# Patient Record
Sex: Male | Born: 1965 | Race: White | Hispanic: No | Marital: Married | Smoking: Never smoker
Health system: Southern US, Community
[De-identification: ages and names within clinical notes are randomized; demographics above are authoritative.]

## PROBLEM LIST (undated history)

## (undated) ENCOUNTER — Emergency Department (HOSPITAL_COMMUNITY): Discharge status: Absent without leave | Payer: Self-pay

## (undated) DIAGNOSIS — T4145XA Adverse effect of unspecified anesthetic, initial encounter: Secondary | ICD-10-CM

## (undated) DIAGNOSIS — J4540 Moderate persistent asthma, uncomplicated: Secondary | ICD-10-CM

## (undated) DIAGNOSIS — Z87442 Personal history of urinary calculi: Secondary | ICD-10-CM

## (undated) DIAGNOSIS — T8859XA Other complications of anesthesia, initial encounter: Secondary | ICD-10-CM

## (undated) DIAGNOSIS — J309 Allergic rhinitis, unspecified: Secondary | ICD-10-CM

## (undated) DIAGNOSIS — H409 Unspecified glaucoma: Secondary | ICD-10-CM

## (undated) DIAGNOSIS — G4733 Obstructive sleep apnea (adult) (pediatric): Secondary | ICD-10-CM

## (undated) DIAGNOSIS — J449 Chronic obstructive pulmonary disease, unspecified: Secondary | ICD-10-CM

## (undated) DIAGNOSIS — T7840XA Allergy, unspecified, initial encounter: Secondary | ICD-10-CM

## (undated) DIAGNOSIS — N201 Calculus of ureter: Secondary | ICD-10-CM

## (undated) DIAGNOSIS — T783XXA Angioneurotic edema, initial encounter: Secondary | ICD-10-CM

## (undated) DIAGNOSIS — M1990 Unspecified osteoarthritis, unspecified site: Secondary | ICD-10-CM

## (undated) DIAGNOSIS — L509 Urticaria, unspecified: Secondary | ICD-10-CM

## (undated) DIAGNOSIS — C801 Malignant (primary) neoplasm, unspecified: Secondary | ICD-10-CM

## (undated) DIAGNOSIS — R7303 Prediabetes: Secondary | ICD-10-CM

## (undated) DIAGNOSIS — Z8709 Personal history of other diseases of the respiratory system: Secondary | ICD-10-CM

## (undated) HISTORY — DX: Malignant (primary) neoplasm, unspecified: C80.1

## (undated) HISTORY — DX: Urticaria, unspecified: L50.9

## (undated) HISTORY — DX: Allergy, unspecified, initial encounter: T78.40XA

## (undated) HISTORY — DX: Angioneurotic edema, initial encounter: T78.3XXA

## (undated) HISTORY — DX: Unspecified glaucoma: H40.9

## (undated) HISTORY — PX: SINOSCOPY: SHX187

---
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
## 1992-07-22 DIAGNOSIS — J189 Pneumonia, unspecified organism: Secondary | ICD-10-CM

## 1992-07-22 HISTORY — PX: WISDOM TOOTH EXTRACTION: SHX21

## 1992-07-22 HISTORY — DX: Pneumonia, unspecified organism: J18.9

## 2002-03-09 ENCOUNTER — Emergency Department (HOSPITAL_COMMUNITY): Admission: EM | Admit: 2002-03-09 | Discharge: 2002-03-09 | Payer: Self-pay | Admitting: Emergency Medicine

## 2002-03-09 ENCOUNTER — Encounter: Payer: Self-pay | Admitting: Emergency Medicine

## 2002-03-10 ENCOUNTER — Ambulatory Visit (HOSPITAL_BASED_OUTPATIENT_CLINIC_OR_DEPARTMENT_OTHER): Admission: RE | Admit: 2002-03-10 | Discharge: 2002-03-10 | Payer: Self-pay | Admitting: Urology

## 2002-03-10 HISTORY — PX: OTHER SURGICAL HISTORY: SHX169

## 2002-03-18 ENCOUNTER — Encounter: Payer: Self-pay | Admitting: Urology

## 2002-03-18 ENCOUNTER — Ambulatory Visit (HOSPITAL_BASED_OUTPATIENT_CLINIC_OR_DEPARTMENT_OTHER): Admission: RE | Admit: 2002-03-18 | Discharge: 2002-03-18 | Payer: Self-pay | Admitting: Urology

## 2004-07-22 HISTORY — PX: EXTRACORPOREAL SHOCK WAVE LITHOTRIPSY: SHX1557

## 2004-10-02 ENCOUNTER — Ambulatory Visit: Payer: Self-pay | Admitting: Internal Medicine

## 2004-10-04 ENCOUNTER — Ambulatory Visit (HOSPITAL_COMMUNITY): Admission: RE | Admit: 2004-10-04 | Discharge: 2004-10-04 | Payer: Self-pay | Admitting: Urology

## 2004-10-09 ENCOUNTER — Emergency Department (HOSPITAL_COMMUNITY): Admission: EM | Admit: 2004-10-09 | Discharge: 2004-10-09 | Payer: Self-pay | Admitting: Emergency Medicine

## 2005-11-11 ENCOUNTER — Ambulatory Visit: Payer: Self-pay | Admitting: Internal Medicine

## 2006-05-31 ENCOUNTER — Emergency Department (HOSPITAL_COMMUNITY): Admission: EM | Admit: 2006-05-31 | Discharge: 2006-05-31 | Payer: Self-pay | Admitting: Emergency Medicine

## 2007-02-19 ENCOUNTER — Ambulatory Visit: Payer: Self-pay | Admitting: Internal Medicine

## 2007-03-06 ENCOUNTER — Ambulatory Visit: Payer: Self-pay | Admitting: Internal Medicine

## 2007-03-06 LAB — PULMONARY FUNCTION TEST

## 2007-07-13 ENCOUNTER — Telehealth (INDEPENDENT_AMBULATORY_CARE_PROVIDER_SITE_OTHER): Payer: Self-pay | Admitting: *Deleted

## 2007-08-24 ENCOUNTER — Telehealth: Payer: Self-pay | Admitting: Internal Medicine

## 2007-08-25 DIAGNOSIS — H1045 Other chronic allergic conjunctivitis: Secondary | ICD-10-CM | POA: Insufficient documentation

## 2007-08-25 DIAGNOSIS — J3089 Other allergic rhinitis: Secondary | ICD-10-CM | POA: Insufficient documentation

## 2007-08-25 DIAGNOSIS — J449 Chronic obstructive pulmonary disease, unspecified: Secondary | ICD-10-CM | POA: Insufficient documentation

## 2007-08-25 DIAGNOSIS — J302 Other seasonal allergic rhinitis: Secondary | ICD-10-CM

## 2007-11-25 ENCOUNTER — Ambulatory Visit: Payer: Self-pay | Admitting: Internal Medicine

## 2008-03-30 ENCOUNTER — Telehealth: Payer: Self-pay | Admitting: Internal Medicine

## 2008-03-30 ENCOUNTER — Ambulatory Visit: Payer: Self-pay | Admitting: Internal Medicine

## 2008-03-30 LAB — CONVERTED CEMR LAB
BUN: 17 mg/dL (ref 6–23)
Basophils Absolute: 0.1 10*3/uL (ref 0.0–0.1)
Basophils Relative: 0.8 % (ref 0.0–3.0)
CO2: 30 meq/L (ref 19–32)
Calcium: 9.2 mg/dL (ref 8.4–10.5)
Chloride: 109 meq/L (ref 96–112)
Creatinine, Ser: 1.1 mg/dL (ref 0.4–1.5)
Eosinophils Absolute: 0.3 10*3/uL (ref 0.0–0.7)
Eosinophils Relative: 4 % (ref 0.0–5.0)
GFR calc Af Amer: 94 mL/min
GFR calc non Af Amer: 78 mL/min
Glucose, Bld: 103 mg/dL — ABNORMAL HIGH (ref 70–99)
HCT: 46.3 % (ref 39.0–52.0)
Hemoglobin: 15.7 g/dL (ref 13.0–17.0)
Lymphocytes Relative: 40.6 % (ref 12.0–46.0)
MCHC: 33.8 g/dL (ref 30.0–36.0)
MCV: 87.6 fL (ref 78.0–100.0)
Monocytes Absolute: 0.8 10*3/uL (ref 0.1–1.0)
Monocytes Relative: 10 % (ref 3.0–12.0)
Neutro Abs: 3.8 10*3/uL (ref 1.4–7.7)
Neutrophils Relative %: 44.6 % (ref 43.0–77.0)
Platelets: 254 10*3/uL (ref 150–400)
Potassium: 4 meq/L (ref 3.5–5.1)
Pro B Natriuretic peptide (BNP): 9 pg/mL (ref 0.0–100.0)
RBC: 5.28 M/uL (ref 4.22–5.81)
RDW: 13.2 % (ref 11.5–14.6)
Sodium: 142 meq/L (ref 135–145)
WBC: 8.4 10*3/uL (ref 4.5–10.5)

## 2008-09-12 ENCOUNTER — Telehealth (INDEPENDENT_AMBULATORY_CARE_PROVIDER_SITE_OTHER): Payer: Self-pay | Admitting: *Deleted

## 2008-12-30 ENCOUNTER — Telehealth (INDEPENDENT_AMBULATORY_CARE_PROVIDER_SITE_OTHER): Payer: Self-pay | Admitting: *Deleted

## 2009-02-02 ENCOUNTER — Ambulatory Visit: Payer: Self-pay | Admitting: Internal Medicine

## 2009-08-11 ENCOUNTER — Telehealth (INDEPENDENT_AMBULATORY_CARE_PROVIDER_SITE_OTHER): Payer: Self-pay | Admitting: *Deleted

## 2009-09-12 ENCOUNTER — Ambulatory Visit: Payer: Self-pay | Admitting: Internal Medicine

## 2009-09-20 LAB — CONVERTED CEMR LAB
Basophils Absolute: 0.1 10*3/uL (ref 0.0–0.1)
Basophils Relative: 0.9 % (ref 0.0–3.0)
Eosinophils Absolute: 0.8 10*3/uL — ABNORMAL HIGH (ref 0.0–0.7)
Hemoglobin: 15.8 g/dL (ref 13.0–17.0)
MCHC: 33 g/dL (ref 30.0–36.0)
MCV: 90.4 fL (ref 78.0–100.0)
Monocytes Absolute: 0.8 10*3/uL (ref 0.1–1.0)
Neutro Abs: 4.6 10*3/uL (ref 1.4–7.7)
Neutrophils Relative %: 50.8 % (ref 43.0–77.0)
RBC: 5.31 M/uL (ref 4.22–5.81)
RDW: 13.3 % (ref 11.5–14.6)

## 2009-10-12 ENCOUNTER — Ambulatory Visit: Payer: Self-pay | Admitting: Internal Medicine

## 2009-11-23 ENCOUNTER — Ambulatory Visit: Payer: Self-pay | Admitting: Cardiology

## 2009-11-23 ENCOUNTER — Ambulatory Visit: Payer: Self-pay | Admitting: Internal Medicine

## 2009-11-24 ENCOUNTER — Telehealth: Payer: Self-pay | Admitting: Internal Medicine

## 2009-11-24 LAB — CONVERTED CEMR LAB
Basophils Absolute: 0.1 10*3/uL (ref 0.0–0.1)
Eosinophils Absolute: 0.7 10*3/uL (ref 0.0–0.7)
IgE (Immunoglobulin E), Serum: 112.1 intl units/mL (ref 0.0–180.0)
Lymphocytes Relative: 24.7 % (ref 12.0–46.0)
MCHC: 33.8 g/dL (ref 30.0–36.0)
Monocytes Relative: 10 % (ref 3.0–12.0)
Neutrophils Relative %: 57.6 % (ref 43.0–77.0)
RDW: 14.7 % — ABNORMAL HIGH (ref 11.5–14.6)

## 2009-12-04 ENCOUNTER — Telehealth: Payer: Self-pay | Admitting: Internal Medicine

## 2009-12-26 ENCOUNTER — Ambulatory Visit: Payer: Self-pay | Admitting: Internal Medicine

## 2009-12-26 LAB — CONVERTED CEMR LAB: IgE (Immunoglobulin E), Serum: 118.7 intl units/mL (ref 0.0–180.0)

## 2010-01-04 ENCOUNTER — Encounter: Payer: Self-pay | Admitting: Internal Medicine

## 2010-02-19 ENCOUNTER — Telehealth (INDEPENDENT_AMBULATORY_CARE_PROVIDER_SITE_OTHER): Payer: Self-pay | Admitting: *Deleted

## 2010-02-21 ENCOUNTER — Encounter: Payer: Self-pay | Admitting: Internal Medicine

## 2010-08-17 ENCOUNTER — Encounter: Payer: Self-pay | Admitting: Internal Medicine

## 2010-08-23 NOTE — Progress Notes (Signed)
Summary: Cefdinir fo sinusitis  Phone Note Outgoing Call   Summary of Call: Based on CT sinus, i am adding cefdinir to med list Initial call taken by: Waymon Budge MD,  Nov 24, 2009 1:26 PM    New/Updated Medications: CEFDINIR 300 MG CAPS (CEFDINIR) 2 daily x 10 days Prescriptions: CEFDINIR 300 MG CAPS (CEFDINIR) 2 daily x 10 days  #20 x 0   Entered and Authorized by:   Waymon Budge MD   Signed by:   Waymon Budge MD on 11/24/2009   Method used:   Historical   RxID:   [id number]   Appended Document: Cefdinir fo sinusitis    Clinical Lists Changes  Medications: Changed medication from CEFDINIR 300 MG CAPS (CEFDINIR) 2 daily x 10 days to CEFDINIR 300 MG CAPS (CEFDINIR) 2 daily x 10 days - Signed Rx of CEFDINIR 300 MG CAPS (CEFDINIR) 2 daily x 10 days;  #20 x 0;  Signed;  Entered by: Randell Loop CMA;  Authorized by: Waymon Budge MD;  Method used: [facility] [phone]*, [address] [place], [place], [place]  [address], Ph: [phone], Fax: [phone]    Prescriptions: CEFDINIR 300 MG CAPS (CEFDINIR) 2 daily x 10 days  #20 x 0   Entered by:   Randell Loop CMA   Authorized by:   Waymon Budge MD   Signed by:   Randell Loop CMA on 11/24/2009   Method used:   [facility] to        [facility] [address] [phone]* (retail)       [address]       [place], [place]  [address]       Ph: [phone]       Fax: [phone]   RxID:   [id number]

## 2010-08-23 NOTE — Progress Notes (Signed)
Summary: sob- prednisone  Phone Note Call from Patient Call back at Work Phone [phone]   Caller: Patient Call For: young Reason for Call: Talk to Nurse Summary of Call: pt says he is having a lot of difficulty breathing in the last 2 weeks.  CDY took him off some of his meds and now he's using inhalers more often.  Would like nurse to call him back to discuss. Initial call taken by: Eugene Gavia,  Dec 04, 2009 4:39 PM  Follow-up for Phone Call        The pt says he finished the Cefdinir on 12/03/2009. He is taking the Theophylline 2 two times a day. He is using the Proair daily and has already had to use 3 times today. Increased SOB, wheezing and increased cough with yellow mucus production. Has not used nebulizer. Please advise.  Cell # [id number] Allergies (verified):  1)  ! * Advair Follow-up by: Michel Bickers CMA,  Dec 04, 2009 4:52 PM  Additional Follow-up for Phone Call Additional follow up Details #1::        Appt Scheduled Today    Additional Follow-up for Phone Call Additional follow up Details #2::    I called. he is having wheeze, chest tightness a nd cough. Tolerating theophylline two times a day. No chest pain, but cough hurting his back. Denies any swelling, cramps, tightness or leg discomfort.  Coughs up occasional "chunk of yellow". We discussed CT evidence of chronic sinusitis, normal WBC, increased Eos. Consider allergic fungal sinusitis. Will put him back on prednisone til next visit, and increase theophylline to 100 mg three times a day. Ok to use nebulizer if needed. sent script prednisone 10 mg, # 50, 2 daily til feeling better, then one daily til next ov. Follow-up by: Waymon Budge MD,  Dec 04, 2009 5:31 PM  New/Updated Medications: PREDNISONE 10 MG TABS (PREDNISONE) 2 daily until feeling better, then one daily Prescriptions: PREDNISONE 10 MG TABS (PREDNISONE) 2 daily until feeling better, then one daily  #50 x 0   Entered and Authorized by:   Waymon Budge MD   Signed by:   Waymon Budge MD on 12/04/2009   Method used:   [facility]        [facility] [address] [phone]* (retail)       [address]       [place], [place]  [address]       Ph: [phone]       Fax: [phone]   RxID:   [id number]

## 2010-08-23 NOTE — Assessment & Plan Note (Signed)
Summary: 1 month/apc   Primary Provider/Referring Provider:  Manson [facility] FP  CC:  1 month follow up visit.  History of Present Illness: September 12, 2009- Allergic conjunctivitis, allergic rhinitis, asthma Increased cough and wheeze since Thanksgiving. Dyspnea on stairs. Mid-sternal tightness, with no pain except right back sore from coughing., Took Z pak after Thanksgiving. We sent augmentin 3 weeks ago that seemed to help temporarily. No fever. Some dark yellow but phleg m mostly lighter.No edema. Has continued Symbicort. Proair doesn't help much. Used father's nebulizer with albuterol twice daily- gives some relief.Few cramps in toes but no heaviness, calf pain. Refers to significant family stress.  October 12, 2009- Allergic rhinitis/ conjunctivitis, asthma Remains hoarse since Thanksgiving. Treatment here last time made him much better, resolving anosmia. Benefit only lasted 2 weeks and then chest congestion and anosmia have been gradually returnng. Cough plugs and phlegm- yellow now after geener last week. Cough keeps him awake.. Not much sneeze, nasal congestion Denies fever, glands. Throat sore from drainage. Minds hoarseness- wants to sing. This didn't improve when he was feeling better. Denies reflux/ heartburn.   Nov 23, 2009- Allergic rhinoconjnctivitis, asthma Cough is less productive, still some light yellow.  Voice quality is better. Prednisone helped- aware of weight gain- last dose taken a week ago. it also improved his sense of taste and smell.. Pollen bothers his chest, but denies more than usual amount of sneeze, rhinorhea, itching. Not aware of increased sinus drip, or of any reflux. Symbicort throat irritation was discussed- he says without it at night he wheezes and can't sleep.  Current Medications (verified): 1)  Singulair 10 Mg  Tabs (Montelukast Sodium) .... Take 1 Tablet By Mouth Once A Day 2)  Flonase 50 Mcg/act  Susp (Fluticasone Propionate) .[name].. 1 Spray Each Nostirl  Once Daily 3)  Proair Hfa 108 (90 Base) Mcg/act  Aers (Albuterol Sulfate) .... 2 Puffs Four Times A Day As Needed 4)  Symbicort 160-4.5 Mcg/act [name] (Budesonide-Formoterol Fumarate) .[name].. 1 Puff and Rinse, Daily 5)  Albuterol Sulfate (2.5 Mg/11ml) 0.083% Nebu (Albuterol Sulfate) .... Two Times A Day  Allergies (verified): 1)  ! * Advair  Past History:  Past Medical History: Last updated: 02/02/2009 ALLERGIC CONJUNCTIVITIS (ICD-372.14) ASTHMA, INTERMITTENT, MILD (ICD-493.90) ALLERGIC RHINITIS (ICD-477.9)  Kidney Stone  Past Surgical History: Last updated: 02/02/2009 Wisdom teeth lithotripsy  Family History: Last updated: 09/12/2009 Parents living Father- asthma/ bronchits  Social History: Last updated: 02/02/2009 Patient never smoked.  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  The patient denies anorexia, fever, weight loss, weight gain, vision loss, decreased hearing, hoarseness, chest pain, syncope, peripheral edema, headaches, hemoptysis, abdominal pain, and severe indigestion/heartburn.    Vital Signs:  Patient profile:   45 year old male Height:      70 inches Weight:      259 pounds BMI:     37.30 O2 Sat:      95 % on Room air Pulse rate:   95 / minute BP sitting:   138 / 82  (left arm) Cuff size:   large  Vitals Entered By: Reynaldo Minium CMA (Nov 23, 2009 10:38 AM)  O2 Flow:  Room air  Physical Exam  Additional Exam:  General: A/Ox3; pleasant and cooperative, NAD, obese SKIN: no rash, lesions NODES: no lymphadenopathy HEENT: [place]/AT, EOM-  WNL, Conjuctivae- clear, PERRLA, TM-WNL, Nose- clear, narrow, Throat- clear and wnl, residual tonsils, Mallampati  IV NECK: Supple w/ fair ROM, JVD- none, normal carotid impulses w/o bruits Thyroid-, hoarse without stridor. CHEST: Still some wheeze. HEART: RRR, no m/g/r heard ABDOMEN:  Soft and nl; FIE:PPIR, nl pulses, no edema , NEURO: Grossly intact to observation      Impression & Recommendations:  Problem # 1:  ASTHMA, INTERMITTENT, MILD (ICD-493.90) He is dealing now with a moderate persistent asthma. i question if he will need to go back on allergy vaccine, consider Xolair, and whether a chronic sinusitis may be contributory. We will try to be steroid-spairing with a trial of theophylline.  Problem # 2:  ? of SINUSITIS, CHRONIC (ICD-473.9) There is background of allergic rhinitis, but this is different. I suspect sinusitis which may also be sustaining lower respiratory inflammation.  Medications Added to Medication List This Visit: 1)  Theophylline Cr 100 Mg Xr12h-tab (Theophylline) .[name].. 1 two times a day with meals x 1 week, then 2 two times a day  Other Orders: Est. Patient Level III (51884) TLB-CBC Platelet - w/Differential (85025-CBCD) T-IgE (Immunoglobulin E) (16606-30160) Radiology Referral (Radiology)  Patient Instructions: 1)  Please schedule a follow-up appointment in 1 month. 2)  lab 3)  A Limited Sinus CT has been recommended.  Your imaging study may require preauthorization.  4)  Script for theophylline sent to your drug store  Prescriptions: THEOPHYLLINE CR 100 MG XR12H-TAB (THEOPHYLLINE) 1 two times a day with meals x 1 week, then 2 two times a day  #50 x prn   Entered and Authorized by:   Waymon Budge MD   Signed by:   Waymon Budge MD on 11/23/2009   Method used:   [facility]        [facility] [address] [phone][facility] (retail)       [address]       [place], [place]  [address]       Ph: [phone]       Fax: [phone]   RxID:   [id number]

## 2010-08-23 NOTE — Assessment & Plan Note (Signed)
Summary: 1 month/apc   Primary Provider/Referring Provider:  Manson [facility] FP  CC:  1 month follow up visit-still having bronchitis issues..  History of Present Illness: 02/02/09- Allergic conjunctiviitis, allergic rhinitis, asthma C/O increased sneezing, nasal congestion x several weeks. Hasn't been bothering his chest.  Usually Symbicort once daily is enough- it does help.[name] He will wheeze lying right side down if he skips Symbicort. Used dimetapp in spring- constipating. Mows 3 acres of grass.  September 12, 2009- Allergic conjunctivitis, allergic rhinitis, asthma Increased cough and wheeze since Thanksgiving. Dyspnea on stairs. Mid-sternal tightness, with no pain except right back sore from coughing., Took Z pak after Thanksgiving. We sent augmentin 3 weeks ago that seemed to help temporarily. No fever. Some dark yellow but phleg m mostly lighter.No edema. Has continued Symbicort. Proair doesn't help much. Used father's nebulizer with albuterol twice daily- gives some relief.Few cramps in toes but no heaviness, calf pain. Refers to significant family stress.  October 12, 2009- Allergic rhinitis/ conjunctivitis, asthma Remains hoarse since Thanksgiving. Treatment here last time made him much better, resolving anosmia. Benefit only lasted 2 weeks and then chest congestion and anosmia have been gradually returnng. Cough plugs and phlegm- yellow now after geener last week. Cough keeps him awake.. Not much sneeze, nasal congestion Denies fever, glands. Throat sore from drainage. Minds hoarseness- wants to sing. This didn't improve when he was feeling better. Denies reflux/ heartburn.      Current Medications (verified): 1)  Singulair 10 Mg  Tabs (Montelukast Sodium) .... Take 1 Tablet By Mouth Once A Day 2)  Flonase 50 Mcg/act  Susp (Fluticasone Propionate) .[name].. 1 Spray Each Nostirl Once Daily 3)  Proair Hfa 108 (90 Base) Mcg/act  Aers (Albuterol Sulfate) .... 2 Puffs Four Times A Day As Needed 4)   Symbicort 160-4.5 Mcg/act [name] (Budesonide-Formoterol Fumarate) .[name].. 1 Puff and Rinse, Daily 5)  Albuterol Sulfate (2.5 Mg/19ml) 0.083% Nebu (Albuterol Sulfate) .... Two Times A Day 6)  Nighttime Cold and Flu .... As Directed On Box  Allergies (verified): 1)  ! * Advair  Past History:  Past Medical History: Last updated: 02/02/2009 ALLERGIC CONJUNCTIVITIS (ICD-372.14) ASTHMA, INTERMITTENT, MILD (ICD-493.90) ALLERGIC RHINITIS (ICD-477.9)  Kidney Stone  Past Surgical History: Last updated: 02/02/2009 Wisdom teeth lithotripsy  Family History: Last updated: 09/12/2009 Parents living Father- asthma/ bronchits  Social History: Last updated: 02/02/2009 Patient never smoked.  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  The patient denies anorexia, fever, weight loss, weight gain, vision loss, decreased hearing, hoarseness, chest pain, syncope, dyspnea on exertion, peripheral edema, headaches, hemoptysis, abdominal pain, and severe indigestion/heartburn.    Vital Signs:  Patient profile:   45 year old male Height:      70 inches Weight:      263 pounds BMI:     37.87 O2 Sat:      94 % on Room air Pulse rate:   88 / minute BP sitting:   122 / 84  (left arm) Cuff size:   large  Vitals Entered By: Reynaldo Minium CMA (October 12, 2009 10:24 AM)  O2 Flow:  Room air  Physical Exam  Additional Exam:  General: A/Ox3; pleasant and cooperative, NAD, obese SKIN: no rash, lesions NODES: no lymphadenopathy HEENT: [place]/AT, EOM- WNL, Conjuctivae- clear, PERRLA, TM-WNL, Nose- clear, narrow, Throat- clear and wnl, residual tonsils, Mallampati  IV NECK: Supple w/ fair ROM, JVD- none, normal carotid impulses w/o bruits Thyroid-, hoarse without stridor. CHEST: Unlabored bilateral coarse wheeze without dullness or rub. 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 I think the hoarseness is from coughing- don't see yeast. We will use an extended course of prednisone. If voice doesn't improve we will send to ENT  Medications Added to Medication List This Visit: 1)  Prednisone 10 Mg Tabs (Prednisone) .... 2 x 7 days then one daily  Other Orders: Est. Patient Level III ([address])  Patient Instructions: 1)  Please schedule a follow-up appointment in 1 month. 2)  Try otc antihistamine of choice- Allegra and claritin are not sedating. 3)  Prednisone scrip.  4)  If no better we will look at ENT referral. 5)  We can consider allergy management again if needed. Prescriptions: PREDNISONE 10 MG TABS (PREDNISONE) 2 x 7 days then one daily  #50 x 0   Entered and Authorized by:   Waymon Budge MD   Signed by:   Waymon Budge MD on 10/12/2009   Method used:   Print then Give to Patient   RxID:   [id number]

## 2010-08-23 NOTE — Assessment & Plan Note (Signed)
Summary: ASTHMA/ MBW   Primary Provider/Referring Provider:  Olena [facility] FP  CC:  Acute Visit.  c/o chest congestion, increased SOB with exertion, wheezing, chest tightness, and and cough with dark yellow phelgm.  states these symptoms have been going on since Thanksgiving.  Was given zpak by PCP in Dec for these symptoms but it did not help.  Finished augmentin that was given by CY on 08/11/09-helped for approx 1 wk but then symptoms returned.  [name]  History of Present Illness:  03/30/08-ALLERGIC CONJUNCTIVITIS (ICD-372.14) ASTHMA, INTERMITTENT, MILD (ICD-493.90) ALLERGIC RHINITIS (ICD-51.10) 45 year old nonsmoker returning for follow-up of allergic rhinitis and asthma.  Persistent wheezing.  This summer with shortness of breath climbing steps.  Feels more smothered lying on his left side to sleep with increased wheeze, it in that position.  Chest feels heavy.  Denies chest pain, palpitation, bloody or purulent sputum, fever, sweats, or edema.  Advair helped for about two weeks, but then he associated with jaw pain.  It did help his breathing.  He stopped it and restarted, again, experiencing pain in the TM joints.  His wife is out of work and he is stressed, eating more and gaining weight.  02/02/09- Allergic conjunctiviitis, allergic rhinitis, asthma C/O increased sneezing, nasal congestion x several weeks. Hasn't been bothering his chest.  Usually Symbicort once daily is enough- it does help.[name] He will wheeze lying right side down if he skips Symbicort. Used dimetapp in spring- constipating. Mows 3 acres of grass.  September 12, 2009- Allergic conjunctivitis, allergic rhinitis, asthma Increased cough and wheeze since Thanksgiving. Dyspnea on stairs. Mid-sternal tightness, with no pain except right back sore from coughing., Took Z pak after Thanksgiving. We sent augmentin 3 weeks ago that seemed to help temporarily. No fever. Some dark yellow but phleg m mostly lighter.No edema. Has continued  Symbicort. Proair doesn't help much. Used father's nebulizer with albuterol twice daily- gives some relief.Few cramps in toes but no heaviness, calf pain. Refers to significant family stress.   Current Medications (verified): 1)  Singulair 10 Mg  Tabs (Montelukast Sodium) .... Take 1 Tablet By Mouth Once A Day 2)  Flonase 50 Mcg/act  Susp (Fluticasone Propionate) .[name].. 1 Spray Each Nostirl Once Daily 3)  Proair Hfa 108 (90 Base) Mcg/act  Aers (Albuterol Sulfate) .... 2 Puffs Four Times A Day As Needed 4)  Symbicort 160-4.5 Mcg/act [name] (Budesonide-Formoterol Fumarate) .[name].. 1 Puff and Rinse, Daily 5)  Albuterol Sulfate (2.5 Mg/50ml) 0.083% Nebu (Albuterol Sulfate) .... Two Times A Day 6)  Nighttime Cold and Flu .... As Directed On Box  Allergies (verified): 1)  ! * Advair  Past History:  Past Medical History: Last updated: 02/02/2009 ALLERGIC CONJUNCTIVITIS (ICD-372.14) ASTHMA, INTERMITTENT, MILD (ICD-493.90) ALLERGIC RHINITIS (ICD-477.9)  Kidney Stone  Past Surgical History: Last updated: 02/02/2009 Wisdom teeth lithotripsy  Family History: Last updated: 09/12/2009 Parents living Father- asthma/ bronchits  Social History: Last updated: 02/02/2009 Patient never smoked.  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  The patient denies anorexia, fever, weight loss, weight gain, vision loss, decreased hearing, hoarseness, chest pain, syncope, peripheral edema, headaches, hemoptysis, abdominal pain, and severe indigestion/heartburn.    Vital Signs:  Patient profile:   45 year old male Height:      70 inches Weight:  261.25 pounds BMI:     37.62 O2 Sat:      93 % on Room air Pulse rate:   97 / minute BP sitting:   132 / 76  (right arm) Cuff size:   large  Vitals Entered By:  Gweneth Dimitri RN (September 12, 2009 1:59 PM)  O2 Flow:  Room air CC: Acute Visit.  c/o chest congestion, increased SOB with exertion, wheezing, chest tightness, and cough with dark yellow phelgm.  states these symptoms have been going on since Thanksgiving.  Was given zpak by PCP in Dec for these symptoms but it did not help.  Finished augmentin that was given by CY on 08/11/09-helped for approx 1 wk but then symptoms returned.   Comments Medications reviewed with patient Daytime contact number verified with patient. Gweneth Dimitri RN  September 12, 2009 1:59 PM    Physical Exam  Additional Exam:  General: A/Ox3; pleasant and cooperative, NAD, obese SKIN: no rash, lesions NODES: no lymphadenopathy HEENT: [place]/AT, EOM- WNL, Conjuctivae- clear, PERRLA, TM-WNL, Nose- clear, narrow, Throat- clear and wnl, residual tonsils, Mallampati  III NECK: Supple w/ fair ROM, JVD- none, normal carotid impulses w/o bruits Thyroid- CHEST: Unlabored bilateral coarse wheeze without dullness or rub. HEART: RRR, no m/g/r heard ABDOMEN: Soft and nl; ZOX:WRUE, nl pulses, no edema , negative Homan's NEURO: Grossly intact to observation      Impression & Recommendations:  Problem # 1:  ASTHMA, INTERMITTENT, MILD (ICD-493.90) This is probably a sustained asthma exacerbation. We will get CxR and check D-dimer but I think PE less likely. Give neb xop with depo then pred taper.  Problem # 2:  ALLERGIC RHINITIS (ICD-477.9) Seasonal pollen counts are rising but I don't see obious acute worsening yet. His updated medication list for this problem includes:    Flonase 50 Mcg/act Susp (Fluticasone propionate) .[name]... 1 spray each nostirl once daily  Medications Added to Medication List This Visit: 1)  Albuterol Sulfate (2.5 Mg/33ml) 0.083% Nebu (Albuterol sulfate) .... Two times a day 2)  Nighttime Cold and Flu  .... As directed on box 3)  Prednisone 10 Mg Tabs (Prednisone) .[name].. 1 tab four times daily x 2 days, 3 times  daily x 2 days, 2 times daily x 2 days, 1 time daily x 2 days  Other Orders: Est. Patient Level III ([address]409) T-2 View CXR (71020TC) TLB-CBC Platelet - w/Differential (85025-CBCD) T-D-Dimer Fibrin Derivatives Quantitive (81191-47829) Depo- Medrol 80mg  (J1040) Admin of Therapeutic Inj  intramuscular or subcutaneous (56213)  Patient Instructions: 1)  Please schedule a follow-up appointment in 1 month. 2)  [facility] 3)  neb Xop 1.25 4)  depo 80 5)  lab Prescriptions: PREDNISONE 10 MG TABS (PREDNISONE) 1 tab four times daily x 2 days, 3 times daily x 2 days, 2 times daily x 2 days, 1 time daily x 2 days  #20 x 0   Entered and Authorized by:   Waymon Budge MD   Signed by:   Waymon Budge MD on 09/12/2009   Method used:   [facility] [phone][facility] (retail)       [address]       [place], [place]  [address]       Ph: [phone]       Fax: [phone]   RxID:   [id number]    Immunization  History:  Influenza Immunization History:    Influenza:  historical (04/21/2008)    Medication Administration  Injection # 1:    Medication: Depo- Medrol 80mg     Diagnosis: ASTHMA, INTERMITTENT, MILD (ICD-493.90)    Route: IM    Site: LUOQ gluteus    Exp Date: 04-2010    Lot #: [id number]    Mfr: teva    Patient tolerated injection without complications    Given by: Randell Loop CMA (September 12, 2009 2:29 PM)  Orders Added: 1)  Est. Patient Level III [54098] 2)  T-2 View CXR [71020TC] 3)  TLB-CBC Platelet - w/Differential [85025-CBCD] 4)  T-D-Dimer Fibrin Derivatives Quantitive [[id number]] 5)  Depo- Medrol 80mg  [J1040] 6)  Admin of Therapeutic Inj  intramuscular or subcutaneous [62130]

## 2010-08-23 NOTE — Consult Note (Signed)
Summary: [facility]  [facility]   Imported By: Sherian Rein 01/16/2010 11:22:25  _____________________________________________________________________  External Attachment:    Type:   Image     Comment:   External Document

## 2010-08-23 NOTE — Assessment & Plan Note (Signed)
Summary: 1 month/apc   Primary Provider/Referring Provider:  [facility]  CC:  Follow up visit- better since taking prednisone.[name]  History of Present Illness: October 12, 2009- Allergic rhinitis/ conjunctivitis, asthma Remains hoarse since Thanksgiving. Treatment here last time made him much better, resolving anosmia. Benefit only lasted 2 weeks and then chest congestion and anosmia have been gradually returnng. Cough plugs and phlegm- yellow now after geener last week. Cough keeps him awake.. Not much sneeze, nasal congestion Denies fever, glands. Throat sore from drainage. Minds hoarseness- wants to sing. This didn't improve when he was feeling better. Denies reflux/ heartburn.   Nov 23, 2009- Allergic rhinoconjunctivitis, asthma Cough is less productive, still some light yellow.  Voice quality is better. Prednisone helped- aware of weight gain- last dose taken a week ago. it also improved his sense of taste and smell.. Pollen bothers his chest, but denies more than usual amount of sneeze, rhinorhea, itching. Not aware of increased sinus drip, or of any reflux. Symbicort throat irritation was discussed- he says without it at night he wheezes and can't sleep.  December 26, 2009- Allergic rhinoconjunctivitis, asthma, Chronic sinusitis Voice still raspy and wheezes if tries to sleep on right side. Prednisone helped and it helps to maintain on 10 mg daily. He doubts that theophylline or cefdinir helped. Prednisone still helps sense of taste and smell. Still wakes some at night coughing but without obvious reflux. Avoids back sleeping because he snores. Separate bedrooms because they both snore. Has not had sleep study. Sleeping much better with prednisone. Without prednisone he had to use rescue inhaler heavily.  Asthma History    Asthma Control Assessment:    Age range: 12+ years    Symptoms: throughout the day    Nighttime Awakenings: 0-2/month    Interferes w/ normal activity: some  limitations    SABA use (not for EIB): >2 days/week    Asthma Control Assessment: Very Poorly Controlled   Preventive Screening-Counseling & Management  Alcohol-Tobacco     Smoking Status: never  Current Medications (verified): 1)  Singulair 10 Mg  Tabs (Montelukast Sodium) .... Take 1 Tablet By Mouth Once A Day 2)  Flonase 50 Mcg/act  Susp (Fluticasone Propionate) .[name].. 1 Spray Each Nostirl Once Daily 3)  Proair Hfa 108 (90 Base) Mcg/act  Aers (Albuterol Sulfate) .... 2 Puffs Four Times A Day As Needed 4)  Symbicort 160-4.5 Mcg/act [name] (Budesonide-Formoterol Fumarate) .[name].. 1 Puff and Rinse, Daily 5)  Albuterol Sulfate (2.5 Mg/19ml) 0.083% Nebu (Albuterol Sulfate) .... Two Times A Day  Allergies (verified): 1)  ! * Advair  Past History:  Past Medical History: Last updated: 02/02/2009 ALLERGIC CONJUNCTIVITIS (ICD-372.14) ASTHMA, INTERMITTENT, MILD (ICD-493.90) ALLERGIC RHINITIS (ICD-477.9)  Kidney Stone  Past Surgical History: Last updated: 02/02/2009 Wisdom teeth lithotripsy  Family History: Last updated: 09/12/2009 Parents living Father- asthma/ bronchits  Social History: Last updated: 02/02/2009 Patient never smoked.  [employer] home married  Risk Factors: Smoking Status: never (12/26/2009)  Review of Systems      See HPI       The patient complains of shortness of breath with activity and nasal congestion/difficulty breathing through nose.  The patient denies shortness of breath at rest, productive cough, non-productive cough, coughing up blood, chest pain, irregular heartbeats, acid heartburn, indigestion, loss of appetite, weight change, abdominal pain, difficulty swallowing, sore throat, tooth/dental problems, headaches, sneezing, itching, ear ache, rash, and fever.    Vital Signs:  Patient profile:   45 year old male  Height:      70 inches Weight:      255 pounds BMI:     36.72 O2 Sat:      95 % on Room air Pulse rate:   79 / minute BP sitting:   126 /  80  (left arm) Cuff size:   large  Vitals Entered By: Reynaldo Minium CMA (December 26, 2009 10:51 AM)  O2 Flow:  Room air CC: Follow up visit- better since taking prednisone.   Physical Exam  Additional Exam:  General: A/Ox3; pleasant and cooperative, NAD, obese SKIN: no rash, lesions NODES: no lymphadenopathy HEENT: [place]/AT, EOM- WNL, Conjuctivae- clear, PERRLA, TM-WNL, Nose- clear, narrow, Throat- clear and wnl, residual tonsils,  Mallampati  IV, hoarse NECK: Supple w/ fair ROM, JVD- none, normal carotid impulses w/o bruits Thyroid-, hoarse without stridor. CHEST: No wheeze or cough now HEART: RRR, no m/g/r heard ABDOMEN: Soft and nl; ZOX:WRUE, nl pulses, no edema , NEURO: Grossly intact to observation      Impression & Recommendations:  Problem # 1:  ? of SINUSITIS, CHRONIC (ICD-473.9) CT shows mucosal thichkening I will send him fo ENT evaluation. They may also see laryngeal evidence of reflux that he doesn't feel.  Problem # 2:  ASTHMA, PERSISTENT, MODERATE (ICD-493.90) Prednisone is suppressing this better. IgE was elevated. We will send Allergy profile panel and give info about Xolair.  Medications Added to Medication List This Visit: 1)  Prednisone 5 Mg Tabs (Prednisone) .[name].. 1-3 daily as needed  Other Orders: Est. Patient Level IV (45409) T-Allergy Profile [place], [place], [place], [place], [place] [id number]) ENT Referral (ENT)  Patient Instructions: 1)  Please schedule a follow-up appointment in 2 months. 2)  Continue prednisone at lowest dose that seems to hold you. 3)  Prednisone script refilled 4)  Info on Xolair for future consideration 5)  See [facility] for referral to ENT about chronic sinusitis 6)  lab 7)  Schedule PFT Prescriptions: PREDNISONE 5 MG TABS (PREDNISONE) 1-3 daily as needed  #100 x 1   Entered and Authorized by:   Waymon Budge MD   Signed by:   Waymon Budge MD on 12/26/2009   Method used:   [facility]  [address] [phone][facility] ([facility]ail[facility]        [address]       [place], [place]  [address]       Ph: [phone]       Fax: [phone]   RxID:   [id number]

## 2010-08-23 NOTE — Consult Note (Signed)
Summary: [facility]  [facility]   Imported By: Sherian Rein 03/02/2010 08:27:34  _____________________________________________________________________  External Attachment:    Type:   Image     Comment:   External Document

## 2010-08-23 NOTE — Progress Notes (Signed)
Summary: chest congestion  Phone Note Call from Patient Call back at Home Phone [phone] Call back at Work Phone [phone] Call back at [phone]   Caller: Patient Call For: young Summary of Call: Need prescription for chest congestion. Please advise. Initial call taken by: Darletta Moll,  August 11, 2009 10:51 AM  Follow-up for Phone Call        pt c/o chest congestion, productive cough with green phlegm, wheezing that has progressively gotten worse since thanksgiving. pt saw his PCP 1 month ago and was given a zpak, but states this gave him no relief.  Pt request abx, something for cough, and somthing to help him cough up the phlegm. He stated the phlegm is thick and hard to cough up. Please advise.Carron Curie CMA  August 11, 2009 11:06 AM allergies: advair  Additional Follow-up for Phone Call Additional follow up Details #1::        Make sure he is using his Symbicort twice daily Offer augmentin 875 mg, # 14, 1 two times a day, no ref. Suggest otc Mucinex -DM for cough and to thin mucus. If these don't work then i need to take a look at him. Additional Follow-up by: Waymon Budge MD,  August 11, 2009 11:34 AM    Additional Follow-up for Phone Call Additional follow up Details #2::    Spoke with the pt.  He states that  he has been, and will continue to use symbicort two times a day.  I advised that we will call in augmentin and also that he needs to take mucinex dm for cough/congestion.  Will come in for ov if not improving.  Follow-up by: Vernie Murders,  August 11, 2009 12:00 PM  New/Updated Medications: AUGMENTIN 875-125 MG TABS (AMOXICILLIN-POT CLAVULANATE) 1 by mouth two times a day Prescriptions: AUGMENTIN 875-125 MG TABS (AMOXICILLIN-POT CLAVULANATE) 1 by mouth two times a day  #14 x 0   Entered by:   Vernie Murders   Authorized by:   Waymon Budge MD   Signed by:   Vernie Murders on 08/11/2009   Method used:   [facility]        [facility] [address] [phone]* (retail)       [address]       [place], [place]  [address]       Ph: [phone] or [phone]       Fax: [phone]   RxID:   [id number]

## 2010-08-23 NOTE — Progress Notes (Signed)
[facility]  Phone Note Call from Patient Call back at Home Phone [phone]   Caller: Patient Call For: young Summary of Call: [facility] [address] call pt at home when this has been called in please Initial call taken by: Tivis Ringer, CNA,  February 19, 2010 10:04 AM  Follow-up for Phone Call        last OV w/ CDY 6.7.11 and told to follow up in 2 months.  pt has no upcoming appts.  will send rx w/ 2 refills. Boone Master CNA/MA  February 19, 2010 10:06 AM   called spoke with patient, advised of pending rx.  pt verbalized his understanding.  pt states he was sent to ENT by [facility], and found polyps in sinuses and poss deviated septum with possible surgery in the future.  pt states that if all this "fixes his breathing" he may not need to follow up here. Boone Master CNA/MA  February 19, 2010 10:10 AM     Prescriptions: Aleda Grana 50 MCG/ACT  SUSP (FLUTICASONE PROPIONATE) 1 spray each nostirl once daily  #1 x 2   Entered by:   Boone Master CNA/MA   Authorized by:   Waymon Budge MD   Signed by:   Boone Master CNA/MA on 02/19/2010   Method used:   [facility]  [address] [phone]* (retail)       [address]       [place], [place]  [address]       Ph: [phone]       Fax: [phone]   RxID:   [id number]

## 2010-08-27 ENCOUNTER — Encounter: Payer: Self-pay | Admitting: Internal Medicine

## 2010-08-29 NOTE — Letter (Signed)
Summary: Phone Gweneth Dimitri MD  Phone Gweneth Dimitri MD   Imported By: Sherian Rein 08/24/2010 09:24:11  _____________________________________________________________________  External Attachment:    Type:   Image     Comment:   External Document

## 2010-09-06 NOTE — Letter (Signed)
Summary: Flo Shanks MD/[facility]  Flo Shanks MD/[facility]   Imported By: Lester Experiment 08/31/2010 14:09:56  _____________________________________________________________________  External Attachment:    Type:   Image     Comment:   External Document

## 2010-09-14 ENCOUNTER — Encounter: Payer: Self-pay | Admitting: Internal Medicine

## 2010-09-20 ENCOUNTER — Other Ambulatory Visit (HOSPITAL_COMMUNITY): Payer: Self-pay | Admitting: Otolaryngology

## 2010-09-20 ENCOUNTER — Ambulatory Visit (HOSPITAL_COMMUNITY)
Admission: RE | Admit: 2010-09-20 | Discharge: 2010-09-20 | Disposition: A | Discharge status: Home / Self Care | Payer: BC Managed Care – PPO | Source: Ambulatory Visit | Attending: Otolaryngology | Admitting: Otolaryngology

## 2010-09-20 ENCOUNTER — Encounter (HOSPITAL_COMMUNITY)
Admission: RE | Admit: 2010-09-20 | Discharge: 2010-09-20 | Disposition: A | Discharge status: Home / Self Care | Payer: BC Managed Care – PPO | Source: Ambulatory Visit | Attending: Otolaryngology | Admitting: Otolaryngology

## 2010-09-20 DIAGNOSIS — J3489 Other specified disorders of nose and nasal sinuses: Secondary | ICD-10-CM | POA: Insufficient documentation

## 2010-09-20 DIAGNOSIS — Z01811 Encounter for preprocedural respiratory examination: Secondary | ICD-10-CM

## 2010-09-20 DIAGNOSIS — R0989 Other specified symptoms and signs involving the circulatory and respiratory systems: Secondary | ICD-10-CM | POA: Insufficient documentation

## 2010-09-20 DIAGNOSIS — Z01812 Encounter for preprocedural laboratory examination: Secondary | ICD-10-CM | POA: Insufficient documentation

## 2010-09-20 DIAGNOSIS — J45909 Unspecified asthma, uncomplicated: Secondary | ICD-10-CM | POA: Insufficient documentation

## 2010-09-20 DIAGNOSIS — Z01818 Encounter for other preprocedural examination: Secondary | ICD-10-CM | POA: Insufficient documentation

## 2010-09-20 LAB — BASIC METABOLIC PANEL
CO2: 28 mEq/L (ref 19–32)
Chloride: 102 mEq/L (ref 96–112)
GFR calc non Af Amer: >60 mL/min (ref >60)
Glucose, Bld: 102 mg/dL — ABNORMAL HIGH (ref 70–99)
Potassium: 4.6 mEq/L (ref 3.5–5.1)
Sodium: 140 mEq/L (ref 135–145)

## 2010-09-20 LAB — CBC
Platelets: 219 10*3/uL (ref 150–400)
RBC: 5.43 MIL/uL (ref 4.22–5.81)
RDW: 14.9 % (ref 11.5–15.5)
WBC: 12.1 10*3/uL — ABNORMAL HIGH (ref 4.0–10.5)

## 2010-09-24 ENCOUNTER — Other Ambulatory Visit: Payer: Self-pay | Admitting: Otolaryngology

## 2010-09-24 ENCOUNTER — Observation Stay (HOSPITAL_COMMUNITY): Payer: BC Managed Care – PPO

## 2010-09-24 ENCOUNTER — Observation Stay (HOSPITAL_COMMUNITY)
Admission: RE | Admit: 2010-09-24 | Discharge: 2010-09-25 | Disposition: A | Discharge status: Home / Self Care | Payer: BC Managed Care – PPO | Source: Ambulatory Visit | Attending: Otolaryngology | Admitting: Otolaryngology

## 2010-09-24 DIAGNOSIS — E669 Obesity, unspecified: Secondary | ICD-10-CM | POA: Insufficient documentation

## 2010-09-24 DIAGNOSIS — J328 Other chronic sinusitis: Principal | ICD-10-CM | POA: Insufficient documentation

## 2010-09-24 DIAGNOSIS — J343 Hypertrophy of nasal turbinates: Secondary | ICD-10-CM | POA: Insufficient documentation

## 2010-09-24 DIAGNOSIS — J342 Deviated nasal septum: Secondary | ICD-10-CM | POA: Insufficient documentation

## 2010-09-24 DIAGNOSIS — J338 Other polyp of sinus: Secondary | ICD-10-CM | POA: Insufficient documentation

## 2010-09-24 HISTORY — PX: SEPTOPLASTY WITH ETHMOIDECTOMY, AND MAXILLARY ANTROSTOMY: SHX6090

## 2010-09-27 NOTE — Consult Note (Signed)
Summary: [facility]  [facility]   Imported By: Sherian Rein 09/20/2010 08:29:01  _____________________________________________________________________  External Attachment:    Type:   Image     Comment:   External Document

## 2010-10-02 NOTE — Op Note (Signed)
[name] [name], [name] [name]             ACCOUNT NO.:  [id number]  MEDICAL RECORD NO.:  [id number]           PATIENT TYPE:  I  LOCATION:  2603                         FACILITY:  [facility]  PHYSICIAN:  [name] [name], M.D. DATE OF BIRTH:  [birth year]  DATE OF PROCEDURE:  09/24/2010 DATE OF DISCHARGE:                              OPERATIVE REPORT   PREOPERATIVE DIAGNOSES: 1. Chronic polypoid pansinusitis. 2. Deviated nasal septum. 3. Hypertrophic inferior turbinates.  POSTOPERATIVE DIAGNOSES: 1. Chronic polypoid pansinusitis. 2. Deviated nasal septum. 3. Hypertrophic inferior turbinates.  PROCEDURE PERFORMED: 1. Bilateral endoscopic:     a.     Total ethmoidectomy.     b.     Antrostomy.     c.     Nasal frontal recess. 2. Nasal septoplasty. 3. Bilateral SMR inferior turbinates. 4. Stealth apparatus.  SURGEON:  Gloris Manchester. Kriti Katayama, MD  ANESTHESIA:  General orotracheal.  BLOOD LOSS:  200 mL.  COMPLICATIONS:  None.  FINDINGS:  Fleshy polypoid mucosa throughout the paranasal sinuses.  No frank pus.  Polyps filling the middle meatus on both sides.  A moderately severely buckled leftward septal deviation with a prominent maxillary crest spur.  Very bulky inferior turbinates bilateral.  Nasal septum presenting out of the columella into the medial left nasal vestibule.  No evidence of violation of fovea ethmoidalis or lamina papyracea on either side.  PROCEDURE:  With the patient in a comfortable supine position, general orotracheal anesthesia was induced without difficulty.  At an appropriate level, the patient was turned 90 degrees towards the stealth apparatus and away from anesthesia.  The patient was placed in a slight sitting position.  A saline-moistened throat pack was placed.  Nasal vibrissae were trimmed.  Cocaine crystals, 200 mg total, were applied on cotton carriers to the anterior ethmoid and sphenopalatine ganglion regions on both sides.  Afrin solution was applied  1/2 x 3 inch cottonoids to both sides of the nasal septum.  Xylocaine 1% with 1:100,000 epinephrine was infiltrated into the submucoperichondrial plane of the high nasal septum on both sides.  Several minutes were allowed for this to take effect.  A sterile preparation and draping of the midface was accomplished.  The stealth apparatus was registered in the standard fashion.  The materials were removed from nose and observed to be intact and correct in number.  The findings were as described above.  Under direct vision, additional 1% Xylocaine with 1:100,000 epinephrine, 4 mL total, was infiltrated into the lateral wall of the nose anteriorly in the middle meatus, and into the inferior edge of the middle turbinate. Additional Afrin-moistened cotton pledgets were placed into the middle meatus in between the middle turbinate and the septum on both sides. Several minutes were allowed for this to take effect.  A specimen trap was placed in the suction.  Beginning on the left side, the materials were removed from the nose and observed to be intact and correct in number.  The findings were as described above.  The 0-degree endoscope was introduced into the nose. Using the power debrider, polyps in the middle meatus were removed.  The uncinate process was identified  and incised at its base with a sickle knife and then removed with the power debrider.  With a curved suction tip, the antrum was identified and the opening was enlarged anteriorly and posteriorly.  Cut mucosal edges were trimmed with a power debrider.  Using the antrostomy as the landmark for the lamina papyracea, straight suction tip was used to break through the anterior face of the bulla ethmoidalis.  Staying low and medial, partitions were broken down and then polypoid mucosa and bony partitions were removed with the power debrider up to the fovea and laterally to the lamina papyracea.  The stealth apparatus was used at  various places to identify the anatomy. The dissection was carried posteriorly low and medial preserving the horizontal lamella of the middle turbinate and back up to the rostrum of the sphenoid.  The superior turbinate was removed with the power debrider.  Some polypoid material on the medial surface of the middle turbinate was removed with power debrider.  At this point, the 30-degree endoscope was brought in and the 60-degree endoscopic debrider.  Using straight and curved suction tips, agger nasi cells were identified and opened and then the broken bony partitions and the polypoid mucosa was removed with the power debrider up into the nasal frontal recess.  A curved suction tip was used to penetrate into the frontal recess and the opening was enlarged using a giraffe forceps. Small polypoid material was removed from the frontal sinus in the same fashion.  Finally, the area was cleaned with the power debrider.  Using the stealth apparatus, one or two small agger nasi cells high and laterally were identified and opened with suction tip and then removed with the debrider.  At this point, the sinuses were adequately opened. The natural sphenoid ostium was not identified but it was not disturbed either.  There was no evidence of spinal fluid leak and no violation of the lamina papyracea.  This completed the left sinus procedure.  An Afrin-moistened pledget was placed into the middle meatus and attention was turned to the right side.  The specimen trap was switched.  Once again, under direct vision, the power debrider was used to remove middle meatus, polyps.  The uncinate process was incised and removed with the debrider.  The antrum was identified with the curved suction tip and opened forward and backwards with biting forceps.  The antrostomy was cleaned with a power debrider.  Using the straight suction, the anterior wall of the bulla ethmoidalis was opened and dissection was carried  low and medially back through the posterior ethmoid cells and then upward and laterally removing bony partitions and polypoid mucosa directly and with the power debrider. The superior turbinate was removed with the power debrider.  A large sphenoid ostium was identified and not further disturbed.  Working from posteriorly forward using the antrostomy as a landmark for the lamina papyracea, cells were removed laterally and then superiorly.  Upon approaching the anterior ethmoid, the 30-degree endoscope was used and additional cells of the agger nasi were broken down with suction tip and then removed with the power debrider.  Once again, the frontal recess was identified and opened.  There was minimal polypoid material on the right side, but the opening was enlarged adequately.  At this point, the right ethmoidectomy was completed as well as the antrostomy and frontal recess exploration.  No evidence of violation of lamina papyracea or fovea ethmoidalis was identified.  An Afrin-moistened pledget was placed.  The 0-degree endoscope  was used on the left side after removing the pledget, and once again complete exenteration was felt to have been accomplished and no evidence of any complications.  The right side was inspected yet again with similar findings.  This completed the sinus procedure on both sides.  Working under direct vision, additional 1% Xylocaine with 1:100,000 epinephrine was infiltrated into the anterior floor of the nose, into the membranous columella, and into the submucoperichondrial plane of the septum on both sides.  While waiting for this to take effect, a dissolving nasal pack was cut in half diagonally and used to pack both ethmoid blocks for postoperative hygiene and hemostasis.  Returning to the septum, a right-sided approach was elected.  Therefore, a right hemitransfixion incision was sharply executed and carried down to the floor of the nose.  A left floor  incision was executed.  Floor tunnels wall elevated on both sides, carried posteriorly, and then brought medially along the vomer and maxillary crest.  The submucoperichondrial plane of the right septum was elevated up to the dorsum of the nose.  It was very fragile in the buccal the area inferiorly and several low linear rents were generated.  The flap was carried up onto the perpendicular plate of the ethmoid, brought down and communicated with the floor tunnel and then brought forward.  The chondro-ethmoid junction was identified and opened with a Risk analyst.  The opposite submucoperiosteal plane of the septum was elevated.  There was heavy spurring along the maxillary crest into the left nose.  The superior perpendicular plate of the ethmoid was lysed with the Jansen-Middleton forceps, the inferior portion was dissected from the vomer, rocked free, and delivered with the closed Leane Para- Middleton forceps.  Bony spicules were carefully removed as well as the cartilaginous posterior tail of the quadrangular cartilage lying along the vomer.  A 2-mm strut was removed from the inferior quadrangular cartilage where it was heavily spurred.  This allowed the septum to trapdoor more into the midline.  With the septum displaced, the maxillary crest was reduced using mallet and osteotome.  The intercrural pocket was generated in the columella, and using a 4-0 PDS suture, the septum was tucked into the pocket.  The septum was additionally secured at the nasal spine with the same agent.  Finally, 4-0 PDS was used to narrow the columella at the level of the medial crura of the lower lateral cartilages.  In the course of doing this, the point of a needle approximately 2 mm in greatest extent was broken off and potentially lost in the tissue.  An x- ray will be obtained prior to leaving the operating room and we will deal with the results.  If the tip is embedded in the patient, I do  not think it will be worth the effort or cosmetic deficit to try to retrieve it.  It if is lost elsewhere, then this is inconsequential.  After completing the septoplasty with the septum straight and in the midline, the several incisions were closed with interrupted 4-0 chromic suture.  Just prior to completing the septoplasties, the inferior turbinates were infiltrated with additional 1% Xylocaine with 1:100,000 epinephrine, 4 mL total, using a 25-gauge spinal needle.  Beginning on the right side, the anterior hood of the inferior turbinate was sharply incised and the medial mucosa was also incised and a laterally based flap was developed.  The turbinate was then fractured. Using angled turbinate scissors, the turbinate bone, lateral mucosa, as well as some of the  bulbous anterior pole was resected in the posterior downsloping fashion taking virtually all the anterior pole and leaving virtually all of the posterior pole.  Submucosal dissection allowed removal of several additional bony spicules.  The flap was laid back down.  The cut mucosal edges were suction coagulated and the turbinate was outfractured.  This completed the left turbinate.  The right side was done in identical fashion.  After completing both turbinate reductions, 0.040 reinforced Silastic splints were applied to the nasal septum on both sides and secured thereto with a 3-0 nylon stitch.  A nasal trumpet was shortened to reach the nasopharynx and was placed one on each side in the low nose to allow some postoperative nasal respiration.  Finally, a double thickness Telfa pack was lubricated with bacitracin ointment and placed along the inferior turbinate on each side for hemostasis.  Hemostasis was in fact observed.  At this point, the procedure was completed.  The patient was further elevated and the pharynx was suctioned clear and the throat pack was removed.  A bite block was placed.  The patient was returned  to Anesthesia, awakened, extubated, and transferred to recovery in stable condition.  The Foley catheter in anticipation of a prolonged case was removed prior to leaving the operating room.     Gloris Manchester. Lazarus [name], M.D.     KTW/MEDQ  D:  09/24/2010  T:  09/25/2010  Job:  [id number]  cc:   Joni Fears D. Maple Hudson, MD, [facility], FACP  Electronically Signed by Flo Shanks M.D. on 10/02/2010 08:04:29 AM

## 2010-11-05 ENCOUNTER — Telehealth: Payer: Self-pay | Admitting: Internal Medicine

## 2010-11-05 MED ORDER — PREDNISONE 10 MG PO TABS: ORAL_TABLET | ORAL | Status: DC

## 2010-11-05 NOTE — Telephone Encounter (Signed)
Per CDY-appt in the next 2-3 weeks and give Prednisone 10mg  #20 take 4 x 2 days, 3 x 2 days, 2 x 2 days, 1 x 2 days, then stop.no refills.

## 2010-11-05 NOTE — Telephone Encounter (Signed)
Spoke w/ pt and he c/o chest congestion, cough w/ green to yellow phlem, increased sob. Pt states this has been going on for some time now and is just getting worse. Pt states he uses his rescue inhaler up to 6-7 times a day and is doing his symbicort 2 puffs bid. Pt was last seen 12/26/09 and was told to f/u in 2 months. Pt has an upcoming apt 12/14/10 but states he can't wait that long to be seen and would like to be worked in. Pt states if he can't be worked in if he can get prednisone called in to help him to be able to breathe. Pt states when he was last here in June Dr. Maple Hudson referred him to Dr. Lazarus Salines and 5 weeks ago pt had sinus surgery. Pt states he was taken off his prednisone and has been on several abx's that he finished last week. Please advise Dr. Maple Hudson. Thanks  Allergies  Allergen Reactions  . Advair Hfa     REACTION: jaw joint pain    Carver Fila, CMA

## 2010-11-05 NOTE — Telephone Encounter (Signed)
Spoke w/ pt and he will be coming in 4/27 at 2:45 and pt aware pred rx was sent to pharmacy. Nothing further was needed

## 2010-11-14 ENCOUNTER — Encounter: Payer: Self-pay | Admitting: Internal Medicine

## 2010-11-16 ENCOUNTER — Encounter: Payer: Self-pay | Admitting: *Deleted

## 2010-11-16 ENCOUNTER — Ambulatory Visit (INDEPENDENT_AMBULATORY_CARE_PROVIDER_SITE_OTHER): Payer: 59 | Admitting: Internal Medicine

## 2010-11-16 ENCOUNTER — Encounter: Payer: Self-pay | Admitting: Internal Medicine

## 2010-11-16 VITALS — BP 120/80 | HR 96 | Ht 70.0 in | Wt 265.6 lb

## 2010-11-16 DIAGNOSIS — J309 Allergic rhinitis, unspecified: Secondary | ICD-10-CM

## 2010-11-16 DIAGNOSIS — J45909 Unspecified asthma, uncomplicated: Secondary | ICD-10-CM

## 2010-11-16 DIAGNOSIS — J329 Chronic sinusitis, unspecified: Secondary | ICD-10-CM

## 2010-11-16 MED ORDER — FLUCONAZOLE 150 MG PO TABS: 150.0000 mg | ORAL_TABLET | Freq: Every day | ORAL | Status: DC

## 2010-11-16 MED ORDER — IPRATROPIUM-ALBUTEROL 0.5-2.5 (3) MG/3ML IN SOLN: 3.0000 mL | RESPIRATORY_TRACT | Status: DC | PRN

## 2010-11-16 NOTE — Progress Notes (Signed)
  Subjective:    Patient ID: [name] [name], male    DOB: 1965-11-03, 45 y.o.   MRN: [id number]  HPI 45 yoM followed for hx of asthma, rhinitis and chronic sinusitis. Last here December 26, 2009. Since then he had extensive sinus surgery and polypectomy by Dr Lazarus Salines 09/24/10, which did help nasal congestion. They noted the pollen issues this Spring. Slow prednisone taper ended 2 weeks ago. Off that he again got congested in chest, wheezing, short of breath interfering with sleep. We called short prednisone taper which has helped, but not enough. He continues Symbicort, Flonase, Singulair. Dr Lazarus Salines treated for thrush. Denies diabetes. He notes weight gain on prednisone, Skin test positive and on allergy vaccine in the past. 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 Review of Systems See HPI Constitutional:    NO- night sweats ,fevers, chills, fatigue, lassitude. HEENT:   No headaches,  Difficulty swallowing,  Tooth/dental problems,  Sore throat,                No sneezing, itching, ear ache,  CV:  No chest pain,  Orthopnea, PND, swelling in lower extremities, anasarca, dizziness, palpitations  GI  No heartburn, indigestion, abdominal pain, nausea, vomiting, diarrhea, change in bowel habits, loss of appetite  Resp:.  No excess mucus,   No coughing up of blood.  No change in color of mucus.   Skin: no rash or lesions.  GU: no dysuria, change in color of urine, no urgency or frequency.  No flank pain.  MS:  No joint pain or swelling.  No decreased range of motion.  No back pain.  Psych:   change in mood or affect.  depression and  anxiety.  No memory loss.       Objective:   Physical Exam General- Alert, Oriented, Affect-appropriate, Distress- none acute  obese  Skin- rash-none, lesions- none, excoriation- none  Lymphadenopathy- none  Head- atraumatic  Eyes- Gross vision intact, PERRLA, conjunctivae clear,  secretions  Ears- Hearing, canals, Tm L ,R ,  Nose- Clear, no-Septal dev, polyps, erosion, perforation.   White mucus bridging  Throat- Mallampati IV , mucosa clear , drainage- none visible , tonsils- atrophic    hoarse  Neck- flexible , trachea midline, no stridor , thyroid nl, carotid no bruit  Chest - symmetrical excursion , unlabored     Heart/CV- RRR , no murmur , no gallop  , no rub, nl s1 s2                     - JVD- none , edema- none, stasis changes- none, varices- none     Lung- , wheeze- bilateral, cough- none , dullness-none, rub- none     Chest wall-  Abd- tender-no, distended-no, bowel sounds-present, HSM- no  Br/ Gen/ Rectal- Not done, not indicated  Extrem- cyanosis- none, clubbing, none, atrophy- none, strength- nl  Neuro- grossly intact to observation         Assessment & Plan:

## 2010-11-16 NOTE — Patient Instructions (Signed)
Script sent for  Diflucan for yeast  Script sent for prednisone to use if you have to for now.  Sample Dulera 200-5   Use instead of Symbicort for trial      2 puffs and rinse, twice every day. Try using it before breakfast and supper so the eating process helps clear your mouth.  Sample Qnasl nasal spray to use instead of Flonase for trial    2 sprays each nostril once every day at bedtime  Script sent for combination nebulizer medicine to see if it works any better.    Order-- See [facility] to start the Xolair application process.

## 2010-11-16 NOTE — Assessment & Plan Note (Addendum)
We again discussed Xolair as a treatment option, risks and and will begin the application process. He likely will need to explore assistance for this. We discussed options. Will change his neb med from albuterol to albuterol plus ipratropium; change Symbicort to Dulera 200; change fluticasone to sample Qnasl, give script to hold for prednisone.

## 2010-11-18 ENCOUNTER — Encounter: Payer: Self-pay | Admitting: Internal Medicine

## 2010-11-18 DIAGNOSIS — J320 Chronic maxillary sinusitis: Secondary | ICD-10-CM | POA: Insufficient documentation

## 2010-11-18 NOTE — Assessment & Plan Note (Signed)
Skin test positive in the past. Now with a chronic sinusitis syndrome- inflammation may not mean infection. I don't know if he has been tried with nasal antibiotic maintenance therapy.

## 2010-11-28 ENCOUNTER — Telehealth: Payer: Self-pay | Admitting: Internal Medicine

## 2010-11-28 MED ORDER — MOMETASONE FUROATE 50 MCG/ACT NA SUSP: 2.0000 | Freq: Every day | NASAL | Status: DC

## 2010-11-28 MED ORDER — PREDNISONE 5 MG PO TABS: ORAL_TABLET | ORAL | Status: DC

## 2010-11-28 NOTE — Telephone Encounter (Signed)
lmomtcb x 1. Sent in Nasonex and refill for Prednisone.

## 2010-11-28 NOTE — Telephone Encounter (Signed)
Spoke w/ [name] [name] and he states he was giving a sample of nasonex at last OV 11/16/10 to try as a trial. [name] [name] states he feels it has helped w/ his nasal congestion and is wanting a rx called in for this. [name] [name] also states he needs a refill on his prednisone 5 mg. [name] [name] states Dr. Maple Hudson advised him when he ran out of his prednisone he would refill for [name] [name]. [name] [name] states he only has 2 tablets left. [name] [name] states when he takes 2-3 pred 5 mg tablets it helps his breathing. [name] [name] states he is not taking the prednisone everyday, just PRN. Please advise Dr. Maple Hudson. Thanks  Carver Fila, CMA

## 2010-11-28 NOTE — Telephone Encounter (Signed)
Per CY-okay to give Nasonex #1 2 puffs each nostril once daily with prn refills and Prednisone 5mg  #100 take 2-3 po daily prn no refills.

## 2010-11-29 NOTE — Telephone Encounter (Signed)
LMOM informing pt [facility] sent to [facility] [address]

## 2010-12-04 NOTE — Assessment & Plan Note (Signed)
[facility]                             PULMONARY OFFICE NOTE   NAME:Chopin, SAED HUDLOW                    MRN:          [id number]  DATE:02/19/2007                            DOB:          [birth year]    PROBLEMS:  1. Allergic rhinitis.  2. Asthma.  3. Allergic conjunctivitis.   HISTORY:  He has felt sick for the past month with head and chest  congestion. He was treated at [facility] with a Z-  Pak, prednisone, and cough syrup, but is still coughing up yellow. He  took a second antibiotic, but does not know the name. He tried Asmanex  for a month with no effect. He says that prednisone restored his sense  of smell and taste when that was used in the past. We discussed  treatment options.   MEDICATIONS:  1. Flonase.  2. Singulair.  3. Albuterol HFA.  4. Afrin occasionally.   ALLERGIES:  No medication allergy.   OBJECTIVE:  Weight 254 pounds, blood pressure 128/82, pulse 90, room air  saturation 97%. Quiet, clear, chest with normal heart sounds. No cough.  Mild nasal congestion.   IMPRESSION:  Bronchitis and rhinitis.   PLAN:  We renewed his Fluticasone nasal spray and albuterol HFA  inhalers. Today, he was given Depo-Medrol 80 mg IM with steroid talk. He  will use up his Asmanex supply 1 puff b.i.d. We are scheduling a  pulmonary function test so that we will have some kind of a baseline  here. Schedule return in 1 year, but earlier p.r.n. failure to clear.     Clinton D. Maple Hudson, MD, Tonny Bollman, FACP  Electronically Signed    CDY/MedQ  DD: 02/21/2007  DT: 02/22/2007  Job #: [id number]

## 2010-12-04 NOTE — Assessment & Plan Note (Signed)
[facility]                             PULMONARY OFFICE NOTE   NAME:[name], [name] MONACO                    MRN:          [id number]  DATE:02/19/2007                            DOB:          [birth year]    PROBLEM:  1. Allergic rhinitis.  2. Mild intermittent asthma.  3. Allergic conjunctivitis.   HISTORY:  He developed a bronchitis and sinusitis syndrome about a month  ago, and was treated at [facility] with Z-Pak, prednisone, and cough  syrup.  He was still coughing up some yellow sputum, then took another  round of a different antibiotic.  He tried Asmanex for a month with no  effect.  While on prednisone, his sense of smell and taste improved, and  he realizes it helped nasal congestion.  He still feels somewhat  congested in his head.   MEDICATIONS:  Flonase, Singulair, albuterol HFA, Afrin.   NO MEDICATION ALLERGY.   We discussed appropriate use of Afirin.   OBJECTIVE:  Weight 254 pounds. BP 128/82.  Pulse 90.  Room air  saturation 97%.  No cough.  Chest is clear.  HEART:  Sounds normal.  There may be very mild turbinate edema.  No visible drainage.  Nasal  airway and ears were otherwise unremarkable.  No adenopathy.   IMPRESSION:  Apparently, rather sustained rhinitis and bronchitis  syndrome.   PLAN:  We refilled his fluticasone nasal spray and albuterol inhaler.  He was given Depo-Medrol 80 mg IM with steroid talk.  He will use up his  remaining Asmanex at 1 puff b.i.d., and then give up on that if it has  not helped.  We are scheduling pulmonary function test.  If residual  symptoms clear, he will come back in a year for followup, otherwise we  will see him sooner.     Clinton D. Maple Hudson, MD, Tonny Bollman, FACP  Electronically Signed    CDY/MedQ  DD: 02/28/2007  DT: 03/01/2007  Job #: [id number]   cc:   Eulas Post [facility]

## 2010-12-07 NOTE — Op Note (Signed)
   TNAMEESVIN, HNAT                      ACCOUNT NO.:  [id number]   MEDICAL RECORD NO.:  [id number]                   PATIENT TYPE:  AMB   LOCATION:  [facility]                                 FACILITY:  [facility]   PHYSICIAN:  Verl Dicker, M.D.          DATE OF BIRTH:  05-Aug-1965   DATE OF PROCEDURE:  03/10/2002  DATE OF DISCHARGE:                                 OPERATIVE REPORT   PREOPERATIVE DIAGNOSIS:  A 5 x 5 mm stone, proximal left ureter with  intractable left costovertebral tenderness.   POSTOPERATIVE DIAGNOSIS:  A 5 x 5 mm stone, proximal left ureter with  intractable left costovertebral tenderness.   PROCEDURE:  1. Cystoscopy.  2. Retrograde left double-J stent.   ANESTHESIA:  General.   DRAINS:  6 French 26 cm double-J stent.   DESCRIPTION OF PROCEDURE:  The patient was prepped and draped in the dorsal  lithotomy position after institution of an adequate level of general  anesthesia.  Well-lubricated 21 French panendoscope was gently inserted at  the urethral meatus.  Normal urethra and sphincter.  Nonobstructive  prostate.  Right retrograde showed normal course and caliber of the ureter,  pelvis, and calices with prompt drainage at 3-5 minutes.  Left retrograde  showed 5 mm filling defect just below the UPJ.  With gentle injection of  contrast, stone was displaced into the calices, was felt to be beyond the  grasp of the short 6 French ureteroscope.  Guidewire was then advanced into  the upper pole calices.  A 6 French 26 cm double-J stent was passed over the  guidewire with excellent pigtail formation on guidewire removal.  Bladder  was drained, and cystoscope was removed.  The patient was returned to  recovery in satisfactory condition.                                               Verl Dicker, M.D.    [facility]/MEDQ  D:  03/10/2002  T:  03/10/2002  Job:  [id number]   cc:   Fredirick Maudlin, M.D.

## 2010-12-14 ENCOUNTER — Telehealth: Payer: Self-pay | Admitting: Internal Medicine

## 2010-12-14 NOTE — Telephone Encounter (Signed)
OK- noted. CDY

## 2010-12-14 NOTE — Telephone Encounter (Signed)
Spoke w/ pt wife and she states they have read the side effects of xolair and is nervous about starting these injections. Pt wife states they have an apt w/ Dr. Maple Hudson on Tuesday and will discuss this further with him at that time. Will forward to Dr. Maple Hudson so he is aware.   Carver Fila, CMA

## 2010-12-18 ENCOUNTER — Ambulatory Visit (INDEPENDENT_AMBULATORY_CARE_PROVIDER_SITE_OTHER): Payer: 59 | Admitting: Internal Medicine

## 2010-12-18 ENCOUNTER — Encounter: Payer: Self-pay | Admitting: Internal Medicine

## 2010-12-18 VITALS — BP 130/88 | HR 103 | Ht 70.0 in | Wt 264.0 lb

## 2010-12-18 DIAGNOSIS — J309 Allergic rhinitis, unspecified: Secondary | ICD-10-CM

## 2010-12-18 DIAGNOSIS — J45909 Unspecified asthma, uncomplicated: Secondary | ICD-10-CM

## 2010-12-18 MED ORDER — PREDNISONE 5 MG PO TABS: ORAL_TABLET | ORAL | Status: DC

## 2010-12-18 MED ORDER — MONTELUKAST SODIUM 10 MG PO TABS: 10.0000 mg | ORAL_TABLET | Freq: Every day | ORAL | Status: DC

## 2010-12-18 MED ORDER — ALBUTEROL SULFATE HFA 108 (90 BASE) MCG/ACT IN AERS: 2.0000 | INHALATION_SPRAY | Freq: Four times a day (QID) | RESPIRATORY_TRACT | Status: DC | PRN

## 2010-12-18 MED ORDER — ROFLUMILAST 500 MCG PO TABS: 500.0000 ug | ORAL_TABLET | Freq: Every day | ORAL | Status: DC

## 2010-12-18 NOTE — Patient Instructions (Addendum)
Add Daliresp 500 mg, sample and script, 1 daily after food  Refill script for prednisone 5mg , to use 0-5 daily as needed- try to minimize.   Refill other meds- call as needed   Peak flow meter with instructrion

## 2010-12-18 NOTE — Progress Notes (Signed)
Subjective:    Patient ID: [name] [name], male    DOB: Nov 30, 1965, 45 y.o.   MRN: [id number]  HPI    Review of Systems     Objective:   Physical Exam        Assessment & Plan:   Subjective:    Patient ID: [name] [name], male    DOB: [birth year], 45 y.o.   MRN: [id number]  HPI 45 yoM followed for hx of asthma, rhinitis and chronic sinusitis. Last here December 26, 2009. Since then he had extensive sinus surgery and polypectomy by Dr Lazarus Salines 09/24/10, which did help nasal congestion. They noted the pollen issues this Spring. Slow prednisone taper ended 2 weeks ago. Off that he again got congested in chest, wheezing, short of breath interfering with sleep. We called short prednisone taper which has helped, but not enough. He continues Symbicort, Flonase, Singulair. Dr Lazarus Salines treated for thrush. Denies diabetes. He notes weight gain on prednisone, Skin test positive and on allergy vaccine in the past. 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  12/18/10- Asthma, rhinitis, chronic sinusitis s/p sinus surgery/ polypectomy.  We restarted prednisone 5 mg and he is using 1-3 daily. Feels "no better no worse". Duleara 200  is the same as Symbicort 160. Nasonex works better than Fluticasone. He and his wife discussed Xolair and decided not to risk it for fear of side effects. He continues to work in [employer], but says lung disease is not thought to be associated with lung disease. Nasal airway now seems great, 3 months after surgery. Still wakes once/ night to cough up white mucus. Can't breathe w/o prednisone. Feels better outdoors.    Review of Systems See HPI Constitutional:    NO- night sweats ,fevers, chills, fatigue, lassitude. HEENT:   No headaches,  Difficulty swallowing,  Tooth/dental problems,  Sore throat,                No sneezing, itching, ear ache,  CV:  No chest pain,  Orthopnea, PND, swelling in  lower extremities, anasarca, dizziness, palpitations  GI  No heartburn, indigestion, abdominal pain, nausea, vomiting, diarrhea, change in bowel habits, loss of appetite  Resp:.  No excess mucus,   No coughing up of blood.  No change in color of mucus.   Skin: no rash or lesions.  GU: no dysuria, change in color of urine, no urgency or frequency.  No flank pain.  MS:  No joint pain or swelling.  No decreased range of motion.  No back pain.  Psych:   change in mood or affect.  depression and  anxiety.  No memory loss.       Objective:   Physical Exam General- Alert, Oriented, Affect-appropriate, Distress- none acute               obese  Skin- rash-none, lesions- none, excoriation- none  Lymphadenopathy- none  Head- atraumatic  Eyes- Gross vision intact, PERRLA, conjunctivae clear secretions  Ears- Hearing, canals, Tm- normal  Nose- Clear, no-Septal dev, polyps, erosion, perforation.   White mucus bridging  Throat- Mallampati IV , mucosa clear , drainage- none visible , tonsils- atrophic    Hoarse voice. Not obvious thrush.   Neck- flexible , trachea midline, no stridor , thyroid nl, carotid no bruit  Chest - symmetrical excursion , unlabored     Heart/CV- RRR , no murmur , no gallop  , no rub, nl  s1 s2                     - JVD- none , edema- none, stasis changes- none, varices- none     Lung- , wheeze- none , cough- none , dullness-none, rub- none     Chest wall-  Abd- tender-no, distended-no, bowel sounds-present, HSM- no  Br/ Gen/ Rectal- Not done, not indicated  Extrem- cyanosis- none, clubbing, none, atrophy- none, strength- nl  Neuro- grossly intact to observation         Assessment & Plan:

## 2010-12-18 NOTE — Assessment & Plan Note (Signed)
Improved rhinitis/ sinusitis since surgery

## 2010-12-18 NOTE — Assessment & Plan Note (Addendum)
We will try Daliresp as discussed. We discussed why he might feel better outdoors. He had HVAC tested last year  We will give peak flow meter

## 2010-12-19 ENCOUNTER — Telehealth: Payer: Self-pay | Admitting: Internal Medicine

## 2010-12-19 MED ORDER — MOMETASONE FURO-FORMOTEROL FUM 200-5 MCG/ACT IN AERO: 2.0000 | INHALATION_SPRAY | Freq: Two times a day (BID) | RESPIRATORY_TRACT | Status: DC

## 2010-12-19 NOTE — Telephone Encounter (Signed)
lmom for pt's wife that this med has been sent to the pharmacy

## 2010-12-19 NOTE — Telephone Encounter (Signed)
Pt aware rx sent to the pharmacy. 

## 2010-12-22 ENCOUNTER — Encounter: Payer: Self-pay | Admitting: Internal Medicine

## 2011-01-02 ENCOUNTER — Telehealth: Payer: Self-pay | Admitting: Internal Medicine

## 2011-01-02 NOTE — Telephone Encounter (Signed)
PA initiated for Daliresp through [employer] at [phone]. Awaiting form. Case ID # is [id number].

## 2011-01-02 NOTE — Telephone Encounter (Signed)
Form received and placed on Dr. Maple Hudson cart.

## 2011-01-03 NOTE — Telephone Encounter (Signed)
Given to Katie.

## 2011-01-04 ENCOUNTER — Encounter: Payer: Self-pay | Admitting: Internal Medicine

## 2011-01-04 NOTE — Telephone Encounter (Signed)
I faxed the information to the insurance company and placed in triage waiting for approval/denial.

## 2011-01-07 NOTE — Telephone Encounter (Signed)
PA has been denied. Pt didn't meet guidelines- please advise.

## 2011-01-09 NOTE — Telephone Encounter (Signed)
Just have to tell him insurance won't pay for it. I doubt he would want to self pay.

## 2011-01-10 NOTE — Telephone Encounter (Signed)
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

## 2011-01-29 ENCOUNTER — Ambulatory Visit (INDEPENDENT_AMBULATORY_CARE_PROVIDER_SITE_OTHER): Payer: 59 | Admitting: Internal Medicine

## 2011-01-29 ENCOUNTER — Telehealth: Payer: Self-pay | Admitting: Internal Medicine

## 2011-01-29 DIAGNOSIS — J45909 Unspecified asthma, uncomplicated: Secondary | ICD-10-CM

## 2011-01-29 LAB — PULMONARY FUNCTION TEST

## 2011-01-29 MED ORDER — BUDESONIDE-FORMOTEROL FUMARATE 160-4.5 MCG/ACT IN AERO: 2.0000 | INHALATION_SPRAY | Freq: Two times a day (BID) | RESPIRATORY_TRACT | Status: DC

## 2011-01-29 MED ORDER — PREDNISONE 5 MG PO TABS: ORAL_TABLET | ORAL | Status: DC

## 2011-01-29 NOTE — Telephone Encounter (Signed)
Called, spoke with pt.  States while on dulera he was having back pain and it did not seem as effective as the symbicort.  Pt states he stopped the dulera and went back on the symbicort 160 2 puffs bid.  Since then, back pain has resolved.  He would like rx for symbicort and prednisone 5mg  2-3 daily as needed.  [facility] [address]  Dr. Maple Hudson, pls advise if ok to send in symbicort rx.  Thanks!    FYI:  Pt states he is not taking Daliresp bc his insurance will not cover it.

## 2011-01-29 NOTE — Progress Notes (Signed)
PFT done today. 

## 2011-01-29 NOTE — Telephone Encounter (Signed)
Per CY-okay to give refill on Prednisone as last time and okay for patient to have refills for Symbicort.

## 2011-01-29 NOTE — Telephone Encounter (Signed)
Called spoke with patient, advised CDY okay'd for refills on both the symbicort and prednisone.  Last pred rx was for #100 with 3 refills.  Pt verbalized his understanding.  rx's sent to verified pharmacy.

## 2011-02-01 ENCOUNTER — Encounter: Payer: Self-pay | Admitting: Internal Medicine

## 2011-03-18 ENCOUNTER — Ambulatory Visit (INDEPENDENT_AMBULATORY_CARE_PROVIDER_SITE_OTHER): Payer: 59 | Admitting: Internal Medicine

## 2011-03-18 ENCOUNTER — Encounter: Payer: Self-pay | Admitting: Internal Medicine

## 2011-03-18 VITALS — BP 150/70 | HR 89 | Ht 70.0 in | Wt 268.0 lb

## 2011-03-18 DIAGNOSIS — J45909 Unspecified asthma, uncomplicated: Secondary | ICD-10-CM

## 2011-03-18 MED ORDER — CLORAZEPATE DIPOTASSIUM 7.5 MG PO TABS: 7.5000 mg | ORAL_TABLET | Freq: Two times a day (BID) | ORAL | Status: DC | PRN

## 2011-03-18 NOTE — Assessment & Plan Note (Addendum)
We talked again about available tools.  We will try sample Spiriva Prednisone is making him hard to put up with. He asks about something to help with this. He asks about trying chlorazepate 7.5.

## 2011-03-18 NOTE — Patient Instructions (Signed)
Script for tranxene  Printed  Sample Spiriva- one daily

## 2011-03-18 NOTE — Progress Notes (Signed)
Subjective:    Patient ID: [name] [name], male    DOB: 1965/07/27, 45 y.o.   MRN: [id number]  HPI  Subjective:    Patient ID: [name] [name], male    DOB: [birth year], 45 y.o.   MRN: [id number]  HPI    Review of Systems     Objective:   Physical Exam        Assessment & Plan:   Subjective:    Patient ID: [name] [name], male    DOB: 1966-07-02, 45 y.o.   MRN: [id number]  HPI 20 yoM followed for hx of asthma, rhinitis and chronic sinusitis. Last here December 26, 2009. Since then he had extensive sinus surgery and polypectomy by Dr Lazarus Salines 09/24/10, which did help nasal congestion. They noted the pollen issues this Spring. Slow prednisone taper ended 2 weeks ago. Off that he again got congested in chest, wheezing, short of breath interfering with sleep. We called short prednisone taper which has helped, but not enough. He continues Symbicort, Flonase, Singulair. Dr Lazarus Salines treated for thrush. Denies diabetes. He notes weight gain on prednisone, Skin test positive and on allergy vaccine in the past. 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  12/18/10- Asthma, rhinitis, chronic sinusitis s/p sinus surgery/ polypectomy.  We restarted prednisone 5 mg and he is using 1-3 daily. Feels "no better no worse". Duleara 200  is the same as Symbicort 160. Nasonex works better than Fluticasone. He and his wife discussed Xolair and decided not to risk it for fear of side effects. He continues to work in [employer], but says lung disease is not thought to be associated with lung disease. Nasal airway now seems great, 3 months after surgery. Still wakes once/ night to cough up white mucus. Can't breathe w/o prednisone. Feels better outdoors.   03/18/11-  Asthma, rhinitis, chronic sinusitis s/p sinus surgery/ polypectomy.  Cough remains productive, with much postnasal drip. Continue neti pot. Denies aspiration/ choke while  eating. Some off and on wheeze.  Insurance wouldn't pay for [employer]. He associated back pain with Dulera, relieved by returning to Symbicort Uses nebulizer 3 x most days. Denies glaucoma or prostatism> Discussed Spiriva as a drying option to try. Since nasal surgery by Dr Lazarus Salines he drains more. Nasonex better than fluticasone. Not using an antihistamine.  Prednisone 2-3 x 5 mg daily; tries to skip it but never can. We talked again about trying Xolair- he says his wife was frightened by reported side effects.   Review of Systems See HPI Constitutional:    NO- night sweats ,fevers, chills, fatigue, lassitude. HEENT:   No headaches,  Difficulty swallowing,  Tooth/dental problems,  Sore throat,                No sneezing, itching, ear ache, CV:  No chest pain,  Orthopnea, PND, swelling in lower extremities, anasarca, dizziness, palpitations GI  No heartburn, indigestion, abdominal pain, nausea, vomiting, diarrhea, change in bowel habits, loss of appetite Resp:.  No excess mucus,   No coughing up of blood.  No change in color of mucus.  Skin: no rash or lesions. GU: no dysuria, change in color of urine, no urgency or frequency.  No flank pain. MS:  No joint pain or swelling.  No decreased range of motion.  No back pain. Psych:   change in mood or affect.  depression and  anxiety.  No memory loss.  Today Objective:  Physical Exam General- Alert, Oriented, Affect-appropriate, Distress- none acute Skin- rash-none, lesions- none, excoriation- none Lymphadenopathy- none Head- atraumatic            Eyes- Gross vision intact, PERRLA, conjunctivae clear secretions            Ears- Hearing, canals normal            Nose- Clear, no-Septal dev, mucus, polyps, erosion, perforation             Throat- Mallampati IV , mucosa clear , drainage- none, tonsils- atrophic Neck- flexible , trachea midline, no stridor , thyroid nl, carotid no bruit Chest - symmetrical excursion , unlabored            Heart/CV- RRR , no murmur , no gallop  , no rub, nl s1 s2                           - JVD- none , edema- none, stasis changes- none, varices- none           Lung- +trace wheeze,     cough- none , dullness-none, rub- none           Chest wall-  Abd- tender-no, distended-no, bowel sounds-present, HSM- no Br/ Gen/ Rectal- Not done, not indicated Extrem- cyanosis- none, clubbing, none, atrophy- none, strength- nl Neuro- grossly intact to observation           Assessment & Plan:     Review of Systems     Objective:   Physical Exam        Assessment & Plan:

## 2011-03-26 ENCOUNTER — Telehealth: Payer: Self-pay | Admitting: Internal Medicine

## 2011-03-26 NOTE — Telephone Encounter (Signed)
Spoke with pt. He states that CDY gave him 1 sample of spiriva at last ov, feels like the medicine is helping some, wants to try more samples before we prescribe rx. I will leave him 1 more sample up front and he will call back before runs out to let us know if it is a definite help.

## 2011-04-02 ENCOUNTER — Telehealth: Payer: Self-pay | Admitting: Internal Medicine

## 2011-04-02 MED ORDER — TIOTROPIUM BROMIDE MONOHYDRATE 18 MCG IN CAPS: 18.0000 ug | ORAL_CAPSULE | Freq: Every day | RESPIRATORY_TRACT | Status: DC

## 2011-04-02 NOTE — Telephone Encounter (Signed)
lmomtcb  

## 2011-04-02 NOTE — Telephone Encounter (Signed)
I spoke with pt and he states he feels like the spiriva does help his breathing and would like to continue on this medication. Pt would like a 90 day supply sent to [facility]. Please advise Dr. Maple Hudson of okay to send rx. Thanks  Carver Fila, CMA

## 2011-04-02 NOTE — Telephone Encounter (Signed)
Per CY-okay Spiriva #90 1 daily with 3 refills.

## 2011-04-02 NOTE — Telephone Encounter (Signed)
PT AWARE MED SENT TO [facility] AND SAMPLES OUT FRONT TO LAST UNTIL MAIL ORDER ARRIVES.

## 2011-04-02 NOTE — Telephone Encounter (Signed)
Returning call.

## 2011-04-12 ENCOUNTER — Telehealth: Payer: Self-pay | Admitting: Internal Medicine

## 2011-04-12 MED ORDER — BUDESONIDE-FORMOTEROL FUMARATE 160-4.5 MCG/ACT IN AERO: 2.0000 | INHALATION_SPRAY | Freq: Two times a day (BID) | RESPIRATORY_TRACT | Status: DC

## 2011-04-12 MED ORDER — MONTELUKAST SODIUM 10 MG PO TABS: 10.0000 mg | ORAL_TABLET | Freq: Every day | ORAL | Status: DC

## 2011-04-12 MED ORDER — ALBUTEROL SULFATE HFA 108 (90 BASE) MCG/ACT IN AERS: 2.0000 | INHALATION_SPRAY | Freq: Four times a day (QID) | RESPIRATORY_TRACT | Status: DC | PRN

## 2011-04-12 MED ORDER — MOMETASONE FUROATE 50 MCG/ACT NA SUSP: 2.0000 | Freq: Every day | NASAL | Status: DC

## 2011-04-12 MED ORDER — PREDNISONE 5 MG PO TABS: ORAL_TABLET | ORAL | Status: DC

## 2011-04-12 MED ORDER — CLORAZEPATE DIPOTASSIUM 7.5 MG PO TABS: 7.5000 mg | ORAL_TABLET | Freq: Two times a day (BID) | ORAL | Status: DC | PRN

## 2011-04-12 NOTE — Telephone Encounter (Signed)
Pt's spouse aware all prescriptions have been sent to [facility] for 90 day supply.

## 2011-04-12 NOTE — Telephone Encounter (Signed)
Spoke with pt's spouse. She states he is switching to [facility] and needs 90 days supply rxs sent for prednisone, tranxene, singulair, proair, nasonex, and symbicort. He just had tranxene filled at local pharm on 04/02/11. Is it okay to send in rxs now? Please advise, thanks!

## 2011-04-12 NOTE — Telephone Encounter (Signed)
Ok to change scripts to 3 month for mail order. -Thanks

## 2011-06-19 ENCOUNTER — Encounter: Payer: Self-pay | Admitting: Internal Medicine

## 2011-06-19 ENCOUNTER — Ambulatory Visit (INDEPENDENT_AMBULATORY_CARE_PROVIDER_SITE_OTHER): Payer: 59 | Admitting: Internal Medicine

## 2011-06-19 VITALS — BP 146/92 | HR 100 | Ht 70.0 in | Wt 259.6 lb

## 2011-06-19 DIAGNOSIS — B370 Candidal stomatitis: Secondary | ICD-10-CM

## 2011-06-19 DIAGNOSIS — B3781 Candidal esophagitis: Secondary | ICD-10-CM

## 2011-06-19 DIAGNOSIS — J45909 Unspecified asthma, uncomplicated: Secondary | ICD-10-CM

## 2011-06-19 MED ORDER — CLORAZEPATE DIPOTASSIUM 7.5 MG PO TABS: 7.5000 mg | ORAL_TABLET | Freq: Two times a day (BID) | ORAL | Status: AC | PRN

## 2011-06-19 MED ORDER — FLUCONAZOLE 150 MG PO TABS: 150.0000 mg | ORAL_TABLET | Freq: Every day | ORAL | Status: AC

## 2011-06-19 NOTE — Progress Notes (Signed)
Patient ID: [name] [name], male    DOB: Dec 25, 1965, 45 y.o.   MRN: [id number]  HPI 3 yoM followed for hx of asthma, rhinitis and chronic sinusitis. Last here December 26, 2009. Since then he had extensive sinus surgery and polypectomy by Dr Lazarus Salines 09/24/10, which did help nasal congestion. They noted the pollen issues this Spring. Slow prednisone taper ended 2 weeks ago. Off that he again got congested in chest, wheezing, short of breath interfering with sleep. We called short prednisone taper which has helped, but not enough. He continues Symbicort, Flonase, Singulair. Dr Lazarus Salines treated for thrush. Denies diabetes. He notes weight gain on prednisone, Skin test positive and on allergy vaccine in the past. 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  12/18/10- Asthma, rhinitis, chronic sinusitis s/p sinus surgery/ polypectomy.  We restarted prednisone 5 mg and he is using 1-3 daily. Feels "no better no worse". Duleara 200  is the same as Symbicort 160. Nasonex works better than Fluticasone. He and his wife discussed Xolair and decided not to risk it for fear of side effects. He continues to work in [employer], but says lung disease is not thought to be associated with lung disease. Nasal airway now seems great, 3 months after surgery. Still wakes once/ night to cough up white mucus. Can't breathe w/o prednisone. Feels better outdoors.   03/18/11-  Asthma, rhinitis, chronic sinusitis s/p sinus surgery/ polypectomy.  Cough remains productive, with much postnasal drip. Continue neti pot. Denies aspiration/ choke while eating. Some off and on wheeze.  Insurance wouldn't pay for [employer]. He associated back pain with Dulera, relieved by returning to Symbicort Uses nebulizer 3 x most days. Denies glaucoma or prostatism> Discussed Spiriva as a drying option to try. Since nasal surgery by Dr Lazarus Salines he drains more. Nasonex better than  fluticasone. Not using an antihistamine.  Prednisone 2-3 x 5 mg daily; tries to skip it but never can. We talked again about trying Xolair- he says his wife was frightened by reported side effects. [name] followed for hx of asthma, rhinitis and chronic sinusitis.  06/19/11- 45 yoM never smoker followed for hx of asthma, rhinitis and chronic sinusitis. After a very difficult half year, he says he finally feels that he is at least more stable. There is extra stress from dealing with his sick and elderly father. Tranxene has been a big help with the stress. Since he is doing less "stress eating" he has lost 10 pounds. He likes Spiriva and uses his nebulizer twice a day. We have continued him taking daily maintenance prednisone between 10 and 15 mg daily. This has caused thrush. Since the sinus surgery, he still has no sinus infection but continues using his sinus rinse. Cough continues to be productive with scant yellow. Often he can't get it to come out and will wheeze. He denies fever, sweat, sore throat, swollen glands, blood.   Review of Systems See HPI Constitutional:    NO- night sweats ,fevers, chills, fatigue, lassitude. HEENT:   No headaches,  Difficulty swallowing,  Tooth/dental problems,  Sore throat,                No sneezing, itching, ear ache, CV:  No chest pain,  Orthopnea, PND, swelling in lower extremities, anasarca, dizziness, palpitations GI  No heartburn, indigestion, abdominal pain, nausea, vomiting, diarrhea, change in bowel habits, loss of appetite Resp:.See HPI  No excess mucus,  No coughing up of blood.  No change in color of mucus.  Skin: no rash or lesions. GU: no dysuria, change in color of urine, no urgency or frequency.  No flank pain. MS:  No joint pain or swelling.  No decreased range of motion.  No back pain. Psych:   change in mood or affect.  depression and  anxiety.  No memory loss.  Today Objective:   Physical Exam General- Alert, Oriented,  Affect-appropriate, Distress- none acute, overweight Skin- rash-none, lesions- none, excoriation- none Lymphadenopathy- none Head- atraumatic            Eyes- Gross vision intact, PERRLA, conjunctivae clear secretions            Ears- Hearing, canals normal            Nose- Clear, no-Septal dev, mucus, polyps, erosion, perforation             Throat- Mallampati  III- IV , mucosa clear , drainage- none, tonsils- atrophic. +thrush, hoarse Neck- flexible , trachea midline, no stridor , thyroid nl, carotid no bruit Chest - symmetrical excursion , unlabored           Heart/CV- RRR , no murmur , no gallop  , no rub, nl s1 s2                           - JVD- none , edema- none, stasis changes- none, varices- none           Lung- sounds are coarse without wheeze today,     cough- none , dullness-none, rub- none           Chest wall-  Abd- tender-no, distended-no, bowel sounds-present, HSM- no Br/ Gen/ Rectal- Not done, not indicated Extrem- cyanosis- none, clubbing, none, atrophy- none, strength- nl Neuro- grossly intact to observation

## 2011-06-19 NOTE — Patient Instructions (Addendum)
Script sent for fluconazole for thrush  Script to refill tranxene  Flu vax

## 2011-06-20 DIAGNOSIS — B370 Candidal stomatitis: Secondary | ICD-10-CM | POA: Insufficient documentation

## 2011-06-20 NOTE — Assessment & Plan Note (Signed)
We have discussed the etiology of thrush and the interaction between steroids and antibiotics. We can clear him for now with Diflucan but I have emphasized mouth cleaning. We will try to taper the steroid.

## 2011-06-20 NOTE — Assessment & Plan Note (Addendum)
Better control with less reactive airway and less bronchitis pattern. Regular use of his nebulizer machine, maintenance prednisone and the Spiriva have helped. We discussed how to slowly taper down prednisone. Insurance won't cover [employer].

## 2011-08-07 ENCOUNTER — Telehealth: Payer: Self-pay | Admitting: Internal Medicine

## 2011-08-07 MED ORDER — DOXYCYCLINE HYCLATE 100 MG PO TABS: ORAL_TABLET | ORAL | Status: AC

## 2011-08-07 MED ORDER — HYDROCODONE-HOMATROPINE 5-1.5 MG/5ML PO SYRP: 5.0000 mL | ORAL_SOLUTION | Freq: Four times a day (QID) | ORAL | Status: AC | PRN

## 2011-08-07 NOTE — Telephone Encounter (Signed)
Spoke with pt. He states has had prod cough with large amounts of yellow to green sputum- onset was 4 days ago, and he also has been wheezing a lot at night and having increased need for neb txs. No fever. He is requesting that CDY prescribe abx and possibly cough syrup that contains expectorant. Please advise, thanks! Allergies  Allergen Reactions  . Advair Hfa     REACTION: jaw joint pain

## 2011-08-07 NOTE — Telephone Encounter (Signed)
Per Cy-okay to give Doxycycline 100 mg #8 take 2 today then 1 daily until gone no refills; Hydromet cough syrup # 200 ml 1 tsp every 6 hours prn no refills, and use OTC Mucinex [phone] 1 tablet twice daily.

## 2011-08-07 NOTE — Telephone Encounter (Signed)
Called spoke with patient, advised of CDY's recs.  Pt verbalized his understanding.  rx's telephoned into verified pharmacy.  Pt to call back if symptoms do not improve or worsen.

## 2011-08-26 ENCOUNTER — Telehealth: Payer: Self-pay | Admitting: Internal Medicine

## 2011-08-26 MED ORDER — HYDROCODONE-HOMATROPINE 5-1.5 MG/5ML PO SYRP
5.0000 mL | ORAL_SOLUTION | Freq: Four times a day (QID) | ORAL | Status: AC | PRN
Start: 2011-08-26 — End: 2011-09-05

## 2011-08-26 MED ORDER — AMOXICILLIN 500 MG PO TABS: 500.0000 mg | ORAL_TABLET | Freq: Three times a day (TID) | ORAL | Status: AC

## 2011-08-26 NOTE — Telephone Encounter (Signed)
I spoke with pt and he c/o cough w/ yellow phlem, hoarseness, very little nasal congestion, chest congestion, some wheezing worse x 4 days. Denies any fever, chills, sweats, vomiting. Pt had a little hydromet cough syrup left and is almost finished. Pt had taken doxycyline on 08/07/11 and stated it only helped a little. Pt is wanting to have something called in. Please advise Dr. Maple Hudson, thanks  Allergies  Allergen Reactions  . Advair Hfa     REACTION: jaw joint pain      [facility] [address]

## 2011-08-26 NOTE — Telephone Encounter (Signed)
Patient is aware of prescriptions and will call if his symptoms do not improve or seek emergency help if symptoms get worse.

## 2011-08-26 NOTE — Telephone Encounter (Signed)
Per CY-okay to give Amoxicillin 500 mg # 21 take 1 po tid with 1 refill and ok to refill hydromet cough syrup this time only.

## 2011-09-17 ENCOUNTER — Telehealth: Payer: Self-pay | Admitting: Internal Medicine

## 2011-09-17 MED ORDER — IPRATROPIUM-ALBUTEROL 0.5-2.5 (3) MG/3ML IN SOLN: 3.0000 mL | RESPIRATORY_TRACT | Status: DC | PRN

## 2011-09-17 MED ORDER — ALBUTEROL SULFATE HFA 108 (90 BASE) MCG/ACT IN AERS: 2.0000 | INHALATION_SPRAY | Freq: Four times a day (QID) | RESPIRATORY_TRACT | Status: DC | PRN

## 2011-09-17 MED ORDER — FLUTICASONE PROPIONATE 50 MCG/ACT NA SUSP: 2.0000 | Freq: Every day | NASAL | Status: DC

## 2011-09-17 NOTE — Telephone Encounter (Signed)
I spoke with pt and stated he needed his duoneb and albuterol inhaler sent to [facility]. I advised pt will send rx. Also pt is requesting to have his nasonex changed to flonase to see how he does on this due to cost wise. Please advise Dr. Maple Hudson, thanks

## 2011-09-17 NOTE — Telephone Encounter (Signed)
Per CY-okay to change Nasonex to Flonase #3 2 puffs each nostril daily with 3 refills-this has been sent to [facility] and patient aware.

## 2011-09-18 ENCOUNTER — Telehealth: Payer: Self-pay | Admitting: Internal Medicine

## 2011-09-18 MED ORDER — ALBUTEROL SULFATE HFA 108 (90 BASE) MCG/ACT IN AERS: 2.0000 | INHALATION_SPRAY | Freq: Four times a day (QID) | RESPIRATORY_TRACT | Status: DC | PRN

## 2011-09-18 NOTE — Telephone Encounter (Signed)
Pt's spouse states the Rennis Golden is not covered by the pt's insurance and she is requesting a new RX be sent for Ventolin HFA instead. Okay to send new RX? Pls advise.

## 2011-09-18 NOTE — Telephone Encounter (Signed)
Spouse aware new Rx has been sent to [facility] for Ventolin.

## 2011-09-19 ENCOUNTER — Telehealth: Payer: Self-pay | Admitting: Internal Medicine

## 2011-09-19 NOTE — Telephone Encounter (Signed)
Spoke with [facility]-would like to know if CY is okay with patient being on Duoneb neb tx and Spiriva or if he would like have patient use albuterol neb only. Thanks.

## 2011-09-19 NOTE — Telephone Encounter (Signed)
Per CY-he is aware patient is on both and okay to give patient. [facility] is aware and will send RX to patient.

## 2011-10-03 ENCOUNTER — Telehealth: Payer: Self-pay | Admitting: Internal Medicine

## 2011-10-03 MED ORDER — FLUCONAZOLE 150 MG PO TABS: 150.0000 mg | ORAL_TABLET | Freq: Once | ORAL | Status: DC

## 2011-10-03 NOTE — Telephone Encounter (Signed)
Per CY-okay to give Diflucan 150 mg # 3 take 1 po qd with 5 refills. Pt's wife(Kelly) is aware that Rx has been sent to [facility] [address]   I accidentally sent Rx to [facility] but I resent to local pharmacy and called to cancel the Rx at [facility].

## 2011-10-03 NOTE — Telephone Encounter (Signed)
I spoke with spouse and she states pt has some thrush in his mouth due to his spiriva and would like generic diflucan called in for [facility] [address]. Please advise Dr. Maple Hudson, thanks

## 2011-10-17 ENCOUNTER — Ambulatory Visit (INDEPENDENT_AMBULATORY_CARE_PROVIDER_SITE_OTHER): Payer: 59 | Admitting: Internal Medicine

## 2011-10-17 ENCOUNTER — Encounter: Payer: Self-pay | Admitting: Internal Medicine

## 2011-10-17 VITALS — BP 124/88 | HR 94 | Ht 70.0 in | Wt 265.2 lb

## 2011-10-17 DIAGNOSIS — J329 Chronic sinusitis, unspecified: Secondary | ICD-10-CM

## 2011-10-17 DIAGNOSIS — J45909 Unspecified asthma, uncomplicated: Secondary | ICD-10-CM

## 2011-10-17 MED ORDER — AZITHROMYCIN 250 MG PO TABS: ORAL_TABLET | ORAL | Status: DC

## 2011-10-17 NOTE — Progress Notes (Signed)
Patient ID: [name] [name], male    DOB: 29-Aug-1965, 46 y.o.   MRN: [id number]  HPI 69 yoM followed for hx of asthma, rhinitis and chronic sinusitis. Last here December 26, 2009. Since then he had extensive sinus surgery and polypectomy by Dr Lazarus Salines 09/24/10, which did help nasal congestion. They noted the pollen issues this Spring. Slow prednisone taper ended 2 weeks ago. Off that he again got congested in chest, wheezing, short of breath interfering with sleep. We called short prednisone taper which has helped, but not enough. He continues Symbicort, Flonase, Singulair. Dr Lazarus Salines treated for thrush. Denies diabetes. He notes weight gain on prednisone, Skin test positive and on allergy vaccine in the past. 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  12/18/10- Asthma, rhinitis, chronic sinusitis s/p sinus surgery/ polypectomy.  We restarted prednisone 5 mg and he is using 1-3 daily. Feels "no better no worse". Duleara 200  is the same as Symbicort 160. Nasonex works better than Fluticasone. He and his wife discussed Xolair and decided not to risk it for fear of side effects. He continues to work in [employer], but says lung disease is not thought to be associated with lung disease. Nasal airway now seems great, 3 months after surgery. Still wakes once/ night to cough up white mucus. Can't breathe w/o prednisone. Feels better outdoors.   03/18/11-  Asthma, rhinitis, chronic sinusitis s/p sinus surgery/ polypectomy.  Cough remains productive, with much postnasal drip. Continue neti pot. Denies aspiration/ choke while eating. Some off and on wheeze.  Insurance wouldn't pay for [employer]. He associated back pain with Dulera, relieved by returning to Symbicort Uses nebulizer 3 x most days. Denies glaucoma or prostatism> Discussed Spiriva as a drying option to try. Since nasal surgery by Dr Lazarus Salines he drains more. Nasonex better than  fluticasone. Not using an antihistamine.  Prednisone 2-3 x 5 mg daily; tries to skip it but never can. We talked again about trying Xolair- he says his wife was frightened by reported side effects. [name] followed for hx of asthma, rhinitis and chronic sinusitis.  06/19/11- 45 yoM never smoker followed for hx of asthma, rhinitis and chronic sinusitis. After a very difficult half year, he says he finally feels that he is at least more stable. There is extra stress from dealing with his sick and elderly father. Tranxene has been a big help with the stress. Since he is doing less "stress eating" he has lost 10 pounds. He likes Spiriva and uses his nebulizer twice a day. We have continued him taking daily maintenance prednisone between 10 and 15 mg daily. This has caused thrush. Since the sinus surgery, he still has no sinus infection but continues using his sinus rinse. Cough continues to be productive with scant yellow. Often he can't get it to come out and will wheeze. He denies fever, sweat, sore throat, swollen glands, blood.  10/17/11- 45 yoM never smoker followed for hx of asthma, rhinitis and chronic sinusitis. Acute bronchitis exacerbation slowly resolving over 11 weeks. Hoarseness is slowly getting better. Still doing saline nasal rinse every day. Cough is always productive of mucus which is light yellow to green. Now off of antibiotics. Still taking prednisone 15 mg daily-can't get lower. Continues Symbicort.  Review of Systems-See HPI Constitutional:    No- night sweats ,fevers, chills, fatigue, lassitude. HEENT:   No headaches,  Difficulty swallowing,  Tooth/dental problems,  Sore throat,  No sneezing, itching, ear ache, CV:  No chest pain,  Orthopnea, PND, swelling in lower extremities, anasarca, dizziness, palpitations GI  No heartburn, indigestion, abdominal pain, nausea, vomiting,  Resp:.See HPI  Productive cough   No coughing up of blood.  + change in color of mucus.    Skin: no rash or lesions. GU: . MS:  No joint pain or swelling.  [name] Psych:   change in mood or affect.  depression and  anxiety.  No memory loss.  Today Objective:   Physical Exam General- Alert, Oriented, Affect-appropriate, Distress- none acute, overweight Skin- rash-none, lesions- none, excoriation- none Lymphadenopathy- none Head- atraumatic            Eyes- Gross vision intact, PERRLA, conjunctivae clear secretions            Ears- Hearing, canals normal            Nose- Clear, no-Septal dev, mucus, polyps, erosion, perforation             Throat- Mallampati  III- IV , mucosa clear , drainage- none, tonsils- atrophic. , hoarse Neck- flexible , trachea midline, no stridor , thyroid nl, carotid no bruit Chest - symmetrical excursion , unlabored           Heart/CV- RRR , no murmur , no gallop  , no rub, nl s1 s2                           - JVD- none , edema- none, stasis changes- none, varices- none           Lung-  coarse  wheeze today,     cough- none , dullness-none, rub- none           Chest wall-  Abd-  Br/ Gen/ Rectal- Not done, not indicated Extrem- cyanosis- none, clubbing, none, atrophy- none, strength- nl Neuro- grossly intact to observation

## 2011-10-17 NOTE — Patient Instructions (Signed)
Script sent to try daily long term maintenance suppression of bronchitis with zithromax.

## 2011-10-23 NOTE — Assessment & Plan Note (Signed)
Plan-try maintenance antibiotic suppression with Zithromax. We discussed pro and cons and realistic expectation.

## 2011-10-23 NOTE — Assessment & Plan Note (Signed)
Slow resolution of latest rhinosinusitis/upper respiratory infection associated with bronchitis, responsive to Levaquin and prednisone.

## 2011-10-28 ENCOUNTER — Telehealth: Payer: Self-pay | Admitting: Internal Medicine

## 2011-10-28 MED ORDER — AMOXICILLIN 500 MG PO CAPS: 500.0000 mg | ORAL_CAPSULE | Freq: Three times a day (TID) | ORAL | Status: AC

## 2011-10-28 MED ORDER — HYDROCODONE-HOMATROPINE 5-1.5 MG/5ML PO SYRP: 5.0000 mL | ORAL_SOLUTION | Freq: Four times a day (QID) | ORAL | Status: AC | PRN

## 2011-10-28 NOTE — Telephone Encounter (Signed)
rx sent. Pt is aware. Jeter Tomey, CMA  

## 2011-10-28 NOTE — Telephone Encounter (Signed)
Spoke with patient-aware of recs but would like to have CY advise and would like to have other abx that he was given last time this happened. Pt is willing to wait until CY is back in the office.

## 2011-10-28 NOTE — Telephone Encounter (Signed)
Nothing of offer over the phone, needs ov

## 2011-10-28 NOTE — Telephone Encounter (Signed)
Ok to refill amoxacillin 500 mg, # 30,   1 three times daily,   No ref            Rx hydromet , # 200 ml,  5 ml every 6 hours as needed for cough

## 2011-10-28 NOTE — Telephone Encounter (Signed)
Pt seen CY on 10-17-11 and was put on maintenance dose of Zithromax 250 mg daily.  5 days ago pt started with prod cough (yellow-green), diff sleeping due to cough and congestion, wheezing.  PT increased Zithromax to three times a day.  PT not sure if he needs a different abx and would also like refill on Hydromet cough syrup.  Please advise. Will forward to Doc of day since CY is out of office.

## 2011-12-26 ENCOUNTER — Telehealth: Payer: Self-pay | Admitting: Internal Medicine

## 2011-12-26 MED ORDER — IPRATROPIUM-ALBUTEROL 0.5-2.5 (3) MG/3ML IN SOLN: 3.0000 mL | RESPIRATORY_TRACT | Status: DC | PRN

## 2011-12-26 MED ORDER — PREDNISONE 5 MG PO TABS: ORAL_TABLET | ORAL | Status: DC

## 2011-12-26 MED ORDER — CLORAZEPATE DIPOTASSIUM 7.5 MG PO TABS: 7.5000 mg | ORAL_TABLET | Freq: Every day | ORAL | Status: DC | PRN

## 2011-12-26 MED ORDER — AZITHROMYCIN 250 MG PO TABS: ORAL_TABLET | ORAL | Status: DC

## 2011-12-26 NOTE — Telephone Encounter (Signed)
Will refill all meds, but want to check with CDY first on the clorazepate- Please advise if okay to call in 90 day supply for this med, thanks!

## 2011-12-26 NOTE — Telephone Encounter (Signed)
RX's have been called in and pt aware. Nothing further was needed

## 2011-12-26 NOTE — Telephone Encounter (Signed)
Per CY-okay to refill azithromycin and 90 day supply of clorazepate.

## 2011-12-26 NOTE — Telephone Encounter (Signed)
Pt states he forgot to add azithromycin to his refill list.Juanita Jinny Sanders

## 2012-01-01 ENCOUNTER — Other Ambulatory Visit: Payer: Self-pay | Admitting: Internal Medicine

## 2012-01-02 NOTE — Telephone Encounter (Signed)
Please advise if okay to refill. Thanks.  

## 2012-01-03 ENCOUNTER — Telehealth: Payer: Self-pay | Admitting: Internal Medicine

## 2012-01-03 NOTE — Telephone Encounter (Signed)
Refill request has already been sent to [facility] as a refill encounter. Will sign of this message

## 2012-01-03 NOTE — Telephone Encounter (Signed)
Ok to refill 

## 2012-01-09 NOTE — Telephone Encounter (Signed)
What is this one asking for? 

## 2012-01-21 ENCOUNTER — Ambulatory Visit (INDEPENDENT_AMBULATORY_CARE_PROVIDER_SITE_OTHER): Payer: BC Managed Care – PPO | Admitting: Internal Medicine

## 2012-01-21 ENCOUNTER — Encounter: Payer: Self-pay | Admitting: Internal Medicine

## 2012-01-21 VITALS — BP 120/84 | HR 95 | Ht 70.0 in | Wt 264.8 lb

## 2012-01-21 DIAGNOSIS — B3781 Candidal esophagitis: Secondary | ICD-10-CM

## 2012-01-21 DIAGNOSIS — J45909 Unspecified asthma, uncomplicated: Secondary | ICD-10-CM

## 2012-01-21 DIAGNOSIS — B370 Candidal stomatitis: Secondary | ICD-10-CM

## 2012-01-21 MED ORDER — FLUTICASONE PROPIONATE 50 MCG/ACT NA SUSP: 2.0000 | Freq: Every day | NASAL | Status: DC

## 2012-01-21 MED ORDER — MONTELUKAST SODIUM 10 MG PO TABS: 10.0000 mg | ORAL_TABLET | Freq: Every day | ORAL | Status: DC

## 2012-01-21 MED ORDER — BUDESONIDE-FORMOTEROL FUMARATE 160-4.5 MCG/ACT IN AERO: 2.0000 | INHALATION_SPRAY | Freq: Two times a day (BID) | RESPIRATORY_TRACT | Status: DC

## 2012-01-21 MED ORDER — TIOTROPIUM BROMIDE MONOHYDRATE 18 MCG IN CAPS: 18.0000 ug | ORAL_CAPSULE | Freq: Every day | RESPIRATORY_TRACT | Status: DC

## 2012-01-21 NOTE — Patient Instructions (Addendum)
Scripts refilled  Continue azithromycin maintenance  We want you always on the lowest systemic prednisone dose that works for you. You may find at times that you can get by with alternating 3 tabs with 2 tabs every other day.   Please call as needed

## 2012-01-21 NOTE — Progress Notes (Signed)
Patient ID: [name] [name], male    DOB: [birth year], 46 y.o.   MRN: [id number]  HPI 46 yoM followed for hx of asthma, rhinitis and chronic sinusitis. Last here December 26, 2009. Since then he had extensive sinus surgery and polypectomy by Dr Erik Obey 03/24/78, which did help nasal congestion. They noted the pollen issues this Spring. Slow prednisone taper ended 2 weeks ago. Off that he again got congested in chest, wheezing, short of breath interfering with sleep. We called short prednisone taper which has helped, but not enough. He continues Symbicort, Flonase, Singulair. Dr Erik Obey treated for thrush. Denies diabetes. He notes weight gain on prednisone, Skin test positive and on allergy vaccine in the past. 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  12/18/10- Asthma, rhinitis, chronic sinusitis s/p sinus surgery/ polypectomy.  We restarted prednisone 5 mg and he is using 1-3 daily. Feels "no better no worse". Duleara 200  is the same as Symbicort 160. Nasonex works better than Fluticasone. He and his wife discussed Xolair and decided not to risk it for fear of side effects. He continues to work in [employer], but says lung disease is not thought to be associated with lung disease. Nasal airway now seems great, 3 months after surgery. Still wakes once/ night to cough up white mucus. Can't breathe w/o prednisone. Feels better outdoors.   03/18/11-  Asthma, rhinitis, chronic sinusitis s/p sinus surgery/ polypectomy.  Cough remains productive, with much postnasal drip. Continue neti pot. Denies aspiration/ choke while eating. Some off and on wheeze.  Insurance wouldn't pay for [employer]. He associated back pain with Dulera, relieved by returning to Symbicort Uses nebulizer 3 x most days. Denies glaucoma or prostatism> Discussed Spiriva as a drying option to try. Since nasal surgery by Dr Erik Obey he drains more. Nasonex better than  fluticasone. Not using an antihistamine.  Prednisone 2-3 x 5 mg daily; tries to skip it but never can. We talked again about trying Xolair- he says his wife was frightened by reported side effects. 46 yoM followed for hx of asthma, rhinitis and chronic sinusitis.  06/19/11- 46 yoM never smoker followed for hx of asthma, rhinitis and chronic sinusitis. After a very difficult half year, he says he finally feels that he is at least more stable. There is extra stress from dealing with his sick and elderly father. Tranxene has been a big help with the stress. Since he is doing less "stress eating" he has lost 10 pounds. He likes Spiriva and uses his nebulizer twice a day. We have continued him taking daily maintenance prednisone between 10 and 15 mg daily. This has caused thrush. Since the sinus surgery, he still has no sinus infection but continues using his sinus rinse. Cough continues to be productive with scant yellow. Often he can't get it to come out and will wheeze. He denies fever, sweat, sore throat, swollen glands, blood.  10/17/11- 46 yoM never smoker followed for hx of asthma, rhinitis and chronic sinusitis. Acute bronchitis exacerbation slowly resolving over 11 weeks. Hoarseness is slowly getting better. Still doing saline nasal rinse every day. Cough is always productive of mucus which is light yellow to green. Now off of antibiotics. Still taking prednisone 15 mg daily-can't get lower. Continues Symbicort.  01/21/12- 46 yoM never smoker followed for hx of asthma, rhinitis and chronic sinusitis. Can tell a big difference with taking Azithromycin once daily; not using rescue inhaler as  often now. Son has graduated from high school, removing a significant source of stress in the family. Hoarseness comes and goes, because of which he can't sing. We discussed thrush, reflux and postnasal drainage as possible contributors. He cannot get lower than 15 mg prednisone daily. Much less use of rescue  inhaler as noted. He decided side effect risk was too great to try Xolair shots. He does use AeroChamber with Symbicort. Has cut down some on Spiriva but uses nebulizer twice a day.  Review of Systems-See HPI Constitutional:    No- night sweats ,fevers, chills, fatigue, lassitude. HEENT:   No headaches,  Difficulty swallowing,  Tooth/dental problems,  Sore throat,                No sneezing, itching, ear ache, CV:  No chest pain,  Orthopnea, PND, swelling in lower extremities, anasarca, dizziness, palpitations GI  No heartburn, indigestion, abdominal pain, nausea, vomiting,  Resp:.See HPI   Some productive cough   No coughing up of blood.  No- change in color of mucus.  Skin: no rash or lesions. GU: . MS:  No joint pain or swelling.  [name] Psych:   change in mood or affect.  depression and  anxiety.  No memory loss.  Today Objective:   Physical Exam General- Alert, Oriented, Affect-appropriate, Distress- none acute, overweight Skin- rash-none, lesions- none, excoriation- none Lymphadenopathy- none Head- atraumatic            Eyes- Gross vision intact, PERRLA, conjunctivae clear secretions            Ears- Hearing, canals normal            Nose- Clear, no-Septal dev, mucus, polyps, erosion, perforation             Throat- Mallampati  III- IV , mucosa clear , drainage- none, tonsils- atrophic. , +hoarse,+ mild thrush Neck- flexible , trachea midline, no stridor , thyroid nl, carotid no bruit Chest - symmetrical excursion , unlabored           Heart/CV- RRR , no murmur , no gallop  , no rub, nl s1 s2                           - JVD- none , edema- none, stasis changes- none, varices- none           Lung- only light rattle today,    cough- none , dullness-none, rub- none           Chest wall-  Abd-  Br/ Gen/ Rectal- Not done, not indicated Extrem- cyanosis- none, clubbing, none, atrophy- none, strength- nl Neuro- grossly intact to observation

## 2012-01-26 NOTE — Assessment & Plan Note (Signed)
Emphasis on mouth care.

## 2012-01-26 NOTE — Assessment & Plan Note (Addendum)
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 We reviewed his medication use and his options today.

## 2012-03-30 ENCOUNTER — Telehealth: Payer: Self-pay | Admitting: Internal Medicine

## 2012-03-30 MED ORDER — CLORAZEPATE DIPOTASSIUM 7.5 MG PO TABS: 7.5000 mg | ORAL_TABLET | Freq: Every day | ORAL | Status: DC | PRN

## 2012-03-30 MED ORDER — PREDNISONE 5 MG PO TABS: ORAL_TABLET | ORAL | Status: DC

## 2012-03-30 NOTE — Telephone Encounter (Signed)
Pt is requesting a refill on clorazepate 7.5 mg 1po at bedtime PRN and prednisone 5 mg 2-3 daily prn. Both of these were last refilled 12/26/11-clorazepate #90 x 0 refills and the prednisone 270 x 0 refills. Last OV 01/21/12 pending OC 07/28/12. Please advise if okay to refill thanks

## 2012-03-30 NOTE — Telephone Encounter (Signed)
Rx refills were sent to pharm Pt aware 

## 2012-03-30 NOTE — Telephone Encounter (Signed)
Per CY-okay to refill now then x 3.

## 2012-03-31 ENCOUNTER — Telehealth: Payer: Self-pay | Admitting: Internal Medicine

## 2012-03-31 MED ORDER — CLORAZEPATE DIPOTASSIUM 7.5 MG PO TABS: ORAL_TABLET | ORAL | Status: DC

## 2012-03-31 NOTE — Telephone Encounter (Signed)
I spoke with pt and he stated yesterday when he called for his clorazepate 7.5 mg he forgot to mention if this could be increased to be up to twice a day. He has not picked rx up yet. Please advise Dr. Maple Hudson thanks

## 2012-03-31 NOTE — Telephone Encounter (Signed)
Tranxene rx change called into Melanie with [facility] who verbalized understanding.  I only have 1 additional refill as rx would expire with 3 refills.    lmomtcb on both numbers provided above to inform pt.

## 2012-03-31 NOTE — Telephone Encounter (Signed)
Ok to change tranxene script to # 180, 1 twice daily as needed, Ref x 3

## 2012-04-01 NOTE — Telephone Encounter (Signed)
Spoke with pt's spouse and notified that the rx was sent. She will inform the pt. Nothing further needed.

## 2012-04-09 ENCOUNTER — Other Ambulatory Visit: Payer: Self-pay | Admitting: Internal Medicine

## 2012-04-10 NOTE — Telephone Encounter (Signed)
Ok refill both? 

## 2012-04-10 NOTE — Telephone Encounter (Signed)
Please advise if okay to refill Zpak. Thanks.

## 2012-04-16 ENCOUNTER — Other Ambulatory Visit: Payer: Self-pay | Admitting: Internal Medicine

## 2012-04-17 ENCOUNTER — Telehealth: Payer: Self-pay | Admitting: Internal Medicine

## 2012-04-17 MED ORDER — AZITHROMYCIN 250 MG PO TABS: ORAL_TABLET | ORAL | Status: DC

## 2012-04-17 NOTE — Telephone Encounter (Signed)
[facility] RECEIVED RX FOR AZITHROMYCIN #6 qd X 2 REFILLS. They are needing new rx to state #30 x 2 refills so pt doesn't get charged extra. i have fixed rx. Nothing further was needed

## 2012-05-27 ENCOUNTER — Telehealth: Payer: Self-pay | Admitting: Internal Medicine

## 2012-05-27 MED ORDER — PROMETHAZINE-CODEINE 6.25-10 MG/5ML PO SYRP: 5.0000 mL | ORAL_SOLUTION | Freq: Four times a day (QID) | ORAL | Status: DC | PRN

## 2012-05-27 MED ORDER — AMOXICILLIN-POT CLAVULANATE 875-125 MG PO TABS: 1.0000 | ORAL_TABLET | Freq: Two times a day (BID) | ORAL | Status: AC

## 2012-05-27 NOTE — Telephone Encounter (Signed)
Per CY-give Augmentin 875 mg #14 1 po bid and Promethazine with codeine cough syrup #251ml 1 tsp every 6 hours prn cough no refills; pt aware of rx's called to pharmacy.

## 2012-05-27 NOTE — Telephone Encounter (Signed)
Pt complains of sinus and chest congestion, cough with yellowish/green sputum. Has run a low grade fever. He has been taking Zithromax 250mg  once a day as usual but nothing is helping. He would like an antibiotic and a cough med if possible. Please advise CY. Thanks!

## 2012-05-27 NOTE — Telephone Encounter (Signed)
Pt called back to check on the status of this message.  Would like to have something called in if at all possible today.  Thanks.  [name] [name]

## 2012-07-21 ENCOUNTER — Other Ambulatory Visit: Payer: Self-pay | Admitting: *Deleted

## 2012-07-21 MED ORDER — AZITHROMYCIN 250 MG PO TABS: ORAL_TABLET | ORAL | Status: DC

## 2012-07-24 ENCOUNTER — Other Ambulatory Visit: Payer: Self-pay | Admitting: *Deleted

## 2012-07-24 MED ORDER — AZITHROMYCIN 250 MG PO TABS: ORAL_TABLET | ORAL | Status: DC

## 2012-07-24 NOTE — Progress Notes (Signed)
Patient takes this medication on a daily basis for maintenance therapy according to the last OV note from 01/21/12. Pt scheduled for follow-up OV on 07/28/12.

## 2012-07-28 ENCOUNTER — Ambulatory Visit (INDEPENDENT_AMBULATORY_CARE_PROVIDER_SITE_OTHER): Payer: BC Managed Care – PPO | Admitting: Internal Medicine

## 2012-07-28 ENCOUNTER — Encounter: Payer: Self-pay | Admitting: Internal Medicine

## 2012-07-28 ENCOUNTER — Ambulatory Visit (INDEPENDENT_AMBULATORY_CARE_PROVIDER_SITE_OTHER)
Admission: RE | Admit: 2012-07-28 | Discharge: 2012-07-28 | Disposition: A | Discharge status: Home / Self Care | Payer: BC Managed Care – PPO | Source: Ambulatory Visit | Attending: Internal Medicine | Admitting: Internal Medicine

## 2012-07-28 VITALS — BP 140/84 | HR 97 | Ht 70.0 in | Wt 275.6 lb

## 2012-07-28 DIAGNOSIS — J45909 Unspecified asthma, uncomplicated: Secondary | ICD-10-CM

## 2012-07-28 MED ORDER — IPRATROPIUM-ALBUTEROL 0.5-2.5 (3) MG/3ML IN SOLN: RESPIRATORY_TRACT | Status: DC

## 2012-07-28 MED ORDER — ALBUTEROL SULFATE HFA 108 (90 BASE) MCG/ACT IN AERS: 2.0000 | INHALATION_SPRAY | Freq: Four times a day (QID) | RESPIRATORY_TRACT | Status: DC | PRN

## 2012-07-28 MED ORDER — CEFDINIR 300 MG PO CAPS: 300.0000 mg | ORAL_CAPSULE | Freq: Two times a day (BID) | ORAL | Status: DC

## 2012-07-28 MED ORDER — CLORAZEPATE DIPOTASSIUM 7.5 MG PO TABS: ORAL_TABLET | ORAL | Status: DC

## 2012-07-28 NOTE — Progress Notes (Signed)
Patient ID: [name] [name], male    DOB: [birth year], 47 y.o.   MRN: [id number]  HPI 80 yoM followed for hx of asthma, rhinitis and chronic sinusitis. Last here December 26, 2009. Since then he had extensive sinus surgery and polypectomy by Dr Erik Obey 03/24/78, which did help nasal congestion. They noted the pollen issues this Spring. Slow prednisone taper ended 2 weeks ago. Off that he again got congested in chest, wheezing, short of breath interfering with sleep. We called short prednisone taper which has helped, but not enough. He continues Symbicort, Flonase, Singulair. Dr Erik Obey treated for thrush. Denies diabetes. He notes weight gain on prednisone, Skin test positive and on allergy vaccine in the past. 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  12/18/10- Asthma, rhinitis, chronic sinusitis s/p sinus surgery/ polypectomy.  We restarted prednisone 5 mg and he is using 1-3 daily. Feels "no better no worse". Duleara 200  is the same as Symbicort 160. Nasonex works better than Fluticasone. He and his wife discussed Xolair and decided not to risk it for fear of side effects. He continues to work in [employer], but says lung disease is not thought to be associated with lung disease. Nasal airway now seems great, 3 months after surgery. Still wakes once/ night to cough up white mucus. Can't breathe w/o prednisone. Feels better outdoors.   03/18/11-  Asthma, rhinitis, chronic sinusitis s/p sinus surgery/ polypectomy.  Cough remains productive, with much postnasal drip. Continue neti pot. Denies aspiration/ choke while eating. Some off and on wheeze.  Insurance wouldn't pay for [employer]. He associated back pain with Dulera, relieved by returning to Symbicort Uses nebulizer 3 x most days. Denies glaucoma or prostatism> Discussed Spiriva as a drying option to try. Since nasal surgery by Dr Erik Obey he drains more. Nasonex better than  fluticasone. Not using an antihistamine.  Prednisone 2-3 x 5 mg daily; tries to skip it but never can. We talked again about trying Xolair- he says his wife was frightened by reported side effects. 57 yoM followed for hx of asthma, rhinitis and chronic sinusitis.  06/19/11- 67 yoM never smoker followed for hx of asthma, rhinitis and chronic sinusitis. After a very difficult half year, he says he finally feels that he is at least more stable. There is extra stress from dealing with his sick and elderly father. Tranxene has been a big help with the stress. Since he is doing less "stress eating" he has lost 10 pounds. He likes Spiriva and uses his nebulizer twice a day. We have continued him taking daily maintenance prednisone between 10 and 15 mg daily. This has caused thrush. Since the sinus surgery, he still has no sinus infection but continues using his sinus rinse. Cough continues to be productive with scant yellow. Often he can't get it to come out and will wheeze. He denies fever, sweat, sore throat, swollen glands, blood.  10/17/11- 57 yoM never smoker followed for hx of asthma, rhinitis and chronic sinusitis. Acute bronchitis exacerbation slowly resolving over 11 weeks. Hoarseness is slowly getting better. Still doing saline nasal rinse every day. Cough is always productive of mucus which is light yellow to green. Now off of antibiotics. Still taking prednisone 15 mg daily-can't get lower. Continues Symbicort.  01/21/12- 18 yoM never smoker followed for hx of asthma, rhinitis and chronic sinusitis. Can tell a big difference with taking Azithromycin once daily; not using rescue inhaler as  often now. Son has graduated from high school, removing a significant source of stress in the family. Hoarseness comes and goes, because of which he can't sing. We discussed thrush, reflux and postnasal drainage as possible contributors. He cannot get lower than 15 mg prednisone daily. Much less use of rescue  inhaler as noted. He decided side effect risk was too great to try Xolair shots. He does use AeroChamber with Symbicort. Has cut down some on Spiriva but uses nebulizer twice a day.  07/28/12- 45 yoM never smoker followed for hx of asthma, rhinitis and chronic sinusitis. FOLLOWS FOR: sick in November and had to get RX; sickness keeps going around in family; slight wheezing and SOB since. A viral syndrome respiratory infection was passing around in his family in November. We had called in Augmentin which helped. He was well until Christmas when he had another flare. Persistent cough with scant yellow sputum, no fever or blood. We had tried leaving him on maintenance Zithromax. Using 1 puff Symbicort one time daily. Continues tranxene. No longer having sinus infections since the sinus surgery but continues Flonase and uses a mask for yard work. Continues prednisone 15 mg daily; 10 mg was not enough to keep him stable.  Review of Systems-See HPI Constitutional:    No- night sweats ,fevers, chills, fatigue, lassitude. HEENT:   No headaches,  Difficulty swallowing,  Tooth/dental problems,  Sore throat,                No sneezing, itching, ear ache, CV:  No chest pain,  Orthopnea, PND, swelling in lower extremities, anasarca, dizziness, palpitations GI  No heartburn, indigestion, abdominal pain, nausea, vomiting,  Resp:.See HPI   +Some productive cough   No coughing up of blood.  + change in color of mucus.  Skin: no rash or lesions. GU: . MS:  No joint pain or swelling.  [name] Psych:   change in mood or affect.  depression and  anxiety.  No memory loss.  Today Objective:   Physical Exam General- Alert, Oriented, Affect-appropriate, Distress- none acute, overweight Skin- rash-none, lesions- none, excoriation- none Lymphadenopathy- none Head- atraumatic            Eyes- Gross vision intact, PERRLA, conjunctivae clear secretions            Ears- Hearing, canals normal            Nose- Clear, no-Septal  dev, mucus, polyps, erosion, perforation             Throat- Mallampati  III- IV , mucosa clear , drainage- none, tonsils- atrophic. , +hoarse Neck- flexible , trachea midline, no stridor , thyroid nl, carotid no bruit Chest - symmetrical excursion , unlabored           Heart/CV- RRR , no murmur , no gallop  , no rub, nl s1 s2                           - JVD- none , edema- none, stasis changes- none, varices- none           Lung- almost clear,    cough- none , dullness-none, rub- none           Chest wall-  Abd-  Br/ Gen/ Rectal- Not done, not indicated Extrem- cyanosis- none, clubbing, none, atrophy- none, strength- nl Neuro- grossly intact to observation

## 2012-07-28 NOTE — Patient Instructions (Addendum)
Order- CXR  Dx asthma with bronchitis  Letter- [name] [name] is under our care for chronic asthma with bronchitis. I recommend that he avoid working outdoors for more than an hour at a time during extremely hot or cold weather.  Script for cefdinir antibiotic  Ok to stop daily maintenance zithromax now.

## 2012-07-30 ENCOUNTER — Telehealth: Payer: Self-pay | Admitting: Internal Medicine

## 2012-07-30 NOTE — Progress Notes (Signed)
Quick Note:  Spoke with pt. He is aware cxr normal per Dr. Maple Hudson. He verbalized understanding of results. ______

## 2012-07-30 NOTE — Telephone Encounter (Signed)
Notes Recorded by Waymon Budge, MD on 07/28/2012 at 5:15 PM CXR- normal chest xray  ----  Called, spoke with pt.  Informed him of cxr results per Dr. Maple Hudson.  He verbalized understanding.  Pt states he started cefdinir and is now coughing up dark mucus.  He would like this sent to Dr. Maple Hudson as Lorain Childes.  Will sign off and route to [facility] as FYI.

## 2012-08-08 NOTE — Assessment & Plan Note (Signed)
He requests a letter recommending he limit outdoor exposure at work especially in extremes of weather. Plan-stop Zithromax now after one year of daily maintenance. Chest x-ray, Cefdinir x 10 days.

## 2012-09-07 ENCOUNTER — Other Ambulatory Visit: Payer: Self-pay | Admitting: Internal Medicine

## 2012-12-03 ENCOUNTER — Other Ambulatory Visit: Payer: Self-pay | Admitting: Internal Medicine

## 2012-12-04 NOTE — Telephone Encounter (Signed)
Ok to refill 

## 2012-12-04 NOTE — Telephone Encounter (Signed)
Please advise if okay to give refill. Thanks.

## 2012-12-07 ENCOUNTER — Telehealth: Payer: Self-pay | Admitting: Internal Medicine

## 2012-12-07 MED ORDER — FLUTICASONE PROPIONATE 50 MCG/ACT NA SUSP: 2.0000 | Freq: Every day | NASAL | Status: DC

## 2012-12-07 NOTE — Telephone Encounter (Signed)
Rx has been sent in. 

## 2013-01-16 ENCOUNTER — Other Ambulatory Visit: Payer: Self-pay | Admitting: Internal Medicine

## 2013-01-27 ENCOUNTER — Encounter: Payer: Self-pay | Admitting: Internal Medicine

## 2013-01-27 ENCOUNTER — Ambulatory Visit (INDEPENDENT_AMBULATORY_CARE_PROVIDER_SITE_OTHER): Payer: BC Managed Care – PPO | Admitting: Internal Medicine

## 2013-01-27 VITALS — BP 140/84 | HR 84 | Ht 70.0 in | Wt 274.6 lb

## 2013-01-27 DIAGNOSIS — J45909 Unspecified asthma, uncomplicated: Secondary | ICD-10-CM

## 2013-01-27 DIAGNOSIS — B3781 Candidal esophagitis: Secondary | ICD-10-CM

## 2013-01-27 DIAGNOSIS — J4540 Moderate persistent asthma, uncomplicated: Secondary | ICD-10-CM

## 2013-01-27 DIAGNOSIS — B370 Candidal stomatitis: Secondary | ICD-10-CM

## 2013-01-27 MED ORDER — AZITHROMYCIN 250 MG PO TABS: ORAL_TABLET | ORAL | Status: DC

## 2013-01-27 MED ORDER — BUDESONIDE-FORMOTEROL FUMARATE 160-4.5 MCG/ACT IN AERO: 2.0000 | INHALATION_SPRAY | Freq: Two times a day (BID) | RESPIRATORY_TRACT | Status: DC

## 2013-01-27 MED ORDER — FLUTICASONE PROPIONATE 50 MCG/ACT NA SUSP: 2.0000 | Freq: Every day | NASAL | Status: DC

## 2013-01-27 MED ORDER — PREDNISONE 5 MG PO TABS: ORAL_TABLET | ORAL | Status: DC

## 2013-01-27 MED ORDER — IPRATROPIUM-ALBUTEROL 0.5-2.5 (3) MG/3ML IN SOLN: RESPIRATORY_TRACT | Status: DC

## 2013-01-27 MED ORDER — TIOTROPIUM BROMIDE MONOHYDRATE 18 MCG IN CAPS: 18.0000 ug | ORAL_CAPSULE | Freq: Every day | RESPIRATORY_TRACT | Status: DC

## 2013-01-27 MED ORDER — MONTELUKAST SODIUM 10 MG PO TABS: ORAL_TABLET | ORAL | Status: DC

## 2013-01-27 MED ORDER — CLORAZEPATE DIPOTASSIUM 7.5 MG PO TABS: ORAL_TABLET | ORAL | Status: DC

## 2013-01-27 MED ORDER — ALBUTEROL SULFATE HFA 108 (90 BASE) MCG/ACT IN AERS: 2.0000 | INHALATION_SPRAY | Freq: Four times a day (QID) | RESPIRATORY_TRACT | Status: DC | PRN

## 2013-01-27 NOTE — Progress Notes (Signed)
Patient ID: STACIE KNUTZEN, male    DOB: [birth year], 47 y.o.   MRN: [id number]  HPI 80 yoM followed for hx of asthma, rhinitis and chronic sinusitis. Last here December 26, 2009. Since then he had extensive sinus surgery and polypectomy by Dr Erik Obey 03/24/78, which did help nasal congestion. They noted the pollen issues this Spring. Slow prednisone taper ended 2 weeks ago. Off that he again got congested in chest, wheezing, short of breath interfering with sleep. We called short prednisone taper which has helped, but not enough. He continues Symbicort, Flonase, Singulair. Dr Erik Obey treated for thrush. Denies diabetes. He notes weight gain on prednisone, Skin test positive and on allergy vaccine in the past. 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  12/18/10- Asthma, rhinitis, chronic sinusitis s/p sinus surgery/ polypectomy.  We restarted prednisone 5 mg and he is using 1-3 daily. Feels "no better no worse". Duleara 200  is the same as Symbicort 160. Nasonex works better than Fluticasone. He and his wife discussed Xolair and decided not to risk it for fear of side effects. He continues to work in [employer], but says lung disease is not thought to be associated with lung disease. Nasal airway now seems great, 3 months after surgery. Still wakes once/ night to cough up white mucus. Can't breathe w/o prednisone. Feels better outdoors.   03/18/11-  Asthma, rhinitis, chronic sinusitis s/p sinus surgery/ polypectomy.  Cough remains productive, with much postnasal drip. Continue neti pot. Denies aspiration/ choke while eating. Some off and on wheeze.  Insurance wouldn't pay for [employer]. He associated back pain with Dulera, relieved by returning to Symbicort Uses nebulizer 3 x most days. Denies glaucoma or prostatism> Discussed Spiriva as a drying option to try. Since nasal surgery by Dr Erik Obey he drains more. Nasonex better than  fluticasone. Not using an antihistamine.  Prednisone 2-3 x 5 mg daily; tries to skip it but never can. We talked again about trying Xolair- he says his wife was frightened by reported side effects. 57 yoM followed for hx of asthma, rhinitis and chronic sinusitis.  06/19/11- 67 yoM never smoker followed for hx of asthma, rhinitis and chronic sinusitis. After a very difficult half year, he says he finally feels that he is at least more stable. There is extra stress from dealing with his sick and elderly father. Tranxene has been a big help with the stress. Since he is doing less "stress eating" he has lost 10 pounds. He likes Spiriva and uses his nebulizer twice a day. We have continued him taking daily maintenance prednisone between 10 and 15 mg daily. This has caused thrush. Since the sinus surgery, he still has no sinus infection but continues using his sinus rinse. Cough continues to be productive with scant yellow. Often he can't get it to come out and will wheeze. He denies fever, sweat, sore throat, swollen glands, blood.  10/17/11- 57 yoM never smoker followed for hx of asthma, rhinitis and chronic sinusitis. Acute bronchitis exacerbation slowly resolving over 11 weeks. Hoarseness is slowly getting better. Still doing saline nasal rinse every day. Cough is always productive of mucus which is light yellow to green. Now off of antibiotics. Still taking prednisone 15 mg daily-can't get lower. Continues Symbicort.  01/21/12- 18 yoM never smoker followed for hx of asthma, rhinitis and chronic sinusitis. Can tell a big difference with taking Azithromycin once daily; not using rescue inhaler as  often now. Son has graduated from high school, removing a significant source of stress in the family. Hoarseness comes and goes, because of which he can't sing. We discussed thrush, reflux and postnasal drainage as possible contributors. He cannot get lower than 15 mg prednisone daily. Much less use of rescue  inhaler as noted. He decided side effect risk was too great to try Xolair shots. He does use AeroChamber with Symbicort. Has cut down some on Spiriva but uses nebulizer twice a day.  07/28/12- 45 yoM never smoker followed for hx of asthma, rhinitis and chronic sinusitis. FOLLOWS FOR: sick in November and had to get RX; sickness keeps going around in family; slight wheezing and SOB since. A viral syndrome respiratory infection was passing around in his family in November. We had called in Augmentin which helped. He was well until Christmas when he had another flare. Persistent cough with scant yellow sputum, no fever or blood. We had tried leaving him on maintenance Zithromax. Using 1 puff Symbicort one time daily. Continues tranxene. No longer having sinus infections since the sinus surgery but continues Flonase and uses a mask for yard work. Continues prednisone 15 mg daily; 10 mg was not enough to keep him stable.  01/27/13- 45 yoM never smoker followed for hx of asthma, rhinitis and chronic sinusitis. FOLLOWS FOR: pt reports he has had some "rough days" throughout the winter, but since warmer weather breathing is doing better; feels like he is at a baseline Weather limiting wants to work outside especially in the winter. Wears a mask to mow. Tried Cefdinir- no help. Likes azithromycin used in intervals when needed. We discussed anti-inflammatory effect. Daily nebulizer 2 or 3 times/DuoNeb. Continues other meds as discussed. CXR 07/30/12 IMPRESSION:  No edema or consolidation.  Original Report Authenticated By: Bretta Bang, M.D.  Review of Systems-See HPI Constitutional:    No- night sweats ,fevers, chills, fatigue, lassitude. HEENT:   No headaches,  Difficulty swallowing,  Tooth/dental problems,  Sore throat,                No sneezing, itching, ear ache, CV:  No chest pain,  Orthopnea, PND, swelling in lower extremities, anasarca, dizziness, palpitations GI  No heartburn, indigestion,  abdominal pain, nausea, vomiting,  Resp:.See HPI   +Some productive cough   No coughing up of blood.  + change in color of mucus.  Skin: no rash or lesions. GU: . MS:  No joint pain or swelling.  [name] Psych:   change in mood or affect.  depression and  anxiety.  No memory loss.  Today Objective:   Physical Exam General- Alert, Oriented, Affect-appropriate, Distress- none acute, overweight Skin- rash-none, lesions- none, excoriation- none Lymphadenopathy- none Head- atraumatic            Eyes- Gross vision intact, PERRLA, conjunctivae clear secretions            Ears- Hearing, canals normal            Nose- Clear, no-Septal dev, mucus, polyps, erosion, perforation             Throat- Mallampati  III- IV , mucosa clear , drainage- none, tonsils- atrophic. , +hoarse Neck- flexible , trachea midline, no stridor , thyroid nl, carotid no bruit Chest - symmetrical excursion , unlabored           Heart/CV- RRR , no murmur , no gallop  , no rub, nl s1 s2                           -  JVD- none , edema- none, stasis changes- none, varices- none           Lung- + clear,    cough- none , dullness-none, rub- none           Chest wall-  Abd-  Br/ Gen/ Rectal- Not done, not indicated Extrem- cyanosis- none, clubbing, none, atrophy- none, strength- nl Neuro- grossly intact to observation

## 2013-01-27 NOTE — Patient Instructions (Addendum)
Medications refilled as discussed  Please call as needed

## 2013-02-10 NOTE — Assessment & Plan Note (Signed)
He sounds fairly clear now, the best of GERD and of while. This reflects the summer season and ongoing medications which we reviewed. We talked again about steroid side effects. Anti-inflammatory effect of azithromycin may be more important than the antibacterial effect. Plan-refill azithromycin

## 2013-02-10 NOTE — Assessment & Plan Note (Signed)
I don't see definite thrush now but will review symptoms in association with antibiotics and steroids

## 2013-03-11 ENCOUNTER — Telehealth: Payer: Self-pay | Admitting: Internal Medicine

## 2013-03-11 NOTE — Telephone Encounter (Signed)
I spoke with pt. He stated he is needing a letter from Dr. Maple Hudson. 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 Pt stated he had to fill out 7 page questionnaire about his health and personal information. On one part of the form it asked about any pulmonary issues/current symptoms. He only wears respirator occasional when working around certain chemicals/fumes. Pt is going to drop the paperwork off tomorrow also. He stated he needs the letter to state under Dr. Roxy Cedar opinion he does not see any problem for pt to respirator. Per pt since he has asthma this is why they require this letter. Pt aware [facility] is not back in until Monday. Will forward to him and his nurse.

## 2013-03-12 NOTE — Telephone Encounter (Signed)
Pt brought in the letter / OSHA requirement / and form to be filled out. He will pick this up when it is ready. I have given the packet / tan envelope to [place].  Hazel Sams

## 2013-03-16 ENCOUNTER — Encounter: Payer: Self-pay | Admitting: Internal Medicine

## 2013-03-17 NOTE — Telephone Encounter (Signed)
Did letter before finding list of specific details needed. Will re-do it if nec.

## 2013-03-17 NOTE — Telephone Encounter (Signed)
Pt aware that letter and packet of information is ready for pick up.

## 2013-08-04 ENCOUNTER — Ambulatory Visit (INDEPENDENT_AMBULATORY_CARE_PROVIDER_SITE_OTHER): Payer: BC Managed Care – PPO | Admitting: Internal Medicine

## 2013-08-04 ENCOUNTER — Encounter: Payer: Self-pay | Admitting: Internal Medicine

## 2013-08-04 VITALS — BP 122/76 | HR 88 | Ht 70.0 in | Wt 270.0 lb

## 2013-08-04 DIAGNOSIS — J45909 Unspecified asthma, uncomplicated: Secondary | ICD-10-CM

## 2013-08-04 MED ORDER — ARFORMOTEROL TARTRATE 15 MCG/2ML IN NEBU
15.0000 ug | INHALATION_SOLUTION | Freq: Once | RESPIRATORY_TRACT | Status: AC
  Administered 2013-08-04: 15 ug via RESPIRATORY_TRACT

## 2013-08-04 MED ORDER — FLUTICASONE FUROATE-VILANTEROL 100-25 MCG/INH IN AEPB: 1.0000 | INHALATION_SPRAY | Freq: Every day | RESPIRATORY_TRACT | Status: DC

## 2013-08-04 NOTE — Progress Notes (Signed)
Patient ID: [name] [name], male    DOB: [birth year], 48 y.o.   MRN: [id number]  HPI 80 yoM followed for hx of asthma, rhinitis and chronic sinusitis. Last here December 26, 2009. Since then he had extensive sinus surgery and polypectomy by Dr Erik Obey 03/24/78, which did help nasal congestion. They noted the pollen issues this Spring. Slow prednisone taper ended 2 weeks ago. Off that he again got congested in chest, wheezing, short of breath interfering with sleep. We called short prednisone taper which has helped, but not enough. He continues Symbicort, Flonase, Singulair. Dr Erik Obey treated for thrush. Denies diabetes. He notes weight gain on prednisone, Skin test positive and on allergy vaccine in the past. 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  12/18/10- Asthma, rhinitis, chronic sinusitis s/p sinus surgery/ polypectomy.  We restarted prednisone 5 mg and he is using 1-3 daily. Feels "no better no worse". Duleara 200  is the same as Symbicort 160. Nasonex works better than Fluticasone. He and his wife discussed Xolair and decided not to risk it for fear of side effects. He continues to work in [employer], but says lung disease is not thought to be associated with lung disease. Nasal airway now seems great, 3 months after surgery. Still wakes once/ night to cough up white mucus. Can't breathe w/o prednisone. Feels better outdoors.   03/18/11-  Asthma, rhinitis, chronic sinusitis s/p sinus surgery/ polypectomy.  Cough remains productive, with much postnasal drip. Continue neti pot. Denies aspiration/ choke while eating. Some off and on wheeze.  Insurance wouldn't pay for [employer]. He associated back pain with Dulera, relieved by returning to Symbicort Uses nebulizer 3 x most days. Denies glaucoma or prostatism> Discussed Spiriva as a drying option to try. Since nasal surgery by Dr Erik Obey he drains more. Nasonex better than  fluticasone. Not using an antihistamine.  Prednisone 2-3 x 5 mg daily; tries to skip it but never can. We talked again about trying Xolair- he says his wife was frightened by reported side effects. 57 yoM followed for hx of asthma, rhinitis and chronic sinusitis.  06/19/11- 67 yoM never smoker followed for hx of asthma, rhinitis and chronic sinusitis. After a very difficult half year, he says he finally feels that he is at least more stable. There is extra stress from dealing with his sick and elderly father. Tranxene has been a big help with the stress. Since he is doing less "stress eating" he has lost 10 pounds. He likes Spiriva and uses his nebulizer twice a day. We have continued him taking daily maintenance prednisone between 10 and 15 mg daily. This has caused thrush. Since the sinus surgery, he still has no sinus infection but continues using his sinus rinse. Cough continues to be productive with scant yellow. Often he can't get it to come out and will wheeze. He denies fever, sweat, sore throat, swollen glands, blood.  10/17/11- 57 yoM never smoker followed for hx of asthma, rhinitis and chronic sinusitis. Acute bronchitis exacerbation slowly resolving over 11 weeks. Hoarseness is slowly getting better. Still doing saline nasal rinse every day. Cough is always productive of mucus which is light yellow to green. Now off of antibiotics. Still taking prednisone 15 mg daily-can't get lower. Continues Symbicort.  01/21/12- 18 yoM never smoker followed for hx of asthma, rhinitis and chronic sinusitis. Can tell a big difference with taking Azithromycin once daily; not using rescue inhaler as  often now. Son has graduated from high school, removing a significant source of stress in the family. Hoarseness comes and goes, because of which he can't sing. We discussed thrush, reflux and postnasal drainage as possible contributors. He cannot get lower than 15 mg prednisone daily. Much less use of rescue  inhaler as noted. He decided side effect risk was too great to try Xolair shots. He does use AeroChamber with Symbicort. Has cut down some on Spiriva but uses nebulizer twice a day.  07/28/12- 22 yoM never smoker followed for hx of asthma, rhinitis and chronic sinusitis. FOLLOWS FOR: sick in November and had to get RX; sickness keeps going around in family; slight wheezing and SOB since. A viral syndrome respiratory infection was passing around in his family in November. We had called in Augmentin which helped. He was well until Christmas when he had another flare. Persistent cough with scant yellow sputum, no fever or blood. We had tried leaving him on maintenance Zithromax. Using 1 puff Symbicort one time daily. Continues tranxene. No longer having sinus infections since the sinus surgery but continues Flonase and uses a mask for yard work. Continues prednisone 15 mg daily; 10 mg was not enough to keep him stable.  01/27/13- 66 yoM never smoker followed for hx of asthma, rhinitis and chronic sinusitis. FOLLOWS FOR: pt reports he has had some "rough days" throughout the winter, but since warmer weather breathing is doing better; feels like he is at a baseline Weather limiting wants to work outside especially in the winter. Wears a mask to mow. Tried Cefdinir- no help. Likes azithromycin used in intervals when needed. We discussed anti-inflammatory effect. Daily nebulizer 2 or 3 times/DuoNeb. Continues other meds as discussed. CXR 07/30/12 IMPRESSION:  No edema or consolidation.  Original Report Authenticated By: Lowella Grip, M.D.  08/03/13-47 yoM never smoker followed for hx of asthma, rhinitis and chronic sinusitis.  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 Unusual exertion in cold air triggered asthma. Now on work days he takes 15 mg prednisone, on days off he takes 10 mg. Using nebulizer machine  about 3 times daily.  Review of Systems-See HPI Constitutional:    No- night sweats ,fevers, chills, fatigue, lassitude. HEENT:   No headaches,  Difficulty swallowing,  Tooth/dental problems,  Sore throat,                No sneezing, itching, ear ache, CV:  No chest pain,  Orthopnea, PND, swelling in lower extremities, anasarca, dizziness, palpitations GI  No heartburn, indigestion, abdominal pain, nausea, vomiting,  Resp:.See HPI   +Some productive cough   No coughing up of blood.  + change in color of mucus,                + wheeze Skin: no rash or lesions. GU: . MS:  No joint pain or swelling.  [name] Psych:   change in mood or affect.  depression and  anxiety.  No memory loss.  Today Objective:   Physical Exam General- Alert, Oriented, Affect-appropriate, Distress- none acute, overweight Skin- rash-none, lesions- none, excoriation- none Lymphadenopathy- none Head- atraumatic            Eyes- Gross vision intact, PERRLA, conjunctivae clear secretions            Ears- Hearing, canals normal            Nose- Clear, no-Septal dev, mucus, polyps, erosion,  perforation             Throat- Mallampati  III- IV , mucosa clear , drainage- none, tonsils- atrophic. , Neck- flexible , trachea midline, no stridor , thyroid nl, carotid no bruit Chest - symmetrical excursion , unlabored           Heart/CV- RRR , no murmur , no gallop  , no rub, nl s1 s2                           - JVD- none , edema- none, stasis changes- none, varices- none           Lung-  + wheeze-bilateral, coarse,  cough- none , dullness-none, rub- none           Chest wall-  Abd-  Br/ Gen/ Rectal- Not done, not indicated Extrem- cyanosis- none, clubbing, none, atrophy- none, strength- nl Neuro- grossly intact to observation

## 2013-08-04 NOTE — Patient Instructions (Addendum)
Ok to take your nebulizer to work if needed  [employer]- One puff then rinse mouth, one time daily   Try this instead of Symbicort to compare.  Neb here Garlon [employer]chet    See how you feel after this longer-acting treatment. It can last up to 8-12 hours, but may not be worth paying extra for.   Letter for employer

## 2013-08-23 ENCOUNTER — Telehealth: Payer: Self-pay | Admitting: Internal Medicine

## 2013-08-23 MED ORDER — HYDROCODONE-HOMATROPINE 5-1.5 MG/5ML PO SYRP: 5.0000 mL | ORAL_SOLUTION | Freq: Four times a day (QID) | ORAL | Status: DC | PRN

## 2013-08-23 NOTE — Telephone Encounter (Signed)
Ok to refill hycodan as before

## 2013-08-23 NOTE — Telephone Encounter (Signed)
Last OV 07/26/13 Pending OV 11/02/13 Last fill of Hycodan was in 08/2011  Allergies  Allergen Reactions  . Fluticasone-Salmeterol     REACTION: jaw joint pain    Current Outpatient Prescriptions on File Prior to Visit  Medication Sig Dispense Refill  . albuterol (VENTOLIN HFA) 108 (90 BASE) MCG/ACT inhaler Inhale 2 puffs into the lungs every 6 (six) hours as needed for wheezing or shortness of breath.  3 Inhaler  3  . azithromycin (ZITHROMAX) 250 MG tablet Take 250 mg by mouth daily. Long term maintenance      . budesonide-formoterol (SYMBICORT) 160-4.5 MCG/ACT inhaler Inhale 2 puffs into the lungs 2 (two) times daily. Rinse mouth  3 Inhaler  3  . clorazepate (TRANXENE) 7.5 MG tablet 1 twice daily as needed  180 tablet  3  . fluconazole (DIFLUCAN) 150 MG tablet Take 150 mg by mouth daily.      . fluticasone (FLONASE) 50 MCG/ACT nasal spray Place 2 sprays into the nose daily.  48 g  3  . Fluticasone Furoate-Vilanterol (BREO ELLIPTA) 100-25 MCG/INH AEPB Inhale 1 puff into the lungs daily. RINSE MOUTH AFTER USE  1 each  0  . ipratropium-albuterol (DUONEB) 0.5-2.5 (3) MG/3ML SOLN 1 neb 4 times daily as needed  1080 mL  3  . montelukast (SINGULAIR) 10 MG tablet 1 daily  90 tablet  3  . predniSONE (DELTASONE) 5 MG tablet 2-3 daily as needed  270 tablet  3  . promethazine-codeine (PHENERGAN WITH CODEINE) 6.25-10 MG/5ML syrup Take 5 mLs by mouth every 6 (six) hours as needed for cough.  200 mL  0  . sodium chloride (OCEAN) 0.65 % nasal spray 1 spray by Nasal route as needed.        . tiotropium (SPIRIVA HANDIHALER) 18 MCG inhalation capsule Place 1 capsule (18 mcg total) into inhaler and inhale daily.  30 capsule  prn   No current facility-administered medications on file prior to visit.    CY - please advise on refill. Thanks.

## 2013-08-23 NOTE — Telephone Encounter (Signed)
Rx will be printed for pick up. Pt's wife is aware that this will be ready to be picked up tomorrow morning.

## 2013-08-24 ENCOUNTER — Telehealth: Payer: Self-pay | Admitting: Internal Medicine

## 2013-08-24 MED ORDER — AMOXICILLIN 500 MG PO CAPS: 500.0000 mg | ORAL_CAPSULE | Freq: Three times a day (TID) | ORAL | Status: DC

## 2013-08-24 NOTE — Telephone Encounter (Signed)
Last OV 08-04-13. I spoke with the pt spouse and she states the pt was up all night coughing, cough is productive with yellow phlegm, chest congestion. She states he gets bronchitis a few times a year and if left untreated he has had pneumonia as well. She states he has not mentioned having any wheezing, or chest tightness, or increased SOB. Pt is requesting an RX for amoxicillin, states this works well for him. Please advise. [place] Bing, CMA Allergies  Allergen Reactions  . Fluticasone-Salmeterol     REACTION: jaw joint pain

## 2013-08-24 NOTE — Telephone Encounter (Signed)
Offer amox 500 mg, # 21, 1 TID

## 2013-08-24 NOTE — Telephone Encounter (Signed)
Spoke with pt's wife and advised that rx for Amoxicillin was sent to [facility] at [facility].  She verbalized understanding.

## 2013-08-28 NOTE — Assessment & Plan Note (Signed)
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 Plan-try nebulizing Brovana, sample Breo Ellipta

## 2013-09-13 ENCOUNTER — Telehealth: Payer: Self-pay | Admitting: Internal Medicine

## 2013-09-13 MED ORDER — AMOXICILLIN-POT CLAVULANATE 875-125 MG PO TABS: 1.0000 | ORAL_TABLET | Freq: Two times a day (BID) | ORAL | Status: DC

## 2013-09-13 NOTE — Telephone Encounter (Signed)
Offer augmentin 875, # 20, 1 twice daily 

## 2013-09-13 NOTE — Telephone Encounter (Signed)
Last OV 08-04-13. Pt called in on 08-24-13 and was having productive cough with green phlegm and chest congestion. Pt was p[rescribed amoxicillin. Spouse states he get better with this medication but has now begun to have symptoms again. She states that he is again having productive cough with green phlegm and chest congestion. Pt has not mentioned having any chest tightness, wheezing, or SOB. She states he works outside and feels the recent weather has something to do with these symptoms. Please advise. Lennox Bing, CMA Allergies  Allergen Reactions  . Fluticasone-Salmeterol     REACTION: jaw joint pain

## 2013-09-13 NOTE — Telephone Encounter (Signed)
Spoke with pt's wife and advised of Dr Janee Morn recommendations.  Rx sent to pharmacy.

## 2013-09-21 ENCOUNTER — Other Ambulatory Visit: Payer: Self-pay | Admitting: Internal Medicine

## 2013-09-22 NOTE — Telephone Encounter (Signed)
Ok to refill x 6 months 

## 2013-09-22 NOTE — Telephone Encounter (Signed)
CY, Please advise if okay to refill. Thanks.  

## 2013-09-22 NOTE — Telephone Encounter (Signed)
Called rx refill to pharmacy voicemail.

## 2013-11-02 ENCOUNTER — Ambulatory Visit (INDEPENDENT_AMBULATORY_CARE_PROVIDER_SITE_OTHER): Payer: BC Managed Care – PPO | Admitting: Internal Medicine

## 2013-11-02 ENCOUNTER — Encounter: Payer: Self-pay | Admitting: Internal Medicine

## 2013-11-02 VITALS — BP 112/72 | Ht 70.0 in | Wt 273.6 lb

## 2013-11-02 DIAGNOSIS — J45909 Unspecified asthma, uncomplicated: Secondary | ICD-10-CM

## 2013-11-02 MED ORDER — FLUTICASONE PROPIONATE 50 MCG/ACT NA SUSP: 2.0000 | Freq: Every day | NASAL | Status: DC

## 2013-11-02 MED ORDER — ALBUTEROL SULFATE HFA 108 (90 BASE) MCG/ACT IN AERS: 2.0000 | INHALATION_SPRAY | Freq: Four times a day (QID) | RESPIRATORY_TRACT | Status: DC | PRN

## 2013-11-02 MED ORDER — MONTELUKAST SODIUM 10 MG PO TABS: ORAL_TABLET | ORAL | Status: DC

## 2013-11-02 MED ORDER — AZITHROMYCIN 250 MG PO TABS: ORAL_TABLET | ORAL | Status: DC

## 2013-11-02 MED ORDER — PREDNISONE 5 MG PO TABS: ORAL_TABLET | ORAL | Status: DC

## 2013-11-02 MED ORDER — FLUCONAZOLE 150 MG PO TABS: 150.0000 mg | ORAL_TABLET | Freq: Every day | ORAL | Status: DC

## 2013-11-02 MED ORDER — BUDESONIDE-FORMOTEROL FUMARATE 160-4.5 MCG/ACT IN AERO: 2.0000 | INHALATION_SPRAY | Freq: Two times a day (BID) | RESPIRATORY_TRACT | Status: DC

## 2013-11-02 NOTE — Patient Instructions (Signed)
Meds refilled  We are going to stay off Spiriva  We agreed we would stop maintenance zithromax at end of May.

## 2013-11-02 NOTE — Progress Notes (Signed)
Patient ID: [name] [name], male    DOB: [birth year], 48 y.o.   MRN: [id number]  HPI 48 yoM followed for hx of asthma, rhinitis and chronic sinusitis. Last here December 26, 2009. Since then he had extensive sinus surgery and polypectomy by Dr Erik Obey 03/24/78, which did help nasal congestion. They noted the pollen issues this Spring. Slow prednisone taper ended 2 weeks ago. Off that he again got congested in chest, wheezing, short of breath interfering with sleep. We called short prednisone taper which has helped, but not enough. He continues Symbicort, Flonase, Singulair. Dr Erik Obey treated for thrush. Denies diabetes. He notes weight gain on prednisone, Skin test positive and on allergy vaccine in the past. 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  12/18/10- Asthma, rhinitis, chronic sinusitis s/p sinus surgery/ polypectomy.  We restarted prednisone 5 mg and he is using 1-3 daily. Feels "no better no worse". Duleara 200  is the same as Symbicort 160. Nasonex works better than Fluticasone. He and his wife discussed Xolair and decided not to risk it for fear of side effects. He continues to work in [employer], but says lung disease is not thought to be associated with lung disease. Nasal airway now seems great, 3 months after surgery. Still wakes once/ night to cough up white mucus. Can't breathe w/o prednisone. Feels better outdoors.   03/18/11-  Asthma, rhinitis, chronic sinusitis s/p sinus surgery/ polypectomy.  Cough remains productive, with much postnasal drip. Continue neti pot. Denies aspiration/ choke while eating. Some off and on wheeze.  Insurance wouldn't pay for [employer]. He associated back pain with Dulera, relieved by returning to Symbicort Uses nebulizer 3 x most days. Denies glaucoma or prostatism> Discussed Spiriva as a drying option to try. Since nasal surgery by Dr Erik Obey he drains more. Nasonex better than  fluticasone. Not using an antihistamine.  Prednisone 2-3 x 5 mg daily; tries to skip it but never can. We talked again about trying Xolair- he says his wife was frightened by reported side effects. 48 yoM followed for hx of asthma, rhinitis and chronic sinusitis.  06/19/11- 48 yoM never smoker followed for hx of asthma, rhinitis and chronic sinusitis. After a very difficult half year, he says he finally feels that he is at least more stable. There is extra stress from dealing with his sick and elderly father. Tranxene has been a big help with the stress. Since he is doing less "stress eating" he has lost 10 pounds. He likes Spiriva and uses his nebulizer twice a day. We have continued him taking daily maintenance prednisone between 10 and 15 mg daily. This has caused thrush. Since the sinus surgery, he still has no sinus infection but continues using his sinus rinse. Cough continues to be productive with scant yellow. Often he can't get it to come out and will wheeze. He denies fever, sweat, sore throat, swollen glands, blood.  10/17/11- 48 yoM never smoker followed for hx of asthma, rhinitis and chronic sinusitis. Acute bronchitis exacerbation slowly resolving over 11 weeks. Hoarseness is slowly getting better. Still doing saline nasal rinse every day. Cough is always productive of mucus which is light yellow to green. Now off of antibiotics. Still taking prednisone 15 mg daily-can't get lower. Continues Symbicort.  01/21/12- 48 yoM never smoker followed for hx of asthma, rhinitis and chronic sinusitis. Can tell a big difference with taking Azithromycin once daily; not using rescue inhaler as  often now. Son has graduated from high school, removing a significant source of stress in the family. Hoarseness comes and goes, because of which he can't sing. We discussed thrush, reflux and postnasal drainage as possible contributors. He cannot get lower than 15 mg prednisone daily. Much less use of rescue  inhaler as noted. He decided side effect risk was too great to try Xolair shots. He does use AeroChamber with Symbicort. Has cut down some on Spiriva but uses nebulizer twice a day.  07/28/12- 48 yoM never smoker followed for hx of asthma, rhinitis and chronic sinusitis. FOLLOWS FOR: sick in November and had to get RX; sickness keeps going around in family; slight wheezing and SOB since. A viral syndrome respiratory infection was passing around in his family in November. We had called in Augmentin which helped. He was well until Christmas when he had another flare. Persistent cough with scant yellow sputum, no fever or blood. We had tried leaving him on maintenance Zithromax. Using 1 puff Symbicort one time daily. Continues tranxene. No longer having sinus infections since the sinus surgery but continues Flonase and uses a mask for yard work. Continues prednisone 15 mg daily; 10 mg was not enough to keep him stable.  01/27/13- 48 yoM never smoker followed for hx of asthma, rhinitis and chronic sinusitis. FOLLOWS FOR: pt reports he has had some "rough days" throughout the winter, but since warmer weather breathing is doing better; feels like he is at a baseline Weather limiting wants to work outside especially in the winter. Wears a mask to mow. Tried Cefdinir- no help. Likes azithromycin used in intervals when needed. We discussed anti-inflammatory effect. Daily nebulizer 2 or 3 times/DuoNeb. Continues other meds as discussed. CXR 07/30/12 IMPRESSION:  No edema or consolidation.  Original Report Authenticated By: Lowella Grip, M.D.  08/03/13-47 yoM never smoker followed for hx of asthma, rhinitis and chronic sinusitis.  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 Unusual exertion in cold air triggered asthma. Now on work days he takes 15 mg prednisone, on days off he takes 10 mg. Using nebulizer machine  about 3 times daily.  11/02/13- 47 yoM never smoker followed for hx of asthma, rhinitis and chronic sinusitis. FOLLOWS FOR: Pt states after using Breo he got sick with URI(thinks he caught from other people-?medication causing some side effects). Was given 2 abx since last visit to help with this. Since completing the meds he feels good and wants to know if he should go back on Breo. Has not used Breo or Spriva since Jan 2015(Muscle pains went away since stopping Spriva). Continues to use Symbicort("can't live without it").  Using nebulizer at least once daily. Feels controlled now. Minor sneezing from pollen. Continues maintenance prednisone 15 mg daily and maintenance Zithromax for chronic bronchitis depression. Uses mask to mow or do yard work. 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  Review of Systems-See HPI Constitutional:    No- night sweats ,fevers, chills, fatigue, lassitude. HEENT:   No headaches,  Difficulty swallowing,  Tooth/dental problems,  Sore throat,                No sneezing, itching, ear ache, CV:  No chest pain,  Orthopnea, PND, swelling in lower extremities, anasarca, dizziness, palpitations GI  No heartburn, indigestion, abdominal pain, nausea, vomiting,  Resp:.See HPI   +Some productive cough   No coughing up of blood.  No-change in color of mucus,                + wheeze Skin: no rash or lesions. GU: . MS:  No joint pain or swelling.  [name] Psych:   change in mood or affect.  depression and  anxiety.  No memory loss.  Today Objective:   Physical Exam General- Alert, Oriented, Affect-appropriate, Distress- none acute, overweight Skin- rash-none, lesions- none, excoriation- none Lymphadenopathy- none Head- atraumatic            Eyes- Gross vision intact, PERRLA, conjunctivae clear secretions            Ears- Hearing, canals normal            Nose- Clear, no-Septal dev, mucus, polyps, erosion, perforation             Throat- Mallampati   III- IV , mucosa clear , drainage- none, tonsils- atrophic. , Neck- flexible , trachea midline, no stridor , thyroid nl, carotid no bruit Chest - symmetrical excursion , unlabored           Heart/CV- RRR , no murmur , no gallop  , no rub, nl s1 s2                           - JVD- none , edema- none, stasis changes- none, varices- none           Lung-  wheeze+trace,   cough- none , dullness-none, rub- none           Chest wall-  Abd-  Br/ Gen/ Rectal- Not done, not indicated Extrem- cyanosis- none, clubbing, none, atrophy- none, strength- nl Neuro- grossly intact to observation

## 2013-12-04 NOTE — Assessment & Plan Note (Signed)
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 Resolved exacerbation after virus infection this winter. Plan-stay off Spiriva H. Was associated with myalgias. Stop maintenance Zithromax at the end of May.

## 2014-01-14 ENCOUNTER — Other Ambulatory Visit: Payer: Self-pay | Admitting: Internal Medicine

## 2014-01-28 ENCOUNTER — Other Ambulatory Visit: Payer: Self-pay | Admitting: Internal Medicine

## 2014-02-03 ENCOUNTER — Ambulatory Visit (INDEPENDENT_AMBULATORY_CARE_PROVIDER_SITE_OTHER)
Admission: RE | Admit: 2014-02-03 | Discharge: 2014-02-03 | Disposition: A | Discharge status: Home / Self Care | Payer: BC Managed Care – PPO | Source: Ambulatory Visit | Attending: Internal Medicine | Admitting: Internal Medicine

## 2014-02-03 ENCOUNTER — Encounter: Payer: Self-pay | Admitting: Internal Medicine

## 2014-02-03 ENCOUNTER — Ambulatory Visit (INDEPENDENT_AMBULATORY_CARE_PROVIDER_SITE_OTHER): Payer: BC Managed Care – PPO | Admitting: Internal Medicine

## 2014-02-03 VITALS — BP 122/80 | HR 87 | Ht 70.0 in | Wt 278.6 lb

## 2014-02-03 DIAGNOSIS — F411 Generalized anxiety disorder: Secondary | ICD-10-CM

## 2014-02-03 DIAGNOSIS — J45909 Unspecified asthma, uncomplicated: Secondary | ICD-10-CM

## 2014-02-03 MED ORDER — CLORAZEPATE DIPOTASSIUM 7.5 MG PO TABS: ORAL_TABLET | ORAL | Status: DC

## 2014-02-03 MED ORDER — IPRATROPIUM-ALBUTEROL 0.5-2.5 (3) MG/3ML IN SOLN: RESPIRATORY_TRACT | Status: DC

## 2014-02-03 NOTE — Progress Notes (Signed)
Patient ID: [name] [name], male    DOB: [birth year], 48 y.o.   MRN: [id number]  HPI 80 yoM followed for hx of asthma, rhinitis and chronic sinusitis. Last here December 26, 2009. Since then he had extensive sinus surgery and polypectomy by Dr Erik Obey 03/24/78, which did help nasal congestion. They noted the pollen issues this Spring. Slow prednisone taper ended 2 weeks ago. Off that he again got congested in chest, wheezing, short of breath interfering with sleep. We called short prednisone taper which has helped, but not enough. He continues Symbicort, Flonase, Singulair. Dr Erik Obey treated for thrush. Denies diabetes. He notes weight gain on prednisone, Skin test positive and on allergy vaccine in the past. 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  12/18/10- Asthma, rhinitis, chronic sinusitis s/p sinus surgery/ polypectomy.  We restarted prednisone 5 mg and he is using 1-3 daily. Feels "no better no worse". Duleara 200  is the same as Symbicort 160. Nasonex works better than Fluticasone. He and his wife discussed Xolair and decided not to risk it for fear of side effects. He continues to work in [employer], but says lung disease is not thought to be associated with lung disease. Nasal airway now seems great, 3 months after surgery. Still wakes once/ night to cough up white mucus. Can't breathe w/o prednisone. Feels better outdoors.   03/18/11-  Asthma, rhinitis, chronic sinusitis s/p sinus surgery/ polypectomy.  Cough remains productive, with much postnasal drip. Continue neti pot. Denies aspiration/ choke while eating. Some off and on wheeze.  Insurance wouldn't pay for [employer]. He associated back pain with Dulera, relieved by returning to Symbicort Uses nebulizer 3 x most days. Denies glaucoma or prostatism> Discussed Spiriva as a drying option to try. Since nasal surgery by Dr Erik Obey he drains more. Nasonex better than  fluticasone. Not using an antihistamine.  Prednisone 2-3 x 5 mg daily; tries to skip it but never can. We talked again about trying Xolair- he says his wife was frightened by reported side effects. 57 yoM followed for hx of asthma, rhinitis and chronic sinusitis.  06/19/11- 67 yoM never smoker followed for hx of asthma, rhinitis and chronic sinusitis. After a very difficult half year, he says he finally feels that he is at least more stable. There is extra stress from dealing with his sick and elderly father. Tranxene has been a big help with the stress. Since he is doing less "stress eating" he has lost 10 pounds. He likes Spiriva and uses his nebulizer twice a day. We have continued him taking daily maintenance prednisone between 10 and 15 mg daily. This has caused thrush. Since the sinus surgery, he still has no sinus infection but continues using his sinus rinse. Cough continues to be productive with scant yellow. Often he can't get it to come out and will wheeze. He denies fever, sweat, sore throat, swollen glands, blood.  10/17/11- 57 yoM never smoker followed for hx of asthma, rhinitis and chronic sinusitis. Acute bronchitis exacerbation slowly resolving over 11 weeks. Hoarseness is slowly getting better. Still doing saline nasal rinse every day. Cough is always productive of mucus which is light yellow to green. Now off of antibiotics. Still taking prednisone 15 mg daily-can't get lower. Continues Symbicort.  01/21/12- 18 yoM never smoker followed for hx of asthma, rhinitis and chronic sinusitis. Can tell a big difference with taking Azithromycin once daily; not using rescue inhaler as  often now. Son has graduated from high school, removing a significant source of stress in the family. Hoarseness comes and goes, because of which he can't sing. We discussed thrush, reflux and postnasal drainage as possible contributors. He cannot get lower than 15 mg prednisone daily. Much less use of rescue  inhaler as noted. He decided side effect risk was too great to try Xolair shots. He does use AeroChamber with Symbicort. Has cut down some on Spiriva but uses nebulizer twice a day.  07/28/12- 55 yoM never smoker followed for hx of asthma, rhinitis and chronic sinusitis. FOLLOWS FOR: sick in November and had to get RX; sickness keeps going around in family; slight wheezing and SOB since. A viral syndrome respiratory infection was passing around in his family in November. We had called in Augmentin which helped. He was well until Christmas when he had another flare. Persistent cough with scant yellow sputum, no fever or blood. We had tried leaving him on maintenance Zithromax. Using 1 puff Symbicort one time daily. Continues tranxene. No longer having sinus infections since the sinus surgery but continues Flonase and uses a mask for yard work. Continues prednisone 15 mg daily; 10 mg was not enough to keep him stable.  01/27/13- 39 yoM never smoker followed for hx of asthma, rhinitis and chronic sinusitis. FOLLOWS FOR: pt reports he has had some "rough days" throughout the winter, but since warmer weather breathing is doing better; feels like he is at a baseline Weather limiting wants to work outside especially in the winter. Wears a mask to mow. Tried Cefdinir- no help. Likes azithromycin used in intervals when needed. We discussed anti-inflammatory effect. Daily nebulizer 2 or 3 times/DuoNeb. Continues other meds as discussed. CXR 07/30/12 IMPRESSION:  No edema or consolidation.  Original Report Authenticated By: Lowella Grip, M.D.  08/03/13-47 yoM never smoker followed for hx of asthma, rhinitis and chronic sinusitis.  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 Unusual exertion in cold air triggered asthma. Now on work days he takes 15 mg prednisone, on days off he takes 10 mg. Using nebulizer machine  about 3 times daily.  11/02/13- 47 yoM never smoker followed for hx of asthma, rhinitis and chronic sinusitis. FOLLOWS FOR: Pt states after using Breo he got sick with URI(thinks he caught from other people-?medication causing some side effects). Was given 2 abx since last visit to help with this. Since completing the meds he feels good and wants to know if he should go back on Breo. Has not used Breo or Spriva since Jan 2015(Muscle pains went away since stopping Spriva). Continues to use Symbicort("can't live without it").  Using nebulizer at least once daily. Feels controlled now. Minor sneezing from pollen. Continues maintenance prednisone 15 mg daily and maintenance Zithromax for chronic bronchitis depression. Uses mask to mow or do yard work. 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  02/03/14- 47 yoM never smoker followed for hx of chronic obstructive asthma, rhinitis and chronic sinusitis. FOLLOWS FOR: patient states he has not felt well since hot weather has set in.  Pt stopped Zithromax and feels like he getting sick again. Did well after last flare in April. Now blames hot weather on easy weakness and dyspnea with exertion outdoors. Not much cough or wheeze. After 2 months off of maintenance Zithromax he just restarted. Cough is chronic and productive of white to yellow nonbloody sputum. Maintenance  prednisone 15 mg daily. Using his nebulizer machine 3-4 times daily. Office Spirometry 02/03/2014-severe obstructive airways disease. FVC 3.13/62%, FEV1 2.01/50%, FEV1/FVC 0.64, FEF 25-75% 0.87/22%.  Review of Systems-See HPI Constitutional:    No- night sweats ,fevers, chills, fatigue, lassitude. HEENT:   No - headaches,  Difficulty swallowing, Tooth/dental problems, Sore throat,                No sneezing, itching, ear ache, CV:  No chest pain,  Orthopnea, PND, swelling in lower extremities, anasarca, dizziness, palpitations GI  No heartburn, indigestion,  abdominal pain, nausea, vomiting,  Resp:.See HPI   +Some productive cough   No coughing up of blood.  No-change in color of mucus,                + wheeze Skin: no rash or lesions. GU: . MS:  No joint pain or swelling.  [name] Psych:   change in mood or affect.  depression and  anxiety.  No memory loss.  Today Objective:   Physical Exam General- Alert, Oriented, Affect-appropriate, Distress- none acute, overweight Skin- rash-none, lesions- none, excoriation- none Lymphadenopathy- none Head- atraumatic            Eyes- Gross vision intact, PERRLA, conjunctivae clear secretions            Ears- Hearing, canals normal            Nose- Clear, no-Septal dev, mucus, polyps, erosion, perforation             Throat- Mallampati  III- IV , mucosa clear , drainage- none, tonsils- atrophic. , Neck- flexible , trachea midline, no stridor , thyroid nl, carotid no bruit Chest - symmetrical excursion , unlabored           Heart/CV- RRR , no murmur , no gallop  , no rub, nl s1 s2                           - JVD- none , edema- none, stasis changes- none, varices- none           Lung-  Wheeze+light, unlabored,   Cough+ dry , dullness-none, rub- none           Chest wall-  Abd-  Br/ Gen/ Rectal- Not done, not indicated Extrem- cyanosis- none, clubbing, none, atrophy- none, strength- nl Neuro- grossly intact to observation

## 2014-02-03 NOTE — Patient Instructions (Signed)
Refill scripts   We will see how you you do with resumption of the zithromax  Suggest you try using the Symbicort 2 puffs TWICE daily x 2 weeks. See if that works any better than using it once daily  Order- CXR dx asthma with bronchitis  Order- Office spirometry

## 2014-02-06 DIAGNOSIS — F411 Generalized anxiety disorder: Secondary | ICD-10-CM | POA: Insufficient documentation

## 2014-02-06 NOTE — Assessment & Plan Note (Signed)
Plan-refill Tranxene with discussion

## 2014-02-06 NOTE — Assessment & Plan Note (Signed)
Steroid dependent and requiring daily use of nebulizer machine. We have used maintenance Zithromax to suppress chronic bronchitis and it seemed to help some seasonally. Plan-chest x-ray, refill DuoNeb

## 2014-05-31 ENCOUNTER — Ambulatory Visit: Payer: BC Managed Care – PPO | Admitting: Internal Medicine

## 2014-07-27 ENCOUNTER — Ambulatory Visit (INDEPENDENT_AMBULATORY_CARE_PROVIDER_SITE_OTHER): Payer: BLUE CROSS/BLUE SHIELD | Admitting: Internal Medicine

## 2014-07-27 ENCOUNTER — Encounter: Payer: Self-pay | Admitting: Internal Medicine

## 2014-07-27 VITALS — BP 128/78 | HR 97 | Ht 70.0 in | Wt 276.0 lb

## 2014-07-27 DIAGNOSIS — J4541 Moderate persistent asthma with (acute) exacerbation: Secondary | ICD-10-CM

## 2014-07-27 DIAGNOSIS — Z23 Encounter for immunization: Secondary | ICD-10-CM

## 2014-07-27 MED ORDER — PREDNISONE 5 MG PO TABS: ORAL_TABLET | ORAL | Status: DC

## 2014-07-27 MED ORDER — IPRATROPIUM-ALBUTEROL 0.5-2.5 (3) MG/3ML IN SOLN: RESPIRATORY_TRACT | Status: DC

## 2014-07-27 MED ORDER — FLUTICASONE PROPIONATE 50 MCG/ACT NA SUSP: 2.0000 | Freq: Every day | NASAL | Status: DC

## 2014-07-27 MED ORDER — PROMETHAZINE-CODEINE 6.25-10 MG/5ML PO SYRP: 5.0000 mL | ORAL_SOLUTION | Freq: Four times a day (QID) | ORAL | Status: DC | PRN

## 2014-07-27 MED ORDER — MONTELUKAST SODIUM 10 MG PO TABS: ORAL_TABLET | ORAL | Status: DC

## 2014-07-27 MED ORDER — AZITHROMYCIN 250 MG PO TABS: ORAL_TABLET | ORAL | Status: DC

## 2014-07-27 MED ORDER — FLUCONAZOLE 100 MG PO TABS: 100.0000 mg | ORAL_TABLET | Freq: Every day | ORAL | Status: DC

## 2014-07-27 MED ORDER — CLORAZEPATE DIPOTASSIUM 7.5 MG PO TABS: ORAL_TABLET | ORAL | Status: DC

## 2014-07-27 MED ORDER — BUDESONIDE-FORMOTEROL FUMARATE 160-4.5 MCG/ACT IN AERO: 2.0000 | INHALATION_SPRAY | Freq: Two times a day (BID) | RESPIRATORY_TRACT | Status: DC

## 2014-07-27 MED ORDER — ALBUTEROL SULFATE HFA 108 (90 BASE) MCG/ACT IN AERS: 2.0000 | INHALATION_SPRAY | Freq: Four times a day (QID) | RESPIRATORY_TRACT | Status: DC | PRN

## 2014-07-27 NOTE — Progress Notes (Signed)
Patient ID: [name] [name], male    DOB: [birth year], 49 y.o.   MRN: [id number]  HPI 80 yoM followed for hx of asthma, rhinitis and chronic sinusitis. Last here December 26, 2009. Since then he had extensive sinus surgery and polypectomy by Dr Erik Obey 03/24/78, which did help nasal congestion. They noted the pollen issues this Spring. Slow prednisone taper ended 2 weeks ago. Off that he again got congested in chest, wheezing, short of breath interfering with sleep. We called short prednisone taper which has helped, but not enough. He continues Symbicort, Flonase, Singulair. Dr Erik Obey treated for thrush. Denies diabetes. He notes weight gain on prednisone, Skin test positive and on allergy vaccine in the past. 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  12/18/10- Asthma, rhinitis, chronic sinusitis s/p sinus surgery/ polypectomy.  We restarted prednisone 5 mg and he is using 1-3 daily. Feels "no better no worse". Duleara 200  is the same as Symbicort 160. Nasonex works better than Fluticasone. He and his wife discussed Xolair and decided not to risk it for fear of side effects. He continues to work in [employer], but says lung disease is not thought to be associated with lung disease. Nasal airway now seems great, 3 months after surgery. Still wakes once/ night to cough up white mucus. Can't breathe w/o prednisone. Feels better outdoors.   03/18/11-  Asthma, rhinitis, chronic sinusitis s/p sinus surgery/ polypectomy.  Cough remains productive, with much postnasal drip. Continue neti pot. Denies aspiration/ choke while eating. Some off and on wheeze.  Insurance wouldn't pay for [employer]. He associated back pain with Dulera, relieved by returning to Symbicort Uses nebulizer 3 x most days. Denies glaucoma or prostatism> Discussed Spiriva as a drying option to try. Since nasal surgery by Dr Erik Obey he drains more. Nasonex better than  fluticasone. Not using an antihistamine.  Prednisone 2-3 x 5 mg daily; tries to skip it but never can. We talked again about trying Xolair- he says his wife was frightened by reported side effects. 57 yoM followed for hx of asthma, rhinitis and chronic sinusitis.  06/19/11- 67 yoM never smoker followed for hx of asthma, rhinitis and chronic sinusitis. After a very difficult half year, he says he finally feels that he is at least more stable. There is extra stress from dealing with his sick and elderly father. Tranxene has been a big help with the stress. Since he is doing less "stress eating" he has lost 10 pounds. He likes Spiriva and uses his nebulizer twice a day. We have continued him taking daily maintenance prednisone between 10 and 15 mg daily. This has caused thrush. Since the sinus surgery, he still has no sinus infection but continues using his sinus rinse. Cough continues to be productive with scant yellow. Often he can't get it to come out and will wheeze. He denies fever, sweat, sore throat, swollen glands, blood.  10/17/11- 57 yoM never smoker followed for hx of asthma, rhinitis and chronic sinusitis. Acute bronchitis exacerbation slowly resolving over 11 weeks. Hoarseness is slowly getting better. Still doing saline nasal rinse every day. Cough is always productive of mucus which is light yellow to green. Now off of antibiotics. Still taking prednisone 15 mg daily-can't get lower. Continues Symbicort.  01/21/12- 18 yoM never smoker followed for hx of asthma, rhinitis and chronic sinusitis. Can tell a big difference with taking Azithromycin once daily; not using rescue inhaler as  often now. Son has graduated from high school, removing a significant source of stress in the family. Hoarseness comes and goes, because of which he can't sing. We discussed thrush, reflux and postnasal drainage as possible contributors. He cannot get lower than 15 mg prednisone daily. Much less use of rescue  inhaler as noted. He decided side effect risk was too great to try Xolair shots. He does use AeroChamber with Symbicort. Has cut down some on Spiriva but uses nebulizer twice a day.  07/28/12- 55 yoM never smoker followed for hx of asthma, rhinitis and chronic sinusitis. FOLLOWS FOR: sick in November and had to get RX; sickness keeps going around in family; slight wheezing and SOB since. A viral syndrome respiratory infection was passing around in his family in November. We had called in Augmentin which helped. He was well until Christmas when he had another flare. Persistent cough with scant yellow sputum, no fever or blood. We had tried leaving him on maintenance Zithromax. Using 1 puff Symbicort one time daily. Continues tranxene. No longer having sinus infections since the sinus surgery but continues Flonase and uses a mask for yard work. Continues prednisone 15 mg daily; 10 mg was not enough to keep him stable.  01/27/13- 39 yoM never smoker followed for hx of asthma, rhinitis and chronic sinusitis. FOLLOWS FOR: pt reports he has had some "rough days" throughout the winter, but since warmer weather breathing is doing better; feels like he is at a baseline Weather limiting wants to work outside especially in the winter. Wears a mask to mow. Tried Cefdinir- no help. Likes azithromycin used in intervals when needed. We discussed anti-inflammatory effect. Daily nebulizer 2 or 3 times/DuoNeb. Continues other meds as discussed. CXR 07/30/12 IMPRESSION:  No edema or consolidation.  Original Report Authenticated By: Lowella Grip, M.D.  08/03/13-47 yoM never smoker followed for hx of asthma, rhinitis and chronic sinusitis.  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 Unusual exertion in cold air triggered asthma. Now on work days he takes 15 mg prednisone, on days off he takes 10 mg. Using nebulizer machine  about 3 times daily.  11/02/13- 47 yoM never smoker followed for hx of asthma, rhinitis and chronic sinusitis. FOLLOWS FOR: Pt states after using Breo he got sick with URI(thinks he caught from other people-?medication causing some side effects). Was given 2 abx since last visit to help with this. Since completing the meds he feels good and wants to know if he should go back on Breo. Has not used Breo or Spriva since Jan 2015(Muscle pains went away since stopping Spriva). Continues to use Symbicort("can't live without it").  Using nebulizer at least once daily. Feels controlled now. Minor sneezing from pollen. Continues maintenance prednisone 15 mg daily and maintenance Zithromax for chronic bronchitis depression. Uses mask to mow or do yard work. 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  02/03/14- 47 yoM never smoker followed for hx of chronic obstructive asthma, rhinitis and chronic sinusitis. FOLLOWS FOR: patient states he has not felt well since hot weather has set in.  Pt stopped Zithromax and feels like he getting sick again. Did well after last flare in April. Now blames hot weather on easy weakness and dyspnea with exertion outdoors. Not much cough or wheeze. After 2 months off of maintenance Zithromax he just restarted. Cough is chronic and productive of white to yellow nonbloody sputum. Maintenance  prednisone 15 mg daily. Using his nebulizer machine 3-4 times daily. Office Spirometry 02/03/2014-severe obstructive airways disease. FVC 3.13/62%, FEV1 2.01/50%, FEV1/FVC 0.64, FEF 25-75% 0.87/22%.  07/27/14- 15 yoM never smoker followed for hx of chronic obstructive asthma, rhinitis and chronic sinusitis. Maintenance prednisone Stopped maintenance zithromax- use for acute infections FOLLOWS FOR: Pt reports having cough in November-used Zithromax to treat upper respiratory issues. Pt states that he is still having cough(decreased). Pt reports using nebulizer and  albuterol hfa more frequent.  Exacerbation of bronchitis with increased cough. Not using Spiriva and has been using Symbicort only once daily because of cost. Medications reviewed CXR 02/03/14 IMPRESSION: Negative exam. Electronically Signed  By: Inge Rise M.D.  On: 02/03/2014 14:00  Review of Systems-See HPI Constitutional:    No- night sweats ,fevers, chills, fatigue, lassitude. HEENT:   No - headaches,  Difficulty swallowing, Tooth/dental problems, Sore throat,                No sneezing, itching, ear ache, CV:  No chest pain,  Orthopnea, PND, swelling in lower extremities, anasarca, dizziness, palpitations GI  No heartburn, indigestion, abdominal pain, nausea, vomiting,  Resp:.See HPI   +Some productive cough   No coughing up of blood.  No-change in color of mucus,                + wheeze Skin: no rash or lesions. GU: . MS:  No joint pain or swelling.  [name] Psych:   change in mood or affect.  depression and  anxiety.  No memory loss.  Today Objective:   Physical Exam General- Alert, Oriented, Affect-appropriate, Distress- none acute, overweight Skin- rash-none, lesions- none, excoriation- none Lymphadenopathy- none Head- atraumatic            Eyes- Gross vision intact, PERRLA, conjunctivae clear secretions            Ears- Hearing, canals normal            Nose- Clear, no-Septal dev, mucus, polyps, erosion, perforation             Throat- Mallampati  III- IV , mucosa clear , drainage- none, tonsils- atrophic. , Neck- flexible , trachea midline, no stridor , thyroid nl, carotid no bruit Chest - symmetrical excursion , unlabored           Heart/CV- RRR , no murmur , no gallop  , no rub, nl s1 s2                           - JVD- none , edema- none, stasis changes- none, varices- none           Lung-  Wheeze+light, + few crackles right lung, unlabored,   Cough+ dry , dullness-none, rub- none           Chest wall-  Abd-  Br/ Gen/ Rectal- Not done, not indicated Extrem-  cyanosis- none, clubbing, none, atrophy- none, strength- nl Neuro- grossly intact to observation

## 2014-07-27 NOTE — Patient Instructions (Addendum)
Meds refilled with script for diflucan  Pneumovax 23 vaccine

## 2014-09-11 NOTE — Assessment & Plan Note (Signed)
Exacerbation. Plan-cough syrup, medication options reviewed and instructed. Compare with his insurance formulary. He asks Diflucan because of thrush on prednisone.

## 2014-10-04 ENCOUNTER — Telehealth: Payer: Self-pay | Admitting: Internal Medicine

## 2014-10-04 MED ORDER — AMOXICILLIN-POT CLAVULANATE 875-125 MG PO TABS: 1.0000 | ORAL_TABLET | Freq: Two times a day (BID) | ORAL | Status: DC

## 2014-10-04 NOTE — Telephone Encounter (Signed)
Called pt. C/o nasal/chest cong, prod cough (white-yellow phlem), PND, chills, ears feel clogged x Sunday. He has taken nyquil gel capsuls. Wants a strong ABX per pt. Please advise CDY thanks  Allergies  Allergen Reactions  . Fluticasone-Salmeterol     REACTION: jaw joint pain     Current Outpatient Prescriptions on File Prior to Visit  Medication Sig Dispense Refill  . albuterol (VENTOLIN HFA) 108 (90 BASE) MCG/ACT inhaler Inhale 2 puffs into the lungs every 6 (six) hours as needed for wheezing or shortness of breath. 3 Inhaler 3  . azithromycin (ZITHROMAX) 250 MG tablet 2 today then one daily for infection as directed 90 tablet 0  . budesonide-formoterol (SYMBICORT) 160-4.5 MCG/ACT inhaler Inhale 2 puffs into the lungs 2 (two) times daily. Rinse mouth 3 Inhaler 3  . clorazepate (TRANXENE) 7.5 MG tablet TAKE 1 TABLET BY MOUTH TWICE A DAY AS NEEDED 180 tablet 3  . fluconazole (DIFLUCAN) 100 MG tablet Take 1 tablet (100 mg total) by mouth daily. 7 tablet 2  . fluticasone (FLONASE) 50 MCG/ACT nasal spray Place 2 sprays into both nostrils daily. 48 g 3  . ipratropium-albuterol (DUONEB) 0.5-2.5 (3) MG/3ML SOLN 1 neb 4 times daily as needed 1080 mL 3  . montelukast (SINGULAIR) 10 MG tablet 1 daily 90 tablet 3  . predniSONE (DELTASONE) 5 MG tablet 2-3 daily as needed 270 tablet 3  . promethazine-codeine (PHENERGAN WITH CODEINE) 6.25-10 MG/5ML syrup Take 5 mLs by mouth every 6 (six) hours as needed for cough. 200 mL 0  . sodium chloride (OCEAN) 0.65 % nasal spray 1 spray by Nasal route as needed.       No current facility-administered medications on file prior to visit.

## 2014-10-04 NOTE — Telephone Encounter (Signed)
Augmentin 875, # 14, 1 twice daily, ref x 1

## 2014-10-04 NOTE — Telephone Encounter (Signed)
Called and spoke to pt. Informed pt of the recs per CY. Rx sent to preferred pharmacy. Pt verbalized understanding and denied any further questions or concerns at this time.  

## 2014-12-14 ENCOUNTER — Telehealth: Payer: Self-pay | Admitting: Family Medicine

## 2014-12-14 ENCOUNTER — Encounter (HOSPITAL_COMMUNITY): Payer: Self-pay | Admitting: Emergency Medicine

## 2014-12-14 ENCOUNTER — Emergency Department (INDEPENDENT_AMBULATORY_CARE_PROVIDER_SITE_OTHER): Payer: Worker's Compensation

## 2014-12-14 ENCOUNTER — Emergency Department (INDEPENDENT_AMBULATORY_CARE_PROVIDER_SITE_OTHER)
Admission: EM | Admit: 2014-12-14 | Discharge: 2014-12-14 | Disposition: A | Discharge status: Home / Self Care | Payer: Worker's Compensation | Source: Home / Self Care | Attending: Family Medicine | Admitting: Family Medicine

## 2014-12-14 DIAGNOSIS — W57XXXA Bitten or stung by nonvenomous insect and other nonvenomous arthropods, initial encounter: Secondary | ICD-10-CM

## 2014-12-14 DIAGNOSIS — T148 Other injury of unspecified body region: Secondary | ICD-10-CM | POA: Diagnosis not present

## 2014-12-14 DIAGNOSIS — M546 Pain in thoracic spine: Secondary | ICD-10-CM

## 2014-12-14 DIAGNOSIS — B354 Tinea corporis: Secondary | ICD-10-CM

## 2014-12-14 MED ORDER — CYCLOBENZAPRINE HCL 10 MG PO TABS: 10.0000 mg | ORAL_TABLET | Freq: Every evening | ORAL | Status: DC | PRN

## 2014-12-14 MED ORDER — NAPROXEN 500 MG PO TABS: 500.0000 mg | ORAL_TABLET | Freq: Two times a day (BID) | ORAL | Status: DC

## 2014-12-14 MED ORDER — DOXYCYCLINE HYCLATE 100 MG PO CAPS: 100.0000 mg | ORAL_CAPSULE | Freq: Two times a day (BID) | ORAL | Status: DC

## 2014-12-14 NOTE — ED Provider Notes (Signed)
[name] [name] is a 49 y.o. male who presents to Urgent Care today for thoracic back pain radiating to the arms bilaterally present for a few weeks. Symptoms occurred after he had to lift a very heavy patient. Around the same time he suffered a tick bite to his right knee. Additionally he has a rash on his chest is been present for months. No fevers chills nausea vomiting or diarrhea. He's tried some old azithromycin which did not help. He denies any weakness or numbness to his arms. He denies any neck pain.   Past Medical History  Diagnosis Date  . ALLERGIC RHINITIS   . Asthma, mild intermittent   . Allergic conjunctivitis   . Kidney stone    Past Surgical History  Procedure Laterality Date  . Wisdom tooth extraction    . Lithotripsy     History  Substance Use Topics  . Smoking status: Never Smoker   . Smokeless tobacco: Never Used  . Alcohol Use: Not on file   ROS as above Medications: No current facility-administered medications for this encounter.   Current Outpatient Prescriptions  Medication Sig Dispense Refill  . albuterol (VENTOLIN HFA) 108 (90 BASE) MCG/ACT inhaler Inhale 2 puffs into the lungs every 6 (six) hours as needed for wheezing or shortness of breath. 3 Inhaler 3  . amoxicillin-clavulanate (AUGMENTIN) 875-125 MG per tablet Take 1 tablet by mouth 2 (two) times daily. 14 tablet 0  . azithromycin (ZITHROMAX) 250 MG tablet 2 today then one daily for infection as directed 90 tablet 0  . budesonide-formoterol (SYMBICORT) 160-4.5 MCG/ACT inhaler Inhale 2 puffs into the lungs 2 (two) times daily. Rinse mouth 3 Inhaler 3  . clorazepate (TRANXENE) 7.5 MG tablet TAKE 1 TABLET BY MOUTH TWICE A DAY AS NEEDED 180 tablet 3  . cyclobenzaprine (FLEXERIL) 10 MG tablet Take 1 tablet (10 mg total) by mouth at bedtime as needed for muscle spasms. 20 tablet 0  . doxycycline (VIBRAMYCIN) 100 MG capsule Take 1 capsule (100 mg total) by mouth 2 (two) times daily. 20 capsule 0  .  fluconazole (DIFLUCAN) 100 MG tablet Take 1 tablet (100 mg total) by mouth daily. 7 tablet 2  . fluticasone (FLONASE) 50 MCG/ACT nasal spray Place 2 sprays into both nostrils daily. 48 g 3  . ipratropium-albuterol (DUONEB) 0.5-2.5 (3) MG/3ML SOLN 1 neb 4 times daily as needed 1080 mL 3  . montelukast (SINGULAIR) 10 MG tablet 1 daily 90 tablet 3  . naproxen (NAPROSYN) 500 MG tablet Take 1 tablet (500 mg total) by mouth 2 (two) times daily. 30 tablet 0  . predniSONE (DELTASONE) 5 MG tablet 2-3 daily as needed 270 tablet 3  . promethazine-codeine (PHENERGAN WITH CODEINE) 6.25-10 MG/5ML syrup Take 5 mLs by mouth every 6 (six) hours as needed for cough. 200 mL 0  . sodium chloride (OCEAN) 0.65 % nasal spray 1 spray by Nasal route as needed.       Allergies  Allergen Reactions  . Fluticasone-Salmeterol     REACTION: jaw joint pain     Exam:  BP 127/84 mmHg  Pulse 81  Temp(Src) 98.4 F (36.9 C) (Oral)  Resp 16  SpO2 100% Gen: Well NAD HEENT: EOMI,  MMM no oral lesions Lungs: Normal work of breathing. CTABL Heart: RRR no MRG Abd: NABS, Soft. Nondistended, Nontender Exts: Brisk capillary refill, warm and well perfused.  Neck: Nontender to midline normal neck range of motion negative Spurling's test up her extremity strength is equal and normal bilaterally.  Next and reflexes are equal and normal bilateral upper extremities. Sensation is intact throughout Thoracic spine nontender to midline mildly tender bilateral thoracic paraspinals.  Skin: Flat macular erythematous annular lesions on chest wall. 2 small erythematous papules right posterior knee  ED ECG REPORT   Date: 12/14/2014  Rate: 79  Rhythm: normal sinus rhythm  QRS Axis: normal  Intervals: normal  ST/T Wave abnormalities: normal  Conduction Disutrbances:none  Narrative Interpretation:   Old EKG Reviewed: none available  I have personally reviewed the EKG tracing and agree with the computerized printout as noted.   No  results found for this or any previous visit (from the past 24 hour(s)). Dg Chest 2 View  12/14/2014   CLINICAL DATA:  back pain, abdominal pain  EXAM: CHEST  2 VIEW  COMPARISON:  02/03/2014  FINDINGS: Cardiomediastinal silhouette is stable. Mild elevation of the right hemidiaphragm again noted. No acute infiltrate or pleural effusion. No pulmonary edema. Bony thorax is unremarkable.  IMPRESSION: No active cardiopulmonary disease.   Electronically Signed   By: Lahoma Crocker M.D.   On: 12/14/2014 11:47    Assessment and Plan: 49 y.o. male with  1) thoracic pain likely due to myofascial disruption. Treat with Flexeril, naproxen and follow up with sports medicine. 2) tick bite: Patient does have some muscle pain. Tickborne illness may be an etiology here. Treat with doxycycline 3) chest rash likely tinea corporis. Treat with topical Lamisil cream.  Discussed warning signs or symptoms. Please see discharge instructions. Patient expresses understanding.     Gregor Hams, MD 12/14/14 [phone]

## 2014-12-14 NOTE — ED Notes (Signed)
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

## 2014-12-14 NOTE — Telephone Encounter (Signed)
Patient was in urgent care today and was referred to Dr. Tamala Julian. He came into office to schedule with him and he is a new patient. He's wondering if you would be able to get him in sooner that 6/9. The doctor was only able to give him a work note for light duty for 3 days and wondering if Dr. Tamala Julian would be able to write him something before his appointment. Please advise patient

## 2014-12-14 NOTE — Telephone Encounter (Signed)
Spoke to pt, scheduled for 6.1.16 @ 11am.

## 2014-12-14 NOTE — Discharge Instructions (Signed)
Thank you for coming in today. 1) for back pain take naproxen and Flexeril. Use a heating pad. Follow up with Dr. Tamala Julian 2) for tick bite take doxycycline 3) for rash use over-the-counter Lamisil cream twice daily for 3 weeks.   Come back or go to the emergency room if you notice new weakness new numbness problems walking or bowel or bladder problems.   Back Exercises Back exercises help treat and prevent back injuries. The goal of back exercises is to increase the strength of your abdominal and back muscles and the flexibility of your back. These exercises should be started when you no longer have back pain. Back exercises include:  Pelvic Tilt. Lie on your back with your knees bent. Tilt your pelvis until the lower part of your back is against the floor. Hold this position 5 to 10 sec and repeat 5 to 10 times.  Knee to Chest. Pull first 1 knee up against your chest and hold for 20 to 30 seconds, repeat this with the other knee, and then both knees. This may be done with the other leg straight or bent, whichever feels better.  Sit-Ups or Curl-Ups. Bend your knees 90 degrees. Start with tilting your pelvis, and do a partial, slow sit-up, lifting your trunk only 30 to 45 degrees off the floor. Take at least 2 to 3 seconds for each sit-up. Do not do sit-ups with your knees out straight. If partial sit-ups are difficult, simply do the above but with only tightening your abdominal muscles and holding it as directed.  Hip-Lift. Lie on your back with your knees flexed 90 degrees. Push down with your feet and shoulders as you raise your hips a couple inches off the floor; hold for 10 seconds, repeat 5 to 10 times.  Back arches. Lie on your stomach, propping yourself up on bent elbows. Slowly press on your hands, causing an arch in your low back. Repeat 3 to 5 times. Any initial stiffness and discomfort should lessen with repetition over time.  Shoulder-Lifts. Lie face down with arms beside your body.  Keep hips and torso pressed to floor as you slowly lift your head and shoulders off the floor. Do not overdo your exercises, especially in the beginning. Exercises may cause you some mild back discomfort which lasts for a few minutes; however, if the pain is more severe, or lasts for more than 15 minutes, do not continue exercises until you see your caregiver. Improvement with exercise therapy for back problems is slow.  See your caregivers for assistance with developing a proper back exercise program. Document Released: 08/15/2004 Document Revised: 09/30/2011 Document Reviewed: 05/09/2011 [facility] Patient Information 2015 [place], [employer]. This information is not intended to replace advice given to you by your health care provider. Make sure you discuss any questions you have with your health care provider.

## 2014-12-21 ENCOUNTER — Encounter: Payer: Self-pay | Admitting: Family Medicine

## 2014-12-21 ENCOUNTER — Other Ambulatory Visit: Payer: Self-pay | Admitting: Family Medicine

## 2014-12-21 ENCOUNTER — Ambulatory Visit (INDEPENDENT_AMBULATORY_CARE_PROVIDER_SITE_OTHER)
Admission: RE | Admit: 2014-12-21 | Discharge: 2014-12-21 | Disposition: A | Discharge status: Home / Self Care | Payer: Worker's Compensation | Source: Ambulatory Visit | Attending: Family Medicine | Admitting: Family Medicine

## 2014-12-21 ENCOUNTER — Encounter: Payer: Self-pay | Admitting: *Deleted

## 2014-12-21 ENCOUNTER — Ambulatory Visit (INDEPENDENT_AMBULATORY_CARE_PROVIDER_SITE_OTHER): Payer: Worker's Compensation | Admitting: Family Medicine

## 2014-12-21 VITALS — BP 134/84 | HR 91 | Ht 70.0 in | Wt 277.0 lb

## 2014-12-21 DIAGNOSIS — M549 Dorsalgia, unspecified: Secondary | ICD-10-CM

## 2014-12-21 NOTE — Patient Instructions (Addendum)
Good to meet you Ice 20 minutes 2 times daily. Usually after activity and before bed. Exercises 3 times a week.  Turmeric 500mg  twice daily Stop the naproxen!!!!! Vitamin D 2000 IU daily Xray downstairs today See me again in 2-3 weeks   Standing:  Secure a rubber exercise band/tubing so that it is at the height of your shoulders when you are either standing or sitting on a firm arm-less chair.  Grasp an end of the band/tubing in each hand and have your palms face each other. Straighten your elbows and lift your hands straight in front of you at shoulder height. Step back away from the secured end of band/tubing until it becomes tense.  Squeeze your shoulder blades together. Keeping your elbows locked and your hands at shoulder-height, bring your hands out to your side.  Hold __________ seconds. Slowly ease the tension on the band/tubing as you reverse the directions and return to the starting position. Repeat __________ times. Complete this exercise __________ times per day. STRENGTH - Scapular Retractors  Secure a rubber exercise band/tubing so that it is at the height of your shoulders when you are either standing or sitting on a firm arm-less chair.  With a palm-down grip, grasp an end of the band/tubing in each hand. Straighten your elbows and lift your hands straight in front of you at shoulder height. Step back away from the secured end of band/tubing until it becomes tense.  Squeezing your shoulder blades together, draw your elbows back as you bend them. Keep your upper arm lifted away from your body throughout the exercise.  Hold __________ seconds. Slowly ease the tension on the band/tubing as you reverse the directions and return to the starting position. Repeat __________ times. Complete this exercise __________ times per day. STRENGTH - Shoulder Extensors   Secure a rubber exercise band/tubing so that it is at the height of your shoulders when you are either standing or  sitting on a firm arm-less chair.  With a thumbs-up grip, grasp an end of the band/tubing in each hand. Straighten your elbows and lift your hands straight in front of you at shoulder height. Step back away from the secured end of band/tubing until it becomes tense.  Squeezing your shoulder blades together, pull your hands down to the sides of your thighs. Do not allow your hands to go behind you.  Hold for __________ seconds. Slowly ease the tension on the band/tubing as you reverse the directions and return to the starting position. Repeat __________ times. Complete this exercise __________ times per day.  STRENGTH - Scapular Retractors and External Rotators  Secure a rubber exercise band/tubing so that it is at the height of your shoulders when you are either standing or sitting on a firm arm-less chair.  With a palm-down grip, grasp an end of the band/tubing in each hand. Bend your elbows 90 degrees and lift your elbows to shoulder height at your sides. Step back away from the secured end of band/tubing until it becomes tense.  Squeezing your shoulder blades together, rotate your shoulder so that your upper arm and elbow remain stationary, but your fists travel upward to head-height.  Hold __________ for seconds. Slowly ease the tension on the band/tubing as you reverse the directions and return to the starting position. Repeat __________ times. Complete this exercise __________ times per day.  STRENGTH - Scapular Retractors and External Rotators, Rowing  Secure a rubber exercise band/tubing so that it is at the height of your shoulders  when you are either standing or sitting on a firm arm-less chair.  With a palm-down grip, grasp an end of the band/tubing in each hand. Straighten your elbows and lift your hands straight in front of you at shoulder height. Step back away from the secured end of band/tubing until it becomes tense.  Step 1: Squeeze your shoulder blades together. Bending  your elbows, draw your hands to your chest as if you are rowing a boat. At the end of this motion, your hands and elbow should be at shoulder-height and your elbows should be out to your sides.  Step 2: Rotate your shoulder to raise your hands above your head. Your forearms should be vertical and your upper-arms should be horizontal.  Hold for __________ seconds. Slowly ease the tension on the band/tubing as you reverse the directions and return to the starting position. Repeat __________ times. Complete this exercise __________ times per day.  STRENGTH - Scapular Retractors and Elevators  Secure a rubber exercise band/tubing so that it is at the height of your shoulders when you are either standing or sitting on a firm arm-less chair.  With a thumbs-up grip, grasp an end of the band/tubing in each hand. Step back away from the secured end of band/tubing until it becomes tense.  Squeezing your shoulder blades together, straighten your elbows and lift your hands straight over your head.  Hold for __________ seconds. Slowly ease the tension on the band/tubing as you reverse the directions and return to the starting position. Repeat __________ times. Complete this exercise __________ times per day.  Document Released: 07/08/2005 Document Revised: 09/30/2011 Document Reviewed: 10/20/2008 [facility] Patient Information 2015 [place], [place]. This information is not intended to replace advice given to you by your health care provider. Make sure you discuss any questions you have with your health care provider.

## 2014-12-21 NOTE — Progress Notes (Signed)
[name] [address], [place] [address] Phone: [phone] Subjective:    I'm seeing this patient by the request  of:  Georgina Snell MD   CC: Thoracic back pain  [id number] KEYVIN RISON is a 49 y.o. male coming in with complaint of mid back pain. Patient was seen in urgent care and was given a muscle relaxer as well as an anti-inflammatory. Patient states he is had this pain for multiple weeks and sometimes does have radiation going down the arms bilaterally. Patient notices it after he tried to lift something very heavy. This was a 600 pound man. Patient states that immediately had pain in his back as well as his chest. Patient states that this was more of a dull throbbing pain that seemed to get worse over the course of the week. It has now been nearly 5 weeks since injury. Patient was originally seen in urgent care as stated above. Patient had a chest x-ray which is fairly unremarkable for any bony abnormality. Patient states unfortunately he continues to have some discomfort and he continues to be on light duty. Patient states that he does not take the anti-inflammatory or the muscle relaxer the pain can be significant. Patient states though that every day may be improving but it is difficult to tell. Denies any nighttime pain. Denies any weakness or any recurrent numbness in the extremities. Patient is concerned though because he wants [place] to not be a chronic problem. This did happen while he was working.     Past Medical History  Diagnosis Date  . ALLERGIC RHINITIS   . Asthma, mild intermittent   . Allergic conjunctivitis   . Kidney stone    Past Surgical History  Procedure Laterality Date  . Wisdom tooth extraction    . Lithotripsy     History  Substance Use Topics  . Smoking status: Never Smoker   . Smokeless tobacco: Never Used  . Alcohol Use: Not on file   Family History  Problem Relation Age of Onset  . Asthma Father   . Other  Father     bronchitis   Allergies  Allergen Reactions  . Fluticasone-Salmeterol     REACTION: jaw joint pain     Past medical history, social, surgical and family history all reviewed in electronic medical record.   Review of Systems: No headache, visual changes, nausea, vomiting, diarrhea, constipation, dizziness, abdominal pain, skin rash, fevers, chills, night sweats, weight loss, swollen lymph nodes, body aches, joint swelling, muscle aches, chest pain, shortness of breath, mood changes.   Objective Blood pressure 134/84, pulse 91, height 5\' 10"  (1.778 m), weight 277 lb (125.646 kg), SpO2 95 %.  General: No apparent distress alert and oriented x3 mood and affect normal, dressed appropriately.  HEENT: Pupils equal, extraocular movements intact  Respiratory: Patient's speak in full sentences and does not appear short of breath  Cardiovascular: No lower extremity edema, non tender, no erythema  Skin: Warm dry intact with no signs of infection or rash on extremities or on axial skeleton.  Abdomen: Soft nontender  Neuro: Cranial nerves II through XII are intact, neurovascularly intact in all extremities with 2+ DTRs and 2+ pulses.  Lymph: No lymphadenopathy of posterior or anterior cervical chain or axillae bilaterally.  Gait normal with good balance and coordination.  MSK:  Non tender with full range of motion and good stability and symmetric strength and tone of shoulders, elbows, wrist, hip, knee and ankles bilaterally.  Back Exam:  Inspection: Unremarkable  Motion: Flexion 45 deg, Extension 45 deg, Side Bending to 45 deg bilaterally,  Rotation to 45 deg bilaterally  SLR laying: Negative  XSLR laying: Negative  Palpable tenderness: Mild discomfort over the paraspinal musculature of the thoracic and cervical spine.[name] FABER: negative. Sensory change: Gross sensation intact to all lumbar and sacral dermatomes.  Reflexes: 2+ at both patellar tendons, 2+ at achilles tendons, Babinski's  downgoing.  Strength at foot  Plantar-flexion: 5/5 Dorsi-flexion: 5/5 Eversion: 5/5 Inversion: 5/5  Leg strength  Quad: 5/5 Hamstring: 5/5 Hip flexor: 5/5 Hip abductors: 5/5  Gait unremarkable.  Procedure note 67893; 15 minutes spent for Therapeutic exercises as stated in above notes.  This included exercises focusing on stretching, strengthening, with significant focus on eccentric aspects.  Low back exercises that included:  Pelvic tilt/bracing instruction to focus on control of the pelvic girdle and lower abdominal muscles  Glute strengthening exercises, focusing on proper firing of the glutes without engaging the low back muscles Proper stretching techniques for maximum relief for the hamstrings, hip flexors, low back and some rotation where tolerated.  Proper technique shown and discussed handout in great detail with ATC.  All questions were discussed and answered.      Impression and Recommendations:     This case required medical decision making of moderate complexity.

## 2014-12-21 NOTE — Progress Notes (Signed)
Pre visit review using our clinic review tool, if applicable. No additional management support is needed unless otherwise documented below in the visit note. 

## 2014-12-21 NOTE — Assessment & Plan Note (Signed)
Patient's back pain is likely muscular in nature. We will get x-rays to rule out any bony abnormality with patient having some neurologic component earlier on. Patient is no longer having any difficulty with his arms or the numbness that was stated when he was at urgent care. We are going to try a new anti-inflammatory, home exercises, and icing protocol. Patient has been on prednisone for quite some times we do need to rule out any compression fracture. Patient has been on limited work and I want him to continue this for another 2-3 weeks. Patient given home exercises for scapular strengthening of topical be more beneficial. She has muscle relaxers if necessary. Patient and will follow-up with me again in 2-3 weeks for further evaluation and treatment.

## 2014-12-29 ENCOUNTER — Ambulatory Visit: Payer: 59 | Admitting: Family Medicine

## 2015-01-11 ENCOUNTER — Ambulatory Visit (INDEPENDENT_AMBULATORY_CARE_PROVIDER_SITE_OTHER): Payer: Worker's Compensation | Admitting: Family Medicine

## 2015-01-11 ENCOUNTER — Encounter: Payer: Self-pay | Admitting: Family Medicine

## 2015-01-11 ENCOUNTER — Encounter: Payer: Self-pay | Admitting: *Deleted

## 2015-01-11 VITALS — BP 138/82 | HR 91 | Ht 70.0 in | Wt 275.0 lb

## 2015-01-11 DIAGNOSIS — M549 Dorsalgia, unspecified: Secondary | ICD-10-CM

## 2015-01-11 DIAGNOSIS — M779 Enthesopathy, unspecified: Secondary | ICD-10-CM

## 2015-01-11 DIAGNOSIS — M778 Other enthesopathies, not elsewhere classified: Secondary | ICD-10-CM | POA: Diagnosis not present

## 2015-01-11 MED ORDER — DICLOFENAC SODIUM 2 % TD SOLN: TRANSDERMAL | Status: DC

## 2015-01-11 NOTE — Patient Instructions (Signed)
Good to see you Alternate the new and old exercises 3 times aweek Ice is your friend still pennsaid pinkie amount topically 2 times daily as needed.  Light duty for 2 weeks See me again in 2 weeks and we will get you back at it

## 2015-01-11 NOTE — Assessment & Plan Note (Signed)
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 We discussed icing regimen and patient given topical anti-inflammatory's. Patient does not make any significant improvement I would consider nitroglycerin patches and potentially physical therapy. Patient will be on light duty for another 2 weeks and follow-up in 2 weeks for further evaluation and treatment.

## 2015-01-11 NOTE — Progress Notes (Signed)
Pre visit review using our clinic review tool, if applicable. No additional management support is needed unless otherwise documented below in the visit note. 

## 2015-01-11 NOTE — Progress Notes (Signed)
[name] [address], [place] [address] Phone: [phone] Subjective:    I'm seeing this patient by the request  of:  Georgina Snell MD   CC: Thoracic back pain  [name] [name] is a 49 y.o. male coming in with complaint of mid back pain. Patient was seen previously and did have more of a scapular dysfunction. Patient was given home exercises and states that this is significantly helped his back and neck. Patient did have thoracic as well as cervical x-rays which were only showed mild osteophytic changes. Patient states overall he seems to be doing relatively well. His been on light duty at work and thinks that this is been beneficial as well. Patient Costella Hatcher that when he does certain activities such as pushing unfortunately he has pain on his elbows posteriorly bilaterally. Patient discusses it as a dull throbbing aching sensation. He thought that this was secondary to his neck and back but does not seem to be improving like his neck and back has. Patient states that this can be a very sharp pain. Denies any numbness or tingling or any weakness. Continues to be active.     Past Medical History  Diagnosis Date  . ALLERGIC RHINITIS   . Asthma, mild intermittent   . Allergic conjunctivitis   . Kidney stone    Past Surgical History  Procedure Laterality Date  . Wisdom tooth extraction    . Lithotripsy     History  Substance Use Topics  . Smoking status: Never Smoker   . Smokeless tobacco: Never Used  . Alcohol Use: Not on file   Family History  Problem Relation Age of Onset  . Asthma Father   . Other Father     bronchitis   Allergies  Allergen Reactions  . Fluticasone-Salmeterol     REACTION: jaw joint pain     Past medical history, social, surgical and family history all reviewed in electronic medical record.   Review of Systems: No headache, visual changes, nausea, vomiting, diarrhea, constipation, dizziness, abdominal  pain, skin rash, fevers, chills, night sweats, weight loss, swollen lymph nodes, body aches, joint swelling, muscle aches, chest pain, shortness of breath, mood changes.   Objective Blood pressure 138/82, pulse 91, height 5\' 10"  (1.778 m), weight 275 lb (124.739 kg), SpO2 96 %.  General: No apparent distress alert and oriented x3 mood and affect normal, dressed appropriately.  HEENT: Pupils equal, extraocular movements intact  Respiratory: Patient's speak in full sentences and does not appear short of breath  Cardiovascular: No lower extremity edema, non tender, no erythema  Skin: Warm dry intact with no signs of infection or rash on extremities or on axial skeleton.  Abdomen: Soft nontender  Neuro: Cranial nerves II through XII are intact, neurovascularly intact in all extremities with 2+ DTRs and 2+ pulses.  Lymph: No lymphadenopathy of posterior or anterior cervical chain or axillae bilaterally.  Gait normal with good balance and coordination.  MSK:  Non tender with full range of motion and good stability and symmetric strength and tone of shoulders, wrist, hip, knee and ankles bilaterally.  Back Exam:  Inspection: Unremarkable  Motion: Flexion 45 deg, Extension 45 deg, Side Bending to 45 deg bilaterally,  Rotation to 45 deg bilaterally  SLR laying: Negative  XSLR laying: Negative  Palpable tenderness: Nontender on exam today FABER: negative. Sensory change: Gross sensation intact to all lumbar and sacral dermatomes.  Reflexes: 2+ at both patellar tendons, 2+ at achilles  tendons, Babinski's downgoing.  Strength at foot  Plantar-flexion: 5/5 Dorsi-flexion: 5/5 Eversion: 5/5 Inversion: 5/5  Leg strength  Quad: 5/5 Hamstring: 5/5 Hip flexor: 5/5 Hip abductors: 5/5  Gait unremarkable.  Elbow: Bilateral Unremarkable to inspection. Range of motion full pronation, supination, flexion, extension. Strength is full to all of the above directions Stable to varus, valgus stress. Negative  moving valgus stress test. Tender over the tricep tendon bilaterally pain worse with forced extension. Ulnar nerve does not sublux. Negative cubital tunnel Tinel's.      Impression and Recommendations:     This case required medical decision making of moderate complexity.

## 2015-01-11 NOTE — Assessment & Plan Note (Signed)
Improved significantly with conservative therapy at this time. Encourage patient to continue the exercises 3 times a week for another 6 weeks as well as some of the natural supplementations. Patient will come back if any exacerbation occurs. Patient's x-rays were unremarkable. Patient has any worsening symptoms advance imaging may be warranted.

## 2015-01-12 ENCOUNTER — Telehealth: Payer: Self-pay | Admitting: Internal Medicine

## 2015-01-12 MED ORDER — AMOXICILLIN-POT CLAVULANATE 875-125 MG PO TABS: 1.0000 | ORAL_TABLET | Freq: Two times a day (BID) | ORAL | Status: DC

## 2015-01-12 NOTE — Telephone Encounter (Signed)
Pt aware.

## 2015-01-12 NOTE — Telephone Encounter (Signed)
Called spoke with pt. C/o URI x 3 days. He is requesting augmentin. C/o prod cough ( dark yellow phlem), wheezing at night, chest cong.  Please send into [facility]. Please advise CDY thanks  Allergies  Allergen Reactions  . Fluticasone-Salmeterol     REACTION: jaw joint pain     Current Outpatient Prescriptions on File Prior to Visit  Medication Sig Dispense Refill  . albuterol (VENTOLIN HFA) 108 (90 BASE) MCG/ACT inhaler Inhale 2 puffs into the lungs every 6 (six) hours as needed for wheezing or shortness of breath. 3 Inhaler 3  . amoxicillin-clavulanate (AUGMENTIN) 875-125 MG per tablet Take 1 tablet by mouth 2 (two) times daily. 14 tablet 0  . azithromycin (ZITHROMAX) 250 MG tablet 2 today then one daily for infection as directed 90 tablet 0  . budesonide-formoterol (SYMBICORT) 160-4.5 MCG/ACT inhaler Inhale 2 puffs into the lungs 2 (two) times daily. Rinse mouth 3 Inhaler 3  . clorazepate (TRANXENE) 7.5 MG tablet TAKE 1 TABLET BY MOUTH TWICE A DAY AS NEEDED 180 tablet 3  . cyclobenzaprine (FLEXERIL) 10 MG tablet Take 1 tablet (10 mg total) by mouth at bedtime as needed for muscle spasms. 20 tablet 0  . Diclofenac Sodium 2 % SOLN Apply 1 pump twice daily 112 g 3  . doxycycline (VIBRAMYCIN) 100 MG capsule Take 1 capsule (100 mg total) by mouth 2 (two) times daily. 20 capsule 0  . fluconazole (DIFLUCAN) 100 MG tablet Take 1 tablet (100 mg total) by mouth daily. 7 tablet 2  . fluticasone (FLONASE) 50 MCG/ACT nasal spray Place 2 sprays into both nostrils daily. 48 g 3  . ipratropium-albuterol (DUONEB) 0.5-2.5 (3) MG/3ML SOLN 1 neb 4 times daily as needed 1080 mL 3  . montelukast (SINGULAIR) 10 MG tablet 1 daily 90 tablet 3  . naproxen (NAPROSYN) 500 MG tablet Take 1 tablet (500 mg total) by mouth 2 (two) times daily. 30 tablet 0  . promethazine-codeine (PHENERGAN WITH CODEINE) 6.25-10 MG/5ML syrup Take 5 mLs by mouth every 6 (six) hours as needed for cough. 200 mL 0  . sodium chloride  (OCEAN) 0.65 % nasal spray 1 spray by Nasal route as needed.       No current facility-administered medications on file prior to visit.

## 2015-01-12 NOTE — Telephone Encounter (Signed)
Ok augmentin 875, # 20, 1 twice daily, refill x 1

## 2015-01-12 NOTE — Telephone Encounter (Signed)
lmomtcb x1 RX sent in

## 2015-01-16 ENCOUNTER — Telehealth: Payer: Self-pay | Admitting: Internal Medicine

## 2015-01-16 MED ORDER — IPRATROPIUM-ALBUTEROL 0.5-2.5 (3) MG/3ML IN SOLN: RESPIRATORY_TRACT | Status: DC

## 2015-01-16 MED ORDER — CLORAZEPATE DIPOTASSIUM 7.5 MG PO TABS: ORAL_TABLET | ORAL | Status: DC

## 2015-01-16 NOTE — Telephone Encounter (Signed)
RXs sent in 

## 2015-01-16 NOTE — Telephone Encounter (Signed)
LMTCB x 1 Need to verify that patient wants Rxs to go [facility].

## 2015-01-16 NOTE — Telephone Encounter (Addendum)
Refill Ipratropium/Sol Albuterol Neb Refill Clorazepate Requesting 90 days supply Last OV: 07/27/2014 Next OV: 01/26/2015  Ok to refill for 90 days supply to [facility]?  Allergies  Allergen Reactions  . Fluticasone-Salmeterol     REACTION: jaw joint pain   Current Outpatient Prescriptions on File Prior to Visit  Medication Sig Dispense Refill  . albuterol (VENTOLIN HFA) 108 (90 BASE) MCG/ACT inhaler Inhale 2 puffs into the lungs every 6 (six) hours as needed for wheezing or shortness of breath. 3 Inhaler 3  . amoxicillin-clavulanate (AUGMENTIN) 875-125 MG per tablet Take 1 tablet by mouth 2 (two) times daily. 20 tablet 1  . azithromycin (ZITHROMAX) 250 MG tablet 2 today then one daily for infection as directed 90 tablet 0  . budesonide-formoterol (SYMBICORT) 160-4.5 MCG/ACT inhaler Inhale 2 puffs into the lungs 2 (two) times daily. Rinse mouth 3 Inhaler 3  . clorazepate (TRANXENE) 7.5 MG tablet TAKE 1 TABLET BY MOUTH TWICE A DAY AS NEEDED 180 tablet 3  . cyclobenzaprine (FLEXERIL) 10 MG tablet Take 1 tablet (10 mg total) by mouth at bedtime as needed for muscle spasms. 20 tablet 0  . Diclofenac Sodium 2 % SOLN Apply 1 pump twice daily 112 g 3  . doxycycline (VIBRAMYCIN) 100 MG capsule Take 1 capsule (100 mg total) by mouth 2 (two) times daily. 20 capsule 0  . fluconazole (DIFLUCAN) 100 MG tablet Take 1 tablet (100 mg total) by mouth daily. 7 tablet 2  . fluticasone (FLONASE) 50 MCG/ACT nasal spray Place 2 sprays into both nostrils daily. 48 g 3  . ipratropium-albuterol (DUONEB) 0.5-2.5 (3) MG/3ML SOLN 1 neb 4 times daily as needed 1080 mL 3  . montelukast (SINGULAIR) 10 MG tablet 1 daily 90 tablet 3  . naproxen (NAPROSYN) 500 MG tablet Take 1 tablet (500 mg total) by mouth 2 (two) times daily. 30 tablet 0  . promethazine-codeine (PHENERGAN WITH CODEINE) 6.25-10 MG/5ML syrup Take 5 mLs by mouth every 6 (six) hours as needed for cough. 200 mL 0  . sodium chloride (OCEAN) 0.65 % nasal spray 1  spray by Nasal route as needed.       No current facility-administered medications on file prior to visit.

## 2015-01-16 NOTE — Telephone Encounter (Signed)
Ok to refill these for 90 days

## 2015-01-26 ENCOUNTER — Encounter: Payer: Self-pay | Admitting: Internal Medicine

## 2015-01-26 ENCOUNTER — Ambulatory Visit (INDEPENDENT_AMBULATORY_CARE_PROVIDER_SITE_OTHER): Payer: 59 | Admitting: Internal Medicine

## 2015-01-26 VITALS — BP 122/84 | HR 86 | Ht 70.0 in | Wt 276.0 lb

## 2015-01-26 DIAGNOSIS — G4733 Obstructive sleep apnea (adult) (pediatric): Secondary | ICD-10-CM | POA: Diagnosis not present

## 2015-01-26 DIAGNOSIS — J4540 Moderate persistent asthma, uncomplicated: Secondary | ICD-10-CM | POA: Diagnosis not present

## 2015-01-26 DIAGNOSIS — J320 Chronic maxillary sinusitis: Secondary | ICD-10-CM

## 2015-01-26 DIAGNOSIS — B370 Candidal stomatitis: Secondary | ICD-10-CM

## 2015-01-26 DIAGNOSIS — B3781 Candidal esophagitis: Secondary | ICD-10-CM | POA: Diagnosis not present

## 2015-01-26 MED ORDER — AZITHROMYCIN 250 MG PO TABS: ORAL_TABLET | ORAL | Status: DC

## 2015-01-26 MED ORDER — BUDESONIDE-FORMOTEROL FUMARATE 160-4.5 MCG/ACT IN AERO: 2.0000 | INHALATION_SPRAY | Freq: Two times a day (BID) | RESPIRATORY_TRACT | Status: DC

## 2015-01-26 MED ORDER — FLUCONAZOLE 100 MG PO TABS: 100.0000 mg | ORAL_TABLET | Freq: Every day | ORAL | Status: DC

## 2015-01-26 MED ORDER — ALBUTEROL SULFATE HFA 108 (90 BASE) MCG/ACT IN AERS: 2.0000 | INHALATION_SPRAY | Freq: Four times a day (QID) | RESPIRATORY_TRACT | Status: DC | PRN

## 2015-01-26 MED ORDER — FLUTICASONE PROPIONATE 50 MCG/ACT NA SUSP: 2.0000 | Freq: Every day | NASAL | Status: DC

## 2015-01-26 MED ORDER — UMECLIDINIUM-VILANTEROL 62.5-25 MCG/INH IN AEPB: 1.0000 | INHALATION_SPRAY | Freq: Every day | RESPIRATORY_TRACT | Status: DC

## 2015-01-26 NOTE — Patient Instructions (Addendum)
Sample Anoro Ellipta inhaler    1 puff, once daily   Try this sample instead of Symbicort. When it runs out, go back to your Symbicort.  Script sent refilling symibcort  Scripts printed for fluconazole, azithromycin, ventolin, fluticasone  Order- schedule unattended home sleep study   Dx OSA

## 2015-01-26 NOTE — Progress Notes (Signed)
Patient ID: [name] [name], male    DOB: 06-12-1966, 49 y.o.   MRN: [id number]  HPI 82 yoM followed for hx of asthma, rhinitis and chronic sinusitis. Last here December 26, 2009. Since then he had extensive sinus surgery and polypectomy by Dr Erik Obey 07/26/70, which did help nasal congestion. They noted the pollen issues this Spring. Slow prednisone taper ended 2 weeks ago. Off that he again got congested in chest, wheezing, short of breath interfering with sleep. We called short prednisone taper which has helped, but not enough. He continues Symbicort, Flonase, Singulair. Dr Erik Obey treated for thrush. Denies diabetes. He notes weight gain on prednisone, Skin test positive and on allergy vaccine in the past. 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  12/18/10- Asthma, rhinitis, chronic sinusitis s/p sinus surgery/ polypectomy.  We restarted prednisone 5 mg and he is using 1-3 daily. Feels "no better no worse". Duleara 200  is the same as Symbicort 160. Nasonex works better than Fluticasone. He and his wife discussed Xolair and decided not to risk it for fear of side effects. He continues to work in [employer], but says lung disease is not thought to be associated with lung disease. Nasal airway now seems great, 3 months after surgery. Still wakes once/ night to cough up white mucus. Can't breathe w/o prednisone. Feels better outdoors.   03/18/11-  Asthma, rhinitis, chronic sinusitis s/p sinus surgery/ polypectomy.  Cough remains productive, with much postnasal drip. Continue neti pot. Denies aspiration/ choke while eating. Some off and on wheeze.  Insurance wouldn't pay for [employer]. He associated back pain with Dulera, relieved by returning to Symbicort Uses nebulizer 3 x most days. Denies glaucoma or prostatism> Discussed Spiriva as a drying option to try. Since nasal surgery by Dr Erik Obey he drains more. Nasonex better than  fluticasone. Not using an antihistamine.  Prednisone 2-3 x 5 mg daily; tries to skip it but never can. We talked again about trying Xolair- he says his wife was frightened by reported side effects. 53 yoM followed for hx of asthma, rhinitis and chronic sinusitis.  06/19/11- 89 yoM never smoker followed for hx of asthma, rhinitis and chronic sinusitis. After a very difficult half year, he says he finally feels that he is at least more stable. There is extra stress from dealing with his sick and elderly father. Tranxene has been a big help with the stress. Since he is doing less "stress eating" he has lost 10 pounds. He likes Spiriva and uses his nebulizer twice a day. We have continued him taking daily maintenance prednisone between 10 and 15 mg daily. This has caused thrush. Since the sinus surgery, he still has no sinus infection but continues using his sinus rinse. Cough continues to be productive with scant yellow. Often he can't get it to come out and will wheeze. He denies fever, sweat, sore throat, swollen glands, blood.  10/17/11- 52 yoM never smoker followed for hx of asthma, rhinitis and chronic sinusitis. Acute bronchitis exacerbation slowly resolving over 11 weeks. Hoarseness is slowly getting better. Still doing saline nasal rinse every day. Cough is always productive of mucus which is light yellow to green. Now off of antibiotics. Still taking prednisone 15 mg daily-can't get lower. Continues Symbicort.  01/21/12- 62 yoM never smoker followed for hx of asthma, rhinitis and chronic sinusitis. Can tell a big difference with taking Azithromycin once daily; not using rescue inhaler as  often now. Son has graduated from high school, removing a significant source of stress in the family. Hoarseness comes and goes, because of which he can't sing. We discussed thrush, reflux and postnasal drainage as possible contributors. He cannot get lower than 15 mg prednisone daily. Much less use of rescue  inhaler as noted. He decided side effect risk was too great to try Xolair shots. He does use AeroChamber with Symbicort. Has cut down some on Spiriva but uses nebulizer twice a day.  07/28/12- 20 yoM never smoker followed for hx of asthma, rhinitis and chronic sinusitis. FOLLOWS FOR: sick in November and had to get RX; sickness keeps going around in family; slight wheezing and SOB since. A viral syndrome respiratory infection was passing around in his family in November. We had called in Augmentin which helped. He was well until Christmas when he had another flare. Persistent cough with scant yellow sputum, no fever or blood. We had tried leaving him on maintenance Zithromax. Using 1 puff Symbicort one time daily. Continues tranxene. No longer having sinus infections since the sinus surgery but continues Flonase and uses a mask for yard work. Continues prednisone 15 mg daily; 10 mg was not enough to keep him stable.  01/27/13- 56 yoM never smoker followed for hx of asthma, rhinitis and chronic sinusitis. FOLLOWS FOR: pt reports he has had some "rough days" throughout the winter, but since warmer weather breathing is doing better; feels like he is at a baseline Weather limiting wants to work outside especially in the winter. Wears a mask to mow. Tried Cefdinir- no help. Likes azithromycin used in intervals when needed. We discussed anti-inflammatory effect. Daily nebulizer 2 or 3 times/DuoNeb. Continues other meds as discussed. CXR 07/30/12 IMPRESSION:  No edema or consolidation.  Original Report Authenticated By: Lowella Grip, M.D.  08/03/13-47 yoM never smoker followed for hx of asthma, rhinitis and chronic sinusitis.  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 Unusual exertion in cold air triggered asthma. Now on work days he takes 15 mg prednisone, on days off he takes 10 mg. Using nebulizer machine  about 3 times daily.  11/02/13- 47 yoM never smoker followed for hx of asthma, rhinitis and chronic sinusitis. FOLLOWS FOR: Pt states after using Breo he got sick with URI(thinks he caught from other people-?medication causing some side effects). Was given 2 abx since last visit to help with this. Since completing the meds he feels good and wants to know if he should go back on Breo. Has not used Breo or Spriva since Jan 2015(Muscle pains went away since stopping Spriva). Continues to use Symbicort("can't live without it").  Using nebulizer at least once daily. Feels controlled now. Minor sneezing from pollen. Continues maintenance prednisone 15 mg daily and maintenance Zithromax for chronic bronchitis depression. Uses mask to mow or do yard work. 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  02/03/14- 47 yoM never smoker followed for hx of chronic obstructive asthma, rhinitis and chronic sinusitis. FOLLOWS FOR: patient states he has not felt well since hot weather has set in.  Pt stopped Zithromax and feels like he getting sick again. Did well after last flare in April. Now blames hot weather on easy weakness and dyspnea with exertion outdoors. Not much cough or wheeze. After 2 months off of maintenance Zithromax he just restarted. Cough is chronic and productive of white to yellow nonbloody sputum. Maintenance  prednisone 15 mg daily. Using his nebulizer machine 3-4 times daily. Office Spirometry 02/03/2014-severe obstructive airways disease. FVC 3.13/62%, FEV1 2.01/50%, FEV1/FVC 0.64, FEF 25-75% 0.87/22%.  07/27/14- 76 yoM never smoker followed for hx of chronic obstructive asthma, rhinitis and chronic sinusitis. Maintenance prednisone Stopped maintenance zithromax- use for acute infections FOLLOWS FOR: Pt reports having cough in November-used Zithromax to treat upper respiratory issues. Pt states that he is still having cough(decreased). Pt reports using nebulizer and  albuterol hfa more frequent.  Exacerbation of bronchitis with increased cough. Not using Spiriva and has been using Symbicort only once daily because of cost. Medications reviewed Rhinitis much improved and he has sense of smell back. CXR 02/03/14 IMPRESSION: Negative exam. Electronically Signed  By: Inge Rise M.D.  On: 02/03/2014 14:00  01/26/15- 26 yoM never smoker followed for hx of chronic obstructive asthma, rhinitis and chronic sinusitis. Maintenance prednisone 15 mg daily Follows for: c/o prod cough with dark brown. Treated for torn tendons and muscles by Dr. Ortencia Kick.  Some cough and wheeze most days. Discussed use of Symbicort. Rescue inhaler at least once on most days. Wife concerned he may have obstructive sleep apnea because of witnessed snore CXR 12/14/14 IMPRESSION: No active cardiopulmonary disease. Electronically Signed  By: Lahoma Crocker M.D.  On: 12/14/2014 11:47  Review of Systems-See HPI Constitutional:    No- night sweats ,fevers, chills, fatigue, lassitude. HEENT:   No - headaches,  Difficulty swallowing, Tooth/dental problems, Sore throat,                No sneezing, itching, ear ache, CV:  No chest pain,  Orthopnea, PND, swelling in lower extremities, anasarca, dizziness, palpitations GI  No heartburn, indigestion, abdominal pain, nausea, vomiting,  Resp:.See HPI   +Some productive cough   No coughing up of blood.  No-change in color of mucus,                + wheeze Skin: no rash or lesions. GU: . MS:  No joint pain or swelling.  [name] Psych:   change in mood or affect.  depression and  anxiety.  No memory loss.  Today Objective:   Physical Exam General- Alert, Oriented, Affect-appropriate, Distress- none acute, overweight Skin- rash-none, lesions- none, excoriation- none Lymphadenopathy- none Head- atraumatic            Eyes- Gross vision intact, PERRLA, conjunctivae clear secretions            Ears- Hearing, canals normal            Nose-  Clear, no-Septal dev, mucus, polyps, erosion, perforation             Throat- Mallampati  III- IV , mucosa clear , drainage- none, tonsils- atrophic. , Neck- flexible , trachea midline, no stridor , thyroid nl, carotid no bruit Chest - symmetrical excursion , unlabored           Heart/CV- RRR , no murmur , no gallop  , no rub, nl s1 s2                           - JVD- none , edema- none, stasis changes- none, varices- none           Lung-  Wheeze-none, + few crackles right lung, unlabored,   Cough-none , dullness-none, rub- none           Chest wall-  Abd-  Br/ Gen/ Rectal- Not done, not indicated  Extrem- cyanosis- none, clubbing, none, atrophy- none, strength- nl Neuro- grossly intact to observation

## 2015-01-28 DIAGNOSIS — G4733 Obstructive sleep apnea (adult) (pediatric): Secondary | ICD-10-CM | POA: Insufficient documentation

## 2015-01-28 NOTE — Assessment & Plan Note (Signed)
Moderate persistent, steroid dependent Plan-discussed ways to try to reduce medications a little if tolerated. Standby prescription to hold for Zithromax.

## 2015-01-28 NOTE — Assessment & Plan Note (Signed)
Plan-keep fluconazole available

## 2015-01-28 NOTE — Assessment & Plan Note (Signed)
History and exam are consistent with diagnosis. Discussed basic sleep hygiene, driving responsibility, effect of weight. Plan-schedule sleep study

## 2015-01-28 NOTE — Assessment & Plan Note (Signed)
Symptomatically much improved. Discussed use of Flonase

## 2015-02-01 ENCOUNTER — Ambulatory Visit (INDEPENDENT_AMBULATORY_CARE_PROVIDER_SITE_OTHER): Payer: Worker's Compensation | Admitting: Family Medicine

## 2015-02-01 ENCOUNTER — Encounter: Payer: Self-pay | Admitting: Family Medicine

## 2015-02-01 VITALS — BP 114/76 | HR 84 | Ht 70.0 in | Wt 276.0 lb

## 2015-02-01 DIAGNOSIS — M778 Other enthesopathies, not elsewhere classified: Secondary | ICD-10-CM

## 2015-02-01 DIAGNOSIS — M779 Enthesopathy, unspecified: Principal | ICD-10-CM

## 2015-02-01 MED ORDER — NITROGLYCERIN 0.2 MG/HR TD PT24: MEDICATED_PATCH | TRANSDERMAL | Status: DC

## 2015-02-01 NOTE — Patient Instructions (Signed)
Good to see you Ice can help when needed Continue the exercises Patellar strap above the elbow.  Nitroglycerin Protocol   Apply 1/4 nitroglycerin patch to affected area daily.  Change position of patch within the affected area every 24 hours.  You may experience a headache during the first 1-2 weeks of using the patch, these should subside.  If you experience headaches after beginning nitroglycerin patch treatment, you may take your preferred over the counter pain reliever.  Another side effect of the nitroglycerin patch is skin irritation or rash related to patch adhesive.  Please notify our office if you develop more severe headaches or rash, and stop the patch.  Tendon healing with nitroglycerin patch may require 12 to 24 weeks depending on the extent of injury.  Men should not use if taking Viagra, Cialis, or Levitra.   Do not use if you have migraines or rosacea.   See me again in 3 weeks.

## 2015-02-01 NOTE — Progress Notes (Signed)
[name] [place], [place] [address] Phone: [phone] Subjective:    I'm seeing this patient by the request  of:  Georgina Snell MD   CC: Thoracic back pain  [id number] SHIZUO BISKUP is a 49 y.o. male coming in with complaint of mid back pain. Patient was seen previously and did have more of a scapular dysfunction. Patient states that this is nearly completely gone at this time.  patient was also diagnosed with a tricep tendinitis. Patient was put on some limitations at work given some home exercises and some topical anti-inflammatory's. Patient also takes oral anti-blood was fairly regularly. Patient stateshis left side and seems to be 90% better but is right-sided previously proximately 40% better. Patient states that he still has the pain mostly on the posterior medial aspect of the elbow. Has been doing the icing as well as the home exercises. Patient states some mild improvement. Patient has been on light duty at work. No new symptoms such as numbness.    Past Medical History  Diagnosis Date  . ALLERGIC RHINITIS   . Asthma, mild intermittent   . Allergic conjunctivitis   . Kidney stone    Past Surgical History  Procedure Laterality Date  . Wisdom tooth extraction    . Lithotripsy     History  Substance Use Topics  . Smoking status: Never Smoker   . Smokeless tobacco: Never Used  . Alcohol Use: Not on file   Family History  Problem Relation Age of Onset  . Asthma Father   . Other Father     bronchitis   Allergies  Allergen Reactions  . Fluticasone-Salmeterol     REACTION: jaw joint pain     Past medical history, social, surgical and family history all reviewed in electronic medical record.   Review of Systems: No headache, visual changes, nausea, vomiting, diarrhea, constipation, dizziness, abdominal pain, skin rash, fevers, chills, night sweats, weight loss, swollen lymph nodes, body aches, joint swelling, muscle  aches, chest pain, shortness of breath, mood changes.   Objective Blood pressure 114/76, pulse 84, height 5\' 10"  (1.778 m), weight 276 lb (125.193 kg), SpO2 94 %.  General: No apparent distress alert and oriented x3 mood and affect normal, dressed appropriately.  HEENT: Pupils equal, extraocular movements intact  Respiratory: Patient's speak in full sentences and does not appear short of breath  Cardiovascular: No lower extremity edema, non tender, no erythema  Skin: Warm dry intact with no signs of infection or rash on extremities or on axial skeleton.  Abdomen: Soft nontender  Neuro: Cranial nerves II through XII are intact, neurovascularly intact in all extremities with 2+ DTRs and 2+ pulses.  Lymph: No lymphadenopathy of posterior or anterior cervical chain or axillae bilaterally.  Gait normal with good balance and coordination.  MSK:  Non tender with full range of motion and good stability and symmetric strength and tone of shoulders, wrist, hip, knee and ankles bilaterally.  Back Exam:  Inspection: Unremarkable  Motion: Flexion 45 deg, Extension 45 deg, Side Bending to 45 deg bilaterally,  Rotation to 45 deg bilaterally  SLR laying: Negative  XSLR laying: Negative  Palpable tenderness: Nontender on exam today FABER: negative. Sensory change: Gross sensation intact to all lumbar and sacral dermatomes.  Reflexes: 2+ at both patellar tendons, 2+ at achilles tendons, Babinski's downgoing.  Strength at foot  Plantar-flexion: 5/5 Dorsi-flexion: 5/5 Eversion: 5/5 Inversion: 5/5  Leg strength  Quad: 5/5 Hamstring: 5/5 Hip flexor:  5/5 Hip abductors: 5/5  Gait unremarkable.  Elbow: Bilateral Unremarkable to inspection. Range of motion full pronation, supination, flexion, extension. Strength is full to all of the above directions Stable to varus, valgus stress. Negative moving valgus stress test. Tender over the tricep tendon bilaterally pain worse with forced extension especially on  the right side Ulnar nerve does not sublux. Negative cubital tunnel Tinel's.      Impression and Recommendations:     This case required medical decision making of moderate complexity.

## 2015-02-01 NOTE — Assessment & Plan Note (Signed)
Discussed with patient at great length. Patient will start on nitroglycerin patches. We discussed the possibility of formal physical therapy which patient declined. Patient will remain on light duty for another 3 weeks. Patient will continue topical anti-inflammatory says needed and continue the home exercises and icing protocol. Patient was warned of potential side effects and the nitroglycerin. Patient will come back and see me again in 3 weeks for further evaluation.

## 2015-02-01 NOTE — Progress Notes (Signed)
Pre visit review using our clinic review tool, if applicable. No additional management support is needed unless otherwise documented below in the visit note. 

## 2015-02-09 DIAGNOSIS — G4730 Sleep apnea, unspecified: Secondary | ICD-10-CM | POA: Diagnosis not present

## 2015-02-10 DIAGNOSIS — G4730 Sleep apnea, unspecified: Secondary | ICD-10-CM | POA: Diagnosis not present

## 2015-02-13 ENCOUNTER — Encounter: Payer: Self-pay | Admitting: Internal Medicine

## 2015-02-13 ENCOUNTER — Other Ambulatory Visit: Payer: Self-pay | Admitting: *Deleted

## 2015-02-13 DIAGNOSIS — G4733 Obstructive sleep apnea (adult) (pediatric): Secondary | ICD-10-CM

## 2015-02-22 ENCOUNTER — Telehealth: Payer: Self-pay | Admitting: Internal Medicine

## 2015-02-22 ENCOUNTER — Ambulatory Visit (INDEPENDENT_AMBULATORY_CARE_PROVIDER_SITE_OTHER): Payer: Worker's Compensation | Admitting: Family Medicine

## 2015-02-22 ENCOUNTER — Encounter: Payer: Self-pay | Admitting: Family Medicine

## 2015-02-22 ENCOUNTER — Encounter: Payer: Self-pay | Admitting: *Deleted

## 2015-02-22 VITALS — BP 128/78 | HR 105 | Ht 70.0 in | Wt 278.0 lb

## 2015-02-22 DIAGNOSIS — M7711 Lateral epicondylitis, right elbow: Secondary | ICD-10-CM

## 2015-02-22 DIAGNOSIS — M779 Enthesopathy, unspecified: Principal | ICD-10-CM

## 2015-02-22 DIAGNOSIS — G4733 Obstructive sleep apnea (adult) (pediatric): Secondary | ICD-10-CM

## 2015-02-22 DIAGNOSIS — M778 Other enthesopathies, not elsewhere classified: Secondary | ICD-10-CM | POA: Diagnosis not present

## 2015-02-22 NOTE — Assessment & Plan Note (Signed)
Patient is improving at this time. Encourage him to continue the home exercises as well as the nitroglycerin. I think this is making significant improvement. Patient will continue with the bracing and patient given a wrist brace for more of the lateral epicondylar region. Patient will be put on light duty for 1 more round of 3 weeks. If not better at that time imaging is going to be warranted as well as potential injection.  Spent  25 minutes with patient face-to-face and had greater than 50% of counseling including as described above in assessment and plan.

## 2015-02-22 NOTE — Progress Notes (Signed)
[name] [address] [place], [place] [address] Phone: [phone] Subjective:    I'm seeing this patient by the request  of:  Georgina Snell MD   CC: Tricep tendinitis  [id number] [name] [name] is a 49 y.o. male coming in with complaint of mid back pain. Patient was seen previously and did have more of a scapular dysfunction. Patient states that this is nearly completely gone at this time.  patient was also diagnosed with a tricep tendinitis. Patient was put on some limitations at work given some home exercises and some topical anti-inflammatory's.  On nitroglycerin and doing well. She states that the left side is completely healed at this time. Still having some mild right-sided pain. Patient states it's more of the lateral epicondylitis that he was diagnosed with last time as well. Patient states it is very mild but still hurts him when he does any type of heavy lifting. Patient is continuing to be on light duty at work. Denies any new symptoms and states that he is approximately 60-70% better on the right side.  Past Medical History  Diagnosis Date  . ALLERGIC RHINITIS   . Asthma, mild intermittent   . Allergic conjunctivitis   . Kidney stone    Past Surgical History  Procedure Laterality Date  . Wisdom tooth extraction    . Lithotripsy     History  Substance Use Topics  . Smoking status: Never Smoker   . Smokeless tobacco: Never Used  . Alcohol Use: Not on file   Family History  Problem Relation Age of Onset  . Asthma Father   . Other Father     bronchitis   Allergies  Allergen Reactions  . Fluticasone-Salmeterol     REACTION: jaw joint pain     Past medical history, social, surgical and family history all reviewed in electronic medical record.   Review of Systems: No headache, visual changes, nausea, vomiting, diarrhea, constipation, dizziness, abdominal pain, skin rash, fevers, chills, night sweats, weight loss, swollen lymph  nodes, body aches, joint swelling, muscle aches, chest pain, shortness of breath, mood changes.   Objective Blood pressure 128/78, pulse 105, height 5\' 10"  (1.778 m), weight 278 lb (126.1 kg), SpO2 93 %.  General: No apparent distress alert and oriented x3 mood and affect normal, dressed appropriately.  HEENT: Pupils equal, extraocular movements intact  Respiratory: Patient's speak in full sentences and does not appear short of breath  Cardiovascular: No lower extremity edema, non tender, no erythema  Skin: Warm dry intact with no signs of infection or rash on extremities or on axial skeleton.  Abdomen: Soft nontender  Neuro: Cranial nerves II through XII are intact, neurovascularly intact in all extremities with 2+ DTRs and 2+ pulses.  Lymph: No lymphadenopathy of posterior or anterior cervical chain or axillae bilaterally.  Gait normal with good balance and coordination.  MSK:  Non tender with full range of motion and good stability and symmetric strength and tone of shoulders, wrist, hip, knee and ankles bilaterally.  Back Exam:  Inspection: Unremarkable  Motion: Flexion 45 deg, Extension 45 deg, Side Bending to 45 deg bilaterally,  Rotation to 45 deg bilaterally  SLR laying: Negative  XSLR laying: Negative  Palpable tenderness: Nontender on exam today FABER: negative. Sensory change: Gross sensation intact to all lumbar and sacral dermatomes.  Reflexes: 2+ at both patellar tendons, 2+ at achilles tendons, Babinski's downgoing.  Strength at foot  Plantar-flexion: 5/5 Dorsi-flexion: 5/5 Eversion: 5/5 Inversion: 5/5  Leg strength  Quad: 5/5 Hamstring: 5/5 Hip flexor: 5/5 Hip abductors: 5/5  Gait unremarkable.  Elbow: Right Unremarkable to inspection. Range of motion full pronation, supination, flexion, extension. Strength is full to all of the above directions Stable to varus, valgus stress. Negative moving valgus stress test. Patient still has some minimal tenderness over the  tricep area but does have positive tenderness over the lateral epicondylar region especially against resisted extension of the ECRB. Ulnar nerve does not sublux. Negative cubital tunnel Tinel's.      Impression and Recommendations:     This case required medical decision making of moderate complexity.

## 2015-02-22 NOTE — Telephone Encounter (Signed)
Per CY-let patient know he has mild sleep apnea and CPAP treatment would be best option. Please place order for new CPAP auto 5-20, humidifier,choice of face mask, and supplies DX OSA with download in 2 weeks. Pt will need to keep appt in October for follow up. Thanks.

## 2015-02-22 NOTE — Telephone Encounter (Signed)
LMTCB x 1 

## 2015-02-22 NOTE — Telephone Encounter (Signed)
Pt walked in asking about the results of his sleep study. I don't see anything charted. Please advise. Pt states to call work number first [phone] then home number [phone].

## 2015-02-22 NOTE — Telephone Encounter (Signed)
Spoke with pt, aware of results/recs.  Order placed.  Nothing further needed at this time.

## 2015-02-22 NOTE — Assessment & Plan Note (Signed)
Lateral Epicondylitis: Elbow anatomy was reviewed, and tendinopathy was explained.  Pt. given a formal rehab program. Series of concentric and eccentric exercises should be done starting with no weight, work up to 1 lb, hammer, etc.  Use counterforce strap if working or using hands.  Formal PT would be beneficial. Emphasized stretching an cross-friction massage Emphasized proper palms up lifting biomechanics to unload ECRB  

## 2015-02-22 NOTE — Progress Notes (Signed)
Pre visit review using our clinic review tool, if applicable. No additional management support is needed unless otherwise documented below in the visit note. 

## 2015-02-22 NOTE — Patient Instructions (Signed)
Good to see you Ice still is good Nitro patch alternate spots Continue the  Exercises  Wear wrist brace at night See me again in 3 weeks.

## 2015-02-22 NOTE — Telephone Encounter (Signed)
Pt cb, please cb at home or mobile numbers listed

## 2015-03-15 ENCOUNTER — Encounter: Payer: Self-pay | Admitting: Family Medicine

## 2015-03-15 ENCOUNTER — Encounter: Payer: Self-pay | Admitting: *Deleted

## 2015-03-15 ENCOUNTER — Ambulatory Visit (INDEPENDENT_AMBULATORY_CARE_PROVIDER_SITE_OTHER): Payer: Worker's Compensation | Admitting: Family Medicine

## 2015-03-15 VITALS — BP 136/82 | HR 84 | Wt 279.0 lb

## 2015-03-15 DIAGNOSIS — M7711 Lateral epicondylitis, right elbow: Secondary | ICD-10-CM

## 2015-03-15 DIAGNOSIS — M999 Biomechanical lesion, unspecified: Secondary | ICD-10-CM | POA: Insufficient documentation

## 2015-03-15 DIAGNOSIS — M9902 Segmental and somatic dysfunction of thoracic region: Secondary | ICD-10-CM

## 2015-03-15 DIAGNOSIS — M549 Dorsalgia, unspecified: Secondary | ICD-10-CM

## 2015-03-15 DIAGNOSIS — M779 Enthesopathy, unspecified: Principal | ICD-10-CM

## 2015-03-15 DIAGNOSIS — M778 Other enthesopathies, not elsewhere classified: Secondary | ICD-10-CM

## 2015-03-15 MED ORDER — MELOXICAM 15 MG PO TABS: 15.0000 mg | ORAL_TABLET | Freq: Every day | ORAL | Status: DC

## 2015-03-15 MED ORDER — TIZANIDINE HCL 4 MG PO TABS: 4.0000 mg | ORAL_TABLET | Freq: Every evening | ORAL | Status: DC

## 2015-03-15 NOTE — Progress Notes (Signed)
[name] [place], [place] [address] Phone: [phone] Subjective:       CC: Tricep tendinitis follow up [id number] [name] [name] is a 49 y.o. male coming in with complaint of mid back pain. Patient was seen previously and did have more of a scapular dysfunction. Patient states that this scapular dysfunction seems to be occurring again. Patient was on his tractor recently. Patient states it is more on the left side. States that he had to take a muscle relaxer as well as an anti-inflammatory to help. States that it has not improved over the course last 3 days.  Found to have a triceps tendinitis on the right side and has been on limited duty at work. Patient though is feeling much better. States that on Sunday when he did a lot of activity it seemed to make it worse. Denies though any numbness or tingling. Patient is looking forward to getting back to regular duty.   Past Medical History  Diagnosis Date  . ALLERGIC RHINITIS   . Asthma, mild intermittent   . Allergic conjunctivitis   . Kidney stone    Past Surgical History  Procedure Laterality Date  . Wisdom tooth extraction    . Lithotripsy     Social History  Substance Use Topics  . Smoking status: Never Smoker   . Smokeless tobacco: Never Used  . Alcohol Use: None   Family History  Problem Relation Age of Onset  . Asthma Father   . Other Father     bronchitis   Allergies  Allergen Reactions  . Fluticasone-Salmeterol     REACTION: jaw joint pain     Past medical history, social, surgical and family history all reviewed in electronic medical record.   Review of Systems: No headache, visual changes, nausea, vomiting, diarrhea, constipation, dizziness, abdominal pain, skin rash, fevers, chills, night sweats, weight loss, swollen lymph nodes, body aches, joint swelling, muscle aches, chest pain, shortness of breath, mood changes.   Objective Blood pressure 136/82,  pulse 84, weight 279 lb (126.554 kg).  General: No apparent distress alert and oriented x3 mood and affect normal, dressed appropriately.  HEENT: Pupils equal, extraocular movements intact  Respiratory: Patient's speak in full sentences and does not appear short of breath  Cardiovascular: No lower extremity edema, non tender, no erythema  Skin: Warm dry intact with no signs of infection or rash on extremities or on axial skeleton.  Abdomen: Soft nontender  Neuro: Cranial nerves II through XII are intact, neurovascularly intact in all extremities with 2+ DTRs and 2+ pulses.  Lymph: No lymphadenopathy of posterior or anterior cervical chain or axillae bilaterally.  Gait normal with good balance and coordination.  MSK:  Non tender with full range of motion and good stability and symmetric strength and tone of shoulders, wrist, hip, knee and ankles bilaterally.  Back Exam:  Inspection: Unremarkable  Motion: Flexion 45 deg, Extension 45 deg, Side Bending to 45 deg bilaterally,  Rotation to 45 deg bilaterally  SLR laying: Negative  XSLR laying: Negative  Palpable tenderness: tender over paraspinal musculature and trapezius of the left scapular region. FABER: negative. Sensory change: Gross sensation intact to all lumbar and sacral dermatomes.  Reflexes: 2+ at both patellar tendons, 2+ at achilles tendons, Babinski's downgoing.  Strength at foot  Plantar-flexion: 5/5 Dorsi-flexion: 5/5 Eversion: 5/5 Inversion: 5/5  Leg strength  Quad: 5/5 Hamstring: 5/5 Hip flexor: 5/5 Hip abductors: 5/5  Gait unremarkable.  Elbow: Right  Unremarkable to inspection. Range of motion full pronation, supination, flexion, extension. Strength is full to all of the above directions Stable to varus, valgus stress. Negative moving valgus stress test. nontender over the area. Ulnar nerve does not sublux. Negative cubital tunnel Tinel's.  Osteopathic findings T3 extended rotated and side bent left    Impression  and Recommendations:     This case required medical decision making of moderate complexity.

## 2015-03-15 NOTE — Progress Notes (Signed)
Pre visit review using our clinic review tool, if applicable. No additional management support is needed unless otherwise documented below in the visit note. 

## 2015-03-15 NOTE — Assessment & Plan Note (Signed)
Resolved at this time. We'll monitor. Discuss proper lifting techniques again. Continuing the home exercises and the nitroglycerin as needed.

## 2015-03-15 NOTE — Assessment & Plan Note (Signed)
More muscle imbalances. We discussed icing regimen and home exercise. We discussed which activities doing which ones to potentially avoid. Patient will come back and see me again in 3-4 weeks if any worsening then continue with osteopathic manipulation.

## 2015-03-15 NOTE — Assessment & Plan Note (Signed)
Resolved at this time. Patient is ready to return to full duty next week. We discussed icing regimen and taking the weekend regularly. Patient given anti-inflammatory as well as muscle relaxer prescription if needed. Patient and will come back and see me again in 3-4 weeks to make sure completely resolved.

## 2015-03-15 NOTE — Patient Instructions (Addendum)
Good to see you I am glad we have round the corner at this time.  Conitnue the exercises if you can 2 times a week for the next 6 weeks.  Nitro continue daily for 2 weeks alternating sides then go 2 times a week on each side for 2 weeks then discontinue If pain worsens increase again.  Careful mowing If manipulation helps then we can do this regularly.  meloxicam daily if needed Zanaflex at night (muscle relaxer) Full duty on Monday.  See me again in 3-4 weeks.

## 2015-03-15 NOTE — Assessment & Plan Note (Signed)
Decision today to treat with OMT was based on Physical Exam  After verbal consent patient was treated with HVLA, ME techniques in thoracic and rib areas  Patient tolerated the procedure well with improvement in symptoms  Patient given exercises, stretches and lifestyle modifications  See medications in patient instructions if given  Patient will follow up in 3 weeks

## 2015-03-17 ENCOUNTER — Other Ambulatory Visit: Payer: Self-pay | Admitting: Internal Medicine

## 2015-03-17 MED ORDER — BUDESONIDE-FORMOTEROL FUMARATE 160-4.5 MCG/ACT IN AERO: 2.0000 | INHALATION_SPRAY | Freq: Two times a day (BID) | RESPIRATORY_TRACT | Status: DC

## 2015-03-17 NOTE — Telephone Encounter (Signed)
Received paper refill request for symbicort Refilled per CY's recs Symbicort 160 2 puffs twice daily X 3 refills Nothing further needed

## 2015-03-20 ENCOUNTER — Telehealth: Payer: Self-pay | Admitting: Family Medicine

## 2015-03-20 MED ORDER — GABAPENTIN 100 MG PO CAPS: 200.0000 mg | ORAL_CAPSULE | Freq: Every day | ORAL | Status: DC

## 2015-03-20 MED ORDER — TRAMADOL HCL 50 MG PO TABS: 50.0000 mg | ORAL_TABLET | Freq: Three times a day (TID) | ORAL | Status: DC | PRN

## 2015-03-20 NOTE — Telephone Encounter (Signed)
Call patient and tell him Tramadol 50mg  2 times daily as needed Gabapentin 200mg  at night. I ordered medicine.  Please fax tramadol

## 2015-03-20 NOTE — Telephone Encounter (Signed)
Discussed with pt, faxed tramadol.

## 2015-03-20 NOTE — Telephone Encounter (Signed)
Patient stated that he is still having pain left arm shoulder and into his neck. could you send him something in a little stronger for the pain? Cell # [phone]

## 2015-04-10 ENCOUNTER — Telehealth: Payer: Self-pay

## 2015-04-10 NOTE — Telephone Encounter (Signed)
Pt called in and said that his appt was suppose to be on the 20th at 11:30 and not the 21st.  He is was off the 20th.  Is there anyway he can come in tomorrow at 11:30 in instead of the 21st?

## 2015-04-10 NOTE — Telephone Encounter (Signed)
Pt is coming in tomorrow @ 8am.

## 2015-04-11 ENCOUNTER — Ambulatory Visit (INDEPENDENT_AMBULATORY_CARE_PROVIDER_SITE_OTHER): Payer: 59

## 2015-04-11 ENCOUNTER — Encounter: Payer: Self-pay | Admitting: Family Medicine

## 2015-04-11 ENCOUNTER — Ambulatory Visit (INDEPENDENT_AMBULATORY_CARE_PROVIDER_SITE_OTHER): Payer: Worker's Compensation | Admitting: Family Medicine

## 2015-04-11 VITALS — BP 132/86 | HR 80 | Ht 70.0 in | Wt 280.0 lb

## 2015-04-11 DIAGNOSIS — M9902 Segmental and somatic dysfunction of thoracic region: Secondary | ICD-10-CM

## 2015-04-11 DIAGNOSIS — M7711 Lateral epicondylitis, right elbow: Secondary | ICD-10-CM | POA: Diagnosis not present

## 2015-04-11 DIAGNOSIS — M549 Dorsalgia, unspecified: Secondary | ICD-10-CM

## 2015-04-11 DIAGNOSIS — Z23 Encounter for immunization: Secondary | ICD-10-CM | POA: Diagnosis not present

## 2015-04-11 DIAGNOSIS — M999 Biomechanical lesion, unspecified: Secondary | ICD-10-CM

## 2015-04-11 MED ORDER — MELOXICAM 15 MG PO TABS
15.0000 mg | ORAL_TABLET | Freq: Every day | ORAL | Status: DC
Start: 2015-04-11 — End: 2015-08-09

## 2015-04-11 MED ORDER — TIZANIDINE HCL 4 MG PO TABS: 4.0000 mg | ORAL_TABLET | Freq: Every evening | ORAL | Status: DC

## 2015-04-11 NOTE — Assessment & Plan Note (Signed)
Patient responded well to osteopathic manipulation again. We discussed schedule him muscle relaxer at night. Patient has tramadol for breakthrough pain. Can continue the meloxicam but we'll not do the other anti-inflammatory's. We discussed postural changes and ergonomics and proper lifting mechanics. Patient come back and see me again in 4-6 weeks for further evaluation and treatment.

## 2015-04-11 NOTE — Progress Notes (Signed)
Pre visit review using our clinic review tool, if applicable. No additional management support is needed unless otherwise documented below in the visit note. 

## 2015-04-11 NOTE — Assessment & Plan Note (Signed)
Patient did do a trial regular work and states that he is feeling much better. Not having any pain. Continue to be active. Patient is released for full duty completely.

## 2015-04-11 NOTE — Assessment & Plan Note (Signed)
Decision today to treat with OMT was based on Physical Exam  After verbal consent patient was treated with HVLA, ME techniques in thoracic and rib areas  Patient tolerated the procedure well with improvement in symptoms  Patient given exercises, stretches and lifestyle modifications  See medications in patient instructions if given  Patient will follow up in 4-6 weeks                      

## 2015-04-11 NOTE — Patient Instructions (Addendum)
Good to see you Continue the vitamins Muscle relaxer (tinazidine) at night Meloxicam daily and stop motrin OK to add tylenol (650mg ) if needed 3 times a day Continue the exercises 3 times a week.  Talk Dr. Annamaria Boots about the prednisone.  Continue the singulair.  I am sure we will be in contact with yor employer.  See me again 4-6 weeks.

## 2015-04-11 NOTE — Progress Notes (Signed)
[name] [place] [place], [place] [address] Phone: [phone] Subjective:       CC: Tricep tendinitis follow up [id number] [name] [name] is a 49 y.o. male coming in with complaint of mid back pain. Patient was seen previously and did have more of a scapular dysfunction. Patient states that this scapular dysfunction seems to be occurring again. Patient states that it seems to be only at night now. Does not affect him during the day. States that if he lays on the opposite side he notices some discomfort. Patient states that he can keep him up at night. Has noticed some mild improvement with the meloxicam. States that daily activities seem to be easy.  Patient's tricep tendinitis and lateral epicondylitis is resolved at this time he feels like.  Past Medical History  Diagnosis Date  . ALLERGIC RHINITIS   . Asthma, mild intermittent   . Allergic conjunctivitis   . Kidney stone    Past Surgical History  Procedure Laterality Date  . Wisdom tooth extraction    . Lithotripsy     Social History  Substance Use Topics  . Smoking status: Never Smoker   . Smokeless tobacco: Never Used  . Alcohol Use: None   Family History  Problem Relation Age of Onset  . Asthma Father   . Other Father     bronchitis   Allergies  Allergen Reactions  . Fluticasone-Salmeterol     REACTION: jaw joint pain     Past medical history, social, surgical and family history all reviewed in electronic medical record.   Review of Systems: No headache, visual changes, nausea, vomiting, diarrhea, constipation, dizziness, abdominal pain, skin rash, fevers, chills, night sweats, weight loss, swollen lymph nodes, body aches, joint swelling, muscle aches, chest pain, shortness of breath, mood changes.   Objective Blood pressure 132/86, pulse 80, height 5\' 10"  (1.778 m), weight 280 lb (127.007 kg), SpO2 94 %.  General: No apparent distress alert and oriented x3 mood  and affect normal, dressed appropriately.  HEENT: Pupils equal, extraocular movements intact  Respiratory: Patient's speak in full sentences and does not appear short of breath  Cardiovascular: No lower extremity edema, non tender, no erythema  Skin: Warm dry intact with no signs of infection or rash on extremities or on axial skeleton.  Abdomen: Soft nontender  Neuro: Cranial nerves II through XII are intact, neurovascularly intact in all extremities with 2+ DTRs and 2+ pulses.  Lymph: No lymphadenopathy of posterior or anterior cervical chain or axillae bilaterally.  Gait normal with good balance and coordination.  MSK:  Non tender with full range of motion and good stability and symmetric strength and tone of shoulders, wrist, hip, knee and ankles bilaterally.  Back Exam:  Inspection: Unremarkable  Motion: Flexion 45 deg, Extension 45 deg, Side Bending to 45 deg bilaterally,  Rotation to 45 deg bilaterally  SLR laying: Negative  XSLR laying: Negative  Palpable tenderness: Continued tenderness and muscle spasm of the trapezius on the left side FABER: negative. Sensory change: Gross sensation intact to all lumbar and sacral dermatomes.  Reflexes: 2+ at both patellar tendons, 2+ at achilles tendons, Babinski's downgoing.  Strength at foot  Plantar-flexion: 5/5 Dorsi-flexion: 5/5 Eversion: 5/5 Inversion: 5/5  Leg strength  Quad: 5/5 Hamstring: 5/5 Hip flexor: 5/5 Hip abductors: 5/5  Gait unremarkable.  Elbow: Right Unremarkable to inspection. Range of motion full pronation, supination, flexion, extension. Strength is full to all of the above directions Stable  to varus, valgus stress. Negative moving valgus stress test. nontender over the area. Ulnar nerve does not sublux. Negative cubital tunnel Tinel's.  Osteopathic findings T3 extended rotated and side bent left T8 extended rotated and side bent left L2 flexed rotated and side bent right Cervical C4 flexed rotated and side  bent left    Impression and Recommendations:     This case required medical decision making of moderate complexity.

## 2015-04-12 ENCOUNTER — Ambulatory Visit: Payer: 59 | Admitting: Family Medicine

## 2015-05-04 ENCOUNTER — Telehealth: Payer: Self-pay | Admitting: Internal Medicine

## 2015-05-04 MED ORDER — AMOXICILLIN-POT CLAVULANATE 875-125 MG PO TABS: 1.0000 | ORAL_TABLET | Freq: Two times a day (BID) | ORAL | Status: DC

## 2015-05-04 NOTE — Telephone Encounter (Signed)
Spoke with [name] [name]. He reports he had finished 10 days of augmentin BID and is still feeling sick. C/o prod cough (light yellow phlem), wheezing, chest tx, lots of nasal cong, PND.  I offered OV but declined today and declined tomorrow. He wants a refill on the Augmentin. CDY out of the office. Please advise SN thanks  Allergies  Allergen Reactions  . Fluticasone-Salmeterol     REACTION: jaw joint pain     Current Outpatient Prescriptions on File Prior to Visit  Medication Sig Dispense Refill  . albuterol (VENTOLIN HFA) 108 (90 BASE) MCG/ACT inhaler Inhale 2 puffs into the lungs every 6 (six) hours as needed for wheezing or shortness of breath. 3 Inhaler 3  . amoxicillin-clavulanate (AUGMENTIN) 875-125 MG per tablet Take 1 tablet by mouth 2 (two) times daily.  1  . azithromycin (ZITHROMAX) 250 MG tablet 2 today then one daily for infection as directed 90 tablet 11  . budesonide-formoterol (SYMBICORT) 160-4.5 MCG/ACT inhaler Inhale 2 puffs into the lungs 2 (two) times daily. Rinse mouth 3 Inhaler 3  . Cholecalciferol (VITAMIN D3) 1000 UNITS CAPS Take by mouth 2 (two) times daily.    . clorazepate (TRANXENE) 7.5 MG tablet TAKE 1 TABLET BY MOUTH TWICE A DAY AS NEEDED 180 tablet 3  . Diclofenac Sodium 2 % SOLN Apply 1 pump twice daily 112 g 3  . doxycycline (VIBRAMYCIN) 100 MG capsule Take 1 capsule (100 mg total) by mouth 2 (two) times daily. 20 capsule 0  . fluconazole (DIFLUCAN) 100 MG tablet Take 1 tablet (100 mg total) by mouth daily. As directed 7 tablet 11  . fluticasone (FLONASE) 50 MCG/ACT nasal spray Place 2 sprays into both nostrils daily. 48 g 3  . gabapentin (NEURONTIN) 100 MG capsule Take 2 capsules (200 mg total) by mouth at bedtime. 60 capsule 3  . ipratropium-albuterol (DUONEB) 0.5-2.5 (3) MG/3ML SOLN 1 neb 4 times daily as needed 1080 mL 3  . meloxicam (MOBIC) 15 MG tablet Take 1 tablet (15 mg total) by mouth daily. 90 tablet 1  . Misc Natural Products (TURMERIC CURCUMIN) CAPS Take  by mouth 2 (two) times daily.    . montelukast (SINGULAIR) 10 MG tablet 1 daily 90 tablet 3  . nitroGLYCERIN (NITRODUR - DOSED IN MG/24 HR) 0.2 mg/hr patch 1/4 patch daily 30 patch 1  . predniSONE (DELTASONE) 5 MG tablet Take 5 mg by mouth 2 (two) times daily.  3  . promethazine-codeine (PHENERGAN WITH CODEINE) 6.25-10 MG/5ML syrup Take 5 mLs by mouth every 6 (six) hours as needed for cough. 200 mL 0  . sodium chloride (OCEAN) 0.65 % nasal spray 1 spray by Nasal route as needed.      [name] tiZANidine (ZANAFLEX) 4 MG tablet Take 1 tablet (4 mg total) by mouth Nightly. 30 tablet 2  . traMADol (ULTRAM) 50 MG tablet Take 1 tablet (50 mg total) by mouth every 8 (eight) hours as needed. 60 tablet 0  . Umeclidinium-Vilanterol (ANORO ELLIPTA) 62.5-25 MCG/INH AEPB Inhale 1 puff into the lungs daily. 1 each 0   No current facility-administered medications on file prior to visit.

## 2015-05-04 NOTE — Telephone Encounter (Signed)
Per SN: okay to call in Augmentin 875 1 po BID #14, take align once daily while on ABX and f/u with Dr. Solon Augusta spoke with pt. Aware and nothing further needed

## 2015-05-17 ENCOUNTER — Ambulatory Visit: Payer: 59 | Admitting: Family Medicine

## 2015-05-17 ENCOUNTER — Encounter: Payer: Self-pay | Admitting: Internal Medicine

## 2015-05-17 ENCOUNTER — Ambulatory Visit (INDEPENDENT_AMBULATORY_CARE_PROVIDER_SITE_OTHER): Payer: 59 | Admitting: Internal Medicine

## 2015-05-17 VITALS — BP 124/68 | HR 94 | Ht 70.0 in | Wt 276.4 lb

## 2015-05-17 DIAGNOSIS — G4733 Obstructive sleep apnea (adult) (pediatric): Secondary | ICD-10-CM | POA: Diagnosis not present

## 2015-05-17 DIAGNOSIS — J4540 Moderate persistent asthma, uncomplicated: Secondary | ICD-10-CM

## 2015-05-17 NOTE — Patient Instructions (Signed)
Order [facility] change CPAP range to auto 5-15    Dx OSA  Download for pressure compliance  Order- refer to orthodontist Dr Oneal Grout   Consider oral appliance for OSA

## 2015-05-17 NOTE — Assessment & Plan Note (Signed)
He indicates much better control in the last few months. Medications discussed. He has had flu vaccine.

## 2015-05-17 NOTE — Assessment & Plan Note (Signed)
He has started CPAP with auto titration/[facility] but offers various somatic discomforts as reasons he has not been wearing it. I get the feeling he is just not going to be happy with this approach at this time. Weight loss is advised. Alternative therapies discussed. Plan-referred to Dr. Kalman Shan for consideration of an oral appliance to treat sleep apnea. Meanwhile we will change his auto titration range to 5-15

## 2015-05-17 NOTE — Progress Notes (Signed)
Patient ID: STACIE KNUTZEN, male    DOB: [birth year], 49 y.o.   MRN: [id number]  HPI 80 yoM followed for hx of asthma, rhinitis and chronic sinusitis. Last here December 26, 2009. Since then he had extensive sinus surgery and polypectomy by Dr Erik Obey 03/24/78, which did help nasal congestion. They noted the pollen issues this Spring. Slow prednisone taper ended 2 weeks ago. Off that he again got congested in chest, wheezing, short of breath interfering with sleep. We called short prednisone taper which has helped, but not enough. He continues Symbicort, Flonase, Singulair. Dr Erik Obey treated for thrush. Denies diabetes. He notes weight gain on prednisone, Skin test positive and on allergy vaccine in the past. 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  12/18/10- Asthma, rhinitis, chronic sinusitis s/p sinus surgery/ polypectomy.  We restarted prednisone 5 mg and he is using 1-3 daily. Feels "no better no worse". Duleara 200  is the same as Symbicort 160. Nasonex works better than Fluticasone. He and his wife discussed Xolair and decided not to risk it for fear of side effects. He continues to work in [employer], but says lung disease is not thought to be associated with lung disease. Nasal airway now seems great, 3 months after surgery. Still wakes once/ night to cough up white mucus. Can't breathe w/o prednisone. Feels better outdoors.   03/18/11-  Asthma, rhinitis, chronic sinusitis s/p sinus surgery/ polypectomy.  Cough remains productive, with much postnasal drip. Continue neti pot. Denies aspiration/ choke while eating. Some off and on wheeze.  Insurance wouldn't pay for [employer]. He associated back pain with Dulera, relieved by returning to Symbicort Uses nebulizer 3 x most days. Denies glaucoma or prostatism> Discussed Spiriva as a drying option to try. Since nasal surgery by Dr Erik Obey he drains more. Nasonex better than  fluticasone. Not using an antihistamine.  Prednisone 2-3 x 5 mg daily; tries to skip it but never can. We talked again about trying Xolair- he says his wife was frightened by reported side effects. 57 yoM followed for hx of asthma, rhinitis and chronic sinusitis.  06/19/11- 67 yoM never smoker followed for hx of asthma, rhinitis and chronic sinusitis. After a very difficult half year, he says he finally feels that he is at least more stable. There is extra stress from dealing with his sick and elderly father. Tranxene has been a big help with the stress. Since he is doing less "stress eating" he has lost 10 pounds. He likes Spiriva and uses his nebulizer twice a day. We have continued him taking daily maintenance prednisone between 10 and 15 mg daily. This has caused thrush. Since the sinus surgery, he still has no sinus infection but continues using his sinus rinse. Cough continues to be productive with scant yellow. Often he can't get it to come out and will wheeze. He denies fever, sweat, sore throat, swollen glands, blood.  10/17/11- 57 yoM never smoker followed for hx of asthma, rhinitis and chronic sinusitis. Acute bronchitis exacerbation slowly resolving over 11 weeks. Hoarseness is slowly getting better. Still doing saline nasal rinse every day. Cough is always productive of mucus which is light yellow to green. Now off of antibiotics. Still taking prednisone 15 mg daily-can't get lower. Continues Symbicort.  01/21/12- 18 yoM never smoker followed for hx of asthma, rhinitis and chronic sinusitis. Can tell a big difference with taking Azithromycin once daily; not using rescue inhaler as  often now. Son has graduated from high school, removing a significant source of stress in the family. Hoarseness comes and goes, because of which he can't sing. We discussed thrush, reflux and postnasal drainage as possible contributors. He cannot get lower than 15 mg prednisone daily. Much less use of rescue  inhaler as noted. He decided side effect risk was too great to try Xolair shots. He does use AeroChamber with Symbicort. Has cut down some on Spiriva but uses nebulizer twice a day.  07/28/12- 55 yoM never smoker followed for hx of asthma, rhinitis and chronic sinusitis. FOLLOWS FOR: sick in November and had to get RX; sickness keeps going around in family; slight wheezing and SOB since. A viral syndrome respiratory infection was passing around in his family in November. We had called in Augmentin which helped. He was well until Christmas when he had another flare. Persistent cough with scant yellow sputum, no fever or blood. We had tried leaving him on maintenance Zithromax. Using 1 puff Symbicort one time daily. Continues tranxene. No longer having sinus infections since the sinus surgery but continues Flonase and uses a mask for yard work. Continues prednisone 15 mg daily; 10 mg was not enough to keep him stable.  01/27/13- 39 yoM never smoker followed for hx of asthma, rhinitis and chronic sinusitis. FOLLOWS FOR: pt reports he has had some "rough days" throughout the winter, but since warmer weather breathing is doing better; feels like he is at a baseline Weather limiting wants to work outside especially in the winter. Wears a mask to mow. Tried Cefdinir- no help. Likes azithromycin used in intervals when needed. We discussed anti-inflammatory effect. Daily nebulizer 2 or 3 times/DuoNeb. Continues other meds as discussed. CXR 07/30/12 IMPRESSION:  No edema or consolidation.  Original Report Authenticated By: Lowella Grip, M.D.  08/03/13-47 yoM never smoker followed for hx of asthma, rhinitis and chronic sinusitis.  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 Unusual exertion in cold air triggered asthma. Now on work days he takes 15 mg prednisone, on days off he takes 10 mg. Using nebulizer machine  about 3 times daily.  11/02/13- 47 yoM never smoker followed for hx of asthma, rhinitis and chronic sinusitis. FOLLOWS FOR: Pt states after using Breo he got sick with URI(thinks he caught from other people-?medication causing some side effects). Was given 2 abx since last visit to help with this. Since completing the meds he feels good and wants to know if he should go back on Breo. Has not used Breo or Spriva since Jan 2015(Muscle pains went away since stopping Spriva). Continues to use Symbicort("can't live without it").  Using nebulizer at least once daily. Feels controlled now. Minor sneezing from pollen. Continues maintenance prednisone 15 mg daily and maintenance Zithromax for chronic bronchitis depression. Uses mask to mow or do yard work. 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  02/03/14- 47 yoM never smoker followed for hx of chronic obstructive asthma, rhinitis and chronic sinusitis. FOLLOWS FOR: patient states he has not felt well since hot weather has set in.  Pt stopped Zithromax and feels like he getting sick again. Did well after last flare in April. Now blames hot weather on easy weakness and dyspnea with exertion outdoors. Not much cough or wheeze. After 2 months off of maintenance Zithromax he just restarted. Cough is chronic and productive of white to yellow nonbloody sputum. Maintenance  prednisone 15 mg daily. Using his nebulizer machine 3-4 times daily. Office Spirometry 02/03/2014-severe obstructive airways disease. FVC 3.13/62%, FEV1 2.01/50%, FEV1/FVC 0.64, FEF 25-75% 0.87/22%.  07/27/14- 63 yoM never smoker followed for hx of chronic obstructive asthma, rhinitis and chronic sinusitis. Maintenance prednisone Stopped maintenance zithromax- use for acute infections FOLLOWS FOR: Pt reports having cough in November-used Zithromax to treat upper respiratory issues. Pt states that he is still having cough(decreased). Pt reports using nebulizer and  albuterol hfa more frequent.  Exacerbation of bronchitis with increased cough. Not using Spiriva and has been using Symbicort only once daily because of cost. Medications reviewed Rhinitis much improved and he has sense of smell back. CXR 02/03/14 IMPRESSION: Negative exam. Electronically Signed  By: Inge Rise M.D.  On: 02/03/2014 14:00  01/26/15- 73 yoM never smoker followed for hx of chronic obstructive asthma, rhinitis and chronic sinusitis. Maintenance prednisone 15 mg daily Follows for: c/o prod cough with dark brown. Treated for torn tendons and muscles by Dr. Ortencia Kick.  Some cough and wheeze most days. Discussed use of Symbicort. Rescue inhaler at least once on most days. Wife concerned he may have obstructive sleep apnea because of witnessed snore CXR 12/14/14 IMPRESSION: No active cardiopulmonary disease. Electronically Signed  By: Lahoma Crocker M.D.  On: 12/14/2014 11:47  05/17/15- 64 yoM never smoker followed for hx of chronic obstructive asthma, rhinitis and chronic sinusitis. Maintenance prednisone 15 mg daily FOLLOWS FOR:Pt has had hard time with sleep due to back injury as well;has not started using CPAP machine-tough getting use to it. Pt would like to discuss Meloxicam usagefor his breathing-states he could tell a difference(breathing little easier) Rx was given by Dr smith-sports medicine MD on first floor. Unattended Hpme Sleep Test 02/09/15- mild OSA/ AHI 9.3/ hr, desat to 85%, weight 276 lbs CPAP autotitration [employer] He blames a series of back and shoulder injuries making it hard to use CPAP. Uncomfortable rolling around, pulling on hose etc. We discussed oral appliance therapy as an alternative. Weight loss remains at goal. Breathing has been much better controlled without acute exacerbation on stable medications.  Review of Systems-See HPI Constitutional:    No- night sweats ,fevers, chills, fatigue, lassitude. HEENT:   No - headaches,   Difficulty swallowing, Tooth/dental problems, Sore throat,                No sneezing, itching, ear ache, CV:  No chest pain,  Orthopnea, PND, swelling in lower extremities, anasarca, dizziness, palpitations GI  No heartburn, indigestion, abdominal pain, nausea, vomiting,  Resp:.See HPI   +Some productive cough   No coughing up of blood.  No-change in color of mucus,                + wheeze Skin: no rash or lesions. GU: . MS:  No joint pain or swelling.  [name] Psych:   change in mood or affect.  depression and  anxiety.  No memory loss.  Today Objective:   Physical Exam General- Alert, Oriented, Affect-appropriate, Distress- none acute, + overweight/thick neck Skin- rash-none, lesions- none, excoriation- none Lymphadenopathy- none Head- atraumatic            Eyes- Gross vision intact, PERRLA, conjunctivae clear secretions            Ears- Hearing, canals normal            Nose- Clear, no-Septal dev, mucus, polyps, erosion, perforation             Throat- Mallampati  III- IV , mucosa clear , drainage- none, tonsils- atrophic. , Neck- flexible , trachea midline, no stridor , thyroid nl, carotid no bruit Chest - symmetrical excursion , unlabored           Heart/CV- RRR , no murmur , no gallop  , no rub, nl s1 s2                           - JVD- none , edema- none, stasis changes- none, varices- none           Lung-  Wheeze-none, + few crackles right lung, unlabored,   Cough-none , dullness-none, rub- none           Chest wall-  Abd-  Br/ Gen/ Rectal- Not done, not indicated Extrem- cyanosis- none, clubbing, none, atrophy- none, strength- nl Neuro- grossly intact to observation

## 2015-06-01 ENCOUNTER — Other Ambulatory Visit: Payer: Self-pay | Admitting: *Deleted

## 2015-06-01 ENCOUNTER — Telehealth: Payer: Self-pay | Admitting: Internal Medicine

## 2015-06-01 MED ORDER — CLORAZEPATE DIPOTASSIUM 7.5 MG PO TABS: ORAL_TABLET | ORAL | Status: DC

## 2015-06-01 NOTE — Telephone Encounter (Signed)
Called and spoke with pt. He stated that [facility] had not received his rx for clorazepate to be refilled. I spoke with Mendel Ryder and she stated that she printed off the rx for CY to sign and faxed it to [facility]. I explained to the patient that rx was faxed this morning. Pt voiced understanding and had no further questions. Nothing further needed. Will sign off on message.

## 2015-06-08 ENCOUNTER — Other Ambulatory Visit: Payer: Self-pay | Admitting: Internal Medicine

## 2015-07-13 ENCOUNTER — Telehealth: Payer: Self-pay | Admitting: Internal Medicine

## 2015-07-13 MED ORDER — PROMETHAZINE-CODEINE 6.25-10 MG/5ML PO SYRP: 5.0000 mL | ORAL_SOLUTION | Freq: Four times a day (QID) | ORAL | Status: DC | PRN

## 2015-07-13 MED ORDER — AMOXICILLIN-POT CLAVULANATE 875-125 MG PO TABS: 1.0000 | ORAL_TABLET | Freq: Two times a day (BID) | ORAL | Status: DC

## 2015-07-13 NOTE — Telephone Encounter (Signed)
Spoke with pt, aware of recs.  rx's called in to preferred pharmacy.  Nothing further needed.

## 2015-07-13 NOTE — Telephone Encounter (Signed)
Spoke with pt. States that he is getting bronchitis. Reports coughing, runny nose and sinus pressure. Denies chest tightness, SOB, wheezing or fever. Cough is producing yellow mucus. Onset ws this morning. Would like Augmentin and a cough syrup to be prescribed. CY - please advise. Thanks.  Allergies  Allergen Reactions  . Fluticasone-Salmeterol     REACTION: jaw joint pain   Current Outpatient Prescriptions on File Prior to Visit  Medication Sig Dispense Refill  . albuterol (VENTOLIN HFA) 108 (90 BASE) MCG/ACT inhaler Inhale 2 puffs into the lungs every 6 (six) hours as needed for wheezing or shortness of breath. 3 Inhaler 3  . budesonide-formoterol (SYMBICORT) 160-4.5 MCG/ACT inhaler Inhale 2 puffs into the lungs 2 (two) times daily. Rinse mouth 3 Inhaler 3  . Cholecalciferol (VITAMIN D3) 1000 UNITS CAPS Take by mouth 2 (two) times daily.    . clorazepate (TRANXENE) 7.5 MG tablet TAKE 1 TABLET BY MOUTH TWICE A DAY AS NEEDED 180 tablet 3  . Diclofenac Sodium 2 % SOLN Apply 1 pump twice daily 112 g 3  . fluticasone (FLONASE) 50 MCG/ACT nasal spray Place 2 sprays into both nostrils daily. 48 g 3  . gabapentin (NEURONTIN) 100 MG capsule Take 2 capsules (200 mg total) by mouth at bedtime. 60 capsule 3  . ipratropium-albuterol (DUONEB) 0.5-2.5 (3) MG/3ML SOLN 1 neb 4 times daily as needed 1080 mL 3  . meloxicam (MOBIC) 15 MG tablet Take 1 tablet (15 mg total) by mouth daily. (Patient not taking: Reported on 05/17/2015) 90 tablet 1  . Misc Natural Products (TURMERIC CURCUMIN) CAPS Take by mouth 2 (two) times daily.    . montelukast (SINGULAIR) 10 MG tablet 1 daily 90 tablet 3  . nitroGLYCERIN (NITRODUR - DOSED IN MG/24 HR) 0.2 mg/hr patch 1/4 patch daily 30 patch 1  . predniSONE (DELTASONE) 5 MG tablet Take 5 mg by mouth 2 (two) times daily.  3  . sodium chloride (OCEAN) 0.65 % nasal spray 1 spray by Nasal route as needed.      [name] tiZANidine (ZANAFLEX) 4 MG tablet Take 1 tablet (4 mg total)  by mouth Nightly. 30 tablet 2  . traMADol (ULTRAM) 50 MG tablet Take 1 tablet (50 mg total) by mouth every 8 (eight) hours as needed. 60 tablet 0   No current facility-administered medications on file prior to visit.

## 2015-07-13 NOTE — Telephone Encounter (Signed)
Offer augmentin 875, # 14, 1 twice daily           Prometh codeine cough syrup, 200 ml, 5 ml every 6 hours if needed for cough

## 2015-08-01 ENCOUNTER — Other Ambulatory Visit: Payer: Self-pay | Admitting: Internal Medicine

## 2015-08-04 ENCOUNTER — Encounter (HOSPITAL_COMMUNITY): Payer: Self-pay | Admitting: Emergency Medicine

## 2015-08-04 ENCOUNTER — Emergency Department (INDEPENDENT_AMBULATORY_CARE_PROVIDER_SITE_OTHER)
Admission: EM | Admit: 2015-08-04 | Discharge: 2015-08-04 | Disposition: A | Discharge status: Home / Self Care | Payer: 59 | Source: Home / Self Care | Attending: Family Medicine | Admitting: Family Medicine

## 2015-08-04 ENCOUNTER — Emergency Department (INDEPENDENT_AMBULATORY_CARE_PROVIDER_SITE_OTHER): Payer: 59

## 2015-08-04 ENCOUNTER — Other Ambulatory Visit (HOSPITAL_COMMUNITY): Admission: AD | Admit: 2015-08-04 | Payer: Self-pay | Source: Ambulatory Visit | Admitting: Family Medicine

## 2015-08-04 ENCOUNTER — Other Ambulatory Visit (HOSPITAL_COMMUNITY)
Admission: RE | Admit: 2015-08-04 | Discharge: 2015-08-04 | Disposition: A | Discharge status: Home / Self Care | Payer: 59 | Source: Ambulatory Visit | Attending: Family Medicine | Admitting: Family Medicine

## 2015-08-04 DIAGNOSIS — N23 Unspecified renal colic: Secondary | ICD-10-CM

## 2015-08-04 DIAGNOSIS — R109 Unspecified abdominal pain: Secondary | ICD-10-CM | POA: Insufficient documentation

## 2015-08-04 DIAGNOSIS — Z87442 Personal history of urinary calculi: Secondary | ICD-10-CM | POA: Diagnosis not present

## 2015-08-04 LAB — POCT URINALYSIS DIP (DEVICE)
Bilirubin Urine: NEGATIVE
GLUCOSE, UA: NEGATIVE mg/dL
Ketones, ur: NEGATIVE mg/dL
Leukocytes, UA: NEGATIVE
Nitrite: NEGATIVE
PROTEIN: NEGATIVE mg/dL
UROBILINOGEN UA: 0.2 mg/dL (ref 0.0–1.0)
pH: 5.5 (ref 5.0–8.0)

## 2015-08-04 MED ORDER — KETOROLAC TROMETHAMINE 60 MG/2ML IM SOLN
60.0000 mg | Freq: Once | INTRAMUSCULAR | Status: AC
  Administered 2015-08-04: 60 mg via INTRAMUSCULAR

## 2015-08-04 MED ORDER — KETOROLAC TROMETHAMINE 10 MG PO TABS: 10.0000 mg | ORAL_TABLET | Freq: Four times a day (QID) | ORAL | Status: DC | PRN

## 2015-08-04 MED ORDER — KETOROLAC TROMETHAMINE 60 MG/2ML IM SOLN
INTRAMUSCULAR | Status: AC
  Filled 2015-08-04: qty 2

## 2015-08-04 NOTE — ED Notes (Signed)
C/o lower back pain which started this morning Lumbar pain radiating to left side pelvic area  Urine has been discolored

## 2015-08-04 NOTE — ED Provider Notes (Signed)
CSN: [id number]     Arrival date & time 08/04/15  54 History   First MD Initiated Contact with Patient 08/04/15 1435     Chief Complaint  Patient presents with  . Back Pain  . Abdominal Pain   (Consider location/radiation/quality/duration/timing/severity/associated sxs/prior Treatment) HPI History obtained from patient:   LOCATION:left flank SEVERITY:8 DURATION:since this morning CONTEXT:sudden onset, pain left flank QUALITY:similar to previous kidney stones MODIFYING FACTORS:hydrocodone at home with some relief ASSOCIATED SYMPTOMS: none TIMING:constant OCCUPATION:  Past Medical History  Diagnosis Date  . ALLERGIC RHINITIS   . Asthma, mild intermittent   . Allergic conjunctivitis   . Kidney stone    Past Surgical History  Procedure Laterality Date  . Wisdom tooth extraction    . Lithotripsy     Family History  Problem Relation Age of Onset  . Asthma Father   . Other Father     bronchitis   Social History  Substance Use Topics  . Smoking status: Never Smoker   . Smokeless tobacco: Never Used  . Alcohol Use: None    Review of Systems ROS +'ve left flank pain  Denies: HEADACHE, NAUSEA, ABDOMINAL PAIN, CHEST PAIN, CONGESTION, DYSURIA, SHORTNESS OF BREATH  Allergies  Fluticasone-salmeterol  Home Medications   Prior to Admission medications   Medication Sig Start Date End Date Taking? Authorizing Provider  albuterol (VENTOLIN HFA) 108 (90 BASE) MCG/ACT inhaler Inhale 2 puffs into the lungs every 6 (six) hours as needed for wheezing or shortness of breath. 01/26/15 01/26/16  Deneise Lever, MD  amoxicillin-clavulanate (AUGMENTIN) 875-125 MG tablet Take 1 tablet by mouth 2 (two) times daily. 07/13/15   Deneise Lever, MD  budesonide-formoterol (SYMBICORT) 160-4.5 MCG/ACT inhaler Inhale 2 puffs into the lungs 2 (two) times daily. Rinse mouth 03/17/15 03/16/16  Deneise Lever, MD  Cholecalciferol (VITAMIN D3) 1000 UNITS CAPS Take by mouth 2 (two) times daily.     Historical Provider, MD  clorazepate (TRANXENE) 7.5 MG tablet TAKE 1 TABLET BY MOUTH TWICE A DAY AS NEEDED 06/01/15   Deneise Lever, MD  Diclofenac Sodium 2 % SOLN Apply 1 pump twice daily 01/11/15   Lyndal Pulley, DO  fluticasone [facility]) 50 MCG/ACT nasal spray Place 2 sprays into both nostrils daily. 01/26/15 01/26/16  Deneise Lever, MD  gabapentin (NEURONTIN) 100 MG capsule Take 2 capsules (200 mg total) by mouth at bedtime. 03/20/15   Lyndal Pulley, DO  ipratropium-albuterol (DUONEB) 0.5-2.5 (3) MG/3ML SOLN 1 neb 4 times daily as needed 01/16/15   Deneise Lever, MD  meloxicam (MOBIC) 15 MG tablet Take 1 tablet (15 mg total) by mouth daily. Patient not taking: Reported on 05/17/2015 04/11/15   Lyndal Pulley, DO  Misc Natural Products (TURMERIC CURCUMIN) CAPS Take by mouth 2 (two) times daily.    Historical Provider, MD  montelukast (SINGULAIR) 10 MG tablet Take 1 tablet by mouth  daily 08/01/15   Deneise Lever, MD  nitroGLYCERIN (NITRODUR - DOSED IN MG/24 HR) 0.2 mg/hr patch 1/4 patch daily 02/01/15   Lyndal Pulley, DO  predniSONE (DELTASONE) 5 MG tablet Take 5 mg by mouth 2 (two) times daily. 01/16/15   Historical Provider, MD  promethazine-codeine (PHENERGAN WITH CODEINE) 6.25-10 MG/5ML syrup Take 5 mLs by mouth every 6 (six) hours as needed for cough. 07/13/15   Deneise Lever, MD  sodium chloride (OCEAN) 0.65 % nasal spray 1 spray by Nasal route as needed.      Historical Provider, MD  tiZANidine (ZANAFLEX) 4 MG tablet Take 1 tablet (4 mg total) by mouth Nightly. 04/11/15   Lyndal Pulley, DO  traMADol (ULTRAM) 50 MG tablet Take 1 tablet (50 mg total) by mouth every 8 (eight) hours as needed. 03/20/15   Lyndal Pulley, DO   Meds Ordered and Administered this Visit   Medications  ketorolac (TORADOL) injection 60 mg (not administered)    BP 155/99 mmHg  Pulse 88  Temp(Src) 98.8 F (37.1 C) (Oral)  Resp 18  SpO2 95% No data found.   Physical Exam  Constitutional: He is  oriented to person, place, and time. He appears well-developed and well-nourished. He appears distressed.  HENT:  Head: Normocephalic and atraumatic.  Pulmonary/Chest: Effort normal and breath sounds normal.  Abdominal: Soft. There is tenderness.  Left CVA-T  Neurological: He is alert and oriented to person, place, and time.  Skin: Skin is warm and dry.  Psychiatric: He has a normal mood and affect. His behavior is normal. Judgment and thought content normal.  Nursing note and vitals reviewed.   ED Course  Procedures (including critical care time)  Labs Review Labs Reviewed  POCT URINALYSIS DIP (DEVICE) - Abnormal; Notable for the following:    Hgb urine dipstick LARGE (*)    All other components within normal limits    Imaging Review Dg Abd 1 View  08/04/2015  CLINICAL DATA:  Left lower back pain.  Gross hematuria. EXAM: ABDOMEN - 1 VIEW COMPARISON:  October 04, 2004. FINDINGS: The bowel gas pattern is normal. No definite renal calculi are noted. Probable phleboliths are seen in the left pelvis. IMPRESSION: No evidence of bowel obstruction or ileus. No definite renal calculi are noted. Electronically Signed   By: Marijo Conception, M.D.   On: 08/04/2015 15:39     Visual Acuity Review  Right Eye Distance:   Left Eye Distance:   Bilateral Distance:    Right Eye Near:   Left Eye Near:    Bilateral Near:        Discussed case with Dr. Louis Meckel. He has suggested KUB and toradol. If stone is <70mm patient may be discharged on flomax and office appointment in 1 week, if >5 mm transfer to [facility] possible stent to be placed.  Discussed plan with patient and his wife Claiborne Billings). MDM  No diagnosis found.  #2 discussion with Dr. Louis Meckel: Will see patient next week in office Patient is advised to continue home symptomatic treatment.  Patient is advised that if there are new or worsening symptoms or attend the emergency department, or contact primary care provider. Instructions of care provided  discharged home in stable condition. Rx for toradol provided to patient.  THIS NOTE WAS GENERATED USING A VOICE RECOGNITION SOFTWARE PROGRAM. ALL REASONABLE EFFORTS  WERE MADE TO PROOFREAD THIS DOCUMENT FOR ACCURACY.     Konrad Felix, PA 08/04/15 1553

## 2015-08-04 NOTE — Discharge Instructions (Signed)
Kidney Stones  Kidney stones (urolithiasis) are solid masses that form inside your kidneys. The intense pain is caused by the stone moving through the kidney, ureter, bladder, and urethra (urinary tract). When the stone moves, the ureter starts to spasm around the stone. The stone is usually passed in your pee (urine).   HOME CARE  · Drink enough fluids to keep your pee clear or pale yellow. This helps to get the stone out.  · Take a 24-hour pee (urine) sample as told by your doctor. You may need to take another sample every 6-12 months.  · Strain all pee through the provided strainer. Do not pee without peeing through the strainer, not even once. If you pee the stone out, catch it in the strainer. The stone may be as small as a grain of salt. Take this to your doctor. This will help your doctor figure out what you can do to try to prevent more kidney stones.  · Only take medicine as told by your doctor.  · Make changes to your daily diet as told by your doctor. You may be told to:    Limit how much salt you eat.    Eat 5 or more servings of fruits and vegetables each day.    Limit how much meat, poultry, fish, and eggs you eat.  · Keep all follow-up visits as told by your doctor. This is important.  · Get follow-up X-rays as told by your doctor.  GET HELP IF:  You have pain that gets worse even if you have been taking pain medicine.  GET HELP RIGHT AWAY IF:   · Your pain does not get better with medicine.  · You have a fever or shaking chills.  · Your pain increases and gets worse over 18 hours.  · You have new belly (abdominal) pain.  · You feel faint or pass out.  · You are unable to pee.     This information is not intended to replace advice given to you by your health care provider. Make sure you discuss any questions you have with your health care provider.     Document Released: 12/25/2007 Document Revised: 03/29/2015 Document Reviewed: 12/09/2012  Elsevier Interactive Patient Education ©2016 Elsevier  Inc.

## 2015-08-06 LAB — URINE CULTURE
CULTURE: NO GROWTH
SPECIAL REQUESTS: NORMAL

## 2015-08-07 ENCOUNTER — Ambulatory Visit (HOSPITAL_COMMUNITY): Payer: 59 | Admitting: Anesthesiology

## 2015-08-07 ENCOUNTER — Observation Stay (HOSPITAL_COMMUNITY)
Admission: AD | Admit: 2015-08-07 | Discharge: 2015-08-09 | Disposition: A | Discharge status: Home / Self Care | Payer: 59 | Source: Ambulatory Visit | Attending: Urology | Admitting: Urology

## 2015-08-07 ENCOUNTER — Encounter (HOSPITAL_COMMUNITY): Payer: Self-pay | Admitting: *Deleted

## 2015-08-07 ENCOUNTER — Encounter (HOSPITAL_COMMUNITY)
Admission: AD | Disposition: A | Discharge status: Home / Self Care | Payer: Self-pay | Source: Ambulatory Visit | Attending: Urology

## 2015-08-07 ENCOUNTER — Other Ambulatory Visit: Payer: Self-pay | Admitting: Urology

## 2015-08-07 DIAGNOSIS — F419 Anxiety disorder, unspecified: Secondary | ICD-10-CM | POA: Insufficient documentation

## 2015-08-07 DIAGNOSIS — N10 Acute tubulo-interstitial nephritis: Secondary | ICD-10-CM | POA: Diagnosis not present

## 2015-08-07 DIAGNOSIS — G4733 Obstructive sleep apnea (adult) (pediatric): Secondary | ICD-10-CM | POA: Diagnosis not present

## 2015-08-07 DIAGNOSIS — N179 Acute kidney failure, unspecified: Secondary | ICD-10-CM | POA: Diagnosis present

## 2015-08-07 DIAGNOSIS — J4520 Mild intermittent asthma, uncomplicated: Secondary | ICD-10-CM | POA: Insufficient documentation

## 2015-08-07 DIAGNOSIS — N136 Pyonephrosis: Secondary | ICD-10-CM | POA: Diagnosis not present

## 2015-08-07 DIAGNOSIS — J449 Chronic obstructive pulmonary disease, unspecified: Secondary | ICD-10-CM | POA: Diagnosis not present

## 2015-08-07 DIAGNOSIS — Z6839 Body mass index (BMI) 39.0-39.9, adult: Secondary | ICD-10-CM | POA: Diagnosis not present

## 2015-08-07 DIAGNOSIS — J309 Allergic rhinitis, unspecified: Secondary | ICD-10-CM | POA: Insufficient documentation

## 2015-08-07 DIAGNOSIS — N201 Calculus of ureter: Secondary | ICD-10-CM | POA: Diagnosis present

## 2015-08-07 DIAGNOSIS — Z87442 Personal history of urinary calculi: Secondary | ICD-10-CM | POA: Insufficient documentation

## 2015-08-07 DIAGNOSIS — N3289 Other specified disorders of bladder: Secondary | ICD-10-CM | POA: Diagnosis not present

## 2015-08-07 DIAGNOSIS — Z7951 Long term (current) use of inhaled steroids: Secondary | ICD-10-CM | POA: Insufficient documentation

## 2015-08-07 DIAGNOSIS — M1990 Unspecified osteoarthritis, unspecified site: Secondary | ICD-10-CM | POA: Insufficient documentation

## 2015-08-07 DIAGNOSIS — N4889 Other specified disorders of penis: Secondary | ICD-10-CM | POA: Diagnosis not present

## 2015-08-07 DIAGNOSIS — Z7952 Long term (current) use of systemic steroids: Secondary | ICD-10-CM | POA: Diagnosis not present

## 2015-08-07 DIAGNOSIS — E875 Hyperkalemia: Secondary | ICD-10-CM | POA: Diagnosis not present

## 2015-08-07 DIAGNOSIS — N138 Other obstructive and reflux uropathy: Secondary | ICD-10-CM | POA: Diagnosis not present

## 2015-08-07 DIAGNOSIS — N401 Enlarged prostate with lower urinary tract symptoms: Secondary | ICD-10-CM | POA: Insufficient documentation

## 2015-08-07 DIAGNOSIS — N289 Disorder of kidney and ureter, unspecified: Secondary | ICD-10-CM

## 2015-08-07 DIAGNOSIS — G4730 Sleep apnea, unspecified: Secondary | ICD-10-CM | POA: Diagnosis not present

## 2015-08-07 DIAGNOSIS — Z79899 Other long term (current) drug therapy: Secondary | ICD-10-CM | POA: Diagnosis not present

## 2015-08-07 HISTORY — PX: CYSTOSCOPY W/ URETERAL STENT PLACEMENT: SHX1429

## 2015-08-07 HISTORY — DX: Other complications of anesthesia, initial encounter: T88.59XA

## 2015-08-07 HISTORY — DX: Adverse effect of unspecified anesthetic, initial encounter: T41.45XA

## 2015-08-07 HISTORY — DX: Chronic obstructive pulmonary disease, unspecified: J44.9

## 2015-08-07 LAB — CBC
HEMATOCRIT: 50.4 % (ref 39.0–52.0)
HEMOGLOBIN: 16.1 g/dL (ref 13.0–17.0)
MCH: 30.2 pg (ref 26.0–34.0)
MCHC: 31.9 g/dL (ref 30.0–36.0)
MCV: 94.6 fL (ref 78.0–100.0)
Platelets: 215 10*3/uL (ref 150–400)
RBC: 5.33 MIL/uL (ref 4.22–5.81)
RDW: 14.5 % (ref 11.5–15.5)
WBC: 12.8 10*3/uL — ABNORMAL HIGH (ref 4.0–10.5)

## 2015-08-07 LAB — COMPREHENSIVE METABOLIC PANEL
ALBUMIN: 4 g/dL (ref 3.5–5.0)
ALK PHOS: 46 U/L (ref 38–126)
ALT: 37 U/L (ref 17–63)
ANION GAP: 11 (ref 5–15)
AST: 28 U/L (ref 15–41)
BILIRUBIN TOTAL: 0.6 mg/dL (ref 0.3–1.2)
BUN: 26 mg/dL — AB (ref 6–20)
CALCIUM: 9.6 mg/dL (ref 8.9–10.3)
CO2: 25 mmol/L (ref 22–32)
CREATININE: 2.11 mg/dL — AB (ref 0.61–1.24)
Chloride: 106 mmol/L (ref 101–111)
GFR calc Af Amer: 41 mL/min — ABNORMAL LOW (ref >60)
GFR calc non Af Amer: 35 mL/min — ABNORMAL LOW (ref >60)
GLUCOSE: 104 mg/dL — AB (ref 65–99)
Potassium: 4.8 mmol/L (ref 3.5–5.1)
Sodium: 142 mmol/L (ref 135–145)
TOTAL PROTEIN: 7.8 g/dL (ref 6.5–8.1)

## 2015-08-07 LAB — LACTIC ACID, PLASMA: Lactic Acid, Venous: 1.2 mmol/L (ref 0.5–2.0)

## 2015-08-07 SURGERY — CYSTOSCOPY, FLEXIBLE, WITH STENT REPLACEMENT
Anesthesia: General | Laterality: Left

## 2015-08-07 MED ORDER — CIPROFLOXACIN IN D5W 400 MG/200ML IV SOLN
INTRAVENOUS | Status: AC
  Filled 2015-08-07: qty 200

## 2015-08-07 MED ORDER — PREDNISONE 5 MG PO TABS: 10.0000 mg | ORAL_TABLET | Freq: Every day | ORAL | Status: DC | PRN

## 2015-08-07 MED ORDER — PROPOFOL 10 MG/ML IV BOLUS
INTRAVENOUS | Status: DC | PRN
  Administered 2015-08-07: 200 mg via INTRAVENOUS

## 2015-08-07 MED ORDER — HYOSCYAMINE SULFATE 0.125 MG SL SUBL
0.1250 mg | SUBLINGUAL_TABLET | SUBLINGUAL | Status: DC | PRN
  Filled 2015-08-07: qty 1

## 2015-08-07 MED ORDER — ONDANSETRON HCL 4 MG/2ML IJ SOLN
INTRAMUSCULAR | Status: AC
  Filled 2015-08-07: qty 2

## 2015-08-07 MED ORDER — LACTATED RINGERS IV SOLN
INTRAVENOUS | Status: DC
Start: 2015-08-07 — End: 2015-08-07
  Administered 2015-08-07: 1000 mL via INTRAVENOUS
  Administered 2015-08-07: 19:00:00 via INTRAVENOUS

## 2015-08-07 MED ORDER — ZOLPIDEM TARTRATE 5 MG PO TABS: 5.0000 mg | ORAL_TABLET | Freq: Every evening | ORAL | Status: DC | PRN

## 2015-08-07 MED ORDER — PROPOFOL 10 MG/ML IV BOLUS
INTRAVENOUS | Status: AC
  Filled 2015-08-07: qty 20

## 2015-08-07 MED ORDER — ONDANSETRON HCL 4 MG/2ML IJ SOLN
INTRAMUSCULAR | Status: DC | PRN
  Administered 2015-08-07: 4 mg via INTRAVENOUS

## 2015-08-07 MED ORDER — MONTELUKAST SODIUM 10 MG PO TABS
10.0000 mg | ORAL_TABLET | Freq: Every day | ORAL | Status: DC
  Administered 2015-08-08: 10 mg via ORAL
  Filled 2015-08-07: qty 1

## 2015-08-07 MED ORDER — DIPHENHYDRAMINE HCL 12.5 MG/5ML PO ELIX: 12.5000 mg | ORAL_SOLUTION | Freq: Four times a day (QID) | ORAL | Status: DC | PRN

## 2015-08-07 MED ORDER — HYDROCODONE-ACETAMINOPHEN 5-325 MG PO TABS: 1.0000 | ORAL_TABLET | ORAL | Status: DC | PRN

## 2015-08-07 MED ORDER — HYDROMORPHONE HCL 1 MG/ML IJ SOLN: 0.5000 mg | INTRAMUSCULAR | Status: DC | PRN

## 2015-08-07 MED ORDER — MIDAZOLAM HCL 2 MG/2ML IJ SOLN
INTRAMUSCULAR | Status: AC
Start: 2015-08-07 — End: 2015-08-07
  Filled 2015-08-07: qty 2

## 2015-08-07 MED ORDER — POTASSIUM CHLORIDE IN NACL 20-0.45 MEQ/L-% IV SOLN
INTRAVENOUS | Status: DC
  Administered 2015-08-07: 22:00:00 via INTRAVENOUS
  Filled 2015-08-07 (×2): qty 1000

## 2015-08-07 MED ORDER — DIATRIZOATE MEGLUMINE 30 % UR SOLN
URETHRAL | Status: DC | PRN
  Administered 2015-08-07: 12 mL via URETHRAL

## 2015-08-07 MED ORDER — SODIUM CHLORIDE 0.9 % IR SOLN
Status: DC | PRN
  Administered 2015-08-07: 3000 mL

## 2015-08-07 MED ORDER — DEXAMETHASONE SODIUM PHOSPHATE 10 MG/ML IJ SOLN
INTRAMUSCULAR | Status: DC | PRN
  Administered 2015-08-07: 10 mg via INTRAVENOUS

## 2015-08-07 MED ORDER — LIDOCAINE HCL (CARDIAC) 20 MG/ML IV SOLN
INTRAVENOUS | Status: AC
  Filled 2015-08-07: qty 5

## 2015-08-07 MED ORDER — 0.9 % SODIUM CHLORIDE (POUR BTL) OPTIME
TOPICAL | Status: DC | PRN
  Administered 2015-08-07: 1000 mL

## 2015-08-07 MED ORDER — PREDNISONE 5 MG PO TABS
10.0000 mg | ORAL_TABLET | Freq: Every day | ORAL | Status: DC
  Administered 2015-08-08 – 2015-08-09 (×2): 10 mg via ORAL
  Filled 2015-08-07 (×2): qty 2

## 2015-08-07 MED ORDER — PREDNISONE 5 MG PO TABS: 5.0000 mg | ORAL_TABLET | Freq: Every day | ORAL | Status: DC | PRN

## 2015-08-07 MED ORDER — LIDOCAINE HCL (CARDIAC) 20 MG/ML IV SOLN
INTRAVENOUS | Status: DC | PRN
  Administered 2015-08-07: 100 mg via INTRAVENOUS

## 2015-08-07 MED ORDER — DIPHENHYDRAMINE HCL 50 MG/ML IJ SOLN: 12.5000 mg | Freq: Four times a day (QID) | INTRAMUSCULAR | Status: DC | PRN

## 2015-08-07 MED ORDER — ONDANSETRON HCL 4 MG/2ML IJ SOLN: 4.0000 mg | INTRAMUSCULAR | Status: DC | PRN

## 2015-08-07 MED ORDER — ALBUTEROL SULFATE (2.5 MG/3ML) 0.083% IN NEBU
2.5000 mg | INHALATION_SOLUTION | Freq: Four times a day (QID) | RESPIRATORY_TRACT | Status: DC | PRN
Start: 2015-08-07 — End: 2015-08-09
  Administered 2015-08-08 (×2): 2.5 mg via RESPIRATORY_TRACT
  Filled 2015-08-07 (×2): qty 3

## 2015-08-07 MED ORDER — IPRATROPIUM-ALBUTEROL 0.5-2.5 (3) MG/3ML IN SOLN: 3.0000 mL | Freq: Four times a day (QID) | RESPIRATORY_TRACT | Status: DC | PRN

## 2015-08-07 MED ORDER — FENTANYL CITRATE (PF) 100 MCG/2ML IJ SOLN
INTRAMUSCULAR | Status: AC
  Filled 2015-08-07: qty 2

## 2015-08-07 MED ORDER — HYDROMORPHONE HCL 1 MG/ML IJ SOLN: 0.2500 mg | INTRAMUSCULAR | Status: DC | PRN

## 2015-08-07 MED ORDER — FENTANYL CITRATE (PF) 100 MCG/2ML IJ SOLN
INTRAMUSCULAR | Status: DC | PRN
  Administered 2015-08-07 (×2): 50 ug via INTRAVENOUS

## 2015-08-07 MED ORDER — ACETAMINOPHEN 325 MG PO TABS: 650.0000 mg | ORAL_TABLET | ORAL | Status: DC | PRN

## 2015-08-07 MED ORDER — CIPROFLOXACIN IN D5W 400 MG/200ML IV SOLN
400.0000 mg | INTRAVENOUS | Status: AC
  Administered 2015-08-07: 400 mg via INTRAVENOUS

## 2015-08-07 MED ORDER — SODIUM CHLORIDE 0.9 % IR SOLN
Status: DC | PRN
  Administered 2015-08-07: 1000 mL

## 2015-08-07 MED ORDER — CIPROFLOXACIN IN D5W 400 MG/200ML IV SOLN
400.0000 mg | Freq: Two times a day (BID) | INTRAVENOUS | Status: DC
  Administered 2015-08-08: 400 mg via INTRAVENOUS
  Filled 2015-08-07: qty 200

## 2015-08-07 MED ORDER — BISACODYL 10 MG RE SUPP: 10.0000 mg | Freq: Every day | RECTAL | Status: DC | PRN

## 2015-08-07 MED ORDER — FLUTICASONE PROPIONATE 50 MCG/ACT NA SUSP
2.0000 | Freq: Every day | NASAL | Status: DC
  Filled 2015-08-07: qty 16

## 2015-08-07 MED ORDER — DEXAMETHASONE SODIUM PHOSPHATE 10 MG/ML IJ SOLN
INTRAMUSCULAR | Status: AC
  Filled 2015-08-07: qty 1

## 2015-08-07 MED ORDER — BUDESONIDE-FORMOTEROL FUMARATE 160-4.5 MCG/ACT IN AERO
2.0000 | INHALATION_SPRAY | Freq: Two times a day (BID) | RESPIRATORY_TRACT | Status: DC
  Filled 2015-08-07: qty 6

## 2015-08-07 MED ORDER — CLORAZEPATE DIPOTASSIUM 3.75 MG PO TABS: 7.5000 mg | ORAL_TABLET | Freq: Two times a day (BID) | ORAL | Status: DC | PRN

## 2015-08-07 MED ORDER — SALINE SPRAY 0.65 % NA SOLN: 1.0000 | Freq: Every day | NASAL | Status: DC | PRN

## 2015-08-07 SURGICAL SUPPLY — 11 items
BAG URO CATCHER STRL LF (MISCELLANEOUS) ×2 IMPLANT
CATH URET 5FR 28IN OPEN ENDED (CATHETERS) IMPLANT
CLOTH BEACON ORANGE TIMEOUT ST (SAFETY) ×2 IMPLANT
DRAPE CAMERA CLOSED 9X96 (DRAPES) ×2 IMPLANT
GLOVE SURG SS PI 8.0 STRL IVOR (GLOVE) IMPLANT
GOWN STRL REUS W/TWL XL LVL3 (GOWN DISPOSABLE) ×2 IMPLANT
GUIDEWIRE STR DUAL SENSOR (WIRE) ×2 IMPLANT
MANIFOLD NEPTUNE II (INSTRUMENTS) ×2 IMPLANT
PACK CYSTO (CUSTOM PROCEDURE TRAY) ×2 IMPLANT
STENT CONTOUR 6FRX26X.038 (STENTS) ×1 IMPLANT
TUBING CONNECTING 10 (TUBING) ×2 IMPLANT

## 2015-08-07 NOTE — Brief Op Note (Signed)
08/07/2015  6:23 PM  PATIENT:  [name] [name]  50 y.o. male  PRE-OPERATIVE DIAGNOSIS:  Left Ureteral Stone  POST-OPERATIVE DIAGNOSIS:  Left ureteral stone with pyonephrosis  PROCEDURE:  Procedure(s): CYSTOSCOPY, Left Retrograde Pyelogram, Left Ureteral Stent Placement (Left)  SURGEON:  Surgeon(s) and Role:    * Irine Seal, MD - Primary  PHYSICIAN ASSISTANT:   ASSISTANTS: none   ANESTHESIA:   general  EBL:     BLOOD ADMINISTERED:none  DRAINS: 6 x 26 left JJ stent   LOCAL MEDICATIONS USED:  NONE  SPECIMEN:  Source of Specimen:  urine culture  DISPOSITION OF SPECIMEN:  PATHOLOGY  COUNTS:  YES  TOURNIQUET:  * No tourniquets in log *  DICTATION: .Other Dictation: Dictation Number [id number]  PLAN OF CARE: Admit for overnight observation  PATIENT DISPOSITION:  PACU - hemodynamically stable.   Delay start of Pharmacological VTE agent (>24hrs) due to surgical blood loss or risk of bleeding: not applicable

## 2015-08-07 NOTE — Anesthesia Postprocedure Evaluation (Signed)
Anesthesia Post Note  Patient: [name] [name]  Procedure(s) Performed: Procedure(s) (LRB): CYSTOSCOPY, Left Retrograde Pyelogram, Left Ureteral Stent Placement (Left)  Patient location during evaluation: PACU Anesthesia Type: General Level of consciousness: awake and alert Pain management: pain level controlled Vital Signs Assessment: post-procedure vital signs reviewed and stable Respiratory status: spontaneous breathing, nonlabored ventilation, respiratory function stable and patient connected to nasal cannula oxygen Cardiovascular status: blood pressure returned to baseline and stable Postop Assessment: no signs of nausea or vomiting Anesthetic complications: no    Last Vitals:  Filed Vitals:   08/07/15 1945 08/07/15 2000  BP: 162/101 148/96  Pulse: 89 86  Temp:  36.7 C  Resp: 21 14    Last Pain: There were no vitals filed for this visit.               Samiya Mervin,W. EDMOND

## 2015-08-07 NOTE — Transfer of Care (Signed)
Immediate Anesthesia Transfer of Care Note  Patient: [name] [name]  Procedure(s) Performed: Procedure(s): CYSTOSCOPY, Left Retrograde Pyelogram, Left Ureteral Stent Placement (Left)  Patient Location: PACU  Anesthesia Type:General  Level of Consciousness: awake, alert , oriented and patient cooperative  Airway & Oxygen Therapy: Patient Spontanous Breathing and Patient connected to face mask oxygen  Post-op Assessment: Report given to RN, Post -op Vital signs reviewed and stable and Patient moving all extremities X 4  Post vital signs: stable  Last Vitals:  Filed Vitals:   08/07/15 1514 08/07/15 1837  BP: 158/94 143/94  Pulse: 94 96  Temp: 37.5 C   Resp: 18     Complications: No apparent anesthesia complications
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
## 2015-08-07 NOTE — Anesthesia Preprocedure Evaluation (Addendum)
Anesthesia Evaluation  Patient identified by MRN, date of birth, ID band Patient awake    Reviewed: Allergy & Precautions, H&P , NPO status , Patient's Chart, lab work & pertinent test results  Airway Mallampati: II  TM Distance: >3 FB Neck ROM: Full    Dental no notable dental hx. (+) Teeth Intact, Dental Advisory Given   Pulmonary asthma ,    Pulmonary exam normal breath sounds clear to auscultation       Cardiovascular negative cardio ROS   Rhythm:Regular Rate:Normal     Neuro/Psych Anxiety negative neurological ROS  negative psych ROS   GI/Hepatic negative GI ROS, Neg liver ROS,   Endo/Other  Morbid obesity  Renal/GU Renal disease  negative genitourinary   Musculoskeletal  (+) Arthritis , Osteoarthritis,    Abdominal   Peds  Hematology negative hematology ROS (+)   Anesthesia Other Findings   Reproductive/Obstetrics negative OB ROS                            Anesthesia Physical Anesthesia Plan  ASA: III  Anesthesia Plan: General   Post-op Pain Management:    Induction: Intravenous  Airway Management Planned: LMA  Additional Equipment:   Intra-op Plan:   Post-operative Plan: Extubation in OR  Informed Consent: I have reviewed the patients History and Physical, chart, labs and discussed the procedure including the risks, benefits and alternatives for the proposed anesthesia with the patient or authorized representative who has indicated his/her understanding and acceptance.   Dental advisory given  Plan Discussed with: CRNA  Anesthesia Plan Comments:         Anesthesia Quick Evaluation

## 2015-08-07 NOTE — Anesthesia Procedure Notes (Signed)
Procedure Name: LMA Insertion Date/Time: 08/07/2015 6:07 PM Performed by: Enrigue Catena E Pre-anesthesia Checklist: Patient identified, Emergency Drugs available, Suction available and Patient being monitored Patient Re-evaluated:Patient Re-evaluated prior to inductionOxygen Delivery Method: Circle System Utilized Preoxygenation: Pre-oxygenation with 100% oxygen Intubation Type: IV induction Ventilation: Mask ventilation without difficulty LMA: LMA inserted LMA Size: 5.0 Tube type: Oral Number of attempts: 1 Airway Equipment and Method: Stylet and Oral airway Placement Confirmation: positive ETCO2 Tube secured with: Tape Dental Injury: Teeth and Oropharynx as per pre-operative assessment

## 2015-08-08 ENCOUNTER — Encounter (HOSPITAL_COMMUNITY): Payer: Self-pay | Admitting: Urology

## 2015-08-08 ENCOUNTER — Other Ambulatory Visit: Payer: Self-pay | Admitting: Urology

## 2015-08-08 DIAGNOSIS — N179 Acute kidney failure, unspecified: Secondary | ICD-10-CM | POA: Diagnosis present

## 2015-08-08 DIAGNOSIS — N10 Acute tubulo-interstitial nephritis: Secondary | ICD-10-CM | POA: Diagnosis not present

## 2015-08-08 DIAGNOSIS — N201 Calculus of ureter: Secondary | ICD-10-CM

## 2015-08-08 DIAGNOSIS — E875 Hyperkalemia: Secondary | ICD-10-CM | POA: Diagnosis not present

## 2015-08-08 LAB — BASIC METABOLIC PANEL
ANION GAP: 9 (ref 5–15)
BUN: 32 mg/dL — ABNORMAL HIGH (ref 6–20)
CALCIUM: 9.9 mg/dL (ref 8.9–10.3)
CO2: 28 mmol/L (ref 22–32)
Chloride: 107 mmol/L (ref 101–111)
Creatinine, Ser: 1.89 mg/dL — ABNORMAL HIGH (ref 0.61–1.24)
GFR, EST AFRICAN AMERICAN: 46 mL/min — AB (ref >60)
GFR, EST NON AFRICAN AMERICAN: 40 mL/min — AB (ref >60)
GLUCOSE: 188 mg/dL — AB (ref 65–99)
Potassium: 5.7 mmol/L — ABNORMAL HIGH (ref 3.5–5.1)
Sodium: 144 mmol/L (ref 135–145)

## 2015-08-08 LAB — CBC
HCT: 47.6 % (ref 39.0–52.0)
HEMOGLOBIN: 14.8 g/dL (ref 13.0–17.0)
MCH: 29.7 pg (ref 26.0–34.0)
MCHC: 31.1 g/dL (ref 30.0–36.0)
MCV: 95.6 fL (ref 78.0–100.0)
PLATELETS: 242 10*3/uL (ref 150–400)
RBC: 4.98 MIL/uL (ref 4.22–5.81)
RDW: 14.5 % (ref 11.5–15.5)
WBC: 11.3 10*3/uL — ABNORMAL HIGH (ref 4.0–10.5)

## 2015-08-08 MED ORDER — CIPROFLOXACIN HCL 500 MG PO TABS
500.0000 mg | ORAL_TABLET | Freq: Two times a day (BID) | ORAL | Status: DC
  Administered 2015-08-08 – 2015-08-09 (×3): 500 mg via ORAL
  Filled 2015-08-08 (×3): qty 1

## 2015-08-08 MED ORDER — SODIUM CHLORIDE 0.9 % IV SOLN
INTRAVENOUS | Status: DC
  Administered 2015-08-08 – 2015-08-09 (×3): via INTRAVENOUS

## 2015-08-08 MED ORDER — SODIUM POLYSTYRENE SULFONATE 15 GM/60ML PO SUSP
15.0000 g | Freq: Once | ORAL | Status: AC
  Administered 2015-08-08: 15 g via ORAL
  Filled 2015-08-08: qty 60

## 2015-08-08 NOTE — Op Note (Signed)
NAMEDUEY, BEHRMAN NO.:  [id number]  MEDICAL RECORD NO.:  [id number]  LOCATION:  52                         FACILITY:  [facility]  PHYSICIAN:  Marshall Cork. Jeffie Pollock, M.D.    DATE OF BIRTH:  12-05-1965  DATE OF PROCEDURE:  08/07/2015 DATE OF DISCHARGE:                              OPERATIVE REPORT   PROCEDURE:  Cystoscopy with left retrograde pyelogram, insertion of left double-J stent.  PREOPERATIVE DIAGNOSIS:  Left mid ureteral stone.  POSTOPERATIVE DIAGNOSIS:  Left mid ureteral stone with pyonephrosis.  SURGEON:  Marshall Cork. Jeffie Pollock, M.D.  ANESTHESIA:  General.  SPECIMEN:  Urine culture.  DRAINS:  A 6-French x 26 cm left double-J stent.  BLOOD LOSS:  None.  COMPLICATIONS:  None.  INDICATIONS:  Mr. Messmer is a 50 year old white male, who presented today from the emergency room with a left mid ureteral stone. Initially, my plan was to do ureteroscopy, but he had a low-grade temperature of approximately 100.2 in the office and was felt that stenting was indicated.  He had a mild leukocytosis on preop labs at 12.8, but interestingly his creatinine was 2.11.  DESCRIPTION OF PROCEDURE:  He was taken to the operating room, where he was given Cipro.  General anesthetic was induced.  He was placed in lithotomy position.  His perineum and genitalia were prepped with Betadine solution.  He was draped in usual sterile fashion.  He did have PAS hose placed prior to prepping and draping.  Cystoscopy was performed using a 23-French scope and 30-degree lens. Examination revealed a normal urethra.  The external sphincter was intact.  Prostatic urethra short, bilobar hyperplasia with mild obstruction.  Examination of bladder revealed mild trabeculation.  No tumors or stones were noted.  Ureteral orifices were unremarkable.  The left ureteral orifice was cannulated with 5-French open-ended catheter and contrast was instilled.  Retrograde pyelogram revealed a normal distal  ureter.  There was a filling defect in the lower mid ureter consistent with a stone with some proximal dilation, but the stone appeared to flow freely in the dilated ureter after the retrograde.  At this point, I did not try to inject all the way to kidney for fear of infection.  A guidewire was then advanced, passed the stone into the kidney, and the open-ended catheter was removed.  There was brisk efflux of thick purulent material from the ureteral orifice.  At this point, a 6-French 26-cm double-J stent without string was inserted to the kidney under fluoroscopic guidance.  Wire was removed leaving good coil in the kidney and good coil in the bladder.  Some of the irrigant and debris from the kidney was collected and sent for culture.  The patient's bladder was drained.  He was taken down from lithotomy position after removal of the cystoscope.  His anesthetic was reversed. He was moved to recovery room in stable condition.  There were no complications, but I will keep him overnight because of the apparent pyonephrosis and acute renal insufficiency to ensure he does not become septic postprocedure.     Marshall Cork. Jeffie Pollock, M.D.     JJW/MEDQ  D:  08/07/2015  T:  08/08/2015  Job:  [id number]

## 2015-08-08 NOTE — Consult Note (Signed)
Requesting physician: Dr Jeffie Pollock  Reason for consultation: Hyperkalemia and AKI  History of Present Illness: 50 year old obese male with history of mildly dependent asthma and allergic rhinitis (follows with Dr. Annamaria Boots), OSA intolerant to CPAP, history of kidney stones who was admitted on 1/16 for increased hematuria and severe pain in his left flank radiating to the groin. Patient had ESWL 2 remotely. He was seen at the: Urgent care where a KUB was done which was unremarkable. He was discharged home on meloxicam and Toradol. On the day of admission he had some chills with worsening pain and came to the ED. He had a temperature of 100.59F blood work  done showed WBC of 12.8 she and acute kidney injury with elevated BUN and creatinine of 2.11. He was found to have an 8 mm obstructing left midureteral stone on CT scan done as outpatient. Patient admitted by urologist Dr. Jeffie Pollock and underwent cystoscopy with left retrograde pyelogram and insertion of left double-J stent. Patient tolerated procedure well. He was placed on IV normal saline with potassium supplement. This morning labs done showed some improvement in his acute kidney injury with creatinine 1.89 and had hyperkalemia with potassium of 5.7.  Hospitalists consulted for evaluation of his acute kidney injury and hyperkalemia.  Patient evaluated this morning. Reports some pain in his penile meatus with increased urinary frequency. Denies any further flank pain, chills, fever, nausea, vomiting, headache, blurred vision, chest pain, palpitation, shortness of breath, bowel symptoms.   Allergies:   Allergies  Allergen Reactions  . Fluticasone-Salmeterol     REACTION: jaw joint pain      Past Medical History  Diagnosis Date  . ALLERGIC RHINITIS   . Asthma, mild intermittent   . Allergic conjunctivitis   . Kidney stone   . Complication of anesthesia     difficulty waking up from lithotripsy and nasal surgery  . Sleep apnea     Cannot use  machine.   [name] COPD (chronic obstructive pulmonary disease) ([place])   . Bronchitis, chronic (HCC)     Past Surgical History  Procedure Laterality Date  . Wisdom tooth extraction    . Lithotripsy    . Nasal sinus surgery  2012    Medications:  Scheduled Meds: . budesonide-formoterol  2 puff Inhalation BID  . ciprofloxacin  500 mg Oral BID  . fluticasone  2 spray Each Nare Daily  . montelukast  10 mg Oral QHS  . predniSONE  10 mg Oral Q breakfast   Continuous Infusions: . sodium chloride     PRN Meds:.acetaminophen, albuterol, bisacodyl, clorazepate, diphenhydrAMINE **OR** diphenhydrAMINE, HYDROcodone-acetaminophen, hyoscyamine, ipratropium-albuterol, ondansetron, predniSONE **AND** predniSONE, sodium chloride, zolpidem  Social History:  reports that he has never smoked. He has never used smokeless tobacco. He reports that he drinks alcohol. His drug history is not on file.  Family History  Problem Relation Age of Onset  . Asthma Father   . Other Father     bronchitis    Review of Systems:  Constitutional: Denies fever, chills, diaphoresis, appetite change and fatigue.  HEENT: Denies : Hearing symptoms, congestion, difficult swallowing, neck pain or stiffness   Respiratory: Denies SOB, DOE, cough, chest tightness,  and wheezing.   Cardiovascular: Denies chest pain, palpitations and leg swelling.  Gastrointestinal: Denies nausea, vomiting, abdominal pain, diarrhea, constipation, blood in stool and abdominal distention.  Genitourinary: Dysuria, hematuria, denies flank pain Endocrine: Denies: hot or cold intolerance,polyuria, polydipsia. Musculoskeletal: Denies myalgias, back pain, joint swelling, arthralgias and gait problem.  Skin: Denies  pallor, rash and wound.  Neurological: Denies dizziness,syncope, weakness, light-headedness, numbness and headaches.  Hematological: Denies adenopathy.  Psychiatric/Behavioral: Denies confusion   Physical Exam:  Filed Vitals:    08/07/15 2015 08/07/15 2145 08/08/15 0200 08/08/15 0502  BP: 157/98 145/83 136/88 124/60  Pulse: 88 84 88 77  Temp: 99.4 F (37.4 C) 99 F (37.2 C) 97.9 F (36.6 C) 97.5 F (36.4 C)  TempSrc:    Oral  Resp: 16 20 16 16   Height:      Weight:      SpO2: 96% 98% 97% 96%     Intake/Output Summary (Last 24 hours) at 08/08/15 1033 Last data filed at 08/08/15 0523  Gross per 24 hour  Intake   1800 ml  Output      0 ml  Net   1800 ml    General: Middle aged morbidly obese male not in distress HEENT: Moist mucosa Chest: Clear bilaterally, no added sounds CVS: Normal S1-S2, no murmurs rub or gallop GI: Soft, nondistended, nontender, bowel sounds present, no CVA tenderness Musculoskeletal: Warm, no edema CNS: Alert and oriented  Labs on Admission:  CBC:    Component Value Date/Time   WBC 11.3* 08/08/2015 0530   HGB 14.8 08/08/2015 0530   HCT 47.6 08/08/2015 0530   PLT 242 08/08/2015 0530   MCV 95.6 08/08/2015 0530   NEUTROABS 5.4 11/23/2009 1110   LYMPHSABS 2.3 11/23/2009 1110   MONOABS 0.9 11/23/2009 1110   EOSABS 0.7 11/23/2009 1110   BASOSABS 0.1 11/23/2009 123456    Basic Metabolic Panel:    Component Value Date/Time   NA 144 08/08/2015 0530   K 5.7* 08/08/2015 0530   CL 107 08/08/2015 0530   CO2 28 08/08/2015 0530   BUN 32* 08/08/2015 0530   CREATININE 1.89* 08/08/2015 0530   GLUCOSE 188* 08/08/2015 0530   CALCIUM 9.9 08/08/2015 0530    Radiological Exams on Admission: No results found.  Assessment/Plan Principal Problem: Shocking left ureteral stone with acute pyelonephritis S/p  cystoscopy with left retrograde pyelogram and insertion of left double-J stent. Patient had mild acute pyelonephritis with flank pain, fevers and chills as stated with leukocytosis. He is on empiric ciprofloxacin. Urine culture pending. Pain control with when necessary Vicodin.  Acute kidney injury I suspect this is due to acute pyelonephritis and renal colic. Improving with  IV hydration and ureteral stent placement. Switched IV fluids to normal saline at 1 25 mL per hour. Monitor renal function in morning.  Hyperkalemia ( k-5.7) Patient was receiving IV fluids with potassium supplements. This has been discontinued. He also reports not having bowel movement since yesterday. I have ordered some Kayexalate. Repeat potassium in the morning.  Chronic asthma Stable. Continue home inhalers. Also on albuterol nebulizer at home on an as-needed basis which would be continued. On chronic low-dose prednisone.   DVT prophylaxis: SCDs while in bed. Patient ambulate in the hallway.  Diet: Regular.  Thank for the consult. Hospitalist will continue to follow tomorrow. If renal function and potassium improved in am patient should be stable for discharge medically.   Time Spent on Admission: 50 minutes  Matha Masse 08/08/2015, 10:33 AM

## 2015-08-08 NOTE — Progress Notes (Signed)
Patient ID: [name] [name], male   DOB: 06-12-1966, 50 y.o.   MRN: [id number] 1 Day Post-Op  Subjective: [name] [name] is feeling much better today with resolution of the flank pain.  He has mild stent irritation with penile pain.  His WBC count is down as is his Cr but it is still elevated at 1.89 and his potassium is up to 5.7.  ROS:  Review of Systems  Constitutional: Negative for fever and chills.  Gastrointestinal: Negative for abdominal pain.  Genitourinary: Positive for urgency. Negative for flank pain.    Anti-infectives: Anti-infectives    Start     Dose/Rate Route Frequency Ordered Stop   08/08/15 0845  ciprofloxacin (CIPRO) tablet 500 mg     500 mg Oral 2 times daily 08/08/15 0836     08/08/15 0600  ciprofloxacin (CIPRO) IVPB 400 mg  Status:  Discontinued     400 mg 200 mL/hr over 60 Minutes Intravenous Every 12 hours 08/07/15 2015 08/08/15 0836   08/07/15 1508  ciprofloxacin (CIPRO) IVPB 400 mg     400 mg 200 mL/hr over 60 Minutes Intravenous 60 min pre-op 08/07/15 1508 08/07/15 1751      Current Facility-Administered Medications  Medication Dose Route Frequency Provider Last Rate Last Dose  . 0.45 % NaCl with KCl 20 mEq / L infusion   Intravenous Continuous Irine Seal, MD 100 mL/hr at 08/07/15 2138    . acetaminophen (TYLENOL) tablet 650 mg  650 mg Oral Q4H PRN Irine Seal, MD      . albuterol (PROVENTIL) (2.5 MG/3ML) 0.083% nebulizer solution 2.5 mg  2.5 mg Inhalation Q6H PRN Irine Seal, MD      . bisacodyl (DULCOLAX) suppository 10 mg  10 mg Rectal Daily PRN Irine Seal, MD      . budesonide-formoterol [facility]) 160-4.5 MCG/ACT inhaler 2 puff  2 puff Inhalation BID Irine Seal, MD   2 puff at 08/07/15 2138  . ciprofloxacin (CIPRO) tablet 500 mg  500 mg Oral BID Irine Seal, MD      . clorazepate (TRANXENE) tablet 7.5 mg  7.5 mg Oral BID PRN Irine Seal, MD      . diphenhydrAMINE (BENADRYL) injection 12.5 mg  12.5 mg Intravenous Q6H PRN Irine Seal, MD       Or  .  diphenhydrAMINE (BENADRYL) 12.5 MG/5ML elixir 12.5 mg  12.5 mg Oral Q6H PRN Irine Seal, MD      . fluticasone (FLONASE) 50 MCG/ACT nasal spray 2 spray  2 spray Each Nare Daily Irine Seal, MD      . HYDROcodone-acetaminophen (NORCO/VICODIN) 5-325 MG per tablet 1-2 tablet  1-2 tablet Oral Q4H PRN Irine Seal, MD      . HYDROmorphone (DILAUDID) injection 0.5-1 mg  0.5-1 mg Intravenous Q2H PRN Irine Seal, MD      . hyoscyamine (LEVSIN SL) SL tablet 0.125 mg  0.125 mg Sublingual Q4H PRN Irine Seal, MD      . ipratropium-albuterol (DUONEB) 0.5-2.5 (3) MG/3ML nebulizer solution 3 mL  3 mL Inhalation Q6H PRN Irine Seal, MD      . montelukast (SINGULAIR) tablet 10 mg  10 mg Oral QHS Irine Seal, MD      . ondansetron [facility]) injection 4 mg  4 mg Intravenous Q4H PRN Irine Seal, MD      . predniSONE (DELTASONE) tablet 10 mg  10 mg Oral Q breakfast Irine Seal, MD   10 mg at 08/08/15 0738   And  . predniSONE (DELTASONE) tablet 5 mg  5 mg Oral Daily PRN Irine Seal, MD      . sodium chloride (OCEAN) 0.65 % nasal spray 1 spray  1 spray Nasal Daily PRN Irine Seal, MD      . zolpidem (AMBIEN) tablet 5 mg  5 mg Oral QHS PRN,MR X 1 Irine Seal, MD         Objective: Vital signs in last 24 hours: Temp:  [97.5 F (36.4 C)-99.5 F (37.5 C)] 97.5 F (36.4 C) (01/17 0502) Pulse Rate:  [77-98] 77 (01/17 0502) Resp:  [14-21] 16 (01/17 0502) BP: (124-162)/(60-101) 124/60 mmHg (01/17 0502) SpO2:  [96 %-100 %] 96 % (01/17 0502) Weight:  [126.1 kg (278 lb)] 126.1 kg (278 lb) (01/16 1516)  Intake/Output from previous day: 01/16 0701 - 01/17 0700 In: 1800 [P.O.:600; I.V.:1000; IV Piggyback:200] Out: -  Intake/Output this shift:     Physical Exam  Constitutional: He is well-developed, well-nourished, and in no distress.    Lab Results:   Recent Labs  08/07/15 1530 08/08/15 0530  WBC 12.8* 11.3*  HGB 16.1 14.8  HCT 50.4 47.6  PLT 215 242   BMET  Recent Labs  08/07/15 1610 08/08/15 0530  NA 142  144  K 4.8 5.7*  CL 106 107  CO2 25 28  GLUCOSE 104* 188*  BUN 26* 32*  CREATININE 2.11* 1.89*  CALCIUM 9.6 9.9   PT/INR No results for input(s): LABPROT, INR in the last 72 hours. ABG No results for input(s): PHART, HCO3 in the last 72 hours.  Invalid input(s): PCO2, PO2  Studies/Results: No results found.  Labs reviewed.   Assessment and Plan: He is feeling better post op with resolution of pain.  He is afebrile and his lactate was normal.  He has improving ARI but new hyperkalemia.  I will consult the hospitalist service about the elevated potassium prior to discharge.            Jethro Radke J 08/08/2015 [phone]

## 2015-08-09 ENCOUNTER — Encounter (HOSPITAL_BASED_OUTPATIENT_CLINIC_OR_DEPARTMENT_OTHER): Payer: Self-pay | Admitting: *Deleted

## 2015-08-09 DIAGNOSIS — N201 Calculus of ureter: Secondary | ICD-10-CM | POA: Diagnosis not present

## 2015-08-09 LAB — BASIC METABOLIC PANEL
ANION GAP: 9 (ref 5–15)
BUN: 29 mg/dL — ABNORMAL HIGH (ref 6–20)
CALCIUM: 9 mg/dL (ref 8.9–10.3)
CO2: 27 mmol/L (ref 22–32)
CREATININE: 1.64 mg/dL — AB (ref 0.61–1.24)
Chloride: 107 mmol/L (ref 101–111)
GFR, EST AFRICAN AMERICAN: 55 mL/min — AB (ref >60)
GFR, EST NON AFRICAN AMERICAN: 48 mL/min — AB (ref >60)
Glucose, Bld: 168 mg/dL — ABNORMAL HIGH (ref 65–99)
Potassium: 4.1 mmol/L (ref 3.5–5.1)
Sodium: 143 mmol/L (ref 135–145)

## 2015-08-09 LAB — URINE CULTURE: CULTURE: NO GROWTH

## 2015-08-09 MED ORDER — CIPROFLOXACIN HCL 250 MG PO TABS: 250.0000 mg | ORAL_TABLET | Freq: Two times a day (BID) | ORAL | Status: AC

## 2015-08-09 MED ORDER — HYDROCODONE-ACETAMINOPHEN 5-325 MG PO TABS: 1.0000 | ORAL_TABLET | ORAL | Status: DC | PRN

## 2015-08-09 NOTE — Progress Notes (Signed)
NPO AFTER MN.  ARRIVE AT 0730.  CURRENT LAB RESULTS IN CHART AND EPIC.  WILL TAKE AM MEDS AND INHALERS W/ SIPS OF WATER.

## 2015-08-09 NOTE — Discharge Summary (Signed)
Physician Discharge Summary  Patient ID: [name] [name] MRN: [id number] DOB/AGE: [birth year] 50 y.o.  Admit date: 08/07/2015 Discharge date: 08/09/2015  Admission Diagnoses:  Left ureteral stone  Discharge Diagnoses:  Principal Problem:   Left ureteral stone Active Problems:   Pyonephrosis   Acute renal insufficiency   Acute kidney injury ([place])   Hyperkalemia   Acute pyelonephritis   Past Medical History  Diagnosis Date  . ALLERGIC RHINITIS   . Asthma, mild intermittent   . Allergic conjunctivitis   . Kidney stone   . Complication of anesthesia     difficulty waking up from lithotripsy and nasal surgery  . Sleep apnea     Cannot use machine.   [name] COPD (chronic obstructive pulmonary disease) ([place])   . Bronchitis, chronic (HCC)     Surgeries: Procedure(s): CYSTOSCOPY, Left Retrograde Pyelogram, Left Ureteral Stent Placement on 08/07/2015   Consultants (if any): Treatment Team:  Louellen Molder, MD  Discharged Condition: Improved  Hospital Course: [name] [name] is an 50 y.o. male who was admitted 08/07/2015 with a diagnosis of Left ureteral stone and went to the operating room on 08/07/2015 and underwent the above named procedures.  He was noted to have AKI and hyperkalemia yesterday.  He was seen by the hospitalist and given Kayexelate.  His potassium has normalized and his Cr has improved.   He is afebrile and would like to be discharged.  He will have ureteroscopy a week from Thursday for stone removal.   He was given perioperative antibiotics:  Anti-infectives    Start     Dose/Rate Route Frequency Ordered Stop   08/09/15 0000  ciprofloxacin (CIPRO) 250 MG tablet     250 mg Oral 2 times daily 08/09/15 0830 08/19/15 2359   08/08/15 0900  ciprofloxacin (CIPRO) tablet 500 mg     500 mg Oral 2 times daily 08/08/15 0836     08/08/15 0600  ciprofloxacin (CIPRO) IVPB 400 mg  Status:  Discontinued     400 mg 200 mL/hr over 60 Minutes Intravenous Every 12 hours 08/07/15  2015 08/08/15 0836   08/07/15 1508  ciprofloxacin (CIPRO) IVPB 400 mg     400 mg 200 mL/hr over 60 Minutes Intravenous 60 min pre-op 08/07/15 1508 08/07/15 1751    .  He was given sequential compression devices and early ambulation for DVT prophylaxis.  He benefited maximally from the hospital stay and there were no complications.    Recent vital signs:  Filed Vitals:   08/08/15 2241 08/09/15 0616  BP: 137/92 145/85  Pulse: 82 73  Temp: 98.2 F (36.8 C) 98 F (36.7 C)  Resp: 16 16    Recent laboratory studies:  Lab Results  Component Value Date   HGB 14.8 08/08/2015   HGB 16.1 08/07/2015   HGB 16.8 09/20/2010   Lab Results  Component Value Date   WBC 11.3* 08/08/2015   PLT 242 08/08/2015   No results found for: INR Lab Results  Component Value Date   NA 143 08/09/2015   K 4.1 08/09/2015   CL 107 08/09/2015   CO2 27 08/09/2015   BUN 29* 08/09/2015   CREATININE 1.64* 08/09/2015   GLUCOSE 168* 08/09/2015    Discharge Medications:     Medication List    STOP taking these medications        amoxicillin-clavulanate 875-125 MG tablet  Commonly known as:  AUGMENTIN     meloxicam 15 MG tablet  Commonly known as:  MOBIC  tiZANidine 4 MG tablet  Commonly known as:  ZANAFLEX      TAKE these medications        acetaminophen 500 MG tablet  Commonly known as:  TYLENOL  Take 1,000 mg by mouth every 6 (six) hours as needed for moderate pain or headache.     albuterol 108 (90 Base) MCG/ACT inhaler  Commonly known as:  VENTOLIN HFA  Inhale 2 puffs into the lungs every 6 (six) hours as needed for wheezing or shortness of breath.     budesonide-formoterol 160-4.5 MCG/ACT inhaler  Commonly known as:  SYMBICORT  Inhale 2 puffs into the lungs 2 (two) times daily. Rinse mouth     ciprofloxacin 250 MG tablet  Commonly known as:  CIPRO  Take 1 tablet (250 mg total) by mouth 2 (two) times daily.     clorazepate 7.5 MG tablet  Commonly known as:  TRANXENE  TAKE  1 TABLET BY MOUTH TWICE A DAY AS NEEDED     Diclofenac Sodium 2 % Soln  Apply 1 pump twice daily     fluticasone 50 MCG/ACT nasal spray  Commonly known as:  FLONASE  Place 2 sprays into both nostrils daily.     gabapentin 100 MG capsule  Commonly known as:  NEURONTIN  Take 2 capsules (200 mg total) by mouth at bedtime.     HYDROcodone-acetaminophen 5-325 MG tablet  Commonly known as:  NORCO/VICODIN  Take 1 tablet by mouth every 4 (four) hours as needed for moderate pain.     ipratropium-albuterol 0.5-2.5 (3) MG/3ML Soln  Commonly known as:  DUONEB  1 neb 4 times daily as needed     ketorolac 10 MG tablet  Commonly known as:  TORADOL  Take 1 tablet (10 mg total) by mouth every 6 (six) hours as needed.     montelukast 10 MG tablet  Commonly known as:  SINGULAIR  Take 1 tablet by mouth  daily     nitroGLYCERIN 0.2 mg/hr patch  Commonly known as:  NITRODUR - Dosed in mg/24 hr  1/4 patch daily     predniSONE 5 MG tablet  Commonly known as:  DELTASONE  Take 10-15 mg by mouth daily as needed (asthma). Patient takes 10mg  daily every morning, and may take an extra 5mg  if needed for worsening asthma symptoms.     promethazine-codeine 6.25-10 MG/5ML syrup  Commonly known as:  PHENERGAN with CODEINE  Take 5 mLs by mouth every 6 (six) hours as needed for cough.     sodium chloride 0.65 % nasal spray  Commonly known as:  OCEAN  Place 1 spray into the nose daily as needed for congestion.     traMADol 50 MG tablet  Commonly known as:  ULTRAM  Take 1 tablet (50 mg total) by mouth every 8 (eight) hours as needed.     Turmeric Curcumin Caps  Take 1 capsule by mouth 2 (two) times daily.     Vitamin D3 1000 units Caps  Take 1 capsule by mouth 2 (two) times daily.        Diagnostic Studies: Dg Abd 1 View  08/04/2015  CLINICAL DATA:  Left lower back pain.  Gross hematuria. EXAM: ABDOMEN - 1 VIEW COMPARISON:  October 04, 2004. FINDINGS: The bowel gas pattern is normal. No definite  renal calculi are noted. Probable phleboliths are seen in the left pelvis. IMPRESSION: No evidence of bowel obstruction or ileus. No definite renal calculi are noted. Electronically Signed   By: Jeneen Rinks  Murlean Caller, M.D.   On: 08/04/2015 15:39    Disposition: 01-Home or Self Care        Follow-up Information    Follow up with Malka So, MD.   Specialty:  Urology   Why:  as scheduled for the next procedure.    Contact information:   [place] [id number] [phone]        Signed: Malka So 08/09/2015, 8:31 AM

## 2015-08-09 NOTE — Discharge Instructions (Signed)

## 2015-08-16 ENCOUNTER — Other Ambulatory Visit: Payer: Self-pay | Admitting: Urology

## 2015-08-17 ENCOUNTER — Ambulatory Visit (HOSPITAL_BASED_OUTPATIENT_CLINIC_OR_DEPARTMENT_OTHER)
Admission: RE | Admit: 2015-08-17 | Discharge: 2015-08-17 | Disposition: A | Discharge status: Home / Self Care | Payer: 59 | Source: Ambulatory Visit | Attending: Urology | Admitting: Urology

## 2015-08-17 ENCOUNTER — Encounter (HOSPITAL_BASED_OUTPATIENT_CLINIC_OR_DEPARTMENT_OTHER)
Admission: RE | Disposition: A | Discharge status: Home / Self Care | Payer: Self-pay | Source: Ambulatory Visit | Attending: Urology

## 2015-08-17 ENCOUNTER — Ambulatory Visit (HOSPITAL_BASED_OUTPATIENT_CLINIC_OR_DEPARTMENT_OTHER): Payer: 59 | Admitting: Registered Nurse

## 2015-08-17 ENCOUNTER — Encounter (HOSPITAL_BASED_OUTPATIENT_CLINIC_OR_DEPARTMENT_OTHER): Payer: Self-pay | Admitting: Anesthesiology

## 2015-08-17 DIAGNOSIS — G4733 Obstructive sleep apnea (adult) (pediatric): Secondary | ICD-10-CM | POA: Insufficient documentation

## 2015-08-17 DIAGNOSIS — Z7951 Long term (current) use of inhaled steroids: Secondary | ICD-10-CM | POA: Diagnosis not present

## 2015-08-17 DIAGNOSIS — N202 Calculus of kidney with calculus of ureter: Secondary | ICD-10-CM | POA: Diagnosis present

## 2015-08-17 DIAGNOSIS — N201 Calculus of ureter: Secondary | ICD-10-CM | POA: Diagnosis not present

## 2015-08-17 DIAGNOSIS — Z7952 Long term (current) use of systemic steroids: Secondary | ICD-10-CM | POA: Insufficient documentation

## 2015-08-17 DIAGNOSIS — Z79899 Other long term (current) drug therapy: Secondary | ICD-10-CM | POA: Insufficient documentation

## 2015-08-17 DIAGNOSIS — J45909 Unspecified asthma, uncomplicated: Secondary | ICD-10-CM | POA: Diagnosis not present

## 2015-08-17 DIAGNOSIS — Z791 Long term (current) use of non-steroidal anti-inflammatories (NSAID): Secondary | ICD-10-CM | POA: Insufficient documentation

## 2015-08-17 DIAGNOSIS — Z6838 Body mass index (BMI) 38.0-38.9, adult: Secondary | ICD-10-CM | POA: Diagnosis not present

## 2015-08-17 HISTORY — DX: Personal history of urinary calculi: Z87.442

## 2015-08-17 HISTORY — DX: Moderate persistent asthma, uncomplicated: J45.40

## 2015-08-17 HISTORY — PX: CYSTOSCOPY/URETEROSCOPY/HOLMIUM LASER/STENT PLACEMENT: SHX6546

## 2015-08-17 HISTORY — DX: Personal history of other diseases of the respiratory system: Z87.09

## 2015-08-17 HISTORY — DX: Calculus of ureter: N20.1

## 2015-08-17 HISTORY — DX: Obstructive sleep apnea (adult) (pediatric): G47.33

## 2015-08-17 HISTORY — PX: STONE EXTRACTION WITH BASKET: SHX5318

## 2015-08-17 HISTORY — DX: Allergic rhinitis, unspecified: J30.9

## 2015-08-17 LAB — POCT I-STAT 4, (NA,K, GLUC, HGB,HCT)
GLUCOSE: 97 mg/dL (ref 65–99)
HEMATOCRIT: 47 % (ref 39.0–52.0)
Hemoglobin: 16 g/dL (ref 13.0–17.0)
Potassium: 3.8 mmol/L (ref 3.5–5.1)
Sodium: 142 mmol/L (ref 135–145)

## 2015-08-17 SURGERY — CYSTOSCOPY/URETEROSCOPY/HOLMIUM LASER/STENT PLACEMENT
Anesthesia: General | Site: Renal | Laterality: Left

## 2015-08-17 MED ORDER — PROMETHAZINE HCL 25 MG/ML IJ SOLN
6.2500 mg | INTRAMUSCULAR | Status: DC | PRN
  Filled 2015-08-17: qty 1

## 2015-08-17 MED ORDER — MIDAZOLAM HCL 5 MG/5ML IJ SOLN
INTRAMUSCULAR | Status: DC | PRN
  Administered 2015-08-17: .25 mg via INTRAVENOUS

## 2015-08-17 MED ORDER — PHENAZOPYRIDINE HCL 200 MG PO TABS: 200.0000 mg | ORAL_TABLET | Freq: Three times a day (TID) | ORAL | Status: DC | PRN

## 2015-08-17 MED ORDER — HYDROCODONE-ACETAMINOPHEN 5-325 MG PO TABS
1.0000 | ORAL_TABLET | ORAL | Status: DC | PRN
Start: 2015-08-17 — End: 2015-09-14

## 2015-08-17 MED ORDER — DEXAMETHASONE SODIUM PHOSPHATE 10 MG/ML IJ SOLN
INTRAMUSCULAR | Status: AC
  Filled 2015-08-17: qty 1

## 2015-08-17 MED ORDER — ACETAMINOPHEN 325 MG PO TABS
650.0000 mg | ORAL_TABLET | ORAL | Status: DC | PRN
  Filled 2015-08-17: qty 2

## 2015-08-17 MED ORDER — LIDOCAINE HCL (CARDIAC) 20 MG/ML IV SOLN
INTRAVENOUS | Status: DC | PRN
  Administered 2015-08-17: 80 mg via INTRAVENOUS

## 2015-08-17 MED ORDER — ACETAMINOPHEN 10 MG/ML IV SOLN
INTRAVENOUS | Status: AC
  Filled 2015-08-17: qty 100

## 2015-08-17 MED ORDER — ONDANSETRON HCL 4 MG/2ML IJ SOLN
INTRAMUSCULAR | Status: AC
  Filled 2015-08-17: qty 2

## 2015-08-17 MED ORDER — PROPOFOL 10 MG/ML IV BOLUS
INTRAVENOUS | Status: AC
  Filled 2015-08-17: qty 40

## 2015-08-17 MED ORDER — DEXAMETHASONE SODIUM PHOSPHATE 4 MG/ML IJ SOLN
INTRAMUSCULAR | Status: DC | PRN
  Administered 2015-08-17: 10 mg via INTRAVENOUS

## 2015-08-17 MED ORDER — MIDAZOLAM HCL 2 MG/2ML IJ SOLN
INTRAMUSCULAR | Status: AC
  Filled 2015-08-17: qty 2

## 2015-08-17 MED ORDER — HYDROMORPHONE HCL 1 MG/ML IJ SOLN
0.2500 mg | INTRAMUSCULAR | Status: DC | PRN
  Filled 2015-08-17: qty 1

## 2015-08-17 MED ORDER — ACETAMINOPHEN 650 MG RE SUPP
650.0000 mg | RECTAL | Status: DC | PRN
  Filled 2015-08-17: qty 1

## 2015-08-17 MED ORDER — SODIUM CHLORIDE 0.9% FLUSH
3.0000 mL | Freq: Two times a day (BID) | INTRAVENOUS | Status: DC
  Filled 2015-08-17: qty 3

## 2015-08-17 MED ORDER — SODIUM CHLORIDE 0.9 % IR SOLN
Status: DC | PRN
  Administered 2015-08-17: 2000 mL

## 2015-08-17 MED ORDER — PHENAZOPYRIDINE HCL 100 MG PO TABS
ORAL_TABLET | ORAL | Status: AC
  Filled 2015-08-17: qty 2

## 2015-08-17 MED ORDER — SODIUM CHLORIDE 0.9 % IV SOLN
250.0000 mL | INTRAVENOUS | Status: DC | PRN
  Filled 2015-08-17: qty 250

## 2015-08-17 MED ORDER — PROPOFOL 10 MG/ML IV BOLUS
INTRAVENOUS | Status: DC | PRN
Start: 2015-08-17 — End: 2015-08-17
  Administered 2015-08-17: 300 mg via INTRAVENOUS

## 2015-08-17 MED ORDER — STERILE WATER FOR IRRIGATION IR SOLN
Status: DC | PRN
  Administered 2015-08-17: 500 mL

## 2015-08-17 MED ORDER — OXYCODONE HCL 5 MG PO TABS
5.0000 mg | ORAL_TABLET | ORAL | Status: DC | PRN
  Filled 2015-08-17: qty 2

## 2015-08-17 MED ORDER — FENTANYL CITRATE (PF) 100 MCG/2ML IJ SOLN
INTRAMUSCULAR | Status: AC
  Filled 2015-08-17: qty 2

## 2015-08-17 MED ORDER — FENTANYL CITRATE (PF) 100 MCG/2ML IJ SOLN
25.0000 ug | INTRAMUSCULAR | Status: DC | PRN
Start: 2015-08-17 — End: 2015-08-17
  Filled 2015-08-17: qty 1

## 2015-08-17 MED ORDER — LIDOCAINE HCL (CARDIAC) 20 MG/ML IV SOLN
INTRAVENOUS | Status: AC
  Filled 2015-08-17: qty 5

## 2015-08-17 MED ORDER — ACETAMINOPHEN 10 MG/ML IV SOLN
INTRAVENOUS | Status: DC | PRN
  Administered 2015-08-17: 1000 mg via INTRAVENOUS

## 2015-08-17 MED ORDER — ONDANSETRON HCL 4 MG/2ML IJ SOLN
INTRAMUSCULAR | Status: DC | PRN
  Administered 2015-08-17: 4 mg via INTRAVENOUS

## 2015-08-17 MED ORDER — FENTANYL CITRATE (PF) 100 MCG/2ML IJ SOLN
INTRAMUSCULAR | Status: DC | PRN
  Administered 2015-08-17 (×4): 25 ug via INTRAVENOUS

## 2015-08-17 MED ORDER — PROPOFOL 10 MG/ML IV BOLUS
INTRAVENOUS | Status: AC
  Filled 2015-08-17: qty 20

## 2015-08-17 MED ORDER — KETOROLAC TROMETHAMINE 30 MG/ML IJ SOLN
INTRAMUSCULAR | Status: AC
  Filled 2015-08-17: qty 1

## 2015-08-17 MED ORDER — LACTATED RINGERS IV SOLN
INTRAVENOUS | Status: DC
  Administered 2015-08-17 (×2): via INTRAVENOUS
  Filled 2015-08-17: qty 1000

## 2015-08-17 MED ORDER — CIPROFLOXACIN IN D5W 400 MG/200ML IV SOLN
INTRAVENOUS | Status: AC
  Filled 2015-08-17: qty 200

## 2015-08-17 MED ORDER — SODIUM CHLORIDE 0.9% FLUSH
3.0000 mL | INTRAVENOUS | Status: DC | PRN
  Filled 2015-08-17: qty 3

## 2015-08-17 MED ORDER — CIPROFLOXACIN IN D5W 400 MG/200ML IV SOLN
400.0000 mg | INTRAVENOUS | Status: AC
  Administered 2015-08-17: 400 mg via INTRAVENOUS
  Filled 2015-08-17: qty 200

## 2015-08-17 MED ORDER — KETOROLAC TROMETHAMINE 30 MG/ML IJ SOLN
INTRAMUSCULAR | Status: DC | PRN
  Administered 2015-08-17: 30 mg via INTRAVENOUS

## 2015-08-17 MED ORDER — PHENAZOPYRIDINE HCL 200 MG PO TABS
200.0000 mg | ORAL_TABLET | Freq: Three times a day (TID) | ORAL | Status: DC
  Administered 2015-08-17: 200 mg via ORAL
  Filled 2015-08-17: qty 1

## 2015-08-17 SURGICAL SUPPLY — 14 items
BAG DRAIN URO-CYSTO SKYTR STRL (DRAIN) ×2 IMPLANT
BAG DRN UROCATH (DRAIN) ×1
BASKET DAKOTA 1.9FR 11X120 (BASKET) ×1 IMPLANT
CLOTH BEACON ORANGE TIMEOUT ST (SAFETY) ×2 IMPLANT
FIBER LASER TRAC TIP (UROLOGICAL SUPPLIES) ×1 IMPLANT
GLOVE SURG SS PI 8.0 STRL IVOR (GLOVE) ×2 IMPLANT
GOWN STRL REUS W/ TWL LRG LVL3 (GOWN DISPOSABLE) ×1 IMPLANT
GOWN STRL REUS W/ TWL XL LVL3 (GOWN DISPOSABLE) ×1 IMPLANT
GOWN STRL REUS W/TWL LRG LVL3 (GOWN DISPOSABLE) ×2
GOWN STRL REUS W/TWL XL LVL3 (GOWN DISPOSABLE) ×2
GUIDEWIRE STR DUAL SENSOR (WIRE) ×3 IMPLANT
KIT ROOM TURNOVER WOR (KITS) ×2 IMPLANT
MANIFOLD NEPTUNE II (INSTRUMENTS) ×1 IMPLANT
PACK CYSTO (CUSTOM PROCEDURE TRAY) ×2 IMPLANT

## 2015-08-17 NOTE — Anesthesia Postprocedure Evaluation (Signed)
Anesthesia Post Note  Patient: ERINN MOONEN  Procedure(s) Performed: Procedure(s) (LRB): CYSTOSCOPY / LEFT URETEROSCOPY / HOLMIUM LASER LITHOTRIPSY (Left) STONE EXTRACTION WITH BASKET (Left)  Patient location during evaluation: PACU Anesthesia Type: General Level of consciousness: awake and alert Pain management: pain level controlled Vital Signs Assessment: post-procedure vital signs reviewed and stable Respiratory status: spontaneous breathing, nonlabored ventilation, respiratory function stable and patient connected to nasal cannula oxygen Cardiovascular status: blood pressure returned to baseline and stable Postop Assessment: no signs of nausea or vomiting Anesthetic complications: no    Last Vitals:  Filed Vitals:   08/17/15 0945 08/17/15 1000  BP: 140/89 122/73  Pulse: 84 87  Temp:    Resp: 16 19    Last Pain: There were no vitals filed for this visit.               Derek Huneycutt S

## 2015-08-17 NOTE — Brief Op Note (Signed)
08/17/2015  9:32 AM  PATIENT:  [name] [name]  50 y.o. male  PRE-OPERATIVE DIAGNOSIS:  LEFT URETERAL STONE  POST-OPERATIVE DIAGNOSIS:  LEFT URETERAL STONE  PROCEDURE:  Procedure(s): CYSTOSCOPY / LEFT URETEROSCOPY / HOLMIUM LASER LITHOTRIPSY (Left) STONE EXTRACTION WITH BASKET (Left)  SURGEON:  Surgeon(s) and Role:    * Irine Seal, MD - Primary  PHYSICIAN ASSISTANT:   ASSISTANTS: none   ANESTHESIA:   general  EBL:  Total I/O In: 200 [I.V.:200] Out: -   BLOOD ADMINISTERED:none  DRAINS: none   LOCAL MEDICATIONS USED:  NONE  SPECIMEN:  Source of Specimen:  stone fragments  DISPOSITION OF SPECIMEN:  to patient to bring to the office  COUNTS:  YES  TOURNIQUET:  * No tourniquets in log *  DICTATION: .Other Dictation: Dictation Number [id number]  PLAN OF CARE: Discharge to home after PACU  PATIENT DISPOSITION:  PACU - hemodynamically stable.   Delay start of Pharmacological VTE agent (>24hrs) due to surgical blood loss or risk of bleeding: not applicable

## 2015-08-17 NOTE — Anesthesia Preprocedure Evaluation (Addendum)
Anesthesia Evaluation  Patient identified by MRN, date of birth, ID band Patient awake    Reviewed: Allergy & Precautions, NPO status , Patient's Chart, lab work & pertinent test results  Airway Mallampati: II  TM Distance: >3 FB Neck ROM: Full    Dental no notable dental hx.    Pulmonary asthma , sleep apnea ,    Pulmonary exam normal breath sounds clear to auscultation       Cardiovascular negative cardio ROS Normal cardiovascular exam Rhythm:Regular Rate:Normal     Neuro/Psych negative neurological ROS  negative psych ROS   GI/Hepatic negative GI ROS, Neg liver ROS,   Endo/Other  Morbid obesity  Renal/GU negative Renal ROS  negative genitourinary   Musculoskeletal negative musculoskeletal ROS (+)   Abdominal   Peds negative pediatric ROS (+)  Hematology negative hematology ROS (+)   Anesthesia Other Findings   Reproductive/Obstetrics negative OB ROS                             Anesthesia Physical Anesthesia Plan  ASA: III  Anesthesia Plan: General   Post-op Pain Management:    Induction: Intravenous  Airway Management Planned: LMA  Additional Equipment:   Intra-op Plan:   Post-operative Plan: Extubation in OR  Informed Consent: I have reviewed the patients History and Physical, chart, labs and discussed the procedure including the risks, benefits and alternatives for the proposed anesthesia with the patient or authorized representative who has indicated his/her understanding and acceptance.   Dental advisory given  Plan Discussed with: CRNA and Surgeon  Anesthesia Plan Comments: (No VERSED)       Anesthesia Quick Evaluation

## 2015-08-17 NOTE — Transfer of Care (Signed)
Immediate Anesthesia Transfer of Care Note  Patient: [name] [name]  Procedure(s) Performed: Procedure(s) (LRB): CYSTOSCOPY / LEFT URETEROSCOPY / HOLMIUM LASER LITHOTRIPSY (Left) STONE EXTRACTION WITH BASKET (Left)  Patient Location: PACU  Anesthesia Type: General  Level of Consciousness: awake, sedated, patient cooperative and responds to stimulation  Airway & Oxygen Therapy: Patient Spontanous Breathing and Patient connected to face mask oxygen  Post-op Assessment: Report given to PACU RN, Post -op Vital signs reviewed and stable and Patient moving all extremities  Post vital signs: Reviewed and stable  Complications: No apparent anesthesia complications

## 2015-08-17 NOTE — Discharge Instructions (Addendum)
CYSTOSCOPY HOME CARE INSTRUCTIONS  Activity: Rest for the remainder of the day.  Do not drive or operate equipment today.  You may resume normal activities in one to two days as instructed by your physician.   Meals: Drink plenty of liquids and eat light foods such as gelatin or soup this evening.  You may return to a normal meal plan tomorrow.  Return to Work: You may return to work in one to two days or as instructed by your physician.  Special Instructions / Symptoms: Call your physician if any of these symptoms occur:   -persistent or heavy bleeding  -bleeding which continues after first few urination  -large blood clots that are difficult to pass  -urine stream diminishes or stops completely  -fever equal to or higher than 101 degrees Farenheit.  -cloudy urine with a strong, foul odor  -severe pain  Females should always wipe from front to back after elimination.  You may feel some burning pain when you urinate.  This should disappear with time.  Applying moist heat to the lower abdomen or a hot tub bath may help relieve the pain. \  Bring your stone fragments to your f/u appointment.   Patient Signature:  ________________________________________________________  Nurse's Signature:  ________________________________________________________   Post Anesthesia Home Care Instructions  Activity: Get plenty of rest for the remainder of the day. A responsible adult should stay with you for 24 hours following the procedure.  For the next 24 hours, DO NOT: -Drive a car -Paediatric nurse -Drink alcoholic beverages -Take any medication unless instructed by your physician -Make any legal decisions or sign important papers.  Meals: Start with liquid foods such as gelatin or soup. Progress to regular foods as tolerated. Avoid greasy, spicy, heavy foods. If nausea and/or vomiting occur, drink only clear liquids until the nausea and/or vomiting subsides. Call your physician if  vomiting continues.  Special Instructions/Symptoms: Your throat may feel dry or sore from the anesthesia or the breathing tube placed in your throat during surgery. If this causes discomfort, gargle with warm salt water. The discomfort should disappear within 24 hours.  If you had a scopolamine patch placed behind your ear for the management of post- operative nausea and/or vomiting:  1. The medication in the patch is effective for 72 hours, after which it should be removed.  Wrap patch in a tissue and discard in the trash. Wash hands thoroughly with soap and water. 2. You may remove the patch earlier than 72 hours if you experience unpleasant side effects which may include dry mouth, dizziness or visual disturbances. 3. Avoid touching the patch. Wash your hands with soap and water after contact with the patch.

## 2015-08-17 NOTE — Interval H&P Note (Signed)
History and Physical Interval Note:  Mr. [name] [name] had stenting for the left mid ureteral stone and returns today for definitive therapy.  He got relief from the stent.   08/17/2015 8:48 AM  [name] [name]  has presented today for surgery, with the diagnosis of LEFT URETERAL STONE  The various methods of treatment have been discussed with the patient and family. After consideration of risks, benefits and other options for treatment, the patient has consented to  Procedure(s): LEFT URETEROSCOPY/HOLMIUM LASER/STENT PLACEMENT (Left) as a surgical intervention .  The patient's history has been reviewed, patient examined, no change in status, stable for surgery.  I have reviewed the patient's chart and labs.  Questions were answered to the patient's satisfaction.     Preet Mangano J
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
## 2015-08-17 NOTE — Anesthesia Procedure Notes (Signed)
Procedure Name: LMA Insertion Date/Time: 08/17/2015 9:14 AM Performed by: Justice Rocher Pre-anesthesia Checklist: Patient identified, Emergency Drugs available, Suction available and Patient being monitored Patient Re-evaluated:Patient Re-evaluated prior to inductionOxygen Delivery Method: Circle System Utilized Preoxygenation: Pre-oxygenation with 100% oxygen Intubation Type: IV induction Ventilation: Mask ventilation without difficulty LMA: LMA inserted LMA Size: 5.0 Number of attempts: 1 Airway Equipment and Method: Bite block Placement Confirmation: positive ETCO2 Tube secured with: Tape Dental Injury: Teeth and Oropharynx as per pre-operative assessment

## 2015-08-18 ENCOUNTER — Encounter (HOSPITAL_BASED_OUTPATIENT_CLINIC_OR_DEPARTMENT_OTHER): Payer: Self-pay | Admitting: Urology

## 2015-08-18 NOTE — Op Note (Signed)
[name] [name], [name] [name] NO.:  [id number]  MEDICAL RECORD NO.:  [id number]  LOCATION:                                 FACILITY:  PHYSICIAN:  Marshall Cork. Jeffie Pollock, M.D.    DATE OF BIRTH:  1965/08/25  DATE OF PROCEDURE:  08/17/2015 DATE OF DISCHARGE:                              OPERATIVE REPORT   PROCEDURE:  Cystoscopy with removal of left double-J stent, left ureteroscopic stone extraction with holmium lasertripsy.  PREOPERATIVE DIAGNOSIS:  Left mid ureteral stone.  POSTOPERATIVE DIAGNOSIS:  Left mid ureteral stone.  SURGEON:  Marshall Cork. Jeffie Pollock, M.D.  ANESTHESIA:  General.  SPECIMEN:  Stone fragments.  DRAIN:  None.  BLOOD LOSS:  None.  COMPLICATIONS:  None.  INDICATIONS:  [name] is a 50 year old white male with a history of an 8-mm mid ureteral stone on the left.  He had previously undergone stenting.  He was found to have possible pyuria.  He returns now for definitive stone management.  FINDINGS OF PROCEDURE:  He was given Cipro.  He was taken to the operating room where general anesthetic was induced.  He was placed in lithotomy position.  His perineum and genitalia were prepped with Betadine solution.  He was draped in usual sterile fashion.  Cystoscopy was performed using the 23-French scope and 30-degree lens. Examination revealed normal urethra.  The external sphincter was intact. 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  Examination of bladder revealed normal mucosa with mild trabeculation.  Right ureteral orifice was normal.  Left ureteral orifice had a stent coil at the meatus with some surrounding edema.  The stent was grasped with a grasping forceps, pulled the urethral meatus, and a Sensor guidewire was passed through the kidney.  Under fluoroscopic guidance, the stent was removed.  The 6.5-French semi-rigid ureteroscope was then inserted alongside the wire.  I was unable to negotiate it over the iliac vessels despite  using another Sensor wire as a filiforme.  At this point, the 8-French single-lumen digital flexible ureteroscope was passed per urethra and up to ureteral orifice.  I was able to get to the level of stone just above the iliacs.  The stone was engaged with a 220-micron TracTip laser fiber with initial settings to 0.5 watts and 20 hertz.  We then eventually increased to 0.8 watts and 20 hertz.  The stone fragmented readily at that power.  The fragments were then removed with a [place] basket.  Once all the fragments were removed, the ureteroscope was passed through the kidney, and the collecting system was inspected.  No additional stones were identified.  The scope was removed with no significant stones remaining in the ureter.  At this point, it was felt that a stent was not indicated.  So the guidewire was removed.  The cystoscope was reinserted, and the stone fragments were flushed from the bladder.  The cystoscope was then removed.  The patient was taken down from lithotomy position.  His anesthetic was reversed. He was moved to recovery room in stable condition.  There were no complications.     Marshall Cork. Jeffie Pollock, M.D.     JJW/MEDQ  D:  08/17/2015  T:  08/17/2015  Job:  [id number]

## 2015-08-29 ENCOUNTER — Other Ambulatory Visit: Payer: Self-pay | Admitting: Internal Medicine

## 2015-09-07 ENCOUNTER — Other Ambulatory Visit: Payer: Self-pay | Admitting: Internal Medicine

## 2015-09-14 ENCOUNTER — Encounter: Payer: Self-pay | Admitting: Internal Medicine

## 2015-09-14 ENCOUNTER — Ambulatory Visit (INDEPENDENT_AMBULATORY_CARE_PROVIDER_SITE_OTHER): Payer: 59 | Admitting: Internal Medicine

## 2015-09-14 VITALS — BP 146/82 | HR 95 | Ht 70.0 in | Wt 282.4 lb

## 2015-09-14 DIAGNOSIS — G4733 Obstructive sleep apnea (adult) (pediatric): Secondary | ICD-10-CM | POA: Diagnosis not present

## 2015-09-14 DIAGNOSIS — J320 Chronic maxillary sinusitis: Secondary | ICD-10-CM

## 2015-09-14 MED ORDER — CLORAZEPATE DIPOTASSIUM 7.5 MG PO TABS: ORAL_TABLET | ORAL | Status: DC

## 2015-09-14 MED ORDER — MONTELUKAST SODIUM 10 MG PO TABS: ORAL_TABLET | ORAL | Status: DC

## 2015-09-14 MED ORDER — FLUTICASONE PROPIONATE 50 MCG/ACT NA SUSP: 2.0000 | Freq: Every day | NASAL | Status: DC

## 2015-09-14 MED ORDER — FLUCONAZOLE 100 MG PO TABS: 100.0000 mg | ORAL_TABLET | Freq: Every day | ORAL | Status: DC | PRN

## 2015-09-14 MED ORDER — PREDNISONE 5 MG PO TABS: ORAL_TABLET | ORAL | Status: DC

## 2015-09-14 MED ORDER — BUDESONIDE-FORMOTEROL FUMARATE 160-4.5 MCG/ACT IN AERO: 2.0000 | INHALATION_SPRAY | Freq: Two times a day (BID) | RESPIRATORY_TRACT | Status: DC

## 2015-09-14 MED ORDER — ALBUTEROL SULFATE HFA 108 (90 BASE) MCG/ACT IN AERS: 2.0000 | INHALATION_SPRAY | Freq: Four times a day (QID) | RESPIRATORY_TRACT | Status: DC | PRN

## 2015-09-14 NOTE — Assessment & Plan Note (Signed)
Untreated for now since he turned in CPAP and chose not to see Dr. Ron Parker for oral appliance. Plan-emphasized importance of weight loss and conservative measures

## 2015-09-14 NOTE — Assessment & Plan Note (Signed)
Not sure how active this problem is now. Plan-gradual reduction of prednisone with careful discussion of adrenal insufficiency issues and prednisone side effects. Discussed off label report of verapamil therapy for this problem. Plan-reduce prednisone by 2.5 mg per week toward goal of 5 mg daily

## 2015-09-14 NOTE — Patient Instructions (Addendum)
Suggest try reducing prednisone by 2.5 mg/ 1/2 tab to 12.5 mg daily x 7 days.  Then reduce down to 10 mg daily and hold at that for the next month. Eventually we would like to get down to 5 mg daily maintenance. Don't drop faster than 2.5 mg per week.   We considered the possibility of trying verapamil for the nasal inflammation, based on the one study I told you about.  Refills printed

## 2015-09-14 NOTE — Progress Notes (Signed)
Patient ID: [name] [name], male    DOB: [birth year], 50 y.o.   MRN: [id number]  HPI 80 yoM followed for hx of asthma, rhinitis and chronic sinusitis. Last here December 26, 2009. Since then he had extensive sinus surgery and polypectomy by Dr Erik Obey 03/24/78, which did help nasal congestion. They noted the pollen issues this Spring. Slow prednisone taper ended 2 weeks ago. Off that he again got congested in chest, wheezing, short of breath interfering with sleep. We called short prednisone taper which has helped, but not enough. He continues Symbicort, Flonase, Singulair. Dr Erik Obey treated for thrush. Denies diabetes. He notes weight gain on prednisone, Skin test positive and on allergy vaccine in the past. 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  12/18/10- Asthma, rhinitis, chronic sinusitis s/p sinus surgery/ polypectomy.  We restarted prednisone 5 mg and he is using 1-3 daily. Feels "no better no worse". Duleara 200  is the same as Symbicort 160. Nasonex works better than Fluticasone. He and his wife discussed Xolair and decided not to risk it for fear of side effects. He continues to work in [employer], but says lung disease is not thought to be associated with lung disease. Nasal airway now seems great, 3 months after surgery. Still wakes once/ night to cough up white mucus. Can't breathe w/o prednisone. Feels better outdoors.   03/18/11-  Asthma, rhinitis, chronic sinusitis s/p sinus surgery/ polypectomy.  Cough remains productive, with much postnasal drip. Continue neti pot. Denies aspiration/ choke while eating. Some off and on wheeze.  Insurance wouldn't pay for [employer]. He associated back pain with Dulera, relieved by returning to Symbicort Uses nebulizer 3 x most days. Denies glaucoma or prostatism> Discussed Spiriva as a drying option to try. Since nasal surgery by Dr Erik Obey he drains more. Nasonex better than  fluticasone. Not using an antihistamine.  Prednisone 2-3 x 5 mg daily; tries to skip it but never can. We talked again about trying Xolair- he says his wife was frightened by reported side effects. 57 yoM followed for hx of asthma, rhinitis and chronic sinusitis.  06/19/11- 67 yoM never smoker followed for hx of asthma, rhinitis and chronic sinusitis. After a very difficult half year, he says he finally feels that he is at least more stable. There is extra stress from dealing with his sick and elderly father. Tranxene has been a big help with the stress. Since he is doing less "stress eating" he has lost 10 pounds. He likes Spiriva and uses his nebulizer twice a day. We have continued him taking daily maintenance prednisone between 10 and 15 mg daily. This has caused thrush. Since the sinus surgery, he still has no sinus infection but continues using his sinus rinse. Cough continues to be productive with scant yellow. Often he can't get it to come out and will wheeze. He denies fever, sweat, sore throat, swollen glands, blood.  10/17/11- 57 yoM never smoker followed for hx of asthma, rhinitis and chronic sinusitis. Acute bronchitis exacerbation slowly resolving over 11 weeks. Hoarseness is slowly getting better. Still doing saline nasal rinse every day. Cough is always productive of mucus which is light yellow to green. Now off of antibiotics. Still taking prednisone 15 mg daily-can't get lower. Continues Symbicort.  01/21/12- 18 yoM never smoker followed for hx of asthma, rhinitis and chronic sinusitis. Can tell a big difference with taking Azithromycin once daily; not using rescue inhaler as  often now. Son has graduated from high school, removing a significant source of stress in the family. Hoarseness comes and goes, because of which he can't sing. We discussed thrush, reflux and postnasal drainage as possible contributors. He cannot get lower than 15 mg prednisone daily. Much less use of rescue  inhaler as noted. He decided side effect risk was too great to try Xolair shots. He does use AeroChamber with Symbicort. Has cut down some on Spiriva but uses nebulizer twice a day.  07/28/12- 55 yoM never smoker followed for hx of asthma, rhinitis and chronic sinusitis. FOLLOWS FOR: sick in November and had to get RX; sickness keeps going around in family; slight wheezing and SOB since. A viral syndrome respiratory infection was passing around in his family in November. We had called in Augmentin which helped. He was well until Christmas when he had another flare. Persistent cough with scant yellow sputum, no fever or blood. We had tried leaving him on maintenance Zithromax. Using 1 puff Symbicort one time daily. Continues tranxene. No longer having sinus infections since the sinus surgery but continues Flonase and uses a mask for yard work. Continues prednisone 15 mg daily; 10 mg was not enough to keep him stable.  01/27/13- 39 yoM never smoker followed for hx of asthma, rhinitis and chronic sinusitis. FOLLOWS FOR: pt reports he has had some "rough days" throughout the winter, but since warmer weather breathing is doing better; feels like he is at a baseline Weather limiting wants to work outside especially in the winter. Wears a mask to mow. Tried Cefdinir- no help. Likes azithromycin used in intervals when needed. We discussed anti-inflammatory effect. Daily nebulizer 2 or 3 times/DuoNeb. Continues other meds as discussed. CXR 07/30/12 IMPRESSION:  No edema or consolidation.  Original Report Authenticated By: Lowella Grip, M.D.  08/03/13-47 yoM never smoker followed for hx of asthma, rhinitis and chronic sinusitis.  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 Unusual exertion in cold air triggered asthma. Now on work days he takes 15 mg prednisone, on days off he takes 10 mg. Using nebulizer machine  about 3 times daily.  11/02/13- 47 yoM never smoker followed for hx of asthma, rhinitis and chronic sinusitis. FOLLOWS FOR: Pt states after using Breo he got sick with URI(thinks he caught from other people-?medication causing some side effects). Was given 2 abx since last visit to help with this. Since completing the meds he feels good and wants to know if he should go back on Breo. Has not used Breo or Spriva since Jan 2015(Muscle pains went away since stopping Spriva). Continues to use Symbicort("can't live without it").  Using nebulizer at least once daily. Feels controlled now. Minor sneezing from pollen. Continues maintenance prednisone 15 mg daily and maintenance Zithromax for chronic bronchitis depression. Uses mask to mow or do yard work. 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  02/03/14- 47 yoM never smoker followed for hx of chronic obstructive asthma, rhinitis and chronic sinusitis. FOLLOWS FOR: patient states he has not felt well since hot weather has set in.  Pt stopped Zithromax and feels like he getting sick again. Did well after last flare in April. Now blames hot weather on easy weakness and dyspnea with exertion outdoors. Not much cough or wheeze. After 2 months off of maintenance Zithromax he just restarted. Cough is chronic and productive of white to yellow nonbloody sputum. Maintenance  prednisone 15 mg daily. Using his nebulizer machine 3-4 times daily. Office Spirometry 02/03/2014-severe obstructive airways disease. FVC 3.13/62%, FEV1 2.01/50%, FEV1/FVC 0.64, FEF 25-75% 0.87/22%.  07/27/14- 78 yoM never smoker followed for hx of chronic obstructive asthma, rhinitis and chronic sinusitis. Maintenance prednisone Stopped maintenance zithromax- use for acute infections FOLLOWS FOR: Pt reports having cough in November-used Zithromax to treat upper respiratory issues. Pt states that he is still having cough(decreased). Pt reports using nebulizer and  albuterol hfa more frequent.  Exacerbation of bronchitis with increased cough. Not using Spiriva and has been using Symbicort only once daily because of cost. Medications reviewed Rhinitis much improved and he has sense of smell back. CXR 02/03/14 IMPRESSION: Negative exam. Electronically Signed  By: Inge Rise M.D.  On: 02/03/2014 14:00  01/26/15- 35 yoM never smoker followed for hx of chronic obstructive asthma, rhinitis and chronic sinusitis. Maintenance prednisone 15 mg daily Follows for: c/o prod cough with dark brown. Treated for torn tendons and muscles by Dr. Ortencia Kick.  Some cough and wheeze most days. Discussed use of Symbicort. Rescue inhaler at least once on most days. Wife concerned he may have obstructive sleep apnea because of witnessed snore CXR 12/14/14 IMPRESSION: No active cardiopulmonary disease. Electronically Signed  By: Lahoma Crocker M.D.  On: 12/14/2014 11:47  05/17/15- 40 yoM never smoker followed for hx of chronic obstructive asthma, rhinitis and chronic sinusitis. Maintenance prednisone 15 mg daily FOLLOWS FOR:Pt has had hard time with sleep due to back injury as well;has not started using CPAP machine-tough getting use to it. Pt would like to discuss Meloxicam usagefor his breathing-states he could tell a difference(breathing little easier) Rx was given by Dr smith-sports medicine MD on first floor. Unattended Hpme Sleep Test 02/09/15- mild OSA/ AHI 9.3/ hr, desat to 85%, weight 276 lbs CPAP autotitration [employer] He blames a series of back and shoulder injuries making it hard to use CPAP. Uncomfortable rolling around, pulling on hose etc. We discussed oral appliance therapy as an alternative. Weight loss remains at goal. Breathing has been much better controlled without acute exacerbation on stable medications.  09/14/2015-50 year old male never smoker followed for chronic obstructive asthma, OSA, rhinitis/chronic sinusitis CPAP auto  5-15/[facility]- patient [place] FOR:Pt took CPAP machine back to [place] and states he never went to see Dr Ron Parker about oral appliance.  Prednisone maintenance-15 mg daily He has not been very active since job-related injury some time ago. This and steroids cause weight gain. We discussed steroid side effects and need to push for lower dosage. He feels well controlled as long as he sticks to his routine with nebulizer and Symbicort, Flonase.. Not much awareness of rhinitis since ENT surgery. We discussed possibility of a trial of verapamil, off label, based on one report that it seemed to reduce an inflammatory protein from nasal mucosa and chronic rhinitis. We are going to try slow prednisone reduction to see what he really needs for symptom control.  Review of Systems-See HPI Constitutional:    No- night sweats ,fevers, chills, fatigue, lassitude. HEENT:   No - headaches,  Difficulty swallowing, Tooth/dental problems, Sore throat,                No sneezing, itching, ear ache, CV:  No chest pain,  Orthopnea, PND, swelling in lower extremities, anasarca, dizziness, palpitations GI  No heartburn, indigestion, abdominal pain, nausea, vomiting,  Resp:.See HPI   +Some productive cough   No coughing up of blood.  No-change in color of  mucus,                + wheeze Skin: no rash or lesions. GU: . MS:  No joint pain or swelling.  [name] Psych:   change in mood or affect.  depression and  anxiety.  No memory loss.  Today Objective:   Physical Exam General- Alert, Oriented, Affect-appropriate, Distress- none acute, + overweight/thick neck Skin- rash-none, lesions- none, excoriation- none Lymphadenopathy- none Head- atraumatic            Eyes- Gross vision intact, PERRLA, conjunctivae clear secretions            Ears- Hearing, canals normal            Nose- Clear, no-Septal dev, mucus, polyps, erosion, perforation             Throat- Mallampati  III- IV , mucosa clear ,  drainage- none, tonsils- atrophic. , Neck- flexible , trachea midline, no stridor , thyroid nl, carotid no bruit Chest - symmetrical excursion , unlabored           Heart/CV- RRR , no murmur , no gallop  , no rub, nl s1 s2                           - JVD- none , edema- none, stasis changes- none, varices- none           Lung-  Wheeze-none, + few crackles right lung, unlabored,   Cough-none , dullness-none, rub- none           Chest wall-  Abd-  Br/ Gen/ Rectal- Not done, not indicated Extrem- cyanosis- none, clubbing, none, atrophy- none, strength- nl Neuro- grossly intact to observation

## 2015-10-23 ENCOUNTER — Telehealth: Payer: Self-pay | Admitting: Internal Medicine

## 2015-10-23 MED ORDER — AMOXICILLIN-POT CLAVULANATE 875-125 MG PO TABS: 1.0000 | ORAL_TABLET | Freq: Two times a day (BID) | ORAL | Status: DC

## 2015-10-23 NOTE — Telephone Encounter (Signed)
Ok augmentin 875 mg, # 14, 1 twice daily, refill x 1

## 2015-10-23 NOTE — Telephone Encounter (Signed)
Called and spoke with pt. Informed him of CY's recs and verified pharmacy as [facility] [address]. Pt voiced understanding and had no further questions. Rx sent. Nothing further needed.

## 2015-10-23 NOTE — Telephone Encounter (Signed)
Spoke with pt and he c/o cough with yellow/green mucus, some chest tightness and congestion for about 1 week. Pt denies f/n/v. Pt states that Augmentin has worked well in the past and is requesting this.  [facility] [address] [place]  [place] Please advise. Thanks.  LOV  09/14/15 Next OV  12/28/15  Allergies  Allergen Reactions  . Fluticasone-Salmeterol Other (See Comments)    REACTION: jaw joint pain   Current Outpatient Prescriptions on File Prior to Visit  Medication Sig Dispense Refill  . albuterol (VENTOLIN HFA) 108 (90 Base) MCG/ACT inhaler Inhale 2 puffs into the lungs every 6 (six) hours as needed for wheezing or shortness of breath. 3 Inhaler 3  . budesonide-formoterol (SYMBICORT) 160-4.5 MCG/ACT inhaler Inhale 2 puffs into the lungs 2 (two) times daily. Rinse mouth 3 Inhaler 3  . clorazepate (TRANXENE) 7.5 MG tablet TAKE 1 TABLET BY MOUTH TWICE A DAY AS NEEDED 180 tablet 3  . fluconazole (DIFLUCAN) 100 MG tablet Take 1 tablet (100 mg total) by mouth daily as needed. 7 tablet 12  . fluticasone (FLONASE) 50 MCG/ACT nasal spray Place 2 sprays into both nostrils daily. 48 g 3  . ipratropium-albuterol (DUONEB) 0.5-2.5 (3) MG/3ML SOLN 1 neb 4 times daily as needed (Patient taking differently: Inhale 3 mLs into the lungs every 6 (six) hours as needed (shortness of breath/wheezing). 1 neb 4 times daily as needed---  PER PT IS USING DAILY AM AND PRN PM) 1080 mL 3  . montelukast (SINGULAIR) 10 MG tablet 1 daily 90 tablet 3  . predniSONE (DELTASONE) 5 MG tablet # daily, try to taper as directed 270 tablet 0   No current facility-administered medications on file prior to visit.

## 2015-11-16 ENCOUNTER — Other Ambulatory Visit: Payer: Self-pay | Admitting: Internal Medicine

## 2015-12-28 ENCOUNTER — Other Ambulatory Visit (INDEPENDENT_AMBULATORY_CARE_PROVIDER_SITE_OTHER): Payer: 59

## 2015-12-28 ENCOUNTER — Encounter: Payer: Self-pay | Admitting: Internal Medicine

## 2015-12-28 ENCOUNTER — Ambulatory Visit (INDEPENDENT_AMBULATORY_CARE_PROVIDER_SITE_OTHER): Payer: 59 | Admitting: Internal Medicine

## 2015-12-28 VITALS — BP 126/76 | HR 87 | Ht 70.0 in | Wt 277.4 lb

## 2015-12-28 DIAGNOSIS — J4540 Moderate persistent asthma, uncomplicated: Secondary | ICD-10-CM

## 2015-12-28 DIAGNOSIS — J320 Chronic maxillary sinusitis: Secondary | ICD-10-CM

## 2015-12-28 LAB — CBC WITH DIFFERENTIAL/PLATELET
BASOS PCT: 0.5 % (ref 0.0–3.0)
Basophils Absolute: 0.1 10*3/uL (ref 0.0–0.1)
EOS ABS: 0.8 10*3/uL — AB (ref 0.0–0.7)
Eosinophils Relative: 7.3 % — ABNORMAL HIGH (ref 0.0–5.0)
HEMATOCRIT: 47 % (ref 39.0–52.0)
Hemoglobin: 15.5 g/dL (ref 13.0–17.0)
LYMPHS ABS: 1.5 10*3/uL (ref 0.7–4.0)
Lymphocytes Relative: 14.2 % (ref 12.0–46.0)
MCHC: 33 g/dL (ref 30.0–36.0)
MCV: 90.1 fl (ref 78.0–100.0)
MONO ABS: 0.8 10*3/uL (ref 0.1–1.0)
Monocytes Relative: 7.4 % (ref 3.0–12.0)
NEUTROS ABS: 7.4 10*3/uL (ref 1.4–7.7)
Neutrophils Relative %: 70.6 % (ref 43.0–77.0)
Platelets: 256 10*3/uL (ref 150.0–400.0)
RBC: 5.22 Mil/uL (ref 4.22–5.81)
RDW: 14.4 % (ref 11.5–15.5)
WBC: 10.5 10*3/uL (ref 4.0–10.5)

## 2015-12-28 NOTE — Progress Notes (Signed)
Patient ID: [name] [name], male    DOB: [birth year], 50 y.o.   MRN: [id number]  HPI 80 yoM followed for hx of asthma, rhinitis and chronic sinusitis. Last here December 26, 2009. Since then he had extensive sinus surgery and polypectomy by Dr Erik Obey 03/24/78, which did help nasal congestion. They noted the pollen issues this Spring. Slow prednisone taper ended 2 weeks ago. Off that he again got congested in chest, wheezing, short of breath interfering with sleep. We called short prednisone taper which has helped, but not enough. He continues Symbicort, Flonase, Singulair. Dr Erik Obey treated for thrush. Denies diabetes. He notes weight gain on prednisone, Skin test positive and on allergy vaccine in the past. 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  12/18/10- Asthma, rhinitis, chronic sinusitis s/p sinus surgery/ polypectomy.  We restarted prednisone 5 mg and he is using 1-3 daily. Feels "no better no worse". Duleara 200  is the same as Symbicort 160. Nasonex works better than Fluticasone. He and his wife discussed Xolair and decided not to risk it for fear of side effects. He continues to work in [employer], but says lung disease is not thought to be associated with lung disease. Nasal airway now seems great, 3 months after surgery. Still wakes once/ night to cough up white mucus. Can't breathe w/o prednisone. Feels better outdoors.   03/18/11-  Asthma, rhinitis, chronic sinusitis s/p sinus surgery/ polypectomy.  Cough remains productive, with much postnasal drip. Continue neti pot. Denies aspiration/ choke while eating. Some off and on wheeze.  Insurance wouldn't pay for [employer]. He associated back pain with Dulera, relieved by returning to Symbicort Uses nebulizer 3 x most days. Denies glaucoma or prostatism> Discussed Spiriva as a drying option to try. Since nasal surgery by Dr Erik Obey he drains more. Nasonex better than  fluticasone. Not using an antihistamine.  Prednisone 2-3 x 5 mg daily; tries to skip it but never can. We talked again about trying Xolair- he says his wife was frightened by reported side effects. 57 yoM followed for hx of asthma, rhinitis and chronic sinusitis.  06/19/11- 67 yoM never smoker followed for hx of asthma, rhinitis and chronic sinusitis. After a very difficult half year, he says he finally feels that he is at least more stable. There is extra stress from dealing with his sick and elderly father. Tranxene has been a big help with the stress. Since he is doing less "stress eating" he has lost 10 pounds. He likes Spiriva and uses his nebulizer twice a day. We have continued him taking daily maintenance prednisone between 10 and 15 mg daily. This has caused thrush. Since the sinus surgery, he still has no sinus infection but continues using his sinus rinse. Cough continues to be productive with scant yellow. Often he can't get it to come out and will wheeze. He denies fever, sweat, sore throat, swollen glands, blood.  10/17/11- 57 yoM never smoker followed for hx of asthma, rhinitis and chronic sinusitis. Acute bronchitis exacerbation slowly resolving over 11 weeks. Hoarseness is slowly getting better. Still doing saline nasal rinse every day. Cough is always productive of mucus which is light yellow to green. Now off of antibiotics. Still taking prednisone 15 mg daily-can't get lower. Continues Symbicort.  01/21/12- 18 yoM never smoker followed for hx of asthma, rhinitis and chronic sinusitis. Can tell a big difference with taking Azithromycin once daily; not using rescue inhaler as  often now. Son has graduated from high school, removing a significant source of stress in the family. Hoarseness comes and goes, because of which he can't sing. We discussed thrush, reflux and postnasal drainage as possible contributors. He cannot get lower than 15 mg prednisone daily. Much less use of rescue  inhaler as noted. He decided side effect risk was too great to try Xolair shots. He does use AeroChamber with Symbicort. Has cut down some on Spiriva but uses nebulizer twice a day.  07/28/12- 55 yoM never smoker followed for hx of asthma, rhinitis and chronic sinusitis. FOLLOWS FOR: sick in November and had to get RX; sickness keeps going around in family; slight wheezing and SOB since. A viral syndrome respiratory infection was passing around in his family in November. We had called in Augmentin which helped. He was well until Christmas when he had another flare. Persistent cough with scant yellow sputum, no fever or blood. We had tried leaving him on maintenance Zithromax. Using 1 puff Symbicort one time daily. Continues tranxene. No longer having sinus infections since the sinus surgery but continues Flonase and uses a mask for yard work. Continues prednisone 15 mg daily; 10 mg was not enough to keep him stable.  01/27/13- 39 yoM never smoker followed for hx of asthma, rhinitis and chronic sinusitis. FOLLOWS FOR: pt reports he has had some "rough days" throughout the winter, but since warmer weather breathing is doing better; feels like he is at a baseline Weather limiting wants to work outside especially in the winter. Wears a mask to mow. Tried Cefdinir- no help. Likes azithromycin used in intervals when needed. We discussed anti-inflammatory effect. Daily nebulizer 2 or 3 times/DuoNeb. Continues other meds as discussed. CXR 07/30/12 IMPRESSION:  No edema or consolidation.  Original Report Authenticated By: Lowella Grip, M.D.  08/03/13-47 yoM never smoker followed for hx of asthma, rhinitis and chronic sinusitis.  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 Unusual exertion in cold air triggered asthma. Now on work days he takes 15 mg prednisone, on days off he takes 10 mg. Using nebulizer machine  about 3 times daily.  11/02/13- 47 yoM never smoker followed for hx of asthma, rhinitis and chronic sinusitis. FOLLOWS FOR: Pt states after using Breo he got sick with URI(thinks he caught from other people-?medication causing some side effects). Was given 2 abx since last visit to help with this. Since completing the meds he feels good and wants to know if he should go back on Breo. Has not used Breo or Spriva since Jan 2015(Muscle pains went away since stopping Spriva). Continues to use Symbicort("can't live without it").  Using nebulizer at least once daily. Feels controlled now. Minor sneezing from pollen. Continues maintenance prednisone 15 mg daily and maintenance Zithromax for chronic bronchitis depression. Uses mask to mow or do yard work. 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  02/03/14- 47 yoM never smoker followed for hx of chronic obstructive asthma, rhinitis and chronic sinusitis. FOLLOWS FOR: patient states he has not felt well since hot weather has set in.  Pt stopped Zithromax and feels like he getting sick again. Did well after last flare in April. Now blames hot weather on easy weakness and dyspnea with exertion outdoors. Not much cough or wheeze. After 2 months off of maintenance Zithromax he just restarted. Cough is chronic and productive of white to yellow nonbloody sputum. Maintenance  prednisone 15 mg daily. Using his nebulizer machine 3-4 times daily. Office Spirometry 02/03/2014-severe obstructive airways disease. FVC 3.13/62%, FEV1 2.01/50%, FEV1/FVC 0.64, FEF 25-75% 0.87/22%.  07/27/14- 60 yoM never smoker followed for hx of chronic obstructive asthma, rhinitis and chronic sinusitis. Maintenance prednisone Stopped maintenance zithromax- use for acute infections FOLLOWS FOR: Pt reports having cough in November-used Zithromax to treat upper respiratory issues. Pt states that he is still having cough(decreased). Pt reports using nebulizer and  albuterol hfa more frequent.  Exacerbation of bronchitis with increased cough. Not using Spiriva and has been using Symbicort only once daily because of cost. Medications reviewed Rhinitis much improved and he has sense of smell back. CXR 02/03/14 IMPRESSION: Negative exam. Electronically Signed  By: Inge Rise M.D.  On: 02/03/2014 14:00  01/26/15- 74 yoM never smoker followed for hx of chronic obstructive asthma, rhinitis and chronic sinusitis. Maintenance prednisone 15 mg daily Follows for: c/o prod cough with dark brown. Treated for torn tendons and muscles by Dr. Ortencia Kick.  Some cough and wheeze most days. Discussed use of Symbicort. Rescue inhaler at least once on most days. Wife concerned he may have obstructive sleep apnea because of witnessed snore CXR 12/14/14 IMPRESSION: No active cardiopulmonary disease. Electronically Signed  By: Lahoma Crocker M.D.  On: 12/14/2014 11:47  05/17/15- 4 yoM never smoker followed for hx of chronic obstructive asthma, rhinitis and chronic sinusitis. Maintenance prednisone 15 mg daily FOLLOWS FOR:Pt has had hard time with sleep due to back injury as well;has not started using CPAP machine-tough getting use to it. Pt would like to discuss Meloxicam usagefor his breathing-states he could tell a difference(breathing little easier) Rx was given by Dr smith-sports medicine MD on first floor. Unattended Hpme Sleep Test 02/09/15- mild OSA/ AHI 9.3/ hr, desat to 85%, weight 276 lbs CPAP autotitration [employer] He blames a series of back and shoulder injuries making it hard to use CPAP. Uncomfortable rolling around, pulling on hose etc. We discussed oral appliance therapy as an alternative. Weight loss remains at goal. Breathing has been much better controlled without acute exacerbation on stable medications.  09/14/2015-50 year old male never smoker followed for chronic obstructive asthma, OSA, rhinitis/chronic sinusitis CPAP auto  5-15/[facility]- patient [place] FOR:Pt took CPAP machine back to [place] and states he never went to see Dr Ron Parker about oral appliance.  Prednisone maintenance-15 mg daily He has not been very active since job-related injury some time ago. This and steroids cause weight gain. We discussed steroid side effects and need to push for lower dosage. He feels well controlled as long as he sticks to his routine with nebulizer and Symbicort, Flonase.. Not much awareness of rhinitis since ENT surgery. We discussed possibility of a trial of verapamil, off label, based on one report that it seemed to reduce an inflammatory protein from nasal mucosa and chronic rhinitis. We are going to try slow prednisone reduction to see what he really needs for symptom control.  12/28/2015-50 year old male never smoker followed for chronic obstructive asthma, OSA/quit CPAP, rhinitis/chronic sinusitis Continues prednisone maintenance- FOLLOWS FOR: Pt no longer using CPAP machine-turned back in and never went to Dr Ron Parker. Pt is currently on 2 tablets of Prednisone Was able to reduce his prednisone to 10 mg daily using 5 mg tabs. "Bad pollen spring". Did well with Symbicort twice daily. Occasional NyQuil at night, Mucinex or Robitussin-DM. Still notes dyspnea on steps and hills but recovers more quickly. Cough productive clear mucus. No longer having significant nasal symptoms.  Review of Systems-See HPI Constitutional:    No- night sweats ,fevers, chills, fatigue, lassitude. HEENT:   No - headaches,  Difficulty swallowing, Tooth/dental problems, Sore throat,                No sneezing, itching, ear ache, CV:  No chest pain,  Orthopnea, PND, swelling in lower extremities, anasarca, dizziness, palpitations GI  No heartburn, indigestion, abdominal pain, nausea, vomiting,  Resp:.See HPI   +Some productive cough   No coughing up of blood.  No-change in color of mucus,                + wheeze Skin: no rash  or lesions. GU: . MS:  No joint pain or swelling.  [name] Psych:   change in mood or affect.  depression and  anxiety.  No memory loss.  Today Objective:   Physical Exam General- Alert, Oriented, Affect-appropriate, Distress- none acute, + overweight/thick neck Skin- rash-none, lesions- none, excoriation- none Lymphadenopathy- none Head- atraumatic            Eyes- Gross vision intact, PERRLA, conjunctivae clear secretions            Ears- Hearing, canals normal            Nose- Clear, no-Septal dev, mucus, polyps, erosion, perforation             Throat- Mallampati  III- IV , mucosa clear , drainage- none, tonsils- atrophic. , Neck- flexible , trachea midline, no stridor , thyroid nl, carotid no bruit Chest - symmetrical excursion , unlabored           Heart/CV- RRR , no murmur , no gallop  , no rub, nl s1 s2                           - JVD- none , edema- none, stasis changes- none, varices- none           Lung-  breath sounds coarse without wheeze, Wheeze-none, + few crackles right lung, unlabored,   Cough-none , dullness-none, rub- none           Chest wall-  Abd-  Br/ Gen/ Rectal- Not done, not indicated Extrem- cyanosis- none, clubbing, none, atrophy- none, strength- nl Neuro- grossly intact to observation

## 2015-12-28 NOTE — Patient Instructions (Signed)
Order- lab CBC w diff  We can continue current meds. Please call as needed

## 2016-01-05 ENCOUNTER — Other Ambulatory Visit: Payer: Self-pay | Admitting: Internal Medicine

## 2016-02-04 ENCOUNTER — Other Ambulatory Visit: Payer: Self-pay | Admitting: Internal Medicine

## 2016-04-14 ENCOUNTER — Other Ambulatory Visit: Payer: Self-pay | Admitting: Internal Medicine

## 2016-04-15 ENCOUNTER — Telehealth: Payer: Self-pay | Admitting: Internal Medicine

## 2016-04-15 NOTE — Telephone Encounter (Signed)
Attempted to contact pt. No answer, no option to leave a message. Will try back.  

## 2016-04-16 NOTE — Telephone Encounter (Signed)
ATC, NA - WCB 

## 2016-04-17 MED ORDER — MONTELUKAST SODIUM 10 MG PO TABS: ORAL_TABLET | ORAL | 3 refills | Status: DC

## 2016-04-17 MED ORDER — CLORAZEPATE DIPOTASSIUM 7.5 MG PO TABS: ORAL_TABLET | ORAL | 3 refills | Status: DC

## 2016-04-17 NOTE — Telephone Encounter (Signed)
I went ahead and sent the singulair, but chlorazepate is controlled, so need okay from [facility]  Please advise if okay to send 90 day supply clorazepate 7.5 mg bidprn  Last given rx for # 180 tablets on 09/14/15 Pt's spouse aware we will call her once we get the okay

## 2016-04-17 NOTE — Telephone Encounter (Signed)
Rx for Clorazepate printed, signed and faxed to [facility]. Called and left detailed message on voicemail advising that Rx has been sent to [facility]. Nothing further needed.

## 2016-04-17 NOTE — Telephone Encounter (Signed)
Patient wife called back checking on refill - She can be reached at [phone] - pr

## 2016-04-17 NOTE — Telephone Encounter (Signed)
Ok to refill 

## 2016-05-23 ENCOUNTER — Encounter: Payer: Self-pay | Admitting: Internal Medicine

## 2016-05-23 ENCOUNTER — Ambulatory Visit (INDEPENDENT_AMBULATORY_CARE_PROVIDER_SITE_OTHER): Payer: 59 | Admitting: Internal Medicine

## 2016-05-23 VITALS — BP 142/92 | HR 86 | Ht 70.0 in | Wt 283.4 lb

## 2016-05-23 DIAGNOSIS — J45909 Unspecified asthma, uncomplicated: Secondary | ICD-10-CM

## 2016-05-23 DIAGNOSIS — G4733 Obstructive sleep apnea (adult) (pediatric): Secondary | ICD-10-CM | POA: Diagnosis not present

## 2016-05-23 DIAGNOSIS — Z23 Encounter for immunization: Secondary | ICD-10-CM | POA: Diagnosis not present

## 2016-05-23 MED ORDER — MONTELUKAST SODIUM 10 MG PO TABS: ORAL_TABLET | ORAL | 3 refills | Status: DC

## 2016-05-23 MED ORDER — IPRATROPIUM-ALBUTEROL 0.5-2.5 (3) MG/3ML IN SOLN: RESPIRATORY_TRACT | 3 refills | Status: DC

## 2016-05-23 MED ORDER — BUDESONIDE-FORMOTEROL FUMARATE 160-4.5 MCG/ACT IN AERO: INHALATION_SPRAY | RESPIRATORY_TRACT | 3 refills | Status: DC

## 2016-05-23 MED ORDER — FLUTICASONE PROPIONATE 50 MCG/ACT NA SUSP: NASAL | 3 refills | Status: DC

## 2016-05-23 MED ORDER — PREDNISONE 5 MG PO TABS: ORAL_TABLET | ORAL | 3 refills | Status: DC

## 2016-05-23 MED ORDER — ALBUTEROL SULFATE HFA 108 (90 BASE) MCG/ACT IN AERS: 2.0000 | INHALATION_SPRAY | Freq: Four times a day (QID) | RESPIRATORY_TRACT | 3 refills | Status: DC | PRN

## 2016-05-23 NOTE — Progress Notes (Signed)
   Patient ID: STOY STROUSE, male    DOB: [birth year], 50 y.o.   MRN: [id number]  HPI   01/09/2016-50 year old male never smoker followed for chronic obstructive asthma, OSA/quit CPAP, rhinitis/chronic sinusitis Continues prednisone maintenance- FOLLOWS FOR: Pt no longer using CPAP machine-turned back in and never went to Dr Ron Parker. Pt is currently on 2 tablets of Prednisone   05/23/2016-50 year old male never smoker followed for chronic obstructive asthma, OSA/quit CPAP, rhinitis/chronic sinusitis Continues prednisone maintenance FOLLOWS FOR:Pt. has been coughing with white to yellow phelgm, wheezing, feels SOB sometimes,Pt. states breathing has remains unchanged,  Pt. denies chest pain Cough is in his normal range, changing with weather. Phlegm usually clear. No acute exacerbation or infection. He has not felt able to tolerate reduction of prednisone below 10 mg daily maintenance.  Review of Systems-See HPI Constitutional:    No- night sweats ,fevers, chills, fatigue, lassitude. HEENT:   No - headaches,  Difficulty swallowing, Tooth/dental problems, Sore throat,                No sneezing, itching, ear ache, CV:  No chest pain,  Orthopnea, PND, swelling in lower extremities, anasarca, dizziness, palpitations GI  No heartburn, indigestion, abdominal pain, nausea, vomiting,  Resp:.See HPI   +Some productive cough   No coughing up of blood.  No-change in color of mucus,                + wheeze Skin: no rash or lesions. GU: . MS:  No joint pain or swelling.  [name] Psych:   change in mood or affect.  depression and  anxiety.  No memory loss.  Today Objective:   Physical Exam General- Alert, Oriented, Affect-appropriate, Distress- none acute, + overweight/thick neck Skin- rash-none, lesions- none, excoriation- none Lymphadenopathy- none Head- atraumatic            Eyes- Gross vision intact, PERRLA, conjunctivae clear secretions            Ears- Hearing, canals normal            Nose-  Clear, no-Septal dev, mucus, polyps, erosion, perforation             Throat- Mallampati  III- IV , mucosa clear , drainage- none, tonsils- atrophic. , Neck- flexible , trachea midline, no stridor , thyroid nl, carotid no bruit Chest - symmetrical excursion , unlabored           Heart/CV- RRR , no murmur , no gallop  , no rub, nl s1 s2                           - JVD- none , edema- none, stasis changes- none, varices- none           Lung-  Wheeze-none, clear, unlabored,   Cough-none , dullness-none, rub- none           Chest wall-  Abd-  Br/ Gen/ Rectal- Not done, not indicated Extrem- cyanosis- none, clubbing, none, atrophy- none, strength- nl Neuro- grossly intact to observation

## 2016-05-23 NOTE — Patient Instructions (Signed)
Flu vax- standard  Scripts printed for med refills

## 2016-05-25 NOTE — Assessment & Plan Note (Addendum)
Quit CPAP and unwilling to try oral appliance Weight loss encouraged, sleep off flat of back

## 2016-05-25 NOTE — Assessment & Plan Note (Signed)
Stable asthmatic bronchitis pattern on maintenance prednisone 10 mg daily. We discussed steroid therapy again and discussed alternatives.

## 2016-08-05 ENCOUNTER — Telehealth: Payer: Self-pay | Admitting: Internal Medicine

## 2016-08-05 MED ORDER — AMOXICILLIN-POT CLAVULANATE 875-125 MG PO TABS: 1.0000 | ORAL_TABLET | Freq: Two times a day (BID) | ORAL | 0 refills | Status: DC

## 2016-08-05 MED ORDER — PROMETHAZINE-CODEINE 6.25-10 MG/5ML PO SYRP: 5.0000 mL | ORAL_SOLUTION | Freq: Four times a day (QID) | ORAL | 0 refills | Status: DC | PRN

## 2016-08-05 NOTE — Telephone Encounter (Signed)
Spoke with pt, who states he feels that he has bronchitis. Pt c/o non prod cough, wheezing & chest tightness with coughing spell. X 3d  Pt denies any fever, chills or sweats.  Pt has taking robitussin daily and duoneb nebs 6 X daily, with mild relief. Pt is requesting an abx and a cough syrup.  CY please advise. Thanks.  Allergies  Allergen Reactions  . Fluticasone-Salmeterol Other (See Comments)    REACTION: jaw joint pain   Current Outpatient Prescriptions on File Prior to Visit  Medication Sig Dispense Refill  . albuterol (VENTOLIN HFA) 108 (90 Base) MCG/ACT inhaler Inhale 2 puffs into the lungs every 6 (six) hours as needed for wheezing or shortness of breath. 3 Inhaler 3  . budesonide-formoterol (SYMBICORT) 160-4.5 MCG/ACT inhaler Inhale 2 puffs twice daily  , rinse mouth after use 30.6 g 3  . clorazepate (TRANXENE) 7.5 MG tablet TAKE 1 TABLET BY MOUTH TWICE A DAY AS NEEDED 180 tablet 3  . fluconazole (DIFLUCAN) 100 MG tablet Take 1 tablet (100 mg total) by mouth daily as needed. 7 tablet 12  . fluticasone (FLONASE) 50 MCG/ACT nasal spray INSTILL 2 SPRAYS IN EACH  NOSTRIL DAILY 48 g 3  . ipratropium-albuterol (DUONEB) 0.5-2.5 (3) MG/3ML SOLN Use 1 vial via nebulizer 4  times daily as needed 1080 mL 3  . montelukast (SINGULAIR) 10 MG tablet 1 daily 90 tablet 3  . predniSONE (DELTASONE) 5 MG tablet TAKE 2 TABLETS BY MOUTH  DAILY OR AS DIRECTED 270 tablet 3   No current facility-administered medications on file prior to visit.

## 2016-08-05 NOTE — Telephone Encounter (Signed)
Spoke with pt. And gave him CY message. Pt. Has agreed to treatment options. Rx was printed for pt. To pick up. Pt. Is aware that it will be up front once signed for him to be picked up. His Augmentin was sent in to his pharmacy. Nothing further is needed at this time.

## 2016-08-05 NOTE — Telephone Encounter (Signed)
Offer augmentin 875 # 14, 1 twice daily           prometh codeine 200 ml,   5 ml every 6 hours if needed for cough

## 2016-08-05 NOTE — Telephone Encounter (Signed)
rx printed, signed, and given to pt.  See previous phone note dated today.  Nothing further needed.

## 2016-09-10 ENCOUNTER — Other Ambulatory Visit: Payer: Self-pay | Admitting: Internal Medicine

## 2016-09-23 ENCOUNTER — Telehealth: Payer: Self-pay | Admitting: Internal Medicine

## 2016-09-23 NOTE — Telephone Encounter (Signed)
Pt is requesting a refill of his Tranxene 7.5mg  -- States that he will come by and pick this up if okayed. Pt trying to get one more Rx filled before his insurance changes at the end of the month. Pt is requesting a 90-day supply. Should be able to phone this in, if unable to phone in patient requests a call back on his mobile number [phone] letting him know and he will pick it up.  Please advise Dr Annamaria Boots. Thanks.   Allergies as of 09/23/2016      Reactions   Fluticasone-salmeterol Other (See Comments)   REACTION: jaw joint pain      Medication List       Accurate as of 09/23/16  4:43 PM. Always use your most recent med list.          albuterol 108 (90 Base) MCG/ACT inhaler Commonly known as:  VENTOLIN HFA Inhale 2 puffs into the lungs every 6 (six) hours as needed for wheezing or shortness of breath.   amoxicillin-clavulanate 875-125 MG tablet Commonly known as:  AUGMENTIN Take 1 tablet by mouth 2 (two) times daily.   clorazepate 7.5 MG tablet Commonly known as:  TRANXENE TAKE 1 TABLET BY MOUTH TWICE A DAY AS NEEDED   fluconazole 100 MG tablet Commonly known as:  DIFLUCAN Take 1 tablet (100 mg total) by mouth daily as needed.   fluticasone 50 MCG/ACT nasal spray Commonly known as:  FLONASE USE 2 SPRAYS IN EACH  NOSTRIL DAILY   ipratropium-albuterol 0.5-2.5 (3) MG/3ML Soln Commonly known as:  DUONEB Use 1 vial via nebulizer 4  times daily as needed   montelukast 10 MG tablet Commonly known as:  SINGULAIR 1 daily   predniSONE 5 MG tablet Commonly known as:  DELTASONE TAKE 3 TABLETS BY MOUTH  DAILY OR AS DIRECTED   promethazine-codeine 6.25-10 MG/5ML syrup Commonly known as:  PHENERGAN with CODEINE Take 5 mLs by mouth every 6 (six) hours as needed for cough.   SYMBICORT 160-4.5 MCG/ACT inhaler Generic drug:  budesonide-formoterol INHALE 2 PUFFS TWICE DAILY  , RINSE MOUTH AFTER USE

## 2016-09-23 NOTE — Telephone Encounter (Signed)
Ok for 90 day refill

## 2016-09-24 MED ORDER — CLORAZEPATE DIPOTASSIUM 7.5 MG PO TABS: ORAL_TABLET | ORAL | 0 refills | Status: DC

## 2016-09-24 NOTE — Telephone Encounter (Signed)
Spoke with pt. He is aware that this prescription can be called in. Rx has been called in to [facility] per his request. Nothing further was needed.

## 2016-10-02 ENCOUNTER — Ambulatory Visit (INDEPENDENT_AMBULATORY_CARE_PROVIDER_SITE_OTHER): Payer: 59 | Admitting: Internal Medicine

## 2016-10-02 ENCOUNTER — Ambulatory Visit (INDEPENDENT_AMBULATORY_CARE_PROVIDER_SITE_OTHER)
Admission: RE | Admit: 2016-10-02 | Discharge: 2016-10-02 | Disposition: A | Discharge status: Home / Self Care | Payer: 59 | Source: Ambulatory Visit | Attending: Internal Medicine | Admitting: Internal Medicine

## 2016-10-02 ENCOUNTER — Other Ambulatory Visit: Payer: Worker's Compensation

## 2016-10-02 ENCOUNTER — Encounter: Payer: Self-pay | Admitting: Internal Medicine

## 2016-10-02 VITALS — BP 124/80 | HR 92 | Ht 70.0 in | Wt 288.0 lb

## 2016-10-02 DIAGNOSIS — J3089 Other allergic rhinitis: Secondary | ICD-10-CM

## 2016-10-02 DIAGNOSIS — J449 Chronic obstructive pulmonary disease, unspecified: Secondary | ICD-10-CM

## 2016-10-02 DIAGNOSIS — J302 Other seasonal allergic rhinitis: Secondary | ICD-10-CM

## 2016-10-02 DIAGNOSIS — G4733 Obstructive sleep apnea (adult) (pediatric): Secondary | ICD-10-CM | POA: Diagnosis not present

## 2016-10-02 DIAGNOSIS — J320 Chronic maxillary sinusitis: Secondary | ICD-10-CM | POA: Diagnosis not present

## 2016-10-02 LAB — NITRIC OXIDE: NITRIC OXIDE: 33

## 2016-10-02 MED ORDER — COMPRESSOR NEBULIZER MISC: 1.0000 | Freq: Four times a day (QID) | 0 refills | Status: AC | PRN

## 2016-10-02 NOTE — Patient Instructions (Addendum)
Order- FENO     Dx chronic obstructive asthma persistent  Order-Office Spirometry  Order-Allergy Profile  Order CXR  Please call for med refills as needed

## 2016-10-02 NOTE — Progress Notes (Signed)
Patient ID: [name] [name], male    DOB: 07-05-1966, 51 y.o.   MRN: [id number]  HPI male never smoker followed for chronic obstructive asthma, OSA/quit CPAP, rhinitis/chronic sinusitis Unattended Home Sleep Test 02/09/15- mild OSA/ AHI 9.3/ hr, desat to 85%, weight 276 lbs PFT 03/06/2007 PFT 01/29/2011 Office Spirometry 02/03/2014-severe obstructive airways disease with low FVC.  FVC 3.13/62%, FEV1 2.01/50%, ratio 0.64,  FEF 25-75% 0.87/22% CT sinus 11/23/2009-moderate chronic appearing pansinusitis. 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 FVC 2.66/53%, FEV1 2.0/51%, ratio 0.75, FEF 25-75% 1.60/46% ----------------------------------------------------------------------------------------   05/23/2016-51 year old male never smoker followed for chronic obstructive asthma, OSA/quit CPAP, rhinitis/chronic polypoid sinusitis, Continues prednisone maintenance He had chosen not to get an oral appliance for OSA. He had chosen not to try Xolair, concerned about side effects. FOLLOWS FOR:Pt. has been coughing with white to yellow phelgm, wheezing, feels SOB sometimes,Pt. states breathing  remains unchanged,  Pt. denies chest pain Cough is in his normal range, changing with weather. Phlegm usually clear. No acute exacerbation or infection. He has not felt able to tolerate reduction of prednisone below 10 mg daily maintenance.   10/02/2016-51 year old male never smoker followed for chronic obstructive asthma, OSA/quit CPAP, rhinitis/chronic polypoid sinusitis, Continues prednisone maintenance 4 month follow up. States breathing has been ok. Cold weather has triggered asthma, but feels better today. Had the flu 9 weeks. Says he had the flu 9 weeks ago-resolved. Now bothered by tree pollen causing increased cough and throat tickle. Says his nose still works well after sinus  surgery in 2012. Still has good sense of smell and rarely needs to use nasal spray as long as he continues Flonase, Singulair and prednisone 10 mg daily. He also uses nebulizer with DuoNeb twice daily. Not much wheezing. FENO 10/02/16- 33 H Office spirometry 10/02/16-moderate restriction and obstruction. FVC 2.66/53%, FEV1 2.0/51%, ratio 0.75, FEF 25-75% 1.60/46%  Review of Systems-See HPI Constitutional:    No- night sweats ,fevers, chills, fatigue, lassitude. HEENT:   No - headaches,  Difficulty swallowing, Tooth/dental problems, Sore throat,                No sneezing, itching, ear ache, CV:  No chest pain,  Orthopnea, PND, swelling in lower extremities, anasarca, dizziness, palpitations GI  No heartburn, indigestion, abdominal pain, nausea, vomiting,  Resp:.See HPI   +Some productive cough   No coughing up of blood.  No-change in color of mucus,                + wheeze Skin: no rash or lesions. GU: . MS:  No joint pain or swelling.  [name] Psych:   change in mood or affect.  depression and  anxiety.  No memory loss.  Today Objective:   Physical Exam General- Alert, Oriented, Affect-appropriate, Distress- none acute, + Obese / thick neck Skin- rash-none, lesions- none, excoriation- none Lymphadenopathy- none Head- atraumatic            Eyes- Gross vision intact, PERRLA, conjunctivae clear secretions            Ears- Hearing, canals normal            Nose- Clear, no-Septal dev, mucus, polyps, erosion, perforation             Throat- Mallampati  III- IV , mucosa clear , drainage- none, tonsils- atrophic. , Neck- flexible , trachea midline, no stridor , thyroid nl, carotid no bruit Chest - symmetrical  excursion , unlabored           Heart/CV- RRR , no murmur , no gallop  , no rub, nl s1 s2                           - JVD- none , edema- none, stasis changes- none, varices- none           Lung-  Wheeze-none, clear, unlabored,   cough + after office spirometry , dullness-none, rub- none            Chest wall-  Abd-  Br/ Gen/ Rectal- Not done, not indicated Extrem- cyanosis- none, clubbing, none, atrophy- none, strength- nl Neuro- grossly intact to observation

## 2016-10-03 LAB — RESPIRATORY ALLERGY PROFILE REGION II ~~LOC~~
Allergen, D pternoyssinus,d7: <0.1 kU/L
Allergen, Mouse Urine Protein, e78: <0.1 kU/L
Allergen, Oak,t7: <0.1 kU/L
Allergen, P. notatum, m1: <0.1 kU/L
Cat Dander: <0.1 kU/L
D. farinae: <0.1 kU/L
Elm IgE: <0.1 kU/L
IGE (IMMUNOGLOBULIN E), SERUM: 295 kU/L — AB (ref <115)
Johnson Grass: <0.1 kU/L
Pecan/Hickory Tree IgE: <0.1 kU/L
Timothy Grass: <0.1 kU/L

## 2016-10-15 NOTE — Progress Notes (Signed)
Left message for patient to contact office for medical results.

## 2016-11-16 NOTE — Assessment & Plan Note (Signed)
He quit CPAP then chose not to explore oral appliances. We have discussed this again emphasizing at a minimum-weight loss and sleep off flat of back.

## 2016-11-16 NOTE — Assessment & Plan Note (Signed)
He is controlled and stable on current regimen. We again discussed steroid therapy, hoping to reduce this. He has not felt he could get below 10 mg total dose daily. Plan-CXR, lab for allergy profile

## 2016-11-16 NOTE — Assessment & Plan Note (Signed)
He remains much more comfortable with nasal function and sense of smell following sinus surgery 2012. No polyps.

## 2016-11-16 NOTE — Assessment & Plan Note (Signed)
Plan-lab for allergy profile

## 2016-12-24 NOTE — Assessment & Plan Note (Signed)
Airway inflammation began originally as a sinus/upper airway process. Fortunately he dramatically improved after ENT surgery with little upper airway recurrence. He is not much aware of postnasal drainage

## 2016-12-24 NOTE — Assessment & Plan Note (Signed)
Here pains dependent on prednisone 10 mg daily. He/his wife had not wanted to try Xolair because of reported side effects. We have had some discussion of Nucala and he might be interested in learning more about that. We will recheck eosinophil levels. Plan-CBC with differential. Call for inhaler refills as needed.

## 2016-12-30 ENCOUNTER — Telehealth: Payer: Self-pay | Admitting: Internal Medicine

## 2016-12-30 NOTE — Telephone Encounter (Signed)
Spoke with pt's wife, Claiborne Billings. Pt will no longer be using mail order pharmacies. He will need written 90 day supplies of Singulair, Prednisone, Tranxene, Flonase, Duoneb, Ventolin, Symbicort and Diflucan.  CY - are you okay with giving 90 day supplies of these prescriptions? Thanks!

## 2016-12-31 MED ORDER — IPRATROPIUM-ALBUTEROL 0.5-2.5 (3) MG/3ML IN SOLN: RESPIRATORY_TRACT | 11 refills | Status: DC

## 2016-12-31 MED ORDER — FLUCONAZOLE 100 MG PO TABS: 100.0000 mg | ORAL_TABLET | Freq: Every day | ORAL | 1 refills | Status: DC | PRN

## 2016-12-31 MED ORDER — BUDESONIDE-FORMOTEROL FUMARATE 160-4.5 MCG/ACT IN AERO: INHALATION_SPRAY | RESPIRATORY_TRACT | 11 refills | Status: DC

## 2016-12-31 MED ORDER — MONTELUKAST SODIUM 10 MG PO TABS: ORAL_TABLET | ORAL | 11 refills | Status: DC

## 2016-12-31 MED ORDER — CLORAZEPATE DIPOTASSIUM 7.5 MG PO TABS: ORAL_TABLET | ORAL | 5 refills | Status: DC

## 2016-12-31 MED ORDER — FLUTICASONE PROPIONATE 50 MCG/ACT NA SUSP: 2.0000 | Freq: Every day | NASAL | 11 refills | Status: DC

## 2016-12-31 MED ORDER — PREDNISONE 5 MG PO TABS: ORAL_TABLET | ORAL | 11 refills | Status: DC

## 2016-12-31 MED ORDER — ALBUTEROL SULFATE HFA 108 (90 BASE) MCG/ACT IN AERS: 2.0000 | INHALATION_SPRAY | Freq: Four times a day (QID) | RESPIRATORY_TRACT | 11 refills | Status: DC | PRN

## 2016-12-31 NOTE — Telephone Encounter (Signed)
lmtcb x1 for pt's wife, [name] [name].

## 2016-12-31 NOTE — Telephone Encounter (Signed)
Offer diflucan 150 mg, # 60, 1 daily as directed for yeast , ref x 1

## 2016-12-31 NOTE — Telephone Encounter (Signed)
CY  Please Advise-  Spoke with pt and he stated he no longer wants a 90 day supply because his insurance plan is not as good as it was previously. He would like a 30 day supply with #11 RFs he wants printed to be picked up. Pt states he uses the diflucan because of the prednisone and symbicort causing Yeast infection in his mouth. He states he probably only need #60 of the Diflucan and he can make that last him for a while.

## 2016-12-31 NOTE — Telephone Encounter (Signed)
All prescriptions have been printed and signed by CY. Pt is aware. He ask that we place these in the mail. Rxs have been placed in the outgoing mail. Nothing further was needed at this time.

## 2016-12-31 NOTE — Telephone Encounter (Signed)
Yes. Please clarify what he needs for Diflucan.

## 2016-12-31 NOTE — Telephone Encounter (Signed)
Patient called back - pt uses Diflucan because the prednisone and Symbicort causes infections in his mouth - this is recurrent and he usually has to take one pill of Diflucan a week now. He would like all prescriptions printed and he will pick them up. He can be reached on his cell phone [phone] - if not available call home at [phone] - pr

## 2017-04-09 ENCOUNTER — Ambulatory Visit (INDEPENDENT_AMBULATORY_CARE_PROVIDER_SITE_OTHER): Payer: BLUE CROSS/BLUE SHIELD | Admitting: Internal Medicine

## 2017-04-09 ENCOUNTER — Encounter: Payer: Self-pay | Admitting: Internal Medicine

## 2017-04-09 DIAGNOSIS — B370 Candidal stomatitis: Secondary | ICD-10-CM | POA: Diagnosis not present

## 2017-04-09 DIAGNOSIS — J320 Chronic maxillary sinusitis: Secondary | ICD-10-CM

## 2017-04-09 DIAGNOSIS — Z23 Encounter for immunization: Secondary | ICD-10-CM

## 2017-04-09 DIAGNOSIS — J449 Chronic obstructive pulmonary disease, unspecified: Secondary | ICD-10-CM | POA: Diagnosis not present

## 2017-04-09 DIAGNOSIS — B3781 Candidal esophagitis: Secondary | ICD-10-CM | POA: Diagnosis not present

## 2017-04-09 MED ORDER — MONTELUKAST SODIUM 10 MG PO TABS: ORAL_TABLET | ORAL | 11 refills | Status: DC

## 2017-04-09 MED ORDER — CLORAZEPATE DIPOTASSIUM 7.5 MG PO TABS: ORAL_TABLET | ORAL | 5 refills | Status: DC

## 2017-04-09 MED ORDER — PREDNISONE 5 MG PO TABS: ORAL_TABLET | ORAL | 11 refills | Status: DC

## 2017-04-09 NOTE — Patient Instructions (Signed)
Refill scripts printed  Any time you can take 1 less prednisone tab, that would be great.  Please call as needed  Flu  vax

## 2017-04-09 NOTE — Progress Notes (Signed)
Patient ID: [name] [name], male    DOB: 17-Jul-1966, 51 y.o.   MRN: [id number]  HPI male never smoker followed for chronic obstructive asthma, OSA/quit CPAP, rhinitis/chronic sinusitis Unattended Home Sleep Test 02/09/15- mild OSA/ AHI 9.3/ hr, desat to 85%, weight 276 lbs PFT 03/06/2007 PFT 01/29/2011 Office Spirometry 02/03/2014-severe obstructive airways disease with low FVC.  FVC 3.13/62%, FEV1 2.01/50%, ratio 0.64,  FEF 25-75% 0.87/22% CT sinus 11/23/2009-moderate chronic appearing pansinusitis. 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 FVC 2.66/53%, FEV1 2.0/51%, ratio 0.75, FEF 25-75% 1.60/46% --------------------------------------------------------------------------------------  10/02/2016-51 year old male never smoker followed for chronic obstructive asthma, OSA/quit CPAP, rhinitis/chronic polypoid sinusitis, Continues prednisone maintenance 4 month follow up. States breathing has been ok. Cold weather has triggered asthma, but feels better today. Had the flu 9 weeks. Says he had the flu 9 weeks ago-resolved. Now bothered by tree pollen causing increased cough and throat tickle. Says his nose still works well after sinus surgery in 2012. Still has good sense of smell and rarely needs to use nasal spray as long as he continues Flonase, Singulair and prednisone 10 mg daily. He also uses nebulizer with DuoNeb twice daily. Not much wheezing. FENO 10/02/16- 33 H Office spirometry 10/02/16-moderate restriction and obstruction. FVC 2.66/53%, FEV1 2.0/51%, ratio 0.75, FEF 25-75% 1.60/46%  04/09/17- 51 year old male never smoker followed for chronic obstructive asthma, OSA/quit CPAP, rhinitis/chronic polypoid sinusitis, Continues prednisone maintenance 15 mg daily. He couldn't stay stable on 10 mg. Humidity was very hard on him this summer much less work stress now.  Asked change his prescriptions to 1 month rather than mail-order. Still uses clorazepate to help with stress. Pt is doing well overall. CXR 10/02/16 IMPRESSION: 1. Mild bibasilar airspace opacities most likely represent atelectasis or scarring in the setting of low lung volumes. 2. No other acute cardiopulmonary disease.  Review of Systems-See HPI  + = positive Constitutional:    No- night sweats ,fevers, chills, fatigue, lassitude. HEENT:   No - headaches,  Difficulty swallowing, Tooth/dental problems, Sore throat,                No sneezing, itching, ear ache, CV:  No chest pain,  Orthopnea, PND, swelling in lower extremities, anasarca, dizziness, palpitations GI  No heartburn, indigestion, abdominal pain, nausea, vomiting,  Resp:.See HPI   +Some productive cough   No coughing up of blood.  No-change in color of mucus,                + wheeze Skin: no rash or lesions. GU: . MS:  No joint pain or swelling.  [name] Psych:   change in mood or affect.  depression and  anxiety.  No memory loss.  Today Objective:   Physical Exam General- Alert, Oriented, Affect-appropriate, Distress- none acute, + Obese / thick neck Skin- rash-none, lesions- none, excoriation- none Lymphadenopathy- none Head- atraumatic            Eyes- Gross vision intact, PERRLA, conjunctivae clear secretions            Ears- Hearing, canals normal            Nose- Clear, no-Septal dev, mucus, polyps, erosion, perforation             Throat- Mallampati  III- IV , mucosa clear , drainage- none, tonsils- atrophic. , Neck- flexible , trachea midline, no stridor , thyroid nl, carotid no bruit Chest - symmetrical excursion , unlabored  Heart/CV- RRR , no murmur , no gallop  , no rub, nl s1 s2                           - JVD- none , edema- none, stasis changes- none, varices- none           Lung-  Wheeze-none, clear, unlabored,   cough -None , dullness-none, rub- none           Chest wall-  Abd-  Br/ Gen/ Rectal-  Not done, not indicated Extrem- cyanosis- none, clubbing, none, atrophy- none, strength- nl Neuro- grossly intact to observation

## 2017-04-13 NOTE — Assessment & Plan Note (Signed)
Not noticing much active nasal discomfort currently.

## 2017-04-13 NOTE — Assessment & Plan Note (Signed)
He wanted reassurance. I see no evidence of thrush at this exam.

## 2017-04-13 NOTE — Assessment & Plan Note (Signed)
Now that humidity is fading he feels he is doing pretty well. 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 Plan-meds refilled and discussed. Steroid talk done again. Flu vaccine

## 2017-04-28 ENCOUNTER — Telehealth: Payer: Self-pay | Admitting: Internal Medicine

## 2017-04-28 MED ORDER — IPRATROPIUM-ALBUTEROL 0.5-2.5 (3) MG/3ML IN SOLN: RESPIRATORY_TRACT | 3 refills | Status: DC

## 2017-04-28 NOTE — Telephone Encounter (Signed)
Spoke with Claiborne Billings (EC). She is requesting a printed copy of patient's DuoNeb rx due to an insurance change. Ambrose Pancoast that I will print out the RX, have CY sign it, and place it up front. She verbalized understanding. Nothing else needed at time of call.

## 2017-06-17 ENCOUNTER — Other Ambulatory Visit: Payer: Self-pay | Admitting: Internal Medicine

## 2017-06-17 MED ORDER — MONTELUKAST SODIUM 10 MG PO TABS: ORAL_TABLET | ORAL | 3 refills | Status: DC

## 2017-06-27 ENCOUNTER — Telehealth: Payer: Self-pay | Admitting: Internal Medicine

## 2017-06-27 MED ORDER — AMOXICILLIN-POT CLAVULANATE 875-125 MG PO TABS: 1.0000 | ORAL_TABLET | Freq: Two times a day (BID) | ORAL | 0 refills | Status: DC

## 2017-06-27 NOTE — Telephone Encounter (Signed)
Ok amoxacillin 500 mg, # 14, 1 twice daily

## 2017-06-27 NOTE — Telephone Encounter (Signed)
Called pt's wife and advised message from the provider. Pt understood and verbalized understanding. Nothing further is needed.   Rx sent into pharmacy.

## 2017-06-27 NOTE — Telephone Encounter (Signed)
Spoke with pt's wife, he would like a refill on the Amoxicillin. He is coughing and wheezing without mucus production. He has to work this weekend and wants to get something before the weekend since we have tthe bad weather approaching. He denies any other symptoms and just wants the ABX as a prevention.    [facility]  Current Outpatient Medications on File Prior to Visit  Medication Sig Dispense Refill  . albuterol (VENTOLIN HFA) 108 (90 Base) MCG/ACT inhaler Inhale 2 puffs into the lungs every 6 (six) hours as needed for wheezing or shortness of breath. 1 Inhaler 11  . budesonide-formoterol (SYMBICORT) 160-4.5 MCG/ACT inhaler INHALE 2 PUFFS TWICE DAILY  , RINSE MOUTH AFTER USE 1 Inhaler 11  . clorazepate (TRANXENE) 7.5 MG tablet TAKE 1 TABLET BY MOUTH TWICE A DAY AS NEEDED 60 tablet 5  . fluconazole (DIFLUCAN) 100 MG tablet Take 1 tablet (100 mg total) by mouth daily as needed. 60 tablet 1  . fluticasone (FLONASE) 50 MCG/ACT nasal spray Place 2 sprays into both nostrils daily. 16 g 11  . ipratropium-albuterol (DUONEB) 0.5-2.5 (3) MG/3ML SOLN Use 1 vial via nebulizer 4  times daily as needed 360 mL 3  . montelukast (SINGULAIR) 10 MG tablet 1 daily 90 tablet 3  . Nebulizers (COMPRESSOR NEBULIZER) MISC 1 Device by Does not apply route 4 (four) times daily as needed. 1 each 0  . predniSONE (DELTASONE) 5 MG tablet TAKE 3 TABLETS BY MOUTH  DAILY OR AS DIRECTED 90 tablet 11  . promethazine-codeine (PHENERGAN WITH CODEINE) 6.25-10 MG/5ML syrup Take 5 mLs by mouth every 6 (six) hours as needed for cough. 200 mL 0   No current facility-administered medications on file prior to visit.    Allergies  Allergen Reactions  . Fluticasone-Salmeterol Other (See Comments)    REACTION: jaw joint pain

## 2017-07-25 ENCOUNTER — Telehealth: Payer: Self-pay | Admitting: Internal Medicine

## 2017-07-25 MED ORDER — DOXYCYCLINE HYCLATE 100 MG PO TABS: ORAL_TABLET | ORAL | 0 refills | Status: DC

## 2017-07-25 MED ORDER — PROMETHAZINE-CODEINE 6.25-10 MG/5ML PO SYRP: 5.0000 mL | ORAL_SOLUTION | Freq: Four times a day (QID) | ORAL | 0 refills | Status: DC | PRN

## 2017-07-25 NOTE — Telephone Encounter (Signed)
Pt said that someone was going to call him back [phone]

## 2017-07-25 NOTE — Telephone Encounter (Signed)
Spoke with patient regarding CY recommendations Advised patient recommendations Placed verbal order for premeth codeine cough syrup 234ml to [facility] order for RX for Doxycycline 100mg  #8, take 2 today and then one daily until completed Pt verbalized understanding Nothing further needed

## 2017-07-25 NOTE — Telephone Encounter (Signed)
Offer prometh codeine cough syrup 200 ml,  5 ml every 6 hours if needed for cough           Doxycycline 100 mg, # 8   2 today then one daily

## 2017-07-25 NOTE — Telephone Encounter (Signed)
Spoke with pt, he is coughing with yellow/green color, chest congestion, no fever but complains of a sore throat. He denies body aches but feels some back pain from the coughing. On 12/7 he was given an ABX for similar symptoms but has not gotten better. He would like a cough medication and an ABX sent into his pharmacy.   [facility]  Current Outpatient Medications on File Prior to Visit  Medication Sig Dispense Refill  . albuterol (VENTOLIN HFA) 108 (90 Base) MCG/ACT inhaler Inhale 2 puffs into the lungs every 6 (six) hours as needed for wheezing or shortness of breath. 1 Inhaler 11  . amoxicillin-clavulanate (AUGMENTIN) 875-125 MG tablet Take 1 tablet by mouth 2 (two) times daily. 14 tablet 0  . budesonide-formoterol (SYMBICORT) 160-4.5 MCG/ACT inhaler INHALE 2 PUFFS TWICE DAILY  , RINSE MOUTH AFTER USE 1 Inhaler 11  . clorazepate (TRANXENE) 7.5 MG tablet TAKE 1 TABLET BY MOUTH TWICE A DAY AS NEEDED 60 tablet 5  . fluconazole (DIFLUCAN) 100 MG tablet Take 1 tablet (100 mg total) by mouth daily as needed. 60 tablet 1  . fluticasone (FLONASE) 50 MCG/ACT nasal spray Place 2 sprays into both nostrils daily. 16 g 11  . ipratropium-albuterol (DUONEB) 0.5-2.5 (3) MG/3ML SOLN Use 1 vial via nebulizer 4  times daily as needed 360 mL 3  . montelukast (SINGULAIR) 10 MG tablet 1 daily 90 tablet 3  . Nebulizers (COMPRESSOR NEBULIZER) MISC 1 Device by Does not apply route 4 (four) times daily as needed. 1 each 0  . predniSONE (DELTASONE) 5 MG tablet TAKE 3 TABLETS BY MOUTH  DAILY OR AS DIRECTED 90 tablet 11  . promethazine-codeine (PHENERGAN WITH CODEINE) 6.25-10 MG/5ML syrup Take 5 mLs by mouth every 6 (six) hours as needed for cough. 200 mL 0   No current facility-administered medications on file prior to visit.    Allergies  Allergen Reactions  . Fluticasone-Salmeterol Other (See Comments)    REACTION: jaw joint pain

## 2017-07-28 NOTE — Progress Notes (Deleted)
[name] [name], [place] [address] Phone: [phone] Subjective:    I'm seeing this patient by the request  of:    CC: Left arm pain  [id number]  [name] [name] is a 52 y.o. male coming in with complaint of ***  Onset-  Location Duration-  Character- Aggravating factors- Reliving factors-  Therapies tried-  Severity-     Past Medical History:  Diagnosis Date  . Allergic rhinitis   . Complication of anesthesia    difficulty waking up from lithotripsy and nasal surgery  . History of chronic bronchitis   . History of kidney stones   . Left ureteral stone   . Mild obstructive sleep apnea    per study 02-09-2015  recommended oral appliance / cpap-- pt tried intolerant  . Moderate persistent asthma    pulmologist-  dr young   Past Surgical History:  Procedure Laterality Date  . CYSTO/  LEFT URETERAL STENT PLACEMENT  03-10-2002  . CYSTOSCOPY W/ URETERAL STENT PLACEMENT Left 08/07/2015   Procedure: CYSTOSCOPY, Left Retrograde Pyelogram, Left Ureteral Stent Placement;  Surgeon: Irine Seal, MD;  Location: [facility];  Service: Urology;  Laterality: Left;  . CYSTOSCOPY/URETEROSCOPY/HOLMIUM LASER/STENT PLACEMENT Left 08/17/2015   Procedure: CYSTOSCOPY / LEFT URETEROSCOPY / HOLMIUM LASER LITHOTRIPSY;  Surgeon: Irine Seal, MD;  Location: [facility];  Service: Urology;  Laterality: Left;  . EXTRACORPOREAL SHOCK WAVE LITHOTRIPSY  2006  . SEPTOPLASTY WITH ETHMOIDECTOMY, AND MAXILLARY ANTROSTOMY  09-24-2010  . STONE EXTRACTION WITH BASKET Left 08/17/2015   Procedure: STONE EXTRACTION WITH BASKET;  Surgeon: Irine Seal, MD;  Location: [facility];  Service: Urology;  Laterality: Left;  . [place] EXTRACTION  1994   Social History   Socioeconomic History  . Marital status: Married    Spouse name: Not on file  . Number of children: Not on file  . Years of education: Not on file  . Highest education  level: Not on file  Social Needs  . Financial resource strain: Not on file  . Food insecurity - worry: Not on file  . Food insecurity - inability: Not on file  . Transportation needs - medical: Not on file  . Transportation needs - non-medical: Not on file  Occupational History  . Not on file  Tobacco Use  . Smoking status: Never Smoker  . Smokeless tobacco: Never Used  Substance and Sexual Activity  . Alcohol use: Yes    Comment: socially  . Drug use: Not on file  . Sexual activity: Not on file  Other Topics Concern  . Not on file  Social History Narrative  . Not on file   Allergies  Allergen Reactions  . Fluticasone-Salmeterol Other (See Comments)    REACTION: jaw joint pain   Family History  Problem Relation Age of Onset  . Asthma Father   . Other Father        bronchitis     Past medical history, social, surgical and family history all reviewed in electronic medical record.  No pertanent information unless stated regarding to the chief complaint.   Review of Systems:Review of systems updated and as accurate as of 07/28/17  No headache, visual changes, nausea, vomiting, diarrhea, constipation, dizziness, abdominal pain, skin rash, fevers, chills, night sweats, weight loss, swollen lymph nodes, body aches, joint swelling, muscle aches, chest pain, shortness of breath, mood changes.   Objective  There were no vitals taken for this visit. Systems  examined below as of 07/28/17   General: No apparent distress alert and oriented x3 mood and affect normal, dressed appropriately.  HEENT: Pupils equal, extraocular movements intact  Respiratory: Patient's speak in full sentences and does not appear short of breath  Cardiovascular: No lower extremity edema, non tender, no erythema  Skin: Warm dry intact with no signs of infection or rash on extremities or on axial skeleton.  Abdomen: Soft nontender  Neuro: Cranial nerves II through XII are intact, neurovascularly intact in  all extremities with 2+ DTRs and 2+ pulses.  Lymph: No lymphadenopathy of posterior or anterior cervical chain or axillae bilaterally.  Gait normal with good balance and coordination.  MSK:  Non tender with full range of motion and good stability and symmetric strength and tone of shoulders, elbows, wrist, hip, knee and ankles bilaterally.     Impression and Recommendations:     This case required medical decision making of moderate complexity.      Note: This dictation was prepared with Dragon dictation along with smaller phrase technology. Any transcriptional errors that result from this process are unintentional.

## 2017-07-29 ENCOUNTER — Ambulatory Visit: Payer: Self-pay | Admitting: Family Medicine

## 2017-08-06 ENCOUNTER — Other Ambulatory Visit: Payer: Self-pay | Admitting: Internal Medicine

## 2017-08-18 ENCOUNTER — Telehealth: Payer: Self-pay | Admitting: Internal Medicine

## 2017-08-18 ENCOUNTER — Other Ambulatory Visit: Payer: Self-pay | Admitting: Internal Medicine

## 2017-08-18 MED ORDER — IPRATROPIUM-ALBUTEROL 0.5-2.5 (3) MG/3ML IN SOLN: RESPIRATORY_TRACT | 2 refills | Status: DC

## 2017-08-18 NOTE — Progress Notes (Signed)
Patient was supposed to get a 90 day supply of the duoneb. Sending new prescription to pharmacy.

## 2017-08-18 NOTE — Telephone Encounter (Signed)
Spoke with patients wife, sending new prescription for 1080 ml of duoneb to [facility] at [facility]. Nothing further needed.

## 2017-09-05 ENCOUNTER — Other Ambulatory Visit: Payer: Self-pay | Admitting: Internal Medicine

## 2017-09-05 NOTE — Telephone Encounter (Signed)
Ok to refill total 6 months 

## 2017-09-05 NOTE — Telephone Encounter (Signed)
Called and spoke with pt's spouse, Claiborne Billings. Claiborne Billings is requesting refill on Tranxene 7.5. Last refilled on 04/09/17 #60 with 5 refills.  Pt last seen 04/09/17 with pending apt on 10/03/17. preferred [facility] is [facility]. Claiborne Billings is aware that CY has left for the afternoon, but will be back in office on 09/08/17.  Claiborne Billings was okay with waiting.  CY please advise on refill. Thanks  Current Outpatient Medications on File Prior to Visit  Medication Sig Dispense Refill  . albuterol (VENTOLIN HFA) 108 (90 Base) MCG/ACT inhaler Inhale 2 puffs into the lungs every 6 (six) hours as needed for wheezing or shortness of breath. 1 Inhaler 11  . amoxicillin-clavulanate (AUGMENTIN) 875-125 MG tablet Take 1 tablet by mouth 2 (two) times daily. 14 tablet 0  . budesonide-formoterol (SYMBICORT) 160-4.5 MCG/ACT inhaler INHALE 2 PUFFS TWICE DAILY  , RINSE MOUTH AFTER USE 1 Inhaler 11  . clorazepate (TRANXENE) 7.5 MG tablet TAKE 1 TABLET BY MOUTH TWICE A DAY AS NEEDED 60 tablet 5  . doxycycline (VIBRA-TABS) 100 MG tablet Take 2 the today, then one daily until completed 8 tablet 0  . fluconazole (DIFLUCAN) 100 MG tablet Take 1 tablet (100 mg total) by mouth daily as needed. 60 tablet 1  . fluticasone (FLONASE) 50 MCG/ACT nasal spray Place 2 sprays into both nostrils daily. 16 g 11  . ipratropium-albuterol (DUONEB) 0.5-2.5 (3) MG/3ML SOLN INHALE THREE MILLILITERS VIA NEBULIZATION BY MOUTH FOUR TIMES A DAY AS NEEDED 1080 mL 2  . montelukast (SINGULAIR) 10 MG tablet 1 daily 90 tablet 3  . Nebulizers (COMPRESSOR NEBULIZER) MISC 1 Device by Does not apply route 4 (four) times daily as needed. 1 each 0  . predniSONE (DELTASONE) 5 MG tablet TAKE 3 TABLETS BY MOUTH  DAILY OR AS DIRECTED 90 tablet 11  . promethazine-codeine (PHENERGAN WITH CODEINE) 6.25-10 MG/5ML syrup Take 5 mLs by mouth every 6 (six) hours as needed for cough. 200 mL 0  . promethazine-codeine (PHENERGAN WITH CODEINE) 6.25-10 MG/5ML syrup Take 5 mLs by mouth  every 6 (six) hours as needed for cough. 200 mL 0   No current facility-administered medications on file prior to visit.     Allergies  Allergen Reactions  . Fluticasone-Salmeterol Other (See Comments)    REACTION: jaw joint pain

## 2017-09-08 MED ORDER — CLORAZEPATE DIPOTASSIUM 7.5 MG PO TABS: ORAL_TABLET | ORAL | 5 refills | Status: DC

## 2017-09-08 MED ORDER — CLORAZEPATE DIPOTASSIUM 7.5 MG PO TABS: ORAL_TABLET | ORAL | 1 refills | Status: DC

## 2017-09-08 NOTE — Telephone Encounter (Signed)
Medication refill printed and placed on CY desk.

## 2017-09-08 NOTE — Telephone Encounter (Signed)
rx called in to preferred pharmacy.  Pt's spouse aware.  Nothing further needed.

## 2017-10-03 ENCOUNTER — Encounter: Payer: Self-pay | Admitting: Internal Medicine

## 2017-10-03 ENCOUNTER — Ambulatory Visit (INDEPENDENT_AMBULATORY_CARE_PROVIDER_SITE_OTHER): Payer: BLUE CROSS/BLUE SHIELD | Admitting: Internal Medicine

## 2017-10-03 VITALS — BP 124/72 | HR 109 | Ht 70.0 in | Wt 286.4 lb

## 2017-10-03 DIAGNOSIS — J320 Chronic maxillary sinusitis: Secondary | ICD-10-CM

## 2017-10-03 DIAGNOSIS — J449 Chronic obstructive pulmonary disease, unspecified: Secondary | ICD-10-CM | POA: Diagnosis not present

## 2017-10-03 MED ORDER — FLUTICASONE-UMECLIDIN-VILANT 100-62.5-25 MCG/INH IN AEPB: 1.0000 | INHALATION_SPRAY | Freq: Every day | RESPIRATORY_TRACT | 0 refills | Status: DC

## 2017-10-03 NOTE — Patient Instructions (Addendum)
Order- Sputum C&S  Routine, fungal, AFB    Dx chronic obstructive asthma  Sample Trelegy inhaler    Inhale 1 puff, then rinse mouth, well, once daily   Try this instead of Symbicort. When the sample runs out, go back to Symbicort for comparison.  Mucinex

## 2017-10-03 NOTE — Progress Notes (Signed)
Patient ID: [name] [name], male    DOB: Jun 06, 1966, 52 y.o.   MRN: [id number]  HPI male never smoker followed for chronic obstructive asthma, OSA/quit CPAP, rhinitis/chronic sinusitis Unattended Home Sleep Test 02/09/15- mild OSA/ AHI 9.3/ hr, desat to 85%, weight 276 lbs PFT 03/06/2007 PFT 01/29/2011 Office Spirometry 02/03/2014-severe obstructive airways disease with low FVC.  FVC 3.13/62%, FEV1 2.01/50%, ratio 0.64,  FEF 25-75% 0.87/22% CT sinus 11/23/2009-moderate chronic appearing pansinusitis. 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 FVC 2.66/53%, FEV1 2.0/51%, ratio 0.75, FEF 25-75% 1.60/46% --------------------------------------------------------------------------------------  04/09/17- 52 year old male never smoker followed for chronic obstructive asthma, OSA/quit CPAP, rhinitis/chronic polypoid sinusitis, Continues prednisone maintenance 15 mg daily. He couldn't stay stable on 10 mg. Humidity was very hard on him this summer much less work stress now. Asked change his prescriptions to 1 month rather than mail-order. Still uses clorazepate to help with stress. Pt is doing well overall. CXR 10/02/16 IMPRESSION: 1. Mild bibasilar airspace opacities most likely represent atelectasis or scarring in the setting of low lung volumes. 2. No other acute cardiopulmonary disease.  10/03/17- 52 year old male never smoker followed for chronic obstructive asthma, OSA/quit CPAP, rhinitis/chronic polypoid sinusitis, ----OSA and Asthma with bronchitis; Pt states he continues to hvae congestion and SOB with exertion Prednisone 15 mg daily, Singulair, DuoNeb, Symbicort 160, albuterol HFA, Flonase 2 distinct chest colds this winter.  Has not been able to maintain clear feeling chest and cough remains productive-white, light yellow or slightly green with no blood.   Nebulizer is being helpful, used twice daily, up to 4 times daily.  Symbicort has been best inhaler.  Now on light duty at work after injuring left arm, followed by orthopedics.  Review of Systems-See HPI  + = positive Constitutional:    No- night sweats ,fevers, chills, fatigue, lassitude. HEENT:   No - headaches,  Difficulty swallowing, Tooth/dental problems, Sore throat,                No sneezing, itching, ear ache, CV:  No chest pain,  Orthopnea, PND, swelling in lower extremities, anasarca, dizziness, palpitations GI  No heartburn, indigestion, abdominal pain, nausea, vomiting,  Resp:.See HPI   + productive cough   No coughing up of blood.  +change in color of mucus,                + wheeze Skin: no rash or lesions. GU: . MS:  No joint pain or swelling.  [name] Psych:   change in mood or affect.  depression and  anxiety.  No memory loss.  Today Objective:   Physical Exam General- Alert, Oriented, Affect-appropriate, Distress- none acute, + Obese / thick neck Skin- rash-none, lesions- none, excoriation- none Lymphadenopathy- none Head- atraumatic            Eyes- Gross vision intact, PERRLA, conjunctivae clear secretions            Ears- Hearing, canals normal            Nose- Clear, no-Septal dev, mucus, polyps, erosion, perforation             Throat- Mallampati  III- IV , mucosa clear , drainage- none, tonsils- atrophic. , Neck- flexible , trachea midline, no stridor , thyroid nl, carotid no bruit Chest - symmetrical excursion , unlabored           Heart/CV- RRR , no murmur , no gallop  , no rub,  nl s1 s2                           - JVD- none , edema- none, stasis changes- none, varices- none           Lung-  Wheeze+ coarse, clear, unlabored,   cough -None , dullness-none, rub- none           Chest wall-  Abd-  Br/ Gen/ Rectal- Not done, not indicated Extrem- cyanosis- none, clubbing, none, atrophy- none, strength- nl Neuro- grossly intact to observation

## 2017-10-05 NOTE — Assessment & Plan Note (Signed)
Only some nasal stuffiness now without discharge or headache.

## 2017-10-05 NOTE — Assessment & Plan Note (Signed)
Fixed obstructive asthma, steroid dependent.  He had previously refused to try Xolair-concerned about possible side effects.  I brought up available alternatives for discussion today and he will consider.   Plan-try sample Trelegy instead of Symbicort, Consider Nucala or [name]

## 2017-10-06 ENCOUNTER — Other Ambulatory Visit: Payer: Worker's Compensation

## 2017-10-06 DIAGNOSIS — J449 Chronic obstructive pulmonary disease, unspecified: Secondary | ICD-10-CM

## 2017-10-15 ENCOUNTER — Telehealth: Payer: Self-pay | Admitting: Internal Medicine

## 2017-10-15 MED ORDER — SULFAMETHOXAZOLE-TRIMETHOPRIM 800-160 MG PO TABS: 1.0000 | ORAL_TABLET | Freq: Two times a day (BID) | ORAL | 0 refills | Status: DC

## 2017-10-15 NOTE — Telephone Encounter (Signed)
Notes recorded by Lorane Gell, CMA on 10/15/2017 at 2:39 PM EDT Pt aware of results and Rx has been sent. Pharmacy: [facility] ------  Notes recorded by Deneise Lever, MD on 10/14/2017 at 4:22 PM EDT Sputum culture- positive for MRSA ( methicillin- resistant staph). It is sensitive to sulfa drugs.    Order- Septra DS, # 20, 1 twice daily

## 2017-10-23 ENCOUNTER — Telehealth: Payer: Self-pay | Admitting: Internal Medicine

## 2017-10-23 MED ORDER — IPRATROPIUM-ALBUTEROL 0.5-2.5 (3) MG/3ML IN SOLN: RESPIRATORY_TRACT | 3 refills | Status: DC

## 2017-10-23 MED ORDER — SULFAMETHOXAZOLE-TRIMETHOPRIM 800-160 MG PO TABS: 1.0000 | ORAL_TABLET | Freq: Two times a day (BID) | ORAL | 0 refills | Status: DC

## 2017-10-23 NOTE — Telephone Encounter (Signed)
Called and spoke with patient Advised pt of CY  Recommendations of repeating Septra script again [facility] [place] Nothing further needed at this time.

## 2017-10-23 NOTE — Telephone Encounter (Signed)
Called and spoke to pt. Pt states he has continued taking the Septra and is scheduled to take his last dose on 4.5.19 evening (see phone note from 3.27.19). Pt states he feels some improved but is still producing thick yellow to green mucus each day.Pt questioning if he needs to extend the Septra because of the discolored mucus.   Pt also requested a printed rx for Duoneb, this has been printed and placed on CY's cart to be signed. Pt wants to pick this up on 4.5.2019 to take to different pharmacies to get best price.   Dr. Annamaria Boots please advise on pt's productive cough. Thanks.  Allergies  Allergen Reactions  . Fluticasone-Salmeterol Other (See Comments)    REACTION: jaw joint pain    Current Outpatient Medications on File Prior to Visit  Medication Sig Dispense Refill  . albuterol (VENTOLIN HFA) 108 (90 Base) MCG/ACT inhaler Inhale 2 puffs into the lungs every 6 (six) hours as needed for wheezing or shortness of breath. 1 Inhaler 11  . budesonide-formoterol (SYMBICORT) 160-4.5 MCG/ACT inhaler INHALE 2 PUFFS TWICE DAILY  , RINSE MOUTH AFTER USE 1 Inhaler 11  . clorazepate (TRANXENE) 7.5 MG tablet TAKE 1 TABLET BY MOUTH TWICE A DAY AS NEEDED 60 tablet 5  . fluticasone (FLONASE) 50 MCG/ACT nasal spray Place 2 sprays into both nostrils daily. 16 g 11  . Fluticasone-Umeclidin-Vilant (TRELEGY ELLIPTA) 100-62.5-25 MCG/INH AEPB Inhale 1 puff into the lungs daily. 1 each 0  . montelukast (SINGULAIR) 10 MG tablet 1 daily 90 tablet 3  . Nebulizers (COMPRESSOR NEBULIZER) MISC 1 Device by Does not apply route 4 (four) times daily as needed. 1 each 0  . predniSONE (DELTASONE) 5 MG tablet TAKE 3 TABLETS BY MOUTH  DAILY OR AS DIRECTED 90 tablet 11  . sulfamethoxazole-trimethoprim (BACTRIM DS,SEPTRA DS) 800-160 MG tablet Take 1 tablet by mouth 2 (two) times daily. 20 tablet 0   No current facility-administered medications on file prior to visit.

## 2017-10-23 NOTE — Telephone Encounter (Signed)
Ok to repeat the [place]

## 2017-11-04 ENCOUNTER — Telehealth: Payer: Self-pay | Admitting: Internal Medicine

## 2017-11-04 DIAGNOSIS — J449 Chronic obstructive pulmonary disease, unspecified: Secondary | ICD-10-CM

## 2017-11-04 DIAGNOSIS — J45909 Unspecified asthma, uncomplicated: Secondary | ICD-10-CM

## 2017-11-04 NOTE — Telephone Encounter (Signed)
Called and spoke with patient, he states that he is still having symptoms. He also feels like he has the flu. I offered the patient to come in to be seen but he does not want to do that. He would rather give the samples in the lab. Order placed. Patient will be in tomorrow to have this done. Nothing further needed.

## 2017-11-04 NOTE — Telephone Encounter (Signed)
Called and spoke with patient, he states that CY placed him on two antibiotics due to testing positive for mersa. Patient is finished with the antibiotics and wants to know if he needs to come back in to be tested to see if they worked or if he is still positive for it.   CY please advise, thanks.   Current Outpatient Medications on File Prior to Visit  Medication Sig Dispense Refill  . albuterol (VENTOLIN HFA) 108 (90 Base) MCG/ACT inhaler Inhale 2 puffs into the lungs every 6 (six) hours as needed for wheezing or shortness of breath. 1 Inhaler 11  . budesonide-formoterol (SYMBICORT) 160-4.5 MCG/ACT inhaler INHALE 2 PUFFS TWICE DAILY  , RINSE MOUTH AFTER USE 1 Inhaler 11  . clorazepate (TRANXENE) 7.5 MG tablet TAKE 1 TABLET BY MOUTH TWICE A DAY AS NEEDED 60 tablet 5  . fluticasone (FLONASE) 50 MCG/ACT nasal spray Place 2 sprays into both nostrils daily. 16 g 11  . Fluticasone-Umeclidin-Vilant (TRELEGY ELLIPTA) 100-62.5-25 MCG/INH AEPB Inhale 1 puff into the lungs daily. 1 each 0  . ipratropium-albuterol (DUONEB) 0.5-2.5 (3) MG/3ML SOLN INHALE THREE MILLILITERS VIA NEBULIZATION BY MOUTH FOUR TIMES A DAY AS NEEDED 1080 mL 3  . montelukast (SINGULAIR) 10 MG tablet 1 daily 90 tablet 3  . Nebulizers (COMPRESSOR NEBULIZER) MISC 1 Device by Does not apply route 4 (four) times daily as needed. 1 each 0  . predniSONE (DELTASONE) 5 MG tablet TAKE 3 TABLETS BY MOUTH  DAILY OR AS DIRECTED 90 tablet 11  . sulfamethoxazole-trimethoprim (BACTRIM DS,SEPTRA DS) 800-160 MG tablet Take 1 tablet by mouth 2 (two) times daily. 20 tablet 0   No current facility-administered medications on file prior to visit.    Allergies  Allergen Reactions  . Fluticasone-Salmeterol Other (See Comments)    REACTION: jaw joint pain

## 2017-11-04 NOTE — Telephone Encounter (Signed)
Did cough and chest symptoms improve with antibiotics? If not, please order repeat sputum C&S for routine, fungal and AFB        Dx  asthmatic bronchitis moderate persistent

## 2017-11-05 ENCOUNTER — Other Ambulatory Visit: Payer: Worker's Compensation

## 2017-11-06 ENCOUNTER — Other Ambulatory Visit: Payer: Worker's Compensation

## 2017-11-06 DIAGNOSIS — J45909 Unspecified asthma, uncomplicated: Secondary | ICD-10-CM | POA: Diagnosis not present

## 2017-11-12 LAB — RESPIRATORY CULTURE OR RESPIRATORY AND SPUTUM CULTURE
MICRO NUMBER:: 90338648
SPECIMEN QUALITY:: ADEQUATE

## 2017-11-12 LAB — FUNGUS CULTURE W SMEAR
MICRO NUMBER: 90338647
SMEAR:: NONE SEEN
SPECIMEN QUALITY:: ADEQUATE

## 2017-11-20 ENCOUNTER — Telehealth: Payer: Self-pay | Admitting: Internal Medicine

## 2017-11-20 LAB — MYCOBACTERIA,CULT W/FLUOROCHROME SMEAR
MICRO NUMBER: 90338642
SMEAR:: NONE SEEN
SPECIMEN QUALITY: ADEQUATE

## 2017-11-20 NOTE — Telephone Encounter (Signed)
Sputum culture from 4/18 is now just growing normal airway bacteria, which is what we want to see.  Glad he feels better.

## 2017-11-20 NOTE — Telephone Encounter (Signed)
Called and spoke with pt who stated he is wanting to know the results of the second sputum culture.  After pt finished two rounds of abx, pt states he has felt the best now than he had in the last 5 years. States he has a whole lot of energy now.  Pt also stated he hardly has any mucus now.  Dr. Annamaria Boots, please advise results of pt's recent sputum culture. Thanks!

## 2017-11-20 NOTE — Telephone Encounter (Signed)
Called and spoke with pt letting him know the results of the sputum culture.  Pt expressed understanding. Nothing further needed at this time.

## 2017-12-24 LAB — RESPIRATORY CULTURE OR RESPIRATORY AND SPUTUM CULTURE
MICRO NUMBER: 90478447
RESULT:: NORMAL
SPECIMEN QUALITY:: ADEQUATE

## 2017-12-24 LAB — FUNGUS CULTURE W SMEAR
MICRO NUMBER:: 90478445
SMEAR:: NONE SEEN
SPECIMEN QUALITY:: ADEQUATE

## 2017-12-24 LAB — MYCOBACTERIA,CULT W/FLUOROCHROME SMEAR
MICRO NUMBER:: 90478446
SMEAR: NONE SEEN
SPECIMEN QUALITY: ADEQUATE

## 2017-12-29 ENCOUNTER — Ambulatory Visit (INDEPENDENT_AMBULATORY_CARE_PROVIDER_SITE_OTHER)
Admission: RE | Admit: 2017-12-29 | Discharge: 2017-12-29 | Disposition: A | Discharge status: Home / Self Care | Payer: BLUE CROSS/BLUE SHIELD | Source: Ambulatory Visit | Attending: Adult Health | Admitting: Adult Health

## 2017-12-29 ENCOUNTER — Encounter: Payer: Self-pay | Admitting: Adult Health

## 2017-12-29 ENCOUNTER — Encounter: Payer: Self-pay | Admitting: *Deleted

## 2017-12-29 ENCOUNTER — Other Ambulatory Visit: Payer: BLUE CROSS/BLUE SHIELD

## 2017-12-29 ENCOUNTER — Ambulatory Visit (INDEPENDENT_AMBULATORY_CARE_PROVIDER_SITE_OTHER): Payer: BLUE CROSS/BLUE SHIELD | Admitting: Adult Health

## 2017-12-29 VITALS — BP 136/86 | HR 101 | Temp 98.6°F | Ht 70.0 in | Wt 285.2 lb

## 2017-12-29 DIAGNOSIS — B3781 Candidal esophagitis: Secondary | ICD-10-CM | POA: Diagnosis not present

## 2017-12-29 DIAGNOSIS — B370 Candidal stomatitis: Secondary | ICD-10-CM

## 2017-12-29 DIAGNOSIS — J320 Chronic maxillary sinusitis: Secondary | ICD-10-CM

## 2017-12-29 DIAGNOSIS — J449 Chronic obstructive pulmonary disease, unspecified: Secondary | ICD-10-CM

## 2017-12-29 DIAGNOSIS — R05 Cough: Secondary | ICD-10-CM | POA: Diagnosis not present

## 2017-12-29 MED ORDER — SULFAMETHOXAZOLE-TRIMETHOPRIM 800-160 MG PO TABS: 1.0000 | ORAL_TABLET | Freq: Two times a day (BID) | ORAL | 0 refills | Status: DC

## 2017-12-29 MED ORDER — PREDNISONE 10 MG PO TABS: ORAL_TABLET | ORAL | 0 refills | Status: DC

## 2017-12-29 MED ORDER — CLOTRIMAZOLE 10 MG MT TROC: 10.0000 mg | Freq: Every day | OROMUCOSAL | 0 refills | Status: DC

## 2017-12-29 MED ORDER — BENZONATATE 200 MG PO CAPS: 200.0000 mg | ORAL_CAPSULE | Freq: Three times a day (TID) | ORAL | 1 refills | Status: DC | PRN

## 2017-12-29 NOTE — Addendum Note (Signed)
Addended by: Len Blalock on: 12/29/2017 10:45 AM   Modules accepted: Orders

## 2017-12-29 NOTE — Patient Instructions (Addendum)
Begin Bactrim DS Twice daily  For 10 days  Mucinex DM Twice daily  As needed  Cough /congestion  Tessalon Three times a day  As needed  Cough .  Prednisone taper over the next week then decrease to 15 mg daily and hold at this dose Saline nasal rinses Follow-up with ENT tomorrow as planned Increase Symbicort to 2 puffs twice daily Mycelex troches 5 times daily for 1 week Mouth care after Symbicort as discussed Sputum culture today Follow-up with Dr. Annamaria Boots in 6 weeks and as needed Please contact office for sooner follow up if symptoms do not improve or worsen or seek emergency care

## 2017-12-29 NOTE — Progress Notes (Signed)
@Patient  ID: [name] [name], male    DOB: March 16, 1966, 52 y.o.   MRN: [id number]  Chief Complaint  Patient presents with  . Acute Visit    asthma     Referring provider: No ref. provider found  HPI: 52 year old male never smoker followed for chronic obstructive asthma and chronic polypoid sinusitis  TEST  Office Spirometry 02/03/2014-severe obstructive airways disease with low FVC.  FVC 3.13/62%, FEV1 2.01/50%, ratio 0.64,  FEF 25-75% 0.87/22% CT sinus 11/23/2009-moderate chronic appearing pansinusitis. 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 FVC 2.66/53%, FEV1 2.0/51%, ratio 0.75, FEF 25-75% 1.60/46%   12/29/2017 Acute OV : Asthma  Pt presents for an acute office visit. He complains of 1-2 weeks of sinus congestion , drainage , pressure, ear fullness, fever and productive cough . Has increased nasal drainage out of left nare, says it is foul smell. Decreased energy . Increased albuterol inhaler and neb more.  Remains of symbicort , says he is only been using once a day. On chronic steroids for asthma - prednisone 15mg  daily .  Has been using saline sinus rinses .  CXR today shows bronchitic changes.   Previous sputum cx in 09/2017 , positive for MRSA , tx w/ 10d  of Bacrtim DS .   Follows with ENT , Dr. Erik Obey  Has ov with them tomorrow.    Allergies  Allergen Reactions  . Fluticasone-Salmeterol Other (See Comments)    REACTION: jaw joint pain    Immunization History  Administered Date(s) Administered  . Influenza Split 06/19/2011, 07/22/2012, 06/21/2013  . Influenza Whole 04/21/2008  . Influenza,inj,Quad PF,6+ Mos 04/11/2015, 05/23/2016, 04/09/2017  . Pneumococcal Polysaccharide-23 07/27/2014    Past Medical History:  Diagnosis Date  . Allergic rhinitis   . Complication of anesthesia    difficulty waking up from lithotripsy and  nasal surgery  . History of chronic bronchitis   . History of kidney stones   . Left ureteral stone   . Mild obstructive sleep apnea    per study 02-09-2015  recommended oral appliance / cpap-- pt tried intolerant  . Moderate persistent asthma    pulmologist-  dr young    Tobacco History: Social History   Tobacco Use  Smoking Status Never Smoker  Smokeless Tobacco Never Used   Counseling given: Not Answered   Outpatient Encounter Medications as of 12/29/2017  Medication Sig  . albuterol (VENTOLIN HFA) 108 (90 Base) MCG/ACT inhaler Inhale 2 puffs into the lungs every 6 (six) hours as needed for wheezing or shortness of breath.  . budesonide-formoterol (SYMBICORT) 160-4.5 MCG/ACT inhaler INHALE 2 PUFFS TWICE DAILY  , RINSE MOUTH AFTER USE  . clorazepate (TRANXENE) 7.5 MG tablet TAKE 1 TABLET BY MOUTH TWICE A DAY AS NEEDED  . cyclobenzaprine (FEXMID) 7.5 MG tablet Take 7.5 mg by mouth 3 (three) times daily as needed for muscle spasms.  . fluticasone (FLONASE) 50 MCG/ACT nasal spray Place 2 sprays into both nostrils daily.  [name] ibuprofen (ADVIL,MOTRIN) 800 MG tablet Take 800 mg by mouth every 8 (eight) hours as needed.  [name] ipratropium-albuterol (DUONEB) 0.5-2.5 (3) MG/3ML SOLN INHALE THREE MILLILITERS VIA NEBULIZATION BY MOUTH FOUR TIMES A DAY AS NEEDED  . montelukast (SINGULAIR) 10 MG tablet 1 daily  . Nebulizers (COMPRESSOR NEBULIZER) MISC 1 Device by Does not apply route 4 (four) times daily as needed.  . predniSONE (DELTASONE) 5 MG tablet TAKE 3 TABLETS BY MOUTH  DAILY OR  AS DIRECTED  . benzonatate (TESSALON) 200 MG capsule Take 1 capsule (200 mg total) by mouth 3 (three) times daily as needed for cough.  . clotrimazole (MYCELEX) 10 MG troche Take 1 tablet (10 mg total) by mouth 5 (five) times daily.  . predniSONE (DELTASONE) 10 MG tablet 4 tabs for 2 days, then 3 tabs for 2 days, 2 tabs for 2 days, then back to 15mg  daily  . sulfamethoxazole-trimethoprim (BACTRIM DS) 800-160 MG tablet  Take 1 tablet by mouth 2 (two) times daily.  . [DISCONTINUED] Fluticasone-Umeclidin-Vilant (TRELEGY ELLIPTA) 100-62.5-25 MCG/INH AEPB Inhale 1 puff into the lungs daily. (Patient not taking: Reported on 12/29/2017)  . [DISCONTINUED] sulfamethoxazole-trimethoprim (BACTRIM DS,SEPTRA DS) 800-160 MG tablet Take 1 tablet by mouth 2 (two) times daily. (Patient not taking: Reported on 12/29/2017)   No facility-administered encounter medications on file as of 12/29/2017.      Review of Systems  Constitutional:   No  weight loss, night sweats,  Fevers, chills, fatigue, or  lassitude.  HEENT:   No headaches,  Difficulty swallowing,  Tooth/dental problems, or  Sore throat,                No sneezing, itching, ear ache, nasal congestion, post nasal drip,   CV:  No chest pain,  Orthopnea, PND, swelling in lower extremities, anasarca, dizziness, palpitations, syncope.   GI  No heartburn, indigestion, abdominal pain, nausea, vomiting, diarrhea, change in bowel habits, loss of appetite, bloody stools.   Resp: No shortness of breath with exertion or at rest.  No excess mucus, no productive cough,  No non-productive cough,  No coughing up of blood.  No change in color of mucus.  No wheezing.  No chest wall deformity  Skin: no rash or lesions.  GU: no dysuria, change in color of urine, no urgency or frequency.  No flank pain, no hematuria   MS:  No joint pain or swelling.  No decreased range of motion.  No back pain.    Physical Exam  BP 136/86 (BP Location: Left Arm, Cuff Size: Large)   Pulse (!) 101   Temp 98.6 F (37 C) (Oral)   Ht 5\' 10"  (1.778 m)   Wt 285 lb 3.2 oz (129.4 kg)   SpO2 92%   BMI 40.92 kg/m   GEN: A/Ox3; pleasant , NAD, obese    HEENT:  [place]/AT,  EACs-L clear , Right partial cerumen impaction , TMs-wnl on left NOSE-clear drainage , max sinus tenderness , THROAT-scattered white patches along cheeks and tongue  no postnasal drip or exudate noted.   NECK:  Supple w/ fair ROM; no  JVD; normal carotid impulses w/o bruits; no thyromegaly or nodules palpated; no lymphadenopathy.    RESP  Scattered rhonchi , no  accessory muscle use, no dullness to percussion  CARD:  RRR, no m/r/g, tr peripheral edema, pulses intact, no cyanosis or clubbing.  GI:   Soft & nt; nml bowel sounds; no organomegaly or masses detected.   Musco: Warm bil, no deformities or joint swelling noted.   Neuro: alert, no focal deficits noted.    Skin: Warm, no lesions or rashes    Lab Results:  CBC   Imaging: Dg Chest 2 View  Result Date: 12/29/2017 CLINICAL DATA:  Cough, congestion, fever EXAM: CHEST - 2 VIEW COMPARISON:  10/02/2016 FINDINGS: Heart and mediastinal contours are within normal limits. No focal opacities or effusions. No acute bony abnormality. Mild peribronchial thickening. IMPRESSION: Mild bronchitic changes. Electronically Signed  By: Rolm Baptise M.D.   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  Prednisone taper over the next week then decrease to 15 mg daily and hold at this dose Saline nasal rinses Follow-up with ENT tomorrow as planned Increase Symbicort to 2 puffs twice daily Mycelex troches 5 times daily for 1 week Mouth care after Symbicort as discussed Sputum culture today Follow-up with Dr. Annamaria Boots in 6 weeks and as needed Please contact office for sooner follow up if symptoms do not improve or worsen or seek emergency care       Thrush of mouth and esophagus Oral care discussed  mycelex x 1 week  Please contact office for sooner follow up if symptoms do not improve or worsen or seek emergency care     Sinusitis, maxillary, chronic Flare   Plan  Patient Instructions    Begin Bactrim DS Twice daily  For 10 days  Mucinex DM Twice daily  As needed  Cough /congestion  Tessalon Three times a day  As needed  Cough .  Prednisone taper over the next week then decrease to 15 mg daily and hold at this dose Saline nasal rinses Follow-up with ENT tomorrow as planned Increase Symbicort to 2 puffs twice daily Mycelex troches 5 times daily for 1 week Mouth care after Symbicort as discussed Sputum culture today Follow-up with Dr. Annamaria Boots in 6 weeks and as needed Please contact office for sooner follow up if symptoms do not improve or worsen or seek emergency care          Rexene Edison, NP 12/29/2017

## 2017-12-29 NOTE — Assessment & Plan Note (Signed)
Flare   Plan  Patient Instructions  Begin Bactrim DS Twice daily  For 10 days  Mucinex DM Twice daily  As needed  Cough /congestion  Tessalon Three times a day  As needed  Cough .  Prednisone taper over the next week then decrease to 15 mg daily and hold at this dose Saline nasal rinses Follow-up with ENT tomorrow as planned Increase Symbicort to 2 puffs twice daily Mycelex troches 5 times daily for 1 week Mouth care after Symbicort as discussed Sputum culture today Follow-up with Dr. Annamaria Boots in 6 weeks and as needed Please contact office for sooner follow up if symptoms do not improve or worsen or seek emergency care

## 2017-12-29 NOTE — Assessment & Plan Note (Signed)
Flare with Bronchitis /Sinusitis   Plan  Patient Instructions  Begin Bactrim DS Twice daily  For 10 days  Mucinex DM Twice daily  As needed  Cough /congestion  Tessalon Three times a day  As needed  Cough .  Prednisone taper over the next week then decrease to 15 mg daily and hold at this dose Saline nasal rinses Follow-up with ENT tomorrow as planned Increase Symbicort to 2 puffs twice daily Mycelex troches 5 times daily for 1 week Mouth care after Symbicort as discussed Sputum culture today Follow-up with Dr. Annamaria Boots in 6 weeks and as needed Please contact office for sooner follow up if symptoms do not improve or worsen or seek emergency care

## 2017-12-29 NOTE — Assessment & Plan Note (Signed)
Oral care discussed  mycelex x 1 week  Please contact office for sooner follow up if symptoms do not improve or worsen or seek emergency care

## 2018-01-01 LAB — RESPIRATORY CULTURE OR RESPIRATORY AND SPUTUM CULTURE
MICRO NUMBER: 90693571
SPECIMEN QUALITY: ADEQUATE

## 2018-02-09 ENCOUNTER — Encounter: Payer: Self-pay | Admitting: Internal Medicine

## 2018-02-09 ENCOUNTER — Ambulatory Visit (INDEPENDENT_AMBULATORY_CARE_PROVIDER_SITE_OTHER): Payer: BLUE CROSS/BLUE SHIELD | Admitting: Internal Medicine

## 2018-02-09 DIAGNOSIS — J449 Chronic obstructive pulmonary disease, unspecified: Secondary | ICD-10-CM

## 2018-02-09 MED ORDER — FLUTICASONE-UMECLIDIN-VILANT 100-62.5-25 MCG/INH IN AEPB: 1.0000 | INHALATION_SPRAY | Freq: Every day | RESPIRATORY_TRACT | 0 refills | Status: DC

## 2018-02-09 NOTE — Progress Notes (Signed)
Patient ID: [name] [name], male    DOB: [birth year], 52 y.o.   MRN: [id number]  HPI male never smoker followed for chronic obstructive asthma, OSA/quit CPAP, rhinitis/chronic sinusitis Unattended Home Sleep Test 02/09/15- mild OSA/ AHI 9.3/ hr, desat to 85%, weight 276 lbs PFT 03/06/2007 PFT 01/29/2011 Office Spirometry 02/03/2014-severe obstructive airways disease with low FVC.  FVC 3.13/62%, FEV1 2.01/50%, ratio 0.64,  FEF 25-75% 0.87/22% CT sinus 11/23/2009-moderate chronic appearing pansinusitis. 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 FVC 2.66/53%, FEV1 2.0/51%, ratio 0.75, FEF 25-75% 1.60/46% --------------------------------------------------------------------------------------  10/03/17- 52 year old male never smoker followed for chronic obstructive asthma, OSA/quit CPAP, rhinitis/chronic polypoid sinusitis, ----OSA and Asthma with bronchitis; Pt states he continues to hvae congestion and SOB with exertion Prednisone 15 mg daily, Singulair, DuoNeb, Symbicort 160, albuterol HFA, Flonase 2 distinct chest colds this winter.  Has not been able to maintain clear feeling chest and cough remains productive-white, light yellow or slightly green with no blood.  Nebulizer is being helpful, used twice daily, up to 4 times daily.  Symbicort has been best inhaler.  Now on light duty at work after injuring left arm, followed by orthopedics.  02/09/2018- 52 year old male never smoker followed for chronic obstructive asthma ( Asthma- COPD Overlap Syndrome), OSA/quit CPAP, rhinitis/chronic polypoid sinusitis, Was given prednisone taper and Bactrim DS at 6/10 visit. -----Asthma;Pt told to f/u per TP. Pt states he is feeling better as well.  Prednisone maintenance 15 mg, Singulair, DuoNeb, Symbicort 160, albuterol HFA, Flonase Blames "summer cold" for exacerbation last  month-much better now.  Never really tried Trelegy.  Asks if Bactrim cleared a very long-standing bacterial infection with MRSA.  Still paces himself, wheezes some most days especially with exertion.  Prednisone 2 or 3 x 5 mg tablets daily especially on work days. CXR 12/29/2017- IMPRESSION: Mild bronchitic changes.  //needs formal PFT//  Review of Systems-See HPI  + = positive Constitutional:    No- night sweats ,fevers, chills, fatigue, lassitude. HEENT:   No - headaches,  Difficulty swallowing, Tooth/dental problems, Sore throat,                No sneezing, itching, ear ache, CV:  No chest pain,  Orthopnea, PND, swelling in lower extremities, anasarca, dizziness, palpitations GI  No heartburn, indigestion, abdominal pain, nausea, vomiting,  Resp:.See HPI   + productive cough   No coughing up of blood.  +change in color of mucus,                + wheeze Skin: no rash or lesions. GU: . MS:  No joint pain or swelling.  [name] Psych:   change in mood or affect.  depression and  anxiety.  No memory loss.  Today Objective:   Physical Exam General- Alert, Oriented, Affect-appropriate, Distress- none acute, + Obese / thick neck Skin- rash-none, lesions- none, excoriation- none Lymphadenopathy- none Head- atraumatic            Eyes- Gross vision intact, PERRLA, conjunctivae clear secretions            Ears- Hearing, canals normal            Nose- Clear, no-Septal dev, mucus, polyps, erosion, perforation             Throat- Mallampati  III- IV , mucosa clear , drainage- none, tonsils- atrophic. , Neck- flexible , trachea midline, no stridor , thyroid nl, carotid no bruit Chest -  symmetrical excursion , unlabored           Heart/CV- RRR , no murmur , no gallop  , no rub, nl s1 s2                           - JVD- none , edema- none, stasis changes- none, varices- none           Lung-  Wheeze+/ distant, clear, unlabored,   cough -None , dullness-none, rub- none           Chest wall-  Abd-  Br/  Gen/ Rectal- Not done, not indicated Extrem- cyanosis- none, clubbing, none, atrophy- none, strength- nl Neuro- grossly intact to observation

## 2018-02-09 NOTE — Assessment & Plan Note (Signed)
He is probably near his baseline, never clear and steroid-dependent. He has been reluctant to try Biologics and had refused Xolair out of concern of potential side effects.  He cautiously agrees to try Trelegy now for discussion.

## 2018-02-09 NOTE — Patient Instructions (Signed)
Sample Trelegy inhaler   Inhale 1 puff, once daily, then rise mouth.   Try this instead of Symbicort. When the sample runs out, go back to Symbicort for comparison.   Please call as needed

## 2018-02-20 ENCOUNTER — Telehealth: Payer: Self-pay | Admitting: Internal Medicine

## 2018-02-20 MED ORDER — FLUTICASONE PROPIONATE 50 MCG/ACT NA SUSP: 2.0000 | Freq: Every day | NASAL | 11 refills | Status: DC

## 2018-02-20 NOTE — Telephone Encounter (Signed)
flonase refill sent to [facility] with Claiborne Billings and notified that this was done  Nothing further needed

## 2018-03-08 ENCOUNTER — Other Ambulatory Visit: Payer: Self-pay | Admitting: Internal Medicine

## 2018-03-09 NOTE — Telephone Encounter (Signed)
CY Please advise on refill. Thanks.  

## 2018-03-09 NOTE — Telephone Encounter (Signed)
Ok refill # 60, 1 twice daily as needed, ref x 5

## 2018-03-18 ENCOUNTER — Telehealth: Payer: Self-pay | Admitting: Internal Medicine

## 2018-03-18 NOTE — Telephone Encounter (Signed)
Called pharmacy to ensure Tranxene was available for pick up as it was called in on 03/11/2018. Per pharmacy prescription is ready and available for pick up. Called and spoke with patient's wife per DPR, made aware the prescription was ready for pick up. Voiced understanding. Nothing further needed at this time.

## 2018-03-26 ENCOUNTER — Other Ambulatory Visit: Payer: Self-pay | Admitting: Adult Health

## 2018-03-30 ENCOUNTER — Telehealth: Payer: Self-pay | Admitting: Internal Medicine

## 2018-03-30 MED ORDER — ALBUTEROL SULFATE HFA 108 (90 BASE) MCG/ACT IN AERS: 2.0000 | INHALATION_SPRAY | Freq: Four times a day (QID) | RESPIRATORY_TRACT | 5 refills | Status: DC | PRN

## 2018-03-30 MED ORDER — DOXYCYCLINE HYCLATE 100 MG PO TABS: 100.0000 mg | ORAL_TABLET | Freq: Two times a day (BID) | ORAL | 0 refills | Status: DC

## 2018-03-30 MED ORDER — FLUCONAZOLE 100 MG PO TABS: 100.0000 mg | ORAL_TABLET | Freq: Every day | ORAL | 0 refills | Status: DC

## 2018-03-30 MED ORDER — BUDESONIDE-FORMOTEROL FUMARATE 160-4.5 MCG/ACT IN AERO: INHALATION_SPRAY | RESPIRATORY_TRACT | 5 refills | Status: DC

## 2018-03-30 NOTE — Telephone Encounter (Signed)
Offer doxycycline 100 mg, # 20, 1 twice daily   For the sinus infection            Diflucan 100 mg, # 10, 1 daily  For thrush  Ok to refill his inhalers x 1 year

## 2018-03-30 NOTE — Telephone Encounter (Signed)
Called and spoke with Patient.  He stated that he is having a productive cough with thick yellow/white mucus.  He has been taking his mucinex, but feels he needs a antibiotic.  He is requesting Albuterol inhaler,Symbicort refills, and any other prescriptions printed.  He stated that he needed something for thrush, last time he was prescribed flucanzole 100mg .    Dr. Annamaria Boots please advise  Allergies  Allergen Reactions  . Fluticasone-Salmeterol Other (See Comments)    REACTION: jaw joint pain   Current Outpatient Medications on File Prior to Visit  Medication Sig Dispense Refill  . albuterol (VENTOLIN HFA) 108 (90 Base) MCG/ACT inhaler Inhale 2 puffs into the lungs every 6 (six) hours as needed for wheezing or shortness of breath. 1 Inhaler 11  . benzonatate (TESSALON) 200 MG capsule Take 1 capsule (200 mg total) by mouth 3 (three) times daily as needed for cough. 30 capsule 1  . budesonide-formoterol (SYMBICORT) 160-4.5 MCG/ACT inhaler INHALE 2 PUFFS TWICE DAILY  , RINSE MOUTH AFTER USE 1 Inhaler 11  . clorazepate (TRANXENE) 7.5 MG tablet TAKE 1 TABLET BY MOUTH TWICE A DAY AS NEEDED 60 tablet 5  . clotrimazole (MYCELEX) 10 MG troche Take 1 tablet (10 mg total) by mouth 5 (five) times daily. 35 tablet 0  . cyclobenzaprine (FEXMID) 7.5 MG tablet Take 7.5 mg by mouth 3 (three) times daily as needed for muscle spasms.    . fluticasone (FLONASE) 50 MCG/ACT nasal spray Place 2 sprays into both nostrils daily. 16 g 11  . Fluticasone-Umeclidin-Vilant (TRELEGY ELLIPTA) 100-62.5-25 MCG/INH AEPB Inhale 1 puff into the lungs daily. 1 each 0  . ibuprofen (ADVIL,MOTRIN) 800 MG tablet Take 800 mg by mouth every 8 (eight) hours as needed.    [name] ipratropium-albuterol (DUONEB) 0.5-2.5 (3) MG/3ML SOLN INHALE THREE MILLILITERS VIA NEBULIZATION BY MOUTH FOUR TIMES A DAY AS NEEDED 1080 mL 3  . montelukast (SINGULAIR) 10 MG tablet 1 daily 90 tablet 3  . Nebulizers (COMPRESSOR NEBULIZER) MISC 1 Device by Does not apply  route 4 (four) times daily as needed. 1 each 0  . predniSONE (DELTASONE) 5 MG tablet TAKE 3 TABLETS BY MOUTH  DAILY OR AS DIRECTED 90 tablet 11   No current facility-administered medications on file prior to visit.

## 2018-03-30 NOTE — Telephone Encounter (Signed)
Called and spoke with Patient.  He stated that he is having a productive cough with thick yellow/white mucus.  He has been taking his mucinex, but feels he needs a antibiotic.  He is requesting Albuterol inhaler,Symbicort refills, and any other prescriptions printed.  He stated that he needed something for thrush, last time he was prescribed flucanzole 100mg .    Dr. Annamaria Boots please advise

## 2018-03-30 NOTE — Telephone Encounter (Signed)
Patient had to leave and asked if someone could give him a call at [phone]

## 2018-03-30 NOTE — Telephone Encounter (Signed)
Pt is calling back he also need refill of Symbicort, Albuterol. Pt want's to pick up the scripts from [place] to shop around for cheaper prices. [phone]  or [phone]

## 2018-03-30 NOTE — Telephone Encounter (Signed)
Spoke with pt, advised Rx for Symbicort and albuterol was printed and placed up front. Pt understood, nothing further is needed.

## 2018-03-30 NOTE — Telephone Encounter (Signed)
ATC pt, no answer. Left message for pt to call back. I wanted to let pt know that his Rxs had been sent to his pharmacy.

## 2018-04-07 ENCOUNTER — Ambulatory Visit: Payer: Self-pay | Admitting: Internal Medicine

## 2018-04-12 ENCOUNTER — Other Ambulatory Visit: Payer: Self-pay | Admitting: Internal Medicine

## 2018-05-20 ENCOUNTER — Telehealth: Payer: Self-pay | Admitting: Internal Medicine

## 2018-05-20 MED ORDER — DOXYCYCLINE HYCLATE 100 MG PO TABS: 100.0000 mg | ORAL_TABLET | Freq: Two times a day (BID) | ORAL | 0 refills | Status: DC

## 2018-05-20 NOTE — Telephone Encounter (Signed)
Last office visit 7.22.19 w/ CDY with recs to follow up in Nov 2019 On Symbicort, Duoneb prn, flonase, Singulair, prednisone 15mg  daily  Called spoke with patient who believes that he has an upper respiratory infection with increased SOB, wheezing, chest congestion w/ prod cough with light- to medium yellow mucus sometimes mixed with green mucus x1 week.    Taking Mucinex x5-6 days without much relief  Denies any f/c/s, chest pain, hemoptysis, head congestion, PND  Patient has upcoming appt with CDY on 11.7.19 [facility]  Allergies  Allergen Reactions  . Fluticasone-Salmeterol Other (See Comments)    REACTION: jaw joint pain    Dr Annamaria Boots please advise, thank you.

## 2018-05-20 NOTE — Telephone Encounter (Signed)
Offer doxycycline 100 mg, # 14, 1 twice daily 

## 2018-05-20 NOTE — Telephone Encounter (Signed)
Called spoke with patient, advised of CY's recommendation as stated below Patient voiced his understanding Patient to keep upcoming appt with CY and to call if anything further is needed Rx sent to verified pharmacy  Nothing further needed at this time; will sign off

## 2018-05-28 ENCOUNTER — Encounter: Payer: Self-pay | Admitting: Internal Medicine

## 2018-05-28 ENCOUNTER — Ambulatory Visit (INDEPENDENT_AMBULATORY_CARE_PROVIDER_SITE_OTHER): Payer: BLUE CROSS/BLUE SHIELD | Admitting: Internal Medicine

## 2018-05-28 DIAGNOSIS — Z23 Encounter for immunization: Secondary | ICD-10-CM | POA: Diagnosis not present

## 2018-05-28 DIAGNOSIS — J449 Chronic obstructive pulmonary disease, unspecified: Secondary | ICD-10-CM | POA: Diagnosis not present

## 2018-05-28 DIAGNOSIS — B3781 Candidal esophagitis: Secondary | ICD-10-CM | POA: Diagnosis not present

## 2018-05-28 DIAGNOSIS — B370 Candidal stomatitis: Secondary | ICD-10-CM

## 2018-05-28 MED ORDER — PREDNISONE 5 MG PO TABS: ORAL_TABLET | ORAL | 2 refills | Status: DC

## 2018-05-28 MED ORDER — BENZONATATE 200 MG PO CAPS: 200.0000 mg | ORAL_CAPSULE | Freq: Three times a day (TID) | ORAL | 1 refills | Status: DC | PRN

## 2018-05-28 MED ORDER — ALBUTEROL SULFATE HFA 108 (90 BASE) MCG/ACT IN AERS: 2.0000 | INHALATION_SPRAY | Freq: Four times a day (QID) | RESPIRATORY_TRACT | 5 refills | Status: DC | PRN

## 2018-05-28 MED ORDER — BUDESONIDE-FORMOTEROL FUMARATE 160-4.5 MCG/ACT IN AERO: INHALATION_SPRAY | RESPIRATORY_TRACT | 5 refills | Status: DC

## 2018-05-28 MED ORDER — IPRATROPIUM-ALBUTEROL 0.5-2.5 (3) MG/3ML IN SOLN: RESPIRATORY_TRACT | 3 refills | Status: DC

## 2018-05-28 MED ORDER — NYSTATIN 100000 UNIT/ML MT SUSP: 5.0000 mL | Freq: Four times a day (QID) | OROMUCOSAL | 2 refills | Status: DC

## 2018-05-28 MED ORDER — MONTELUKAST SODIUM 10 MG PO TABS: ORAL_TABLET | ORAL | 3 refills | Status: DC

## 2018-05-28 MED ORDER — AEROCHAMBER MV MISC: 0 refills | Status: AC

## 2018-05-28 MED ORDER — CLORAZEPATE DIPOTASSIUM 7.5 MG PO TABS: 7.5000 mg | ORAL_TABLET | Freq: Two times a day (BID) | ORAL | 5 refills | Status: DC | PRN

## 2018-05-28 MED ORDER — FLUTICASONE PROPIONATE 50 MCG/ACT NA SUSP: 2.0000 | Freq: Every day | NASAL | 11 refills | Status: DC

## 2018-05-28 NOTE — Progress Notes (Signed)
Patient ID: [name] [name], male    DOB: July 16, 1966, 52 y.o.   MRN: [id number]  HPI male never smoker followed for chronic obstructive asthma, OSA/quit CPAP, rhinitis/chronic sinusitis Unattended Home Sleep Test 02/09/15- mild OSA/ AHI 9.3/ hr, desat to 85%, weight 276 lbs PFT 03/06/2007 PFT 01/29/2011 Office Spirometry 02/03/2014-severe obstructive airways disease with low FVC.  FVC 3.13/62%, FEV1 2.01/50%, ratio 0.64,  FEF 25-75% 0.87/22% CT sinus 11/23/2009-moderate chronic appearing pansinusitis. 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 FVC 2.66/53%, FEV1 2.0/51%, ratio 0.75, FEF 25-75% 1.60/46% IgE 10/02/2016-295 --------------------------------------------------------------------------------------  02/09/2018- 52 year old male never smoker followed for chronic obstructive asthma ( Asthma- COPD Overlap Syndrome), OSA/quit CPAP, rhinitis/chronic polypoid sinusitis, Was given prednisone taper and Bactrim DS at 6/10 visit. -----Asthma;Pt told to f/u per TP. Pt states he is feeling better as well.  Prednisone maintenance 15 mg, Singulair, DuoNeb, Symbicort 160, albuterol HFA, Flonase Blames "summer cold" for exacerbation last month-much better now.  Never really tried Trelegy.  Asks if Bactrim cleared a very long-standing bacterial infection with MRSA.  Still paces himself, wheezes some most days especially with exertion.  Prednisone 2 or 3 x 5 mg tablets daily especially on work days. CXR 12/29/2017- IMPRESSION: Mild bronchitic changes.  05/28/2018- 52 year old male never smoker followed for chronic obstructive asthma ( Asthma- COPD Overlap Syndrome), OSA/quit CPAP, rhinitis/chronic polypoid sinusitis, -----4 month follow up. Increased SOB, productive cough with green phlegm.  Prednisone daily, Singulair, neb DuoNeb, Symbicort 160, albuterol HFA, Flonase,  benzonatate, Persistent chest congestion with clear or yellow mucus.  Just finished a course of doxycycline without benefit.  Does not feel "ill".  Sputum cultures from June were negative.  Still tends to get thrush.  Denies fever or sweat.  Previous MRSA positive sputum did respond to doxycycline.  Wife has brought 3 cats into the house.  He was not sensitive to cat on last allergy profile blood test.  Total IgE 295 He thinks he did better on Symbicort than Trelegy.  Nebulizer treatments work well, used a couple times a day.  Review of Systems-See HPI  + = positive Constitutional:    No- night sweats ,fevers, chills, fatigue, lassitude. HEENT:   No - headaches,  Difficulty swallowing, Tooth/dental problems, Sore throat,                No sneezing, itching, ear ache, CV:  No chest pain,  Orthopnea, PND, swelling in lower extremities, anasarca, dizziness, palpitations GI  No heartburn, indigestion, abdominal pain, nausea, vomiting,  Resp:.See HPI   + productive cough   No coughing up of blood.  +change in color of mucus,                + wheeze Skin: no rash or lesions. GU: . MS:  No joint pain or swelling.  [name] Psych:   change in mood or affect.  depression and  anxiety.  No memory loss.  Today Objective:   Physical Exam General- Alert, Oriented, Affect-appropriate, Distress- none acute, + Obese / thick neck Skin- rash-none, lesions- none, excoriation- none Lymphadenopathy- none Head- atraumatic            Eyes- Gross vision intact, PERRLA, conjunctivae clear secretions            Ears- Hearing, canals normal            Nose- Clear, no-Septal dev, mucus, polyps, erosion, perforation  Throat- Mallampati  III- IV , mucosa+ geographic w probable thrush, drainage- none, tonsils- atrophic. , Neck- flexible , trachea midline, no stridor , thyroid nl, carotid no bruit Chest - symmetrical excursion , unlabored           Heart/CV- RRR , no murmur , no gallop  , no rub, nl s1 s2                            - JVD- none , edema- none, stasis changes- none, varices- none           Lung-  Wheeze+/wet congested, clear, unlabored,   cough -None , dullness-none, rub- none           Chest wall-  Abd-  Br/ Gen/ Rectal- Not done, not indicated Extrem- cyanosis- none, clubbing, none, atrophy- none, strength- nl Neuro- grossly intact to observation

## 2018-05-28 NOTE — Patient Instructions (Addendum)
Print script for aerochamber spacer      Use with Symbicort  Order- schedule  PFT     Dx asthmatic bronchitis severe persistent  Order- Prevnar-13  Pneumonia vaccine  Script for Nystatin suspension  Order- flu vax standard

## 2018-05-31 NOTE — Assessment & Plan Note (Signed)
Given prescription for nystatin suspension and we explained use of AeroChamber on his Symbicort.

## 2018-05-31 NOTE — Assessment & Plan Note (Signed)
He has excepted patient information on Nucala and Dupixent Biologics.  If we are going to get him off of systemic steroids, 1 of these would be our best hope as discussed with him.  We are giving him an AeroChamber, flu vaccine, scheduling PFT.

## 2018-07-30 ENCOUNTER — Telehealth: Payer: Self-pay | Admitting: Internal Medicine

## 2018-07-30 NOTE — Telephone Encounter (Signed)
Called and spoke with patient, he stated that he was last seen in November by Dr. Annamaria Boots. Patient stated that he was supposed to be scheduled for a PFT so that he could start on biologics. Apologized to patient for the delay in scheduling of the test. Scheduled patient. Will route this over to [facility] to follow up on so that biologics can be looked at.

## 2018-08-04 ENCOUNTER — Telehealth: Payer: Self-pay | Admitting: Internal Medicine

## 2018-08-04 DIAGNOSIS — J449 Chronic obstructive pulmonary disease, unspecified: Secondary | ICD-10-CM

## 2018-08-04 NOTE — Telephone Encounter (Signed)
Called patient, unable to reach left message to give us a call back. 

## 2018-08-04 NOTE — Telephone Encounter (Signed)
Called and spoke with Patient. He is scheduled for PFT 08/07/18.  He stated that he wanted to speak with Joellen Jersey about injections.  He stated that CY wanted to cut back his Prednisone and start him on injections, and he is concerned about cost.  He is concerned insurance will not cover, and if he can't get started on injections, he doesn't want to do the PFT. If you call his cell and he does not answer call it straight back.  He is having phone issues.   [phone] or [phone]  Will route to Katie, to follow up

## 2018-08-04 NOTE — Telephone Encounter (Signed)
Called and spoke with patient, he is requesting to speak directly to [place]. He is wanting to get some answers regarding his options of injections and medication cost. Joellen Jersey please advise, thank you.

## 2018-08-04 NOTE — Telephone Encounter (Signed)
Pt is calling back [phone] if pt doesn't answer call [phone]

## 2018-08-04 NOTE — Telephone Encounter (Signed)
Please let patient know that either way he will need to complete PFT as these results will help with his treatment plan. Pt can ask to speak with me directly after his PFT apt on Friday and will review Biologic options as well as copay assistance to help with cost.

## 2018-08-05 NOTE — Telephone Encounter (Signed)
Patient states he received a call from [place] and he picked up and there was no one there, he states he is by the phone waiting for Katie's call, CB is [phone]

## 2018-08-05 NOTE — Telephone Encounter (Signed)
Pt is calling back requesting to speak directly to [facility]. Cb is (home) [phone].

## 2018-08-05 NOTE — Telephone Encounter (Signed)
Pt states he is having concerns about the cost of PFT as his insurance his an unusual plan. Pt aware that I will contact his insurance company to seek the coverage plan and his estimated cost for test. Pt was already aware of Biologic information and will continue to research which medication he would like to proceed with.

## 2018-08-05 NOTE — Telephone Encounter (Signed)
[name] [name], patient is requesting to speak with you only PRIOR TO his PFT.  He declines speaking with anyone else.  Please call patient at your earliest convenience.  Thank you.

## 2018-08-06 NOTE — Telephone Encounter (Signed)
[name] [name]- can this be closed?

## 2018-08-07 ENCOUNTER — Ambulatory Visit (INDEPENDENT_AMBULATORY_CARE_PROVIDER_SITE_OTHER): Payer: BLUE CROSS/BLUE SHIELD | Admitting: Internal Medicine

## 2018-08-07 DIAGNOSIS — J449 Chronic obstructive pulmonary disease, unspecified: Secondary | ICD-10-CM | POA: Diagnosis not present

## 2018-08-07 LAB — PULMONARY FUNCTION TEST
DL/VA % PRED: 148 %
DL/VA: 6.9 ml/min/mmHg/L
DLCO unc % pred: 99 %
DLCO unc: 32.26 ml/min/mmHg
FEF 25-75 POST: 3.15 L/s
FEF 25-75 Pre: 2.04 L/sec
FEF2575-%Change-Post: 54 %
FEF2575-%Pred-Post: 93 %
FEF2575-%Pred-Pre: 60 %
FEV1-%CHANGE-POST: 12 %
FEV1-%Pred-Post: 65 %
FEV1-%Pred-Pre: 58 %
FEV1-Post: 2.55 L
FEV1-Pre: 2.28 L
FEV1FVC-%Change-Post: 5 %
FEV1FVC-%Pred-Pre: 101 %
FEV6-%Change-Post: 6 %
FEV6-%Pred-Post: 63 %
FEV6-%Pred-Pre: 59 %
FEV6-POST: 3.07 L
FEV6-Pre: 2.88 L
FEV6FVC-%Change-Post: 0 %
FEV6FVC-%Pred-Post: 103 %
FEV6FVC-%Pred-Pre: 103 %
FVC-%Change-Post: 6 %
FVC-%Pred-Post: 61 %
FVC-%Pred-Pre: 57 %
FVC-Post: 3.09 L
FVC-Pre: 2.9 L
Post FEV1/FVC ratio: 82 %
Post FEV6/FVC ratio: 99 %
Pre FEV1/FVC ratio: 78 %
Pre FEV6/FVC Ratio: 99 %
RV % pred: 107 %
RV: 2.23 L
TLC % PRED: 78 %
TLC: 5.45 L

## 2018-08-07 NOTE — Telephone Encounter (Signed)
Spoke with patient-he is aware that his Insurance will only tell me that he has to meet his deductible first then they cover his visits,etc according to his plan and what is on his insurance card as far as his PFT test goes.  For Biologic drug [name] the patient will need to sign a form stating he is okay with the company running a benefit investatgation to see coverage details.   Pt will sign forms today at this PFT apt. Nothing more needed at this time.

## 2018-08-07 NOTE — Progress Notes (Signed)
Full PFT performed today. °

## 2018-09-02 ENCOUNTER — Telehealth: Payer: Self-pay | Admitting: Internal Medicine

## 2018-09-07 NOTE — Telephone Encounter (Signed)
[name] [name], any updates? 

## 2018-09-08 NOTE — Telephone Encounter (Signed)
Melissa with [facility] calling about pt's [place] PA. She can be reached at [phone].

## 2018-09-08 NOTE — Telephone Encounter (Signed)
Pt called in stating he has been expecting a call from [place] or Tammy S. Pt is requesting a call today on his mobile number or work number [phone]). Pt states to try both numbers if he cannot be reached at one.   Will forward to Floyd since Joellen Jersey is unavailable today.

## 2018-09-08 NOTE — Telephone Encounter (Signed)
Message has been in left messages box. I clicked on the message and attempted to call the patient, the phone rang busy, this must have been while patient was calling into [place]. Will route this over to TS since Daneil Dan has already taken a call from the patient.

## 2018-09-11 NOTE — Telephone Encounter (Signed)
PA requested via Cover my meds-this has been completed at urgent request and will wait for a decision within 24-48 hours.   CMM KEY: [id number]

## 2018-09-11 NOTE — Telephone Encounter (Signed)
Pt is aware that PA has been sent as URGENT. Will keep him updated as we get information.

## 2018-09-15 NOTE — Telephone Encounter (Signed)
Will route this over to the PA injection pool. Please advise if there has been any update. Thank you.

## 2018-09-23 ENCOUNTER — Telehealth: Payer: Self-pay | Admitting: Internal Medicine

## 2018-09-23 ENCOUNTER — Telehealth: Payer: Self-pay

## 2018-09-23 NOTE — Telephone Encounter (Signed)
This was sent to the injection pool however due to Joellen Jersey being out I will route this over to TS to see if she has any updates on this please advise, thank you.

## 2018-09-23 NOTE — Telephone Encounter (Signed)
[name] [name], is requesting clinical notes for [name]. Cb is [phone] Fax number [phone] Ref Number [phone]

## 2018-09-23 NOTE — Telephone Encounter (Signed)
Attempted to call patient regarding labwork per Dr. Annamaria Boots. Left message to call back.

## 2018-09-24 ENCOUNTER — Other Ambulatory Visit: Payer: Self-pay

## 2018-09-24 DIAGNOSIS — J4550 Severe persistent asthma, uncomplicated: Secondary | ICD-10-CM

## 2018-09-24 NOTE — Telephone Encounter (Signed)
TS please advise thank you.

## 2018-09-24 NOTE — Telephone Encounter (Signed)
Attempted to call patient regarding labwork per Dr. Annamaria Boots. Left message to call back x2

## 2018-09-25 NOTE — Telephone Encounter (Signed)
Called and spoke with patient regarding labwork per CY. Patient agreed to come in to get labs sometime next week.  Orders placed per CY. Nothing further needed at this time.

## 2018-09-28 ENCOUNTER — Other Ambulatory Visit (INDEPENDENT_AMBULATORY_CARE_PROVIDER_SITE_OTHER): Payer: BLUE CROSS/BLUE SHIELD

## 2018-09-28 DIAGNOSIS — J4550 Severe persistent asthma, uncomplicated: Secondary | ICD-10-CM

## 2018-09-28 LAB — CBC WITH DIFFERENTIAL/PLATELET
Basophils Absolute: 0.1 10*3/uL (ref 0.0–0.1)
Basophils Relative: 0.7 % (ref 0.0–3.0)
Eosinophils Absolute: 0.2 10*3/uL (ref 0.0–0.7)
Eosinophils Relative: 2.4 % (ref 0.0–5.0)
HEMATOCRIT: 48.9 % (ref 39.0–52.0)
Hemoglobin: 16.1 g/dL (ref 13.0–17.0)
Lymphs Abs: 0.6 10*3/uL — ABNORMAL LOW (ref 0.7–4.0)
MCHC: 32.9 g/dL (ref 30.0–36.0)
MCV: 90.5 fl (ref 78.0–100.0)
Monocytes Absolute: 0.7 10*3/uL (ref 0.1–1.0)
Monocytes Relative: 6.6 % (ref 3.0–12.0)
Neutro Abs: 8.3 10*3/uL — ABNORMAL HIGH (ref 1.4–7.7)
Neutrophils Relative %: 83.9 % — ABNORMAL HIGH (ref 43.0–77.0)
Platelets: 247 10*3/uL (ref 150.0–400.0)
RBC: 5.4 Mil/uL (ref 4.22–5.81)
RDW: 14.8 % (ref 11.5–15.5)
WBC: 9.9 10*3/uL (ref 4.0–10.5)

## 2018-09-29 NOTE — Telephone Encounter (Signed)
Per protocol from LL will route this over to the injection pool.

## 2018-09-30 ENCOUNTER — Encounter: Payer: Self-pay | Admitting: Internal Medicine

## 2018-09-30 NOTE — Telephone Encounter (Signed)
Letter and form given to [employer] 

## 2018-09-30 NOTE — Telephone Encounter (Signed)
We received a fax from [facility]. The PA was denied for [name] [name] due to the insurance company not receiving the information that they needed in a timely manner. An appeal will need to be started.  CY - we will need a letter of medical necessity from you in order to start this appeal. Thanks.

## 2018-10-01 ENCOUNTER — Telehealth: Payer: Self-pay | Admitting: Internal Medicine

## 2018-10-01 NOTE — Telephone Encounter (Signed)
Appeal letter has been found and faxed to [facility] at [phone]. Will await appeal decision.  Follow up can be made with [place] at [phone] ext. [phone].

## 2018-10-01 NOTE — Telephone Encounter (Signed)
Duplicate message. Please see telephone encounter dated 09/02/2018.

## 2018-10-01 NOTE — Telephone Encounter (Signed)
Message routed to injection pool to follow up. 

## 2018-10-02 ENCOUNTER — Telehealth: Payer: Self-pay | Admitting: Internal Medicine

## 2018-10-02 NOTE — Telephone Encounter (Signed)
Advised pt of results. He wanted to know if he should get the shot under his insurance he has now because he will be changing insurances on 10/21/2018. He may be able to get one injection before he changes. I advised him I would call [employer] in the morning to check on the appeal status. FYI CY    Notes recorded by Deneise Lever, MD on 10/01/2018 at 10:25 AM EDT Complete blood cell count showed some changes consistent with steroid use. Otherwise ok.

## 2018-10-02 NOTE — Telephone Encounter (Signed)
Patient returning phone call,  Phone number is [phone] c.

## 2018-10-05 NOTE — Telephone Encounter (Signed)
Please advise on update, thank you.

## 2018-10-05 NOTE — Telephone Encounter (Signed)
This was routed to [facility] as an [place]

## 2018-10-06 NOTE — Telephone Encounter (Signed)
Please check with Kathlee Nations on status of appeal- I had filled out a form, but don't know if she sent it yet.

## 2018-10-07 ENCOUNTER — Telehealth: Payer: Self-pay | Admitting: Internal Medicine

## 2018-10-07 ENCOUNTER — Encounter: Payer: Self-pay | Admitting: Internal Medicine

## 2018-10-07 NOTE — Telephone Encounter (Signed)
Received a fax from [facility] on 10/02/2018. They are needing some information (CPT code and ICD-10 code) added to the pt's appeal letter. Bradley Ferris, CMA is going to update the pt's letter with the information that's needed and fax it to [facility].

## 2018-10-07 NOTE — Telephone Encounter (Signed)
Letter printed and on C-side

## 2018-10-07 NOTE — Telephone Encounter (Signed)
See telephone encounter (09/02/2018)

## 2018-10-07 NOTE — Telephone Encounter (Signed)
Spoke with the pt  He works as a Nurse, learning disability  He states given his chronic bronchitis, he wants a Quarry manager from Dr Annamaria Boots stating that he should limit his amount of interaction with the public  They have already stopped doing public services and only allow family in the funeral home  Please advise, thanks

## 2018-10-07 NOTE — Telephone Encounter (Signed)
Letter signed and faxed to attn leigh ann [phone] Nothing further needed per pt

## 2018-10-08 NOTE — Telephone Encounter (Signed)
Please advise, thank you.

## 2018-10-08 NOTE — Telephone Encounter (Signed)
[name] [name], is requesting more information for PA. Cb is [phone]. Ref [phone]

## 2018-10-12 ENCOUNTER — Other Ambulatory Visit: Payer: Self-pay | Admitting: Internal Medicine

## 2018-10-12 NOTE — Telephone Encounter (Signed)
Noted, thank you

## 2018-10-12 NOTE — Telephone Encounter (Signed)
Prednisone refill e-sent 

## 2018-10-12 NOTE — Telephone Encounter (Signed)
CY please advise if ok to refill. Thank you.

## 2018-10-14 ENCOUNTER — Other Ambulatory Visit: Payer: Self-pay | Admitting: Internal Medicine

## 2018-10-16 ENCOUNTER — Ambulatory Visit (INDEPENDENT_AMBULATORY_CARE_PROVIDER_SITE_OTHER): Payer: BLUE CROSS/BLUE SHIELD | Admitting: Internal Medicine

## 2018-10-16 ENCOUNTER — Other Ambulatory Visit: Payer: Self-pay

## 2018-10-16 ENCOUNTER — Encounter: Payer: Self-pay | Admitting: Internal Medicine

## 2018-10-16 DIAGNOSIS — J4489 Other specified chronic obstructive pulmonary disease: Secondary | ICD-10-CM

## 2018-10-16 DIAGNOSIS — J302 Other seasonal allergic rhinitis: Secondary | ICD-10-CM | POA: Diagnosis not present

## 2018-10-16 DIAGNOSIS — J3089 Other allergic rhinitis: Secondary | ICD-10-CM | POA: Diagnosis not present

## 2018-10-16 DIAGNOSIS — J449 Chronic obstructive pulmonary disease, unspecified: Secondary | ICD-10-CM | POA: Diagnosis not present

## 2018-10-16 MED ORDER — IPRATROPIUM-ALBUTEROL 0.5-2.5 (3) MG/3ML IN SOLN: RESPIRATORY_TRACT | 3 refills | Status: DC

## 2018-10-16 NOTE — Progress Notes (Signed)
Patient ID: [name] [name], male    DOB: Sep 21, 1965, 53 y.o.   MRN: [id number]  HPI male never smoker followed for chronic obstructive asthma, OSA/quit CPAP, rhinitis/chronic sinusitis Unattended Home Sleep Test 02/09/15- mild OSA/ AHI 9.3/ hr, desat to 85%, weight 276 lbs PFT 03/06/2007 PFT 01/29/2011 Office Spirometry 02/03/2014-severe obstructive airways disease with low FVC.  FVC 3.13/62%, FEV1 2.01/50%, ratio 0.64,  FEF 25-75% 0.87/22% CT sinus 11/23/2009-moderate chronic appearing pansinusitis. Surgery 09/24/2010-polypoid chronic sinusitis CBC with differential 12/28/2015-eosinophils 7.3% CBC w diff 09/28/2018- steroid pattern EOS 0.2 k/ul FENO 10/02/16- 33 H Office spirometry 10/02/16-moderate restriction and obstruction. FVC 2.66/53%, FEV1 2.0/51%, ratio 0.75, FEF 25-75% 1.60/46% IgE 10/02/2016-295 -------------------------------------------------------------------------------------- 05/28/2018- 53 year old male never smoker followed for chronic obstructive asthma ( Asthma- COPD Overlap Syndrome), OSA/quit CPAP, rhinitis/chronic polypoid sinusitis, -----4 month follow up. Increased SOB, productive cough with green phlegm.  Prednisone daily, Singulair, neb DuoNeb, Symbicort 160, albuterol HFA, Flonase, benzonatate, Persistent chest congestion with clear or yellow mucus.  Just finished a course of doxycycline without benefit.  Does not feel "ill".  Sputum cultures from June were negative.  Still tends to get thrush.  Denies fever or sweat.  Previous MRSA positive sputum did respond to doxycycline.  Wife has brought 3 cats into the house.  He was not sensitive to cat on last allergy profile blood test.  Total IgE 295 He thinks he did better on Symbicort than Trelegy.  Nebulizer treatments work well, used a couple times a day.  10/16/2018- 53 year old male never smoker followed for chronic obstructive asthma ( Asthma- COPD Overlap Syndrome), OSA/quit CPAP, rhinitis/chronic polypoid  sinusitis, -----breathing varies, had labs drawn early March & reports feeling "bad" after;  wheezing this morning, cough w/ light green-yellow mucus, chest tightness/pain no more than usual [name] has been approved by his insurance. 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 Bothered by Spring pollens with increased rhinitis. Wheeze now is disturbing sleep. Neb treatments 4-5 times daily with Duoneb definitely help- discussed alternatives and side effects.  Notes easy bruising and we again discussed trial of Fasenra with hope it would reduce steroid dependence. He is concerned about cost. CBC w diff 09/28/2018- steroid pattern EOS 0.2 k/ul  Review of Systems-See HPI  + = positive Constitutional:    No- night sweats ,fevers, chills, fatigue, lassitude. HEENT:   No - headaches,  Difficulty swallowing, Tooth/dental problems, Sore throat,                No sneezing, itching, ear ache, CV:  No chest pain,  Orthopnea, PND, swelling in lower extremities, anasarca, dizziness, palpitations GI  No heartburn, indigestion, abdominal pain, nausea, vomiting,  Resp:.See HPI   + productive cough   No coughing up of blood.  +change in color of mucus,                + wheeze Skin: no rash or lesions. GU: . MS:  No joint pain or swelling.  [name] Psych:   change in mood or affect.  depression and  anxiety.  No memory loss.  Today Objective:   Physical Exam General- Alert, Oriented, Affect-appropriate, Distress- none acute, + Obese / thick neck Skin- rash-none, lesions- none, excoriation- none Lymphadenopathy- none Head- atraumatic            Eyes- Gross vision intact, PERRLA, conjunctivae clear secretions            Ears- Hearing, canals normal  Nose- Clear, no-Septal dev, mucus, polyps, erosion, perforation             Throat- Mallampati  III- IV , mucosa+ geographic, drainage- none, tonsils- atrophic. , Neck- flexible , trachea midline, no stridor , thyroid nl, carotid no  bruit Chest - symmetrical excursion , unlabored           Heart/CV- RRR , no murmur , no gallop  , no rub, nl s1 s2                           - JVD- none , edema- none, stasis changes- none, varices- none           Lung-  Wheeze+, clear, unlabored,   cough -None , dullness-none, rub- none           Chest wall-  Abd-  Br/ Gen/ Rectal- Not done, not indicated Extrem- cyanosis- none, clubbing, none, atrophy- none, strength- nl Neuro- grossly intact to observation

## 2018-10-16 NOTE — Assessment & Plan Note (Signed)
Already on systemic steroids.  Plan- Flonase/ antihistamine

## 2018-10-16 NOTE — Telephone Encounter (Signed)
Pt seen in office today. Brought PA appeal letter from 10/09/2018 reference #:[id number] stating services have been approved. Reviewed with CY. Copy has been made to be scanned. Will make Tammy Scott aware.

## 2018-10-16 NOTE — Assessment & Plan Note (Signed)
Severe, chronically steroid dependent. I am really hopeful that [name] will make a big difference for him. Plan- Start [place]. Explained generic Symbicort.

## 2018-10-16 NOTE — Patient Instructions (Signed)
Ok to ask your drug store for generic Symbicort  We will contact you when Fasenra injections are ready to start  Script printed to increase your Duoneb neb treatments so you can use up to 5 daily if needed

## 2018-10-19 ENCOUNTER — Telehealth: Payer: Self-pay | Admitting: Internal Medicine

## 2018-10-19 ENCOUNTER — Ambulatory Visit: Payer: Self-pay | Admitting: Internal Medicine

## 2018-10-19 DIAGNOSIS — J4550 Severe persistent asthma, uncomplicated: Secondary | ICD-10-CM | POA: Diagnosis not present

## 2018-10-19 MED ORDER — EPINEPHRINE 0.3 MG/0.3ML IJ SOAJ: 0.3000 mg | Freq: Once | INTRAMUSCULAR | 11 refills | Status: DC

## 2018-10-19 NOTE — Telephone Encounter (Signed)
Rx sent 

## 2018-10-19 NOTE — Telephone Encounter (Signed)
Spoke to pt. Friday and today. Pt's deductible will start over again for 10/2018 to 09/2019. I was able to get pt's [facility] 10/20/2018.  However, I sent an electronic script to [facility] and they won't fill it until our office calls them and confirms, we are aware his ins. Runs out for 09/2018. That is why we are trying to give him his inj. 10/20/2018. If [facility] hasn't called you about this, please call them at: [phone]. I won't be able to give him a shot 10/20/2018 if he doesn't have an Epi-pen.(Generic) Please call the pt. When this is resolved CB [phone]. Thanks,  [employer]

## 2018-10-19 NOTE — Telephone Encounter (Signed)
[facility] and they stated that that [name] [name] needed a prior authorization for his epipen. TS stated she could not give him an injection without it. I will call [employer] to perform PA.   [phone]

## 2018-10-19 NOTE — Telephone Encounter (Signed)
Will ask Ria Comment to work with Alroy Bailiff to help this gentleman with his biologic therapy.

## 2018-10-19 NOTE — Telephone Encounter (Signed)
Started PA for Epi pen thorough covermymeds I did request for the request to be expedited.   [name] [name] (Key: [id number])    Your information has been submitted to [employer]. [employer] is reviewing the PA request and you will receive an electronic response. You may check for the updated outcome later by reopening this request. The standard fax determination will also be sent to you directly. If you have any questions about your PA submission, contact [employer] at [phone].  I called [employer] because I realized we only had until tomorrow to send this in. I called [employer] to make sure they received the PA and that they will expedite request but they advised me that [facility] does their own PA's. She advised me to call [phone].  I spoke with a rep at [facility] and she stated that the request was in and that it would take 24-72 hours to be reviewed. I advised her that I sent the request urgently and she stated she did not see that on her end. She stated her manager will send it emergently but cannot make any promises. I have the key and there is proof that I selected urgent.   I called pt to let him know what happened. He doesn't know if he will even be able to afford this medication after the first injection because he has a 6000 deductible. FYI [name] [name] and [name] S.  He would also like to know if he could send the medication back if he decides not to get it?

## 2018-10-19 NOTE — Telephone Encounter (Signed)
Pt is calling Tammy back [phone]

## 2018-10-19 NOTE — Telephone Encounter (Addendum)
I ordered pt.'s [name] this morning. Called pt. To let him know his med. Will be here tomorrow. Also told pt I would send an Epi-Pen rx to [facility]. Pt. Said no one told him he needed an Epi-Pen. " No one told me about having to buy an Epi-Pen.  I went to Dr. Annamaria Boots as inscructed by [facility], about letting pt hold on to Epi-Pen for 2 hrs.. Dr. Annamaria Boots said that would be fine, we'll worry about getting the Epi-pen later. (After pt's inj.. Which means I would be calling pharmacies trying to get Epi as affordable as possible. (Immediately after)) Went back to [facility] she said the practice ordered the Epi-pen, we can't get samples anymore. If the pt has to use the Epi-Pen whose going to pay for it. I called the pt to make him aware. Pt. Michela Pitcher he would keep try to finds coupons to help pay for Epi.. Please refer to 10/20/2018 note for further updates.

## 2018-10-19 NOTE — Telephone Encounter (Signed)
[facility] [address] Epi-pen needs a PA.   PA initiated via covermymeds. Key: [id number]  Call made to patient to make aware PA has been submitted. Patient is frustrated and states you all should have told me I needed this epi-pen prior to now and this is very unprofessional. I apologized and explained that I would try and have this taken care of today. He states he called [employer] and they told him they could have a answer within 2 hours. I again apologized to the patient and made aware we would get everything taken care of.   Call made to [facility], spoke with Anders Simmonds, she states even if marked urgent they have never been able to address in 2 hours.   TS, PA submitted awaiting a decision. Patient asked that we keep his appt in case we do have a decision. Made aware we will probably have to reschedule.

## 2018-10-20 ENCOUNTER — Ambulatory Visit (INDEPENDENT_AMBULATORY_CARE_PROVIDER_SITE_OTHER): Payer: BLUE CROSS/BLUE SHIELD

## 2018-10-20 ENCOUNTER — Other Ambulatory Visit: Payer: Self-pay

## 2018-10-20 DIAGNOSIS — J4550 Severe persistent asthma, uncomplicated: Secondary | ICD-10-CM

## 2018-10-20 MED ORDER — BENRALIZUMAB 30 MG/ML ~~LOC~~ SOSY
30.0000 mg | PREFILLED_SYRINGE | Freq: Once | SUBCUTANEOUS | Status: AC
  Administered 2018-10-20: 30 mg via SUBCUTANEOUS

## 2018-10-20 NOTE — Telephone Encounter (Signed)
Per protocol will route this over to injection pool. 

## 2018-10-20 NOTE — Telephone Encounter (Signed)
Ok- as per our last discussion face to face, we are going ahead with his Fasenra injections.

## 2018-10-20 NOTE — Telephone Encounter (Signed)
Pt is returning call. Per Pt, he is picking up his Epi Pen today at 10:30. Pt will be bringing paperwork for Inj assistance. Cb is [phone].

## 2018-10-20 NOTE — Telephone Encounter (Signed)
Sorry- Triage- please redirect my last comment to [employer]

## 2018-10-20 NOTE — Telephone Encounter (Signed)
Dr. Annamaria Boots, our last face to face conversation was this morning, when you came to me and said not to give the injection. You said we'll just have to try it again this Summer. (Do you remember, now?)   The conversation you're referring to happened yesterday afternoon. I called the rembruisiment rep. She pulled the pt. Up and said he was approved up to $13,000 for the yr.[name] [name] came and talked to [name] [name], and explained to him he's only responsible for $100 a month for the first 3 months. Thereafter he'll be getting an inj. every 8wks..  Pt was able to get his inj, he brought his Epi-Pen in and let me see it. I then of course, showed him how to use it. He is doing great. I've been checking on pt. Every 15 to 20 mins.. Pt is an hr. And 20 mins. In, no reactions or sx.. Pt was still good at 1:42 when he left. No reactions or sx,still.

## 2018-10-20 NOTE — Telephone Encounter (Signed)
Alroy Bailiff will update chart once the pt comes in for his injection today.

## 2018-10-20 NOTE — Telephone Encounter (Signed)
Called pt back, With the help of [facility] Pt was able to find a coupon for $110. Pt. Is still going to have to pay $109. Pt called [facility] and a few other pharmacies, not a lot of difference in the price. [name] came in, I tried to cancel the order yesterday. It was already on the truck and had a tracking #. Once the med. Has been ordered, it cannot be returned. I'm going to call the rembursiment rep here in a minutes to find out if they will be able to offer any co-pay assistance form now til 10/20/2019. I am routing this to [employer]. And CY to make them aware.

## 2018-10-20 NOTE — Telephone Encounter (Signed)
Message has been routed to injection pool for them to follow up on.

## 2018-10-21 ENCOUNTER — Telehealth: Payer: Self-pay | Admitting: Internal Medicine

## 2018-10-21 NOTE — Telephone Encounter (Signed)
Late reactions have been reported up to 72 hours after injection, but that is rare. Should be sufficient to have Epipen available at time of injection. We will see how he does with the next couple of shots.

## 2018-10-21 NOTE — Telephone Encounter (Signed)
FYI:  1st allegry injection [name]) on yesterday @ 11:40. 2 hour wait 3:30 pm started feeling hot, scratchy throat, back stiffness, joint aches. 6 pm temp102 7:30 temp 101 8:30 temp 100 11:20 temp 99 12:30 temp 98 8:00 am temp 98.6 He did not take anything for fever and feels fine today. All of the sx have passed.  Question should he keep epi-pen on hand at all times or just when he gets injection.

## 2018-10-21 NOTE — Telephone Encounter (Signed)
Medication name and strength: Epinephrine 0.3mg  auto injector Provider: CY [facility] ID: [id number]    Was the PA started on CMM?  Today 10/21/2018  If yes, please enter the Key: Key: [id number] - Rx #: [id number]  Timeframe for approval/denial: 5-7 business day for determination  Routing message to Janeth Rase for review.

## 2018-10-22 NOTE — Telephone Encounter (Signed)
Pt paid out of pocket for his Epipen per [employer]. A PA is NOT needed at this time.

## 2018-10-22 NOTE — Telephone Encounter (Signed)
We received a fax that the pt's epipen was denied by his insurance. Per [name] [name], the pt paid out of pocket for this. Nothing further was needed at this time.

## 2018-10-23 NOTE — Telephone Encounter (Signed)
Spoke with pt. He is aware of CY's response. States that he is doing just fine now, not having any symptoms. Nothing further is needed.

## 2018-11-10 ENCOUNTER — Telehealth: Payer: Self-pay | Admitting: Internal Medicine

## 2018-11-10 NOTE — Telephone Encounter (Signed)
Called [facility] to order pt's medication.  [name] Order: 30mg  #1 prefilled syringe Ordered date: 11/10/2018 Expected shipping date from pharmacy: could not give me a date, was told that it should be here by next week. Ordered by: Desmond Dike, [place] with pt. He has been scheduled for his appointment on 11/18/2018 at 10am. Will await medication shipment.

## 2018-11-10 NOTE — Telephone Encounter (Signed)
Per Alroy Bailiff, pt does not have any medication in stock. Medication will have to be ordered.

## 2018-11-12 ENCOUNTER — Other Ambulatory Visit: Payer: Self-pay | Admitting: Internal Medicine

## 2018-11-18 ENCOUNTER — Other Ambulatory Visit: Payer: Self-pay

## 2018-11-18 ENCOUNTER — Ambulatory Visit (INDEPENDENT_AMBULATORY_CARE_PROVIDER_SITE_OTHER): Payer: BLUE CROSS/BLUE SHIELD

## 2018-11-18 DIAGNOSIS — J4550 Severe persistent asthma, uncomplicated: Secondary | ICD-10-CM

## 2018-11-18 MED ORDER — BENRALIZUMAB 30 MG/ML ~~LOC~~ SOSY
30.0000 mg | PREFILLED_SYRINGE | Freq: Once | SUBCUTANEOUS | Status: AC
  Administered 2018-11-18: 30 mg via SUBCUTANEOUS

## 2018-11-18 NOTE — Progress Notes (Signed)
Have you been hospitalized within the last 10 days?  No Do you have a fever?  No Do you have a cough?  No Do you have a headache or sore throat? No  

## 2018-11-20 NOTE — Telephone Encounter (Signed)
Fasenra Shipment Received:  30mg  #1 prefilled syringe Medication arrival Date:11/20/2018 Lot #: [id number] Medication exp date: 01/2020 Received by: Desmond Dike, CMA

## 2018-11-28 ENCOUNTER — Other Ambulatory Visit: Payer: Self-pay | Admitting: Internal Medicine

## 2018-12-01 NOTE — Telephone Encounter (Signed)
Pt last seen 10/16/2018 by CY. Clorazepate (TRANXENE) 7.5 mg tablet last ordered 05/28/2018 for 60 tablets x5 refills.   Current Outpatient Medications on File Prior to Visit  Medication Sig Dispense Refill  . albuterol (VENTOLIN HFA) 108 (90 Base) MCG/ACT inhaler Inhale 2 puffs into the lungs every 6 (six) hours as needed for wheezing or shortness of breath. 1 Inhaler 5  . benzonatate (TESSALON) 200 MG capsule Take 1 capsule (200 mg total) by mouth 3 (three) times daily as needed for cough. 30 capsule 1  . budesonide-formoterol (SYMBICORT) 160-4.5 MCG/ACT inhaler INHALE 2 PUFFS INTO THE LUGS TWICE A DAY RINSE MOUTH AFTER USE 1 Inhaler 5  . clorazepate (TRANXENE) 7.5 MG tablet Take 1 tablet (7.5 mg total) by mouth 2 (two) times daily as needed. 60 tablet 5  . clotrimazole (MYCELEX) 10 MG troche Take 1 tablet (10 mg total) by mouth 5 (five) times daily. 35 tablet 0  . cyclobenzaprine (FEXMID) 7.5 MG tablet Take 7.5 mg by mouth 3 (three) times daily as needed for muscle spasms.    [name] EPINEPHrine 0.3 mg/0.3 mL IJ SOAJ injection Inject 0.3 mLs (0.3 mg total) into the muscle once for 1 dose. 1 Device 11  . fluconazole (DIFLUCAN) 100 MG tablet Take 1 tablet (100 mg total) by mouth daily. 10 tablet 0  . fluticasone (FLONASE) 50 MCG/ACT nasal spray Place 2 sprays into both nostrils daily. 16 g 11  . ibuprofen (ADVIL,MOTRIN) 800 MG tablet Take 800 mg by mouth every 8 (eight) hours as needed.    [name] ipratropium-albuterol (DUONEB) 0.5-2.5 (3) MG/3ML SOLN INHALE 1 VIAL VIA NEBULIZER 4 TIMES A DAY AS NEEDED 1080 mL 3  . montelukast (SINGULAIR) 10 MG tablet 1 daily 90 tablet 3  . Nebulizers (COMPRESSOR NEBULIZER) MISC 1 Device by Does not apply route 4 (four) times daily as needed. 1 each 0  . nystatin (MYCOSTATIN) 100000 UNIT/ML suspension Take 5 mLs (500,000 Units total) by mouth 4 (four) times daily. Swish and swallow 60 mL 2  . predniSONE (DELTASONE) 5 MG tablet TAKE 2 OR 3 TABLETS BY MOUTH DAILY AS DIRECTED 90  tablet 2  . Spacer/[name]-Holding Chambers (AEROCHAMBER MV) inhaler Use as instructed 1 each 0   No current facility-administered medications on file prior to visit.    Allergies  Allergen Reactions  . Fluticasone-Salmeterol Other (See Comments)    REACTION: jaw joint pain   CY, please advise on the refill of this medication. Thank you.

## 2018-12-01 NOTE — Telephone Encounter (Signed)
Tranxene refill e-sent

## 2018-12-17 ENCOUNTER — Telehealth: Payer: Self-pay | Admitting: Internal Medicine

## 2018-12-17 NOTE — Telephone Encounter (Signed)
Returned pt's call. His next dose is here. [facility] called him and said they were sending another shipment.  I'm on hold with Elberta Fortis at [facility] now. He checking to make sure he won't be charged twice. He transferred me to another rep.(I didn't catch her name) pt's next inj after tomorrow will be shipped in July. Called and spoke with pt's wife to let her know they will not be charged for pt's 8 wks from 12/18/2018 until July.( pt is getting his last once a month shot tomorrow). Nothing further needed.

## 2018-12-18 ENCOUNTER — Other Ambulatory Visit: Payer: Self-pay

## 2018-12-18 ENCOUNTER — Ambulatory Visit (INDEPENDENT_AMBULATORY_CARE_PROVIDER_SITE_OTHER): Payer: BLUE CROSS/BLUE SHIELD

## 2018-12-18 DIAGNOSIS — J4550 Severe persistent asthma, uncomplicated: Secondary | ICD-10-CM | POA: Diagnosis not present

## 2018-12-18 MED ORDER — BENRALIZUMAB 30 MG/ML ~~LOC~~ SOSY
30.0000 mg | PREFILLED_SYRINGE | Freq: Once | SUBCUTANEOUS | Status: AC
  Administered 2018-12-18: 11:00:00 30 mg via SUBCUTANEOUS

## 2018-12-18 NOTE — Progress Notes (Signed)
Have you been hospitalized within the last 10 days?  No Do you have a fever?  No Do you have a cough?  No Do you have a headache or sore throat? No  

## 2019-01-01 ENCOUNTER — Other Ambulatory Visit: Payer: Self-pay | Admitting: Internal Medicine

## 2019-01-01 NOTE — Telephone Encounter (Signed)
Dr. Annamaria Boots is the patient still on the same prednisone therapy along with [name]? Ok to fill?  Last injection: 12/18/18 Next injection: 02/16/19 F/U appt : 02/16/19

## 2019-01-01 NOTE — Telephone Encounter (Signed)
Prednisone script refill e-sent

## 2019-01-13 ENCOUNTER — Telehealth: Payer: Self-pay | Admitting: Internal Medicine

## 2019-01-13 NOTE — Telephone Encounter (Signed)
Message routed to injection pool to follow up. 

## 2019-01-15 NOTE — Telephone Encounter (Signed)
Nicki, [facility], is calling for an update on a PA. There is an authorization for [facility] [facility] is not listed on the authorization. Per Sherron Flemings, a nurse needs to call [phone] plan number to be added to [place]. [facility] -NPI [id number]  Valorie Roosevelt [phone]

## 2019-01-15 NOTE — Telephone Encounter (Signed)
Spoke with Freda Munro at [employer]. States that the pt is needing a new PA for [employer].

## 2019-01-20 ENCOUNTER — Telehealth: Payer: Self-pay | Admitting: Internal Medicine

## 2019-01-20 NOTE — Telephone Encounter (Signed)
A PA is NOT needed, there is already one on file. There is another telephone encounter on this matter. This has been handled. Nothing further is needed.

## 2019-01-20 NOTE — Telephone Encounter (Signed)
Contacted pt's insurance company at [phone]. Spoke with Gwyndolyn Saxon. I have given him [facility]'s information and they have been added to authorization. Nothing further was needed at this time.

## 2019-01-26 ENCOUNTER — Telehealth: Payer: Self-pay | Admitting: Internal Medicine

## 2019-01-26 NOTE — Telephone Encounter (Signed)
[name] Order: 30mg  #1 prefilled syringe Ordered date: 01/26/2019 Expected date of arrival: 01/28/2019 Ordered by: Desmond Dike, [place]: [facility]   Pt has an upcoming injection appointment on 02/16/2019.

## 2019-01-27 DIAGNOSIS — J4550 Severe persistent asthma, uncomplicated: Secondary | ICD-10-CM | POA: Diagnosis not present

## 2019-01-28 NOTE — Telephone Encounter (Signed)
[name] Shipment Received:  30mg  #1 prefilled syringe Medication arrival date: 01/28/2019 Lot #: [id number] Exp date: 03/2020 Received by: TBS  I'll put med here in pt's appt notes. Nothing further needed.

## 2019-02-09 ENCOUNTER — Telehealth: Payer: Self-pay | Admitting: Internal Medicine

## 2019-02-09 MED ORDER — IPRATROPIUM-ALBUTEROL 0.5-2.5 (3) MG/3ML IN SOLN: RESPIRATORY_TRACT | 3 refills | Status: DC

## 2019-02-09 NOTE — Telephone Encounter (Signed)
Called and spoke with pt's wife Claiborne Billings verifying which med needed to be sent to [facility]. After verifying med, have sent Rx to pharmacy for pt. Nothing further needed.

## 2019-02-12 ENCOUNTER — Telehealth: Payer: Self-pay | Admitting: Internal Medicine

## 2019-02-12 MED ORDER — FLUTICASONE PROPIONATE 50 MCG/ACT NA SUSP: 2.0000 | Freq: Every day | NASAL | 11 refills | Status: DC

## 2019-02-12 NOTE — Telephone Encounter (Signed)
Refill for flonase sent to pt's preferred pharmacy. Called and spoke with pt's wife Claiborne Billings letting her know this had been done and she verbalized understanding. Nothing further needed.

## 2019-02-16 ENCOUNTER — Ambulatory Visit (INDEPENDENT_AMBULATORY_CARE_PROVIDER_SITE_OTHER): Payer: BC Managed Care – PPO | Admitting: Internal Medicine

## 2019-02-16 ENCOUNTER — Ambulatory Visit (INDEPENDENT_AMBULATORY_CARE_PROVIDER_SITE_OTHER): Payer: BC Managed Care – PPO

## 2019-02-16 ENCOUNTER — Other Ambulatory Visit: Payer: Self-pay

## 2019-02-16 ENCOUNTER — Encounter: Payer: Self-pay | Admitting: Internal Medicine

## 2019-02-16 VITALS — BP 142/80 | HR 87 | Temp 98.1°F | Ht 70.0 in | Wt 291.4 lb

## 2019-02-16 DIAGNOSIS — J4550 Severe persistent asthma, uncomplicated: Secondary | ICD-10-CM

## 2019-02-16 DIAGNOSIS — J449 Chronic obstructive pulmonary disease, unspecified: Secondary | ICD-10-CM

## 2019-02-16 DIAGNOSIS — J45909 Unspecified asthma, uncomplicated: Secondary | ICD-10-CM | POA: Diagnosis not present

## 2019-02-16 MED ORDER — BENRALIZUMAB 30 MG/ML ~~LOC~~ SOSY
30.0000 mg | PREFILLED_SYRINGE | Freq: Once | SUBCUTANEOUS | Status: AC
  Administered 2019-02-16: 30 mg via SUBCUTANEOUS

## 2019-02-16 NOTE — Patient Instructions (Addendum)
Suggest you hold prednisone at 1 or 1 and a half of your 5 mg tabs for the next month or two. See how the stiff knees feel by then and we can consider what to do about your steroid tapering and adrenal gland function at that time.  We can continue current meds including Fasenra. Please call as needed  Order- CXR     Dx Asthma COPD overlap syndrome

## 2019-02-16 NOTE — Progress Notes (Signed)
Patient ID: [name] [name], male    DOB: 11/21/1965, 53 y.o.   MRN: [id number]  HPI male never smoker followed for chronic obstructive asthma, OSA/quit CPAP, rhinitis/chronic sinusitis Unattended Home Sleep Test 02/09/15- mild OSA/ AHI 9.3/ hr, desat to 85%, weight 276 lbs PFT 03/06/2007 PFT 01/29/2011 Office Spirometry 02/03/2014-severe obstructive airways disease with low FVC.  FVC 3.13/62%, FEV1 2.01/50%, ratio 0.64,  FEF 25-75% 0.87/22% CT sinus 11/23/2009-moderate chronic appearing pansinusitis. Surgery 09/24/2010-polypoid chronic sinusitis CBC with differential 12/28/2015-eosinophils 7.3% CBC w diff 09/28/2018- steroid pattern EOS 0.2 k/ul FENO 10/02/16- 33 H Office spirometry 10/02/16-moderate restriction and obstruction. FVC 2.66/53%, FEV1 2.0/51%, ratio 0.75, FEF 25-75% 1.60/46% IgE 10/02/2016-295 PFT 01/29/2019- Mod obst, mild restrction, slight response to dilator, normal Diffusion --------------------------------------------------------------------------------------  10/16/2018- 53 year old male never smoker followed for chronic obstructive asthma ( Asthma- COPD Overlap Syndrome), OSA/quit CPAP, rhinitis/chronic polypoid sinusitis, -----breathing varies, had labs drawn early March & reports feeling "bad" after;  wheezing this morning, cough w/ light green-yellow mucus, chest tightness/pain no more than usual [name] has been approved by his insurance. 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 Bothered by Spring pollens with increased rhinitis. Wheeze now is disturbing sleep. Neb treatments 4-5 times daily with Duoneb definitely help- discussed alternatives and side effects.  Notes easy bruising and we again discussed trial of Fasenra with hope it would reduce steroid dependence. He is concerned about cost. CBC w diff 09/28/2018- steroid pattern EOS 0.2 k/ul  02/16/2019- 53 year old male never smoker followed for chronic obstructive asthma ( Asthma- COPD Overlap Syndrome),  OSA/quit CPAP, rhinitis/chronic polypoid sinusitis, -----f/u after Fasenra inj; pt states he's noticed his breathing has been better; reports congestion yesterday, clear mucus Fasenra, Prednisone maint 5 mg, Neb Duoneb, Flonase, Symbicort 160, albuterol hfa Marked improvement on Fasenra. Has gradually tapered prednisone to 5 mg daily, but began stiff knees. We discussed adrenal insuff. 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 Now sleeping through night w/o cough. Notices occ enlarged ant cervical nodes- come and go. PFT 01/29/2019- Mod obst, mild restriction, slight response to dilator, normal Diffusion CXR 12/29/17- Mild bronchitic changes.  Review of Systems-See HPI  + = positive Constitutional:    No- night sweats ,fevers, chills, fatigue, lassitude. HEENT:   No - headaches,  Difficulty swallowing, Tooth/dental problems, Sore throat,                No sneezing, itching, ear ache, CV:  No chest pain,  Orthopnea, PND, swelling in lower extremities, anasarca, dizziness, palpitations GI  No heartburn, indigestion, abdominal pain, nausea, vomiting,  Resp:.See HPI   + productive cough   No coughing up of blood.  +change in color of mucus,  + wheeze Skin: no rash or lesions. GU: . MS:  No joint pain or swelling.  [name] Psych:   change in mood or affect.  depression and  anxiety.  No memory loss.   Objective:   Physical Exam General- Alert, Oriented, Affect-appropriate, Distress- none acute, + Obese / thick neck Skin- + birthmark R hand Lymphadenopathy- none Head- atraumatic            Eyes- Gross vision intact, PERRLA, conjunctivae clear secretions            Ears- Hearing, canals normal            Nose- Clear, no-Septal dev, mucus, polyps, erosion, perforation             Throat- Mallampati  III- IV ,  mucosa+ geographic, drainage- none, tonsils- atrophic. , Neck- flexible , trachea midline, no stridor , thyroid nl, carotid no bruit Chest - symmetrical excursion , unlabored            Heart/CV- RRR , no murmur , no gallop  , no rub, nl s1 s2                           - JVD- none , edema- none, stasis changes- none, varices- none           Lung-  Wheeze-none, clear, unlabored,   cough -None , dullness-none, rub- none           Chest wall-  Abd-  Br/ Gen/ Rectal- Not done, not indicated Extrem- cyanosis- none, clubbing, none, atrophy- none, strength- nl Neuro- grossly intact to observation

## 2019-02-16 NOTE — Assessment & Plan Note (Signed)
Definite big improvement on Fasenra. Minimal chest congestion recently may be from weather. Prednisone tapered to 5 mg. Now noting stiffness and aching in knees which might be adrenal insufficiency. He thinks cervical nodes swell occasionally- not noted today. We will get CXR to look for mediastinal adenopathy. Plan- hold prednisone at 5 mg- 7.5 mg daily for a month or so, watching knee stiffness, chest congestion. We can request Endocrine help with further steroid weaning if needed. CXR

## 2019-02-16 NOTE — Progress Notes (Signed)
All questions were answered by the patient before medication was administered. Have you been hospitalized in the last 10 days? No Do you have a fever? No Do you have a cough? No Do you have a headache or sore throat? No  

## 2019-02-17 ENCOUNTER — Telehealth: Payer: Self-pay | Admitting: Internal Medicine

## 2019-02-17 NOTE — Telephone Encounter (Signed)
Returned call to patient and notified of cxr. Documented under result note. Nothing further needed.

## 2019-03-04 ENCOUNTER — Other Ambulatory Visit: Payer: Self-pay | Admitting: Family Medicine

## 2019-03-04 ENCOUNTER — Other Ambulatory Visit: Payer: Self-pay | Admitting: Internal Medicine

## 2019-03-04 ENCOUNTER — Other Ambulatory Visit: Payer: Self-pay

## 2019-03-04 DIAGNOSIS — Z20822 Contact with and (suspected) exposure to covid-19: Secondary | ICD-10-CM

## 2019-03-06 LAB — NOVEL CORONAVIRUS, NAA: SARS-CoV-2, NAA: NOT DETECTED

## 2019-03-10 ENCOUNTER — Other Ambulatory Visit: Payer: Self-pay | Admitting: Internal Medicine

## 2019-04-05 ENCOUNTER — Telehealth: Payer: Self-pay | Admitting: Internal Medicine

## 2019-04-05 NOTE — Telephone Encounter (Signed)
[name] Order: 30mg  #1 prefilled syringe Ordered date: 04/05/2019 Expected date of arrival: 04/09/2019 Ordered by: Desmond Dike, [place]  Specialty Pharmacy: [facility]

## 2019-04-08 DIAGNOSIS — J4550 Severe persistent asthma, uncomplicated: Secondary | ICD-10-CM | POA: Diagnosis not present

## 2019-04-09 NOTE — Telephone Encounter (Signed)
Fasenra Shipment Received:  30mg  #1 prefilled syringe Medication arrival date: 04/09/2019 Lot #: [id number] Exp date: 04/2020 Received by: Peggyann Shoals, RN

## 2019-04-10 ENCOUNTER — Other Ambulatory Visit: Payer: Self-pay | Admitting: Internal Medicine

## 2019-04-12 ENCOUNTER — Telehealth: Payer: Self-pay | Admitting: Internal Medicine

## 2019-04-12 NOTE — Telephone Encounter (Signed)
Spoke with pt's wife. Advised her that we received the pt's medication on Friday. Nothing further was needed.

## 2019-04-13 ENCOUNTER — Ambulatory Visit (INDEPENDENT_AMBULATORY_CARE_PROVIDER_SITE_OTHER): Payer: BC Managed Care – PPO

## 2019-04-13 ENCOUNTER — Other Ambulatory Visit: Payer: Self-pay

## 2019-04-13 DIAGNOSIS — J4550 Severe persistent asthma, uncomplicated: Secondary | ICD-10-CM

## 2019-04-13 MED ORDER — BENRALIZUMAB 30 MG/ML ~~LOC~~ SOSY
30.0000 mg | PREFILLED_SYRINGE | Freq: Once | SUBCUTANEOUS | Status: AC
  Administered 2019-04-13: 30 mg via SUBCUTANEOUS

## 2019-04-13 NOTE — Progress Notes (Signed)
Have you been hospitalized within the last 10 days?  No Do you have a fever?  No Do you have a cough?  No Do you have a headache or sore throat? No Do you have your Epi Pen visible and is it within date?  Yes 

## 2019-05-28 ENCOUNTER — Telehealth: Payer: Self-pay | Admitting: Internal Medicine

## 2019-05-28 ENCOUNTER — Ambulatory Visit: Payer: BC Managed Care – PPO | Admitting: Family

## 2019-05-28 NOTE — Telephone Encounter (Signed)
Spoke with pt. He is wanting to know if his [name] had been ordered and received. Advised him that we order medication a week prior to the pt's appointment. He has a pending injection appointment on 06/08/2019. Advised him that his medication would be ordered next week and should arrive before his appointment. Nothing further was needed at this time.

## 2019-05-31 ENCOUNTER — Telehealth: Payer: Self-pay | Admitting: Internal Medicine

## 2019-05-31 NOTE — Telephone Encounter (Signed)
Called [facility] to set up [place] shipment. Was advised that the department I needed to speak with was not answering their line. Will call back later this week to set up shipment.

## 2019-06-01 ENCOUNTER — Other Ambulatory Visit: Payer: Self-pay

## 2019-06-01 ENCOUNTER — Encounter: Payer: Self-pay | Admitting: Family

## 2019-06-01 ENCOUNTER — Ambulatory Visit (INDEPENDENT_AMBULATORY_CARE_PROVIDER_SITE_OTHER): Payer: BC Managed Care – PPO | Admitting: Family

## 2019-06-01 VITALS — BP 142/80 | HR 100 | Temp 98.7°F | Ht 70.0 in | Wt 289.4 lb

## 2019-06-01 DIAGNOSIS — L989 Disorder of the skin and subcutaneous tissue, unspecified: Secondary | ICD-10-CM | POA: Diagnosis not present

## 2019-06-01 DIAGNOSIS — Z1211 Encounter for screening for malignant neoplasm of colon: Secondary | ICD-10-CM | POA: Diagnosis not present

## 2019-06-01 DIAGNOSIS — Z125 Encounter for screening for malignant neoplasm of prostate: Secondary | ICD-10-CM

## 2019-06-01 DIAGNOSIS — Z0000 Encounter for general adult medical examination without abnormal findings: Secondary | ICD-10-CM

## 2019-06-01 DIAGNOSIS — Z23 Encounter for immunization: Secondary | ICD-10-CM

## 2019-06-01 DIAGNOSIS — Z13220 Encounter for screening for lipoid disorders: Secondary | ICD-10-CM | POA: Diagnosis not present

## 2019-06-01 DIAGNOSIS — Z1321 Encounter for screening for nutritional disorder: Secondary | ICD-10-CM

## 2019-06-01 NOTE — Progress Notes (Signed)
[name] [name] is a 53 y.o. male with the following history as recorded in EpicCare:  Patient Active Problem List   Diagnosis Date Noted  . Acute kidney injury ([place]) 08/08/2015  . Hyperkalemia 08/08/2015  . Acute pyelonephritis   . Left ureteral stone 08/07/2015  . Pyonephrosis 08/07/2015  . Acute renal insufficiency 08/07/2015  . Nonallopathic lesion of thoracic region 03/15/2015  . Lateral epicondylitis of right elbow 02/22/2015  . Obstructive sleep apnea 01/28/2015  . Triceps tendinitis 01/11/2015  . Back pain 12/21/2014  . Anxiety state, unspecified 02/06/2014  . Thrush of mouth and esophagus ([place]) 06/20/2011  . Sinusitis, maxillary, chronic 11/18/2010  . ALLERGIC CONJUNCTIVITIS 08/25/2007  . Seasonal and perennial allergic rhinitis 08/25/2007  . Asthma-chronic obstructive pulmonary disease overlap syndrome ([place]) 08/25/2007    Current Outpatient Medications  Medication Sig Dispense Refill  . benzonatate (TESSALON) 200 MG capsule Take 1 capsule (200 mg total) by mouth 3 (three) times daily as needed for cough. 30 capsule 1  . budesonide-formoterol (SYMBICORT) 160-4.5 MCG/ACT inhaler INHALE 2 PUFFS INTO THE LUNGS TWICE A DAY RINSE MOUTH AFTER USE 30.6 Inhaler 2  . clorazepate (TRANXENE) 7.5 MG tablet TAKE 1 TABLET BY MOUTH TWICE A DAY AS NEEDED 60 tablet 5  . clotrimazole (MYCELEX) 10 MG troche Take 1 tablet (10 mg total) by mouth 5 (five) times daily. 35 tablet 0  . cyclobenzaprine (FEXMID) 7.5 MG tablet Take 7.5 mg by mouth 3 (three) times daily as needed for muscle spasms.    . fluconazole (DIFLUCAN) 100 MG tablet Take 1 tablet (100 mg total) by mouth daily. 10 tablet 0  . fluticasone (FLONASE) 50 MCG/ACT nasal spray Place 2 sprays into both nostrils daily. 16 g 11  . ibuprofen (ADVIL,MOTRIN) 800 MG tablet Take 800 mg by mouth every 8 (eight) hours as needed.    [name] ipratropium-albuterol (DUONEB) 0.5-2.5 (3) MG/3ML SOLN INHALE 1 VIAL VIA NEBULIZER 4 TIMES A DAY AS NEEDED 1080  mL 3  . montelukast (SINGULAIR) 10 MG tablet 1 daily 90 tablet 3  . Nebulizers (COMPRESSOR NEBULIZER) MISC 1 Device by Does not apply route 4 (four) times daily as needed. 1 each 0  . nystatin (MYCOSTATIN) 100000 UNIT/ML suspension Take 5 mLs (500,000 Units total) by mouth 4 (four) times daily. Swish and swallow 60 mL 2  . predniSONE (DELTASONE) 5 MG tablet TAKE 2 OR 3 TABLETS BY MOUTH DAILY AS DIRECTED 90 tablet 1  . Spacer/[name]-Holding Chambers (AEROCHAMBER MV) inhaler Use as instructed 1 each 0  . albuterol (VENTOLIN HFA) 108 (90 Base) MCG/ACT inhaler Inhale 2 puffs into the lungs every 6 (six) hours as needed for wheezing or shortness of breath. 1 Inhaler 5  . EPINEPHrine 0.3 mg/0.3 mL IJ SOAJ injection Inject 0.3 mLs (0.3 mg total) into the muscle once for 1 dose. 1 Device 11   No current facility-administered medications for this visit.     Allergies: Fluticasone-salmeterol  Past Medical History:  Diagnosis Date  . Allergic rhinitis   . Complication of anesthesia    difficulty waking up from lithotripsy and nasal surgery  . History of chronic bronchitis   . History of kidney stones   . Left ureteral stone   . Mild obstructive sleep apnea    per study 02-09-2015  recommended oral appliance / cpap-- pt tried intolerant  . Moderate persistent asthma    pulmologist-  dr young    Past Surgical History:  Procedure Laterality Date  . CYSTO/  LEFT URETERAL STENT  PLACEMENT  03-10-2002  . CYSTOSCOPY W/ URETERAL STENT PLACEMENT Left 08/07/2015   Procedure: CYSTOSCOPY, Left Retrograde Pyelogram, Left Ureteral Stent Placement;  Surgeon: Irine Seal, MD;  Location: [facility];  Service: Urology;  Laterality: Left;  . CYSTOSCOPY/URETEROSCOPY/HOLMIUM LASER/STENT PLACEMENT Left 08/17/2015   Procedure: CYSTOSCOPY / LEFT URETEROSCOPY / HOLMIUM LASER LITHOTRIPSY;  Surgeon: Irine Seal, MD;  Location: [facility];  Service: Urology;  Laterality: Left;  . EXTRACORPOREAL SHOCK WAVE LITHOTRIPSY   2006  . SEPTOPLASTY WITH ETHMOIDECTOMY, AND MAXILLARY ANTROSTOMY  09-24-2010  . STONE EXTRACTION WITH BASKET Left 08/17/2015   Procedure: STONE EXTRACTION WITH BASKET;  Surgeon: Irine Seal, MD;  Location: [facility];  Service: Urology;  Laterality: Left;  . WISDOM TOOTH EXTRACTION  1994    Family History  Problem Relation Age of Onset  . Asthma Father   . Other Father        bronchitis    Social History   Tobacco Use  . Smoking status: Never Smoker  . Smokeless tobacco: Never Used  Substance Use Topics  . Alcohol use: Yes    Comment: socially    Subjective:  Patient presents today as a new patient; would like to get yearly CPE; Majority of healthcare needs have been managed by pulmonologist;  Needs to get baseline colonoscopy ordered;  Requesting referral to dermatology for skin check- concerned about abnormal lesion on side of left cheek.  Recently saw Dr. Sabra Heck for eye exam- being evaluated for elevated eye pressure; Feels like has been able to be more active since being started on Fesinra per pulmonologist;  Does have sleep apnea- cannot tolerate CPAP but notes he is sleeping well;   Review of Systems  Constitutional: Negative.   HENT: Negative.   Eyes: Negative.   Respiratory: Positive for cough.        Chronic asthma  Cardiovascular: Negative for chest pain.  Gastrointestinal: Negative.   Genitourinary: Negative.   Musculoskeletal: Negative.   Neurological: Negative.   Endo/Heme/Allergies: Bruises/bleeds easily.       On chronic prednisone  Psychiatric/Behavioral: Negative.       Objective:  Vitals:   06/01/19 1037  BP: (!) 142/80  Pulse: 100  Temp: 98.7 F (37.1 C)  TempSrc: Oral  SpO2: 97%  Weight: 289 lb 6.4 oz (131.3 kg)  Height: 5' 10"  (1.778 m)    General: Well developed, well nourished, in no acute distress  Skin : Warm and dry.  Head: Normocephalic and atraumatic  Eyes: Sclera and conjunctiva clear; pupils round and reactive  to light; extraocular movements intact  Ears: External normal; canals clear; tympanic membranes normal  Oropharynx: Pink, supple. No suspicious lesions  Neck: Supple without thyromegaly, adenopathy  Lungs: Respirations unlabored; clear to auscultation bilaterally without wheeze, rales, rhonchi  CVS exam: normal rate and regular rhythm.  Abdomen: Soft; nontender; nondistended; normoactive bowel sounds; no masses or hepatosplenomegaly  Musculoskeletal: No deformities; no active joint inflammation  Extremities: No edema, cyanosis, clubbing  Vessels: Symmetric bilaterally  Neurologic: Alert and oriented; speech intact; face symmetrical; moves all extremities well; CNII-XII intact without focal deficit   Assessment:  1. PE (physical exam), annual   2. Encounter for screening colonoscopy   3. Skin lesion of face   4. Lipid screening   5. Encounter for vitamin deficiency screening   6. Prostate cancer screening     Plan:  Age appropriate preventive healthcare needs addressed; encouraged regular eye doctor and dental exams; encouraged regular exercise and weight loss;  patient is not fasting this morning and will return for fasting labs at a later date- follow-up to be determined based on labs; Tdap and flu updated today; order for colonoscopy updated; referral to dermatology; Will need to continue to monitor blood pressure- as patient's breathing is improving, he is losing weight and becoming more active- this may positively affect his blood pressure.   No follow-ups on file.  Orders Placed This Encounter  Procedures  . CBC w/Diff    Standing Status:   Future    Standing Expiration Date:   05/31/2020  . Comp Met (CMET)    Standing Status:   Future    Standing Expiration Date:   05/31/2020  . Lipid panel    Standing Status:   Future    Standing Expiration Date:   05/31/2020  . Vitamin D (25 hydroxy)    Standing Status:   Future    Standing Expiration Date:   05/31/2020  . PSA     Standing Status:   Future    Standing Expiration Date:   05/31/2020  . Ambulatory referral to Gastroenterology    Referral Priority:   Routine    Referral Type:   Consultation    Referral Reason:   Specialty Services Required    Number of Visits Requested:   1  . Ambulatory referral to Dermatology    Referral Priority:   Routine    Referral Type:   Consultation    Referral Reason:   Specialty Services Required    Requested Specialty:   Dermatology    Number of Visits Requested:   1    Requested Prescriptions    No prescriptions requested or ordered in this encounter

## 2019-06-01 NOTE — Addendum Note (Signed)
Addended by: Marcina Millard on: 06/01/2019 12:06 PM   Modules accepted: Orders

## 2019-06-03 ENCOUNTER — Telehealth: Payer: Self-pay | Admitting: *Deleted

## 2019-06-03 ENCOUNTER — Encounter: Payer: Self-pay | Admitting: Internal Medicine

## 2019-06-03 NOTE — Telephone Encounter (Signed)
Patient states 2 people tested positive for Covid at his work.  Would like to know if he should go back to work on Saturday.  Getting Covid test tomorrow.  Patient phone number is [phone] h or [phone] c.

## 2019-06-03 NOTE — Telephone Encounter (Signed)
Recommend Covid swab today so that can get started. Ok to keep appointment

## 2019-06-03 NOTE — Telephone Encounter (Signed)
Spoke with patient. He stated that 2 more people at his desk have tested positive for COVID. He has an appt to be tested for COVID tomorrow (November 13th). He wanted to know if he could return back to work on Saturday [facility] 14th). Advised him that normally when patient are awaiting test results, they ask them to quarantine until the results are available. He stated that he was ok with this, but he would need a letter stating that he needs to remain out of work until his results are available. He also stated that he was advised earlier this year that he was in a high risk category of developing COVID complications.   CY, please advise. Thanks!

## 2019-06-03 NOTE — Telephone Encounter (Signed)
Ok to send him a letter advising him to stay out of work and self-quarantine until [employer] returns.  Even if he tests negative, if he develops symptoms in next few day, let us know.  If he were to get very sick- especially with breathing problems, go to ER.

## 2019-06-03 NOTE — Telephone Encounter (Signed)
Pt scheduled for f/u tomorrow. I called to do covid screening Pt answered yes to being in contact with someone who tested positive 2 weeks ago in the office. He says the receptionist tested positive. He went on vacation and found out once he returned to office. She was out of work by then. Is it ok to proceed with in office visit. He does not have any symptoms.

## 2019-06-03 NOTE — Telephone Encounter (Signed)
Spoke with patient. He is aware of CY recs. Advised him that I would go ahead and type the letter. He stated that he has access to MyChart but not able to print the letter. Asked if I could email it to him. Advised him that normally we do not do this but I would help him this one time. He verbalized understanding.

## 2019-06-04 ENCOUNTER — Ambulatory Visit: Payer: BC Managed Care – PPO | Admitting: Internal Medicine

## 2019-06-04 ENCOUNTER — Other Ambulatory Visit: Payer: Self-pay

## 2019-06-04 DIAGNOSIS — Z20822 Contact with and (suspected) exposure to covid-19: Secondary | ICD-10-CM

## 2019-06-04 DIAGNOSIS — J4550 Severe persistent asthma, uncomplicated: Secondary | ICD-10-CM | POA: Diagnosis not present

## 2019-06-04 NOTE — Telephone Encounter (Signed)
[name] Order: 30mg  #1 prefilled syringe Ordered date: 06/04/2019 Expected date of arrival: 06/08/2019 Ordered by: Desmond Dike, [place]  Specialty Pharmacy: [facility]

## 2019-06-07 LAB — NOVEL CORONAVIRUS, NAA: SARS-CoV-2, NAA: NOT DETECTED

## 2019-06-08 ENCOUNTER — Ambulatory Visit: Payer: BC Managed Care – PPO

## 2019-06-08 NOTE — Telephone Encounter (Signed)
Fasenra Shipment Received:  30mg  #1 prefilled syringe Medication arrival date: 06/08/19 Lot #: [id number] Exp date: 06/21/2020 Received by: Elliot Dally

## 2019-06-09 ENCOUNTER — Ambulatory Visit (INDEPENDENT_AMBULATORY_CARE_PROVIDER_SITE_OTHER): Payer: BC Managed Care – PPO

## 2019-06-09 ENCOUNTER — Other Ambulatory Visit: Payer: Self-pay

## 2019-06-09 ENCOUNTER — Ambulatory Visit: Payer: BC Managed Care – PPO

## 2019-06-09 DIAGNOSIS — J4550 Severe persistent asthma, uncomplicated: Secondary | ICD-10-CM | POA: Diagnosis not present

## 2019-06-09 MED ORDER — BENRALIZUMAB 30 MG/ML ~~LOC~~ SOSY
30.0000 mg | PREFILLED_SYRINGE | Freq: Once | SUBCUTANEOUS | Status: AC
  Administered 2019-06-09: 30 mg via SUBCUTANEOUS

## 2019-06-09 NOTE — Progress Notes (Signed)
Have you been hospitalized within the last 10 days?  No Do you have a fever?  No Do you have a cough?  No Do you have a headache or sore throat? No Do you have your Epi Pen visible and is it within date?  Yes 

## 2019-06-10 ENCOUNTER — Other Ambulatory Visit (INDEPENDENT_AMBULATORY_CARE_PROVIDER_SITE_OTHER): Payer: BC Managed Care – PPO

## 2019-06-10 DIAGNOSIS — Z0000 Encounter for general adult medical examination without abnormal findings: Secondary | ICD-10-CM | POA: Diagnosis not present

## 2019-06-10 DIAGNOSIS — Z13220 Encounter for screening for lipoid disorders: Secondary | ICD-10-CM | POA: Diagnosis not present

## 2019-06-10 DIAGNOSIS — Z1321 Encounter for screening for nutritional disorder: Secondary | ICD-10-CM | POA: Diagnosis not present

## 2019-06-10 DIAGNOSIS — Z125 Encounter for screening for malignant neoplasm of prostate: Secondary | ICD-10-CM

## 2019-06-10 LAB — CBC WITH DIFFERENTIAL/PLATELET
Basophils Absolute: 0.1 10*3/uL (ref 0.0–0.1)
Basophils Relative: 0.8 % (ref 0.0–3.0)
Eosinophils Absolute: 0 10*3/uL (ref 0.0–0.7)
Eosinophils Relative: 0 % (ref 0.0–5.0)
HCT: 45.9 % (ref 39.0–52.0)
Hemoglobin: 15.2 g/dL (ref 13.0–17.0)
Lymphocytes Relative: 25.3 % (ref 12.0–46.0)
Lymphs Abs: 2 10*3/uL (ref 0.7–4.0)
MCHC: 33.1 g/dL (ref 30.0–36.0)
MCV: 90.1 fl (ref 78.0–100.0)
Monocytes Absolute: 1 10*3/uL (ref 0.1–1.0)
Monocytes Relative: 12.6 % — ABNORMAL HIGH (ref 3.0–12.0)
Neutro Abs: 4.8 10*3/uL (ref 1.4–7.7)
Neutrophils Relative %: 61.3 % (ref 43.0–77.0)
Platelets: 219 10*3/uL (ref 150.0–400.0)
RBC: 5.09 Mil/uL (ref 4.22–5.81)
RDW: 14.8 % (ref 11.5–15.5)
WBC: 7.8 10*3/uL (ref 4.0–10.5)

## 2019-06-10 LAB — PSA: PSA: 2.25 ng/mL (ref 0.10–4.00)

## 2019-06-10 LAB — COMPREHENSIVE METABOLIC PANEL
ALT: 23 U/L (ref 0–53)
AST: 16 U/L (ref 0–37)
Albumin: 4.1 g/dL (ref 3.5–5.2)
Alkaline Phosphatase: 78 U/L (ref 39–117)
BUN: 20 mg/dL (ref 6–23)
CO2: 27 mEq/L (ref 19–32)
Calcium: 8.7 mg/dL (ref 8.4–10.5)
Chloride: 105 mEq/L (ref 96–112)
Creatinine, Ser: 1.18 mg/dL (ref 0.40–1.50)
GFR: 64.5 mL/min (ref >60.00)
Glucose, Bld: 98 mg/dL (ref 70–99)
Potassium: 4.4 mEq/L (ref 3.5–5.1)
Sodium: 141 mEq/L (ref 135–145)
Total Bilirubin: 0.4 mg/dL (ref 0.2–1.2)
Total Protein: 6.6 g/dL (ref 6.0–8.3)

## 2019-06-10 LAB — LIPID PANEL
Cholesterol: 154 mg/dL (ref 0–200)
HDL: 36 mg/dL — ABNORMAL LOW (ref >39.00)
LDL Cholesterol: 95 mg/dL (ref 0–99)
NonHDL: 118.3
Total CHOL/HDL Ratio: 4
Triglycerides: 117 mg/dL (ref 0.0–149.0)
VLDL: 23.4 mg/dL (ref 0.0–40.0)

## 2019-06-10 LAB — VITAMIN D 25 HYDROXY (VIT D DEFICIENCY, FRACTURES): VITD: 30.41 ng/mL (ref 30.00–100.00)

## 2019-06-16 ENCOUNTER — Ambulatory Visit: Payer: BC Managed Care – PPO | Admitting: Internal Medicine

## 2019-06-16 ENCOUNTER — Other Ambulatory Visit: Payer: Self-pay

## 2019-06-16 DIAGNOSIS — Z20822 Contact with and (suspected) exposure to covid-19: Secondary | ICD-10-CM

## 2019-06-18 LAB — NOVEL CORONAVIRUS, NAA: SARS-CoV-2, NAA: NOT DETECTED

## 2019-06-24 ENCOUNTER — Other Ambulatory Visit: Payer: Self-pay | Admitting: Internal Medicine

## 2019-06-24 ENCOUNTER — Telehealth: Payer: Self-pay | Admitting: Internal Medicine

## 2019-06-24 MED ORDER — CLORAZEPATE DIPOTASSIUM 7.5 MG PO TABS: 7.5000 mg | ORAL_TABLET | Freq: Two times a day (BID) | ORAL | 5 refills | Status: DC | PRN

## 2019-06-24 NOTE — Telephone Encounter (Signed)
Called and spoke with pt letting him know CY sent med refill to pharmacy for him. Pt verbalized understanding. Nothing further needed.

## 2019-06-24 NOTE — Telephone Encounter (Signed)
Pt calling requesting a refill of his clorazepate. Dr. Annamaria Boots, please advise if you are okay refilling med for pt.   Allergies  Allergen Reactions  . Fluticasone-Salmeterol Other (See Comments)    REACTION: jaw joint pain     Current Outpatient Medications:  .  albuterol (VENTOLIN HFA) 108 (90 Base) MCG/ACT inhaler, Inhale 2 puffs into the lungs every 6 (six) hours as needed for wheezing or shortness of breath., Disp: 1 Inhaler, Rfl: 5 .  benzonatate (TESSALON) 200 MG capsule, Take 1 capsule (200 mg total) by mouth 3 (three) times daily as needed for cough., Disp: 30 capsule, Rfl: 1 .  budesonide-formoterol (SYMBICORT) 160-4.5 MCG/ACT inhaler, INHALE 2 PUFFS INTO THE LUNGS TWICE A DAY RINSE MOUTH AFTER USE, Disp: 30.6 Inhaler, Rfl: 2 .  clorazepate (TRANXENE) 7.5 MG tablet, TAKE 1 TABLET BY MOUTH TWICE A DAY AS NEEDED, Disp: 60 tablet, Rfl: 5 .  clotrimazole (MYCELEX) 10 MG troche, Take 1 tablet (10 mg total) by mouth 5 (five) times daily., Disp: 35 tablet, Rfl: 0 .  cyclobenzaprine (FEXMID) 7.5 MG tablet, Take 7.5 mg by mouth 3 (three) times daily as needed for muscle spasms., Disp: , Rfl:  .  EPINEPHrine 0.3 mg/0.3 mL IJ SOAJ injection, Inject 0.3 mLs (0.3 mg total) into the muscle once for 1 dose., Disp: 1 Device, Rfl: 11 .  fluconazole (DIFLUCAN) 100 MG tablet, Take 1 tablet (100 mg total) by mouth daily., Disp: 10 tablet, Rfl: 0 .  fluticasone (FLONASE) 50 MCG/ACT nasal spray, Place 2 sprays into both nostrils daily., Disp: 16 g, Rfl: 11 .  ibuprofen (ADVIL,MOTRIN) 800 MG tablet, Take 800 mg by mouth every 8 (eight) hours as needed., Disp: , Rfl:  .  ipratropium-albuterol (DUONEB) 0.5-2.5 (3) MG/3ML SOLN, INHALE 1 VIAL VIA NEBULIZER 4 TIMES A DAY AS NEEDED, Disp: 1080 mL, Rfl: 3 .  montelukast (SINGULAIR) 10 MG tablet, 1 daily, Disp: 90 tablet, Rfl: 3 .  Nebulizers (COMPRESSOR NEBULIZER) MISC, 1 Device by Does not apply route 4 (four) times daily as needed., Disp: 1 each, Rfl: 0 .   nystatin (MYCOSTATIN) 100000 UNIT/ML suspension, Take 5 mLs (500,000 Units total) by mouth 4 (four) times daily. Swish and swallow, Disp: 60 mL, Rfl: 2 .  predniSONE (DELTASONE) 5 MG tablet, TAKE 2 OR 3 TABLETS BY MOUTH DAILY AS DIRECTED, Disp: 90 tablet, Rfl: 1 .  Spacer/[name]-Holding Chambers (AEROCHAMBER MV) inhaler, Use as instructed, Disp: 1 each, Rfl: 0

## 2019-06-24 NOTE — Telephone Encounter (Signed)
Clorazepate refill e-sent

## 2019-07-01 ENCOUNTER — Encounter: Payer: Self-pay | Admitting: Adult Health

## 2019-07-01 ENCOUNTER — Ambulatory Visit (INDEPENDENT_AMBULATORY_CARE_PROVIDER_SITE_OTHER): Payer: BC Managed Care – PPO | Admitting: Adult Health

## 2019-07-01 ENCOUNTER — Other Ambulatory Visit: Payer: Self-pay

## 2019-07-01 VITALS — BP 128/82 | HR 88 | Temp 98.8°F | Ht 70.0 in | Wt 290.8 lb

## 2019-07-01 DIAGNOSIS — L249 Irritant contact dermatitis, unspecified cause: Secondary | ICD-10-CM | POA: Diagnosis not present

## 2019-07-01 DIAGNOSIS — L259 Unspecified contact dermatitis, unspecified cause: Secondary | ICD-10-CM | POA: Insufficient documentation

## 2019-07-01 DIAGNOSIS — J449 Chronic obstructive pulmonary disease, unspecified: Secondary | ICD-10-CM

## 2019-07-01 MED ORDER — PREDNISONE 10 MG PO TABS: ORAL_TABLET | ORAL | 0 refills | Status: DC

## 2019-07-01 MED ORDER — ALBUTEROL SULFATE HFA 108 (90 BASE) MCG/ACT IN AERS: 2.0000 | INHALATION_SPRAY | Freq: Four times a day (QID) | RESPIRATORY_TRACT | 5 refills | Status: DC | PRN

## 2019-07-01 MED ORDER — MONTELUKAST SODIUM 10 MG PO TABS: ORAL_TABLET | ORAL | 1 refills | Status: DC

## 2019-07-01 MED ORDER — DESOXIMETASONE 0.25 % EX CREA: 1.0000 "application " | TOPICAL_CREAM | Freq: Every day | CUTANEOUS | 1 refills | Status: AC

## 2019-07-01 NOTE — Patient Instructions (Addendum)
Wash areas well with gentle soap, rinse and pat dry  May use cool compresses  Use Topicort cream daily for 1 week  Prednisone taper over next week then resume 5mg  daily .  May use Lotrimin cream to patches on stomach as directed.  Refer to Dermatology .  Continue on Symbicort 2 puffs Twice daily  , rinse after use.  Continue on Fasenra.  Follow up with Dr. Annamaria Boots  In 1 week and As needed   Please contact office for sooner follow up if symptoms do not improve or worsen or seek emergency care

## 2019-07-01 NOTE — Progress Notes (Signed)
@Patient  ID: [name] [name], male    DOB: [birth year], 53 y.o.   MRN: [id number]  Chief Complaint  Patient presents with   Acute Visit    Rash     Referring provider: Marrian Salvage,*  HPI: 53 year old male never smoker followed for chronic obstructive asthma, chronic rhinitis and chronic polypoid sinusitis.-On chronic steroids with prednisone 5 mg Obstructive sleep apnea CPAP intolerant  TEST/EVENTS :  Unattended Home Sleep Test 02/09/15- mild OSA/ AHI 9.3/ hr, desat to 85%, weight 276 lbs PFT 03/06/2007 PFT 01/29/2011 Office Spirometry 02/03/2014-severe obstructive airways disease with low FVC.  FVC 3.13/62%, FEV1 2.01/50%, ratio 0.64,  FEF 25-75% 0.87/22% CT sinus 11/23/2009-moderate chronic appearing pansinusitis. Surgery 09/24/2010-polypoid chronic sinusitis CBC with differential 12/28/2015-eosinophils 7.3% CBC w diff 09/28/2018- steroid pattern EOS 0.2 k/ul FENO 10/02/16- 33 H Office spirometry 10/02/16-moderate restriction and obstruction. FVC 2.66/53%, FEV1 2.0/51%, ratio 0.75, FEF 25-75% 1.60/46% IgE 10/02/2016-295 PFT 01/29/2019- Mod obst, mild restrction, slight response to dilator, normal Diffusion  Fasenra started 10/2018  (significant improvement)   07/01/2019 Acute OV : Rash  Patient presents for an acute office visit.  Patient has underlying chronic obstructive asthma/severe persistent asthma.  Patient says he has been doing very well with his asthma.  He started on Fasenra earlier this year and since then has been doing much better.  He denies any increased cough wheezing.  No increased albuterol use.  Patient remains on Symbicort twice daily.  Singulair daily.  And has decreased his prednisone currently on 5 mg daily. Patient says that he got a Tdap and flu vaccine on June 01, 2019.  He had no known complications.  He received his Fasenra injection on November 18 with no known complications.  Patient says he was [place] vacation at the beach and noticed that he had some  itching on his right arm.  This was around December 4.  Since then patient says he has had significant irritation and rash along his right arm under his right axilla area.  He is placed hand sanitizer, alcohol and several other over-the-counter antiitch products on his arm without any significant improvement.  He does notice a few areas along his left arm but no significant pruritus and is also noticed some itchy areas along his shoulder blades. He has had no new medications.  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  He has had no new products to use at work.  Has no new products at home.  He is aware of.  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 Patient denies any painful lesions.  Denies any new skin products, soap, detergent.  Denies any tongue or throat swelling.  Denies any lesions on his legs or torso.  Patient says he did have some circular lesions on his upper abdomen that is been there for a while and his primary care provider told him to use some over-the-counter Lotrimin. Patient denies any chest pain, shortness of breath, wheezing, cough, fever, discolored mucus.,  Loss of taste or smell.  Patient says he feels well. Patient says he has been scratching his arms a lot and is caused some irritation.    Allergies  Allergen Reactions   Fluticasone-Salmeterol Other (See Comments)    REACTION: jaw joint pain    Immunization History  Administered Date(s) Administered   Influenza Split 06/19/2011, 07/22/2012, 06/21/2013   Influenza Whole 04/21/2008   Influenza,inj,Quad PF,6+  Mos 04/11/2015, 05/23/2016, 04/09/2017, 05/28/2018, 06/01/2019   Pneumococcal Conjugate-13 05/28/2018   Pneumococcal Polysaccharide-23 07/27/2014   Tdap 06/01/2019    Past Medical History:  Diagnosis Date   Allergic rhinitis    Complication of anesthesia    difficulty waking up from lithotripsy and nasal surgery   History of  chronic bronchitis    History of kidney stones    Left ureteral stone    Mild obstructive sleep apnea    per study 02-09-2015  recommended oral appliance / cpap-- pt tried intolerant   Moderate persistent asthma    pulmologist-  dr young    Tobacco History: Social History   Tobacco Use  Smoking Status Never Smoker  Smokeless Tobacco Never Used   Counseling given: Not Answered   Outpatient Medications Prior to Visit  Medication Sig Dispense Refill   benzonatate (TESSALON) 200 MG capsule Take 1 capsule (200 mg total) by mouth 3 (three) times daily as needed for cough. 30 capsule 1   budesonide-formoterol (SYMBICORT) 160-4.5 MCG/ACT inhaler INHALE 2 PUFFS INTO THE LUNGS TWICE A DAY RINSE MOUTH AFTER USE 30.6 Inhaler 2   clorazepate (TRANXENE) 7.5 MG tablet Take 1 tablet (7.5 mg total) by mouth 2 (two) times daily as needed. 60 tablet 5   EPINEPHrine 0.3 mg/0.3 mL IJ SOAJ injection Inject 0.3 mLs (0.3 mg total) into the muscle once for 1 dose. 1 Device 11   fluticasone (FLONASE) 50 MCG/ACT nasal spray Place 2 sprays into both nostrils daily. 16 g 11   ibuprofen (ADVIL,MOTRIN) 800 MG tablet Take 800 mg by mouth every 8 (eight) hours as needed.     ipratropium-albuterol (DUONEB) 0.5-2.5 (3) MG/3ML SOLN INHALE 1 VIAL VIA NEBULIZER 4 TIMES A DAY AS NEEDED 1080 mL 3   Nebulizers (COMPRESSOR NEBULIZER) MISC 1 Device by Does not apply route 4 (four) times daily as needed. 1 each 0   nystatin (MYCOSTATIN) 100000 UNIT/ML suspension Take 5 mLs (500,000 Units total) by mouth 4 (four) times daily. Swish and swallow 60 mL 2   predniSONE (DELTASONE) 5 MG tablet TAKE 2 OR 3 TABLETS BY MOUTH DAILY AS DIRECTED 90 tablet 1   Spacer/[name]-Holding Chambers (AEROCHAMBER MV) inhaler Use as instructed 1 each 0   albuterol (VENTOLIN HFA) 108 (90 Base) MCG/ACT inhaler Inhale 2 puffs into the lungs every 6 (six) hours as needed for wheezing or shortness of breath. 1 Inhaler 5   montelukast  (SINGULAIR) 10 MG tablet 1 daily 90 tablet 3   cyclobenzaprine (FEXMID) 7.5 MG tablet Take 7.5 mg by mouth 3 (three) times daily as needed for muscle spasms.     clotrimazole (MYCELEX) 10 MG troche Take 1 tablet (10 mg total) by mouth 5 (five) times daily. (Patient not taking: Reported on 07/01/2019) 35 tablet 0   fluconazole (DIFLUCAN) 100 MG tablet Take 1 tablet (100 mg total) by mouth daily. (Patient not taking: Reported on 07/01/2019) 10 tablet 0   No facility-administered medications prior to visit.     Review of Systems:   Constitutional:   No  weight loss, night sweats,  Fevers, chills, fatigue, or  lassitude.  HEENT:   No headaches,  Difficulty swallowing,  Tooth/dental problems, or  Sore throat,                No sneezing, itching, ear ache, nasal congestion, post nasal drip,   CV:  No chest pain,  Orthopnea, PND, swelling in lower extremities, anasarca, dizziness, palpitations, syncope.   GI  No heartburn, indigestion,  abdominal pain, nausea, vomiting, diarrhea, change in bowel habits, loss of appetite, bloody stools.   Resp: No shortness of breath with exertion or at rest.  No excess mucus, no productive cough,  No non-productive cough,  No coughing up of blood.  No change in color of mucus.  No wheezing.  No chest wall deformity  Skin: Positive rash  GU: no dysuria, change in color of urine, no urgency or frequency.  No flank pain, no hematuria   MS:  No joint pain or swelling.  No decreased range of motion.  No back pain.    Physical Exam  BP 128/82 (BP Location: Left Arm, Cuff Size: Large)    Pulse 88    Temp 98.8 F (37.1 C) (Temporal)    Ht 5\' 10"  (1.778 m)    Wt 290 lb 12.8 oz (131.9 kg)    SpO2 95% Comment: RA   BMI 41.73 kg/m   GEN: A/Ox3; pleasant , NAD, morbidly obese   HEENT:  [place]/AT,  NOSE-clear, THROAT-clear, no lesions, no postnasal drip or exudate noted.   NECK:  Supple w/ fair ROM; no JVD; normal carotid impulses w/o bruits; no thyromegaly or  nodules palpated; no lymphadenopathy.    RESP  Clear  P & A; w/o, wheezes/ rales/ or rhonchi. no accessory muscle use, no dullness to percussion  CARD:  RRR, no m/r/g, no peripheral edema, pulses intact, no cyanosis or clubbing.  GI:   Soft & nt; nml bowel sounds; no organomegaly or masses detected.   Musco: Warm bil, no deformities or joint swelling noted.   Neuro: alert, no focal deficits noted.    Skin: Significant papular outbreak along right forearm with multiple areas of excoriation.  No red streaking.  Upper arm without rash noted.  Along right axilla papular rash noted no vesicles noted.  No adenopathy palpated.  Along posterior torso/back few scattered papules noted along upper abdomen few circular lesions with central sparing Lower extremities without rash noted.  Lab Results:  CBC    Component Value Date/Time   WBC 7.8 06/10/2019 0846   RBC 5.09 06/10/2019 0846   HGB 15.2 06/10/2019 0846   HCT 45.9 06/10/2019 0846   PLT 219.0 06/10/2019 0846   MCV 90.1 06/10/2019 0846   MCH 29.7 08/08/2015 0530   MCHC 33.1 06/10/2019 0846   RDW 14.8 06/10/2019 0846   LYMPHSABS 2.0 06/10/2019 0846   MONOABS 1.0 06/10/2019 0846   EOSABS 0.0 06/10/2019 0846   BASOSABS 0.1 06/10/2019 0846    BMET    Component Value Date/Time   NA 141 06/10/2019 0846   K 4.4 06/10/2019 0846   CL 105 06/10/2019 0846   CO2 27 06/10/2019 0846   GLUCOSE 98 06/10/2019 0846   BUN 20 06/10/2019 0846   CREATININE 1.18 06/10/2019 0846   CALCIUM 8.7 06/10/2019 0846   GFRNONAA 48 (L) 08/09/2015 0450   GFRAA 55 (L) 08/09/2015 0450    BNP No results found for: BNP  ProBNP Imaging: No results found.  Benralizumab SOSY 30 mg    Date Action Dose Route User   06/09/2019 0853 Given 30 mg Subcutaneous (Left Arm) Elton Sin, LPN      PFT Results Latest Ref Rng & Units 08/07/2018  FVC-Pre L 2.90  FVC-Predicted Pre % 57  FVC-Post L 3.09  FVC-Predicted Post % 61  Pre FEV1/FVC % % 78  Post  FEV1/FCV % % 82  FEV1-Pre L 2.28  FEV1-Predicted Pre % 58  FEV1-Post L 2.55  DLCO UNC% % 99  DLCO COR %Predicted % 148  TLC L 5.45  TLC % Predicted % 78  RV % Predicted % 107    Lab Results  Component Value Date   NITRICOXIDE 33 10/02/2016        Assessment & Plan:   Contact dermatitis Contact/allergic dermatitis predominantly along the right arm and right axilla.  Patient does have a few other scattered lesions questionable if related.  Suspect circular lesions along the upper abdomen which have been present first some time are not related and may be representative of a tinea infection Area is not painful so doubt this is underlying herpes zoster/shingles Patient is 2 to 3 weeks out from flu, Tdap, [name] so doubt this is a medication reaction and he has no localized reaction at the site of injection  Will refer to dermatology.  Apply local supportive therapy including cool compresses.  Topicort and a prednisone taper was given.  Plan  Patient Instructions  Wash areas well with gentle soap, rinse and pat dry  May use cool compresses  Use Topicort cream daily for 1 week  Prednisone taper over next week then resume 5mg  daily .  May use Lotrimin cream to patches on stomach as directed.  Refer to Dermatology .  Continue on Symbicort 2 puffs Twice daily  , rinse after use.  Continue on Fasenra.  Follow up with Dr. 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  We will continue current regimen     Rexene Edison, NP 07/01/2019

## 2019-07-01 NOTE — Assessment & Plan Note (Signed)
Severe persistent asthma currently well controlled.  We will continue current regimen

## 2019-07-01 NOTE — Assessment & Plan Note (Addendum)
Contact/allergic dermatitis predominantly along the right arm and right axilla.  Patient does have a few other scattered lesions questionable if related.  Suspect circular lesions along the upper abdomen which have been present first some time are not related and may be representative of a tinea infection Area is not painful so doubt this is underlying herpes zoster/shingles Patient is 2 to 3 weeks out from flu, Tdap, [name] so doubt this is a medication reaction and he has no localized reaction at the site of injection  Will refer to dermatology.  Apply local supportive therapy including cool compresses.  Topicort and a prednisone taper was given.  Plan  Patient Instructions  Wash areas well with gentle soap, rinse and pat dry  May use cool compresses  Use Topicort cream daily for 1 week  Prednisone taper over next week then resume 5mg  daily .  May use Lotrimin cream to patches on stomach as directed.  Refer to Dermatology .  Continue on Symbicort 2 puffs Twice daily  , rinse after use.  Continue on Fasenra.  Follow up with Dr. Annamaria Boots  In 1 week and As needed   Please contact office for sooner follow up if symptoms do not improve or worsen or seek emergency care

## 2019-07-02 ENCOUNTER — Encounter: Payer: Self-pay | Admitting: Family

## 2019-07-07 DIAGNOSIS — L570 Actinic keratosis: Secondary | ICD-10-CM | POA: Diagnosis not present

## 2019-07-07 DIAGNOSIS — L819 Disorder of pigmentation, unspecified: Secondary | ICD-10-CM | POA: Diagnosis not present

## 2019-07-07 DIAGNOSIS — L249 Irritant contact dermatitis, unspecified cause: Secondary | ICD-10-CM | POA: Diagnosis not present

## 2019-07-07 DIAGNOSIS — L821 Other seborrheic keratosis: Secondary | ICD-10-CM | POA: Diagnosis not present

## 2019-07-09 ENCOUNTER — Other Ambulatory Visit: Payer: Self-pay

## 2019-07-09 ENCOUNTER — Ambulatory Visit (INDEPENDENT_AMBULATORY_CARE_PROVIDER_SITE_OTHER): Payer: BC Managed Care – PPO | Admitting: Internal Medicine

## 2019-07-09 ENCOUNTER — Encounter: Payer: Self-pay | Admitting: Internal Medicine

## 2019-07-09 DIAGNOSIS — G4733 Obstructive sleep apnea (adult) (pediatric): Secondary | ICD-10-CM | POA: Diagnosis not present

## 2019-07-09 DIAGNOSIS — J449 Chronic obstructive pulmonary disease, unspecified: Secondary | ICD-10-CM

## 2019-07-09 MED ORDER — PREDNISONE 1 MG PO TABS: ORAL_TABLET | ORAL | 1 refills | Status: DC

## 2019-07-09 MED ORDER — NYSTATIN 100000 UNIT/ML MT SUSP: 5.0000 mL | Freq: Four times a day (QID) | OROMUCOSAL | 2 refills | Status: DC

## 2019-07-09 NOTE — Patient Instructions (Signed)
Script sent for prednisone 1 mg tabs. Start tapering by 1 mg/ day per week.  Try using Head and Shoulders dandruff shampoo as a body wash instead of soap.  Continue the lotrimin  See if the rash gets better  Script sent for nystatin for thrush in mouth  Try using your Symbicort just before you eat breakfast and supper. See if that helps keep your mouth clear of yeast ("thrush").  Please call as needed.

## 2019-07-09 NOTE — Progress Notes (Signed)
Patient ID: [name] [name], male    DOB: 02-19-1966, 53 y.o.   MRN: [id number]  HPI male never smoker followed for chronic obstructive asthma, OSA/quit CPAP, rhinitis/chronic sinusitis Unattended Home Sleep Test 02/09/15- mild OSA/ AHI 9.3/ hr, desat to 85%, weight 276 lbs PFT 03/06/2007 PFT 01/29/2011 Office Spirometry 02/03/2014-severe obstructive airways disease with low FVC.  FVC 3.13/62%, FEV1 2.01/50%, ratio 0.64,  FEF 25-75% 0.87/22% CT sinus 11/23/2009-moderate chronic appearing pansinusitis. Surgery 09/24/2010-polypoid chronic sinusitis CBC with differential 12/28/2015-eosinophils 7.3% CBC w diff 09/28/2018- steroid pattern EOS 0.2 k/ul FENO 10/02/16- 33 H Office spirometry 10/02/16-moderate restriction and obstruction. FVC 2.66/53%, FEV1 2.0/51%, ratio 0.75, FEF 25-75% 1.60/46% IgE 10/02/2016-295 PFT 01/29/2019- Mod obst, mild restrction, slight response to dilator, normal Diffusion --------------------------------------------------------------------------------------   02/16/2019- 53 year old male never smoker followed for chronic obstructive asthma ( Asthma- COPD Overlap Syndrome), OSA/quit CPAP, rhinitis/chronic polypoid sinusitis, -----f/u after Fasenra inj; pt states he's noticed his breathing has been better; reports congestion yesterday, clear mucus Fasenra, Prednisone maint 5 mg, Neb Duoneb, Flonase, Symbicort 160, albuterol hfa Marked improvement on Fasenra. Has gradually tapered prednisone to 5 mg daily, but began stiff knees. We discussed adrenal insuff. 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 Now sleeping through night w/o cough. Notices occ enlarged ant cervical nodes- come and go. PFT 01/29/2019- Mod obst, mild restriction, slight response to dilator, normal Diffusion CXR 12/29/17- Mild bronchitic changes.  07/09/2019- 53 year old male never smoker followed for chronic obstructive asthma ( Asthma- COPD Overlap Syndrome), OSA/quit CPAP, rhinitis/chronic polypoid  sinusitis, -----f/u Asthma-COPD. breathing is at his baseline albut hfa, Symbicort 160, Flonase, neb Duoneb, singulair, pred 5 mg daily, Epipen Saw NP here 12/10 for rash. Note reviewed. Given pred taper.  Macular pruritic rash on R arm. Saw Dermatologist "allergic reaction". Doing better with lotrimin topical and prednisone taper.  Glaucoma pressure up. Opth asks him to try to reduce steroids.  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  Updated flu and tetanus vax. CXR 02/16/2019- There is no appreciable edema or consolidation. The heart size and pulmonary vascularity are normal. No adenopathy. No bone lesions. IMPRESSION: No edema or consolidation.  Review of Systems-See HPI  + = positive Constitutional:    No- night sweats ,fevers, chills, fatigue, lassitude. HEENT:   No - headaches,  Difficulty swallowing, Tooth/dental problems, Sore throat,                No sneezing, itching, ear ache, CV:  No chest pain,  Orthopnea, PND, swelling in lower extremities, anasarca, dizziness, palpitations GI  No heartburn, indigestion, abdominal pain, nausea, vomiting,  Resp:.See HPI   + productive cough   No coughing up of blood.  change in color of mucus,  + wheeze Skin: no rash or lesions. GU: . MS:  No joint pain or swelling.  [name] Psych:   change in mood or affect.  depression and  anxiety.  No memory loss.   Objective:   Physical Exam General- Alert, Oriented, Affect-appropriate, Distress- none acute, + Obese / thick neck Skin- + birthmark R hand Lymphadenopathy- none Head- atraumatic            Eyes- Gross vision intact, PERRLA, conjunctivae clear secretions            Ears- Hearing, canals normal            Nose- Clear, no-Septal dev, mucus, polyps, erosion, perforation  Throat- Mallampati  III- IV , mucosa+ geographic, drainage- none, tonsils- atrophic. , Neck- flexible , trachea midline, no stridor , thyroid nl, carotid no bruit Chest -  symmetrical excursion , unlabored           Heart/CV- RRR , no murmur , no gallop  , no rub, nl s1 s2                           - JVD- none , edema- none, stasis changes- none, varices- none           Lung-  Wheeze-none, clear, unlabored,   cough -None , dullness-none, rub- none           Chest wall-  Abd-  Br/ Gen/ Rectal- Not done, not indicated Extrem- cyanosis- none, clubbing, none, atrophy- none, strength- nl Neuro- grossly intact to observation

## 2019-07-13 DIAGNOSIS — H40013 Open angle with borderline findings, low risk, bilateral: Secondary | ICD-10-CM | POA: Diagnosis not present

## 2019-07-13 DIAGNOSIS — Z83511 Family history of glaucoma: Secondary | ICD-10-CM | POA: Diagnosis not present

## 2019-07-20 ENCOUNTER — Other Ambulatory Visit: Payer: Self-pay | Admitting: Internal Medicine

## 2019-07-26 ENCOUNTER — Telehealth: Payer: Self-pay | Admitting: Internal Medicine

## 2019-07-26 NOTE — Telephone Encounter (Signed)
Called [facility] to set up shipment of pt's [name]. Was advised that this prescription is still being verified with their insurance department. Will call back later this week to set up shipment.

## 2019-07-27 DIAGNOSIS — L298 Other pruritus: Secondary | ICD-10-CM | POA: Diagnosis not present

## 2019-07-27 DIAGNOSIS — C44319 Basal cell carcinoma of skin of other parts of face: Secondary | ICD-10-CM | POA: Diagnosis not present

## 2019-07-27 DIAGNOSIS — D485 Neoplasm of uncertain behavior of skin: Secondary | ICD-10-CM | POA: Diagnosis not present

## 2019-07-28 NOTE — Telephone Encounter (Signed)
Called [facility] to get a status on the pt's shipment. Was advised that this shipment is still in the "insurance phase" and shipment could not be set up right now. Pt's current PA does not expire until 10/09/19. Will follow up with the pharmacy on Friday.

## 2019-08-03 DIAGNOSIS — J4550 Severe persistent asthma, uncomplicated: Secondary | ICD-10-CM | POA: Diagnosis not present

## 2019-08-03 NOTE — Telephone Encounter (Signed)
Called and spoke with Patient. Patient scheduled 08/05/19 at [facility] for [facility] injection. Nothing further at this time.

## 2019-08-03 NOTE — Telephone Encounter (Signed)
[name] [name] Order: 30mg  #1 prefilled syringe Ordered date: 08/03/19 Expected date of arrival: 08/04/19 Ordered by: [place]: [facility]

## 2019-08-03 NOTE — Telephone Encounter (Signed)
Pt calling to make sure [name] is here for injection for 1/13.  [phone].  Please leave a message if he doesn't answer.

## 2019-08-04 ENCOUNTER — Ambulatory Visit: Payer: BC Managed Care – PPO

## 2019-08-04 NOTE — Telephone Encounter (Signed)
Fasenra Shipment Received:  30mg  #1 prefilled syringe Medication arrival date: 08/04/19 Lot #: [id number] Exp date: 10/20/2020 Received by: Elliot Dally

## 2019-08-05 ENCOUNTER — Ambulatory Visit (INDEPENDENT_AMBULATORY_CARE_PROVIDER_SITE_OTHER): Payer: BC Managed Care – PPO

## 2019-08-05 ENCOUNTER — Telehealth: Payer: Self-pay | Admitting: Internal Medicine

## 2019-08-05 ENCOUNTER — Other Ambulatory Visit: Payer: Self-pay

## 2019-08-05 DIAGNOSIS — J4550 Severe persistent asthma, uncomplicated: Secondary | ICD-10-CM

## 2019-08-05 MED ORDER — BENRALIZUMAB 30 MG/ML ~~LOC~~ SOSY
30.0000 mg | PREFILLED_SYRINGE | Freq: Once | SUBCUTANEOUS | Status: AC
  Administered 2019-08-05: 09:00:00 30 mg via SUBCUTANEOUS

## 2019-08-05 NOTE — Progress Notes (Signed)
Have you been hospitalized within the last 10 days?  No Do you have a fever?  No Do you have a cough?  No Do you have a headache or sore throat? No Do you have your Epi Pen visible and is it within date?  Yes 

## 2019-08-05 NOTE — Telephone Encounter (Signed)
Patient came to office today for fasenra injection. Patient stated his yearly assistance will expire in March 2021, and he is concerned it may delay next injection scheduled. Patient  signed [name] enrollment and Patient prescription savings application.  Will hold until closer to 09/2019 expiration date to fax to [place]. Will follow up.

## 2019-08-06 ENCOUNTER — Telehealth: Payer: Self-pay | Admitting: Internal Medicine

## 2019-08-06 NOTE — Telephone Encounter (Signed)
Yes- ok to get covid vax today No known reason to recommend one arm over the other. I think I would get it in the other arm since getting Fasenra today.

## 2019-08-06 NOTE — Telephone Encounter (Signed)
Called and spoke with pt and stated to him the info from [place]. Pt verbalized understanding. Nothing further needed.

## 2019-08-06 NOTE — Telephone Encounter (Signed)
Dr. Annamaria Boots, please advise on pt's message. Thanks!

## 2019-08-18 DIAGNOSIS — L570 Actinic keratosis: Secondary | ICD-10-CM | POA: Diagnosis not present

## 2019-08-18 DIAGNOSIS — Q828 Other specified congenital malformations of skin: Secondary | ICD-10-CM | POA: Diagnosis not present

## 2019-08-18 DIAGNOSIS — L298 Other pruritus: Secondary | ICD-10-CM | POA: Diagnosis not present

## 2019-08-18 DIAGNOSIS — D229 Melanocytic nevi, unspecified: Secondary | ICD-10-CM | POA: Diagnosis not present

## 2019-08-18 DIAGNOSIS — L905 Scar conditions and fibrosis of skin: Secondary | ICD-10-CM | POA: Diagnosis not present

## 2019-08-24 NOTE — Assessment & Plan Note (Signed)
He has been on steroids a long time. Hopefully [name] will permit reduction or d/c of steroids. Discussed adrenal suppression again. Plan- Prednisone 1 mg tabs, to use in tapering to 4 mg x 1 week, 3 mg x 1 week, 2 mg x 1 week, 1 mg x 1 week , then d/c if stable.

## 2019-08-24 NOTE — Assessment & Plan Note (Signed)
Never tolerated CPAP and has not chosen to go with alternatives of oral appliance or ENT. Focusing on weight loss and sleep off back, safe driving

## 2019-08-30 DIAGNOSIS — Z0182 Encounter for allergy testing: Secondary | ICD-10-CM | POA: Diagnosis not present

## 2019-08-30 DIAGNOSIS — L233 Allergic contact dermatitis due to drugs in contact with skin: Secondary | ICD-10-CM | POA: Diagnosis not present

## 2019-08-31 DIAGNOSIS — C44311 Basal cell carcinoma of skin of nose: Secondary | ICD-10-CM | POA: Diagnosis not present

## 2019-09-02 DIAGNOSIS — L989 Disorder of the skin and subcutaneous tissue, unspecified: Secondary | ICD-10-CM | POA: Diagnosis not present

## 2019-09-02 DIAGNOSIS — L235 Allergic contact dermatitis due to other chemical products: Secondary | ICD-10-CM | POA: Diagnosis not present

## 2019-09-02 DIAGNOSIS — D485 Neoplasm of uncertain behavior of skin: Secondary | ICD-10-CM | POA: Diagnosis not present

## 2019-09-02 DIAGNOSIS — Z0182 Encounter for allergy testing: Secondary | ICD-10-CM | POA: Diagnosis not present

## 2019-09-07 DIAGNOSIS — L298 Other pruritus: Secondary | ICD-10-CM | POA: Diagnosis not present

## 2019-09-07 DIAGNOSIS — Q828 Other specified congenital malformations of skin: Secondary | ICD-10-CM | POA: Diagnosis not present

## 2019-09-14 ENCOUNTER — Telehealth: Payer: Self-pay | Admitting: Internal Medicine

## 2019-09-14 NOTE — Telephone Encounter (Signed)
We no longer do allergy skin testing here, since we closed the allergy vaccine program in 2017.   We can do blood panel testing for common allergies, but it is very expensive. I suggest he call [facility] on [address] across from [place]. They are good allergists and could evaluate his problem more completely than I can.now.

## 2019-09-14 NOTE — Telephone Encounter (Signed)
Called pt and made him aware of his options. He verbalized his understanding and stated he will contact the allergists. Nothing further needed.

## 2019-09-14 NOTE — Telephone Encounter (Signed)
Pt has went to the dermatologist for itching and he was advised to take allegra and benadryl until a solution could be made. His dermatologists referred him back to pulmonary and pt wants to know if you would be willing to do an allergy test asap. Please advise.

## 2019-09-20 ENCOUNTER — Telehealth: Payer: Self-pay | Admitting: Internal Medicine

## 2019-09-20 NOTE — Telephone Encounter (Signed)
Called [facility] to set up shipment of [name] [name]. Was told that it's too early to set up shipment. Will need to call back later this week to set up.

## 2019-09-20 NOTE — Telephone Encounter (Signed)
Forms have been faxed to [employer] and [facility]. Will continue to follow up.

## 2019-09-21 ENCOUNTER — Ambulatory Visit (INDEPENDENT_AMBULATORY_CARE_PROVIDER_SITE_OTHER): Payer: BC Managed Care – PPO | Admitting: Allergy and Immunology

## 2019-09-21 ENCOUNTER — Other Ambulatory Visit: Payer: Self-pay

## 2019-09-21 ENCOUNTER — Encounter: Payer: Self-pay | Admitting: Allergy and Immunology

## 2019-09-21 VITALS — BP 144/70 | HR 96 | Temp 97.8°F | Resp 16 | Ht 70.0 in | Wt 289.8 lb

## 2019-09-21 DIAGNOSIS — T7840XD Allergy, unspecified, subsequent encounter: Secondary | ICD-10-CM

## 2019-09-21 DIAGNOSIS — Z8709 Personal history of other diseases of the respiratory system: Secondary | ICD-10-CM | POA: Diagnosis not present

## 2019-09-21 DIAGNOSIS — J3089 Other allergic rhinitis: Secondary | ICD-10-CM | POA: Diagnosis not present

## 2019-09-21 DIAGNOSIS — J449 Chronic obstructive pulmonary disease, unspecified: Secondary | ICD-10-CM

## 2019-09-21 DIAGNOSIS — K2970 Gastritis, unspecified, without bleeding: Secondary | ICD-10-CM | POA: Diagnosis not present

## 2019-09-21 DIAGNOSIS — L500 Allergic urticaria: Secondary | ICD-10-CM | POA: Diagnosis not present

## 2019-09-21 MED ORDER — FAMOTIDINE 20 MG PO TABS: 20.0000 mg | ORAL_TABLET | Freq: Two times a day (BID) | ORAL | 5 refills | Status: DC | PRN

## 2019-09-21 MED ORDER — TRIAMCINOLONE ACETONIDE 0.1 % EX OINT: 1.0000 "application " | TOPICAL_OINTMENT | Freq: Two times a day (BID) | CUTANEOUS | 2 refills | Status: DC | PRN

## 2019-09-21 MED ORDER — LEVOCETIRIZINE DIHYDROCHLORIDE 5 MG PO TABS: 5.0000 mg | ORAL_TABLET | Freq: Every day | ORAL | 5 refills | Status: DC | PRN

## 2019-09-21 NOTE — Patient Instructions (Addendum)
Recurrent urticaria/pruritus Unclear etiology. Skin tests to select food allergens were negative today. NSAIDs and emotional stress commonly exacerbate urticaria but are not the underlying etiology in this case. Physical urticarias are negative by history (i.e. pressure-induced, temperature, vibration, solar, etc.). History and lesions are not consistent with urticaria pigmentosa so I am not suspicious for mastocytosis. There are no concomitant symptoms concerning for anaphylaxis or constitutional symptoms worrisome for an underlying malignancy. We will rule out other potential etiologies with labs. For symptom relief, patient is to take oral antihistamines as directed.  The following labs have been ordered: FCeRI antibody, anti-thyroglobulin antibody, thyroid peroxidase antibody, tryptase, H. pylori urea breath test, CBC, CMP, ESR, ANA, and alpha-gal panel.  The patient will be called with further recommendations after lab results have returned.  Instructions have been discussed and provided for H1/H2 receptor blockade with titration to find lowest effective dose.  A prescription has been provided for levocetirizine (Xyzal), 5 mg daily as needed.  A prescription has been provided for famotidine (Pepcid), 34m twice daily as needed.  A prescription has been provided for triamcinolone 0.1% ointment sparingly to affected areas twice daily as needed.  This medication is not to be used on the face, neck, axillae, or groin.  Should there be a significant increase or change in symptoms, a journal is to be kept recording any foods eaten, beverages consumed, medications taken within a 6 hour period prior to the onset of symptoms, as well as record activities being performed, and environmental conditions. For any symptoms concerning for anaphylaxis, 911 is to be called immediately.  Perennial allergic rhinitis  Aeroallergen avoidance measures have been discussed and provided in written  form.  Levocetirizine has been prescribed (as above).  To avoid diminishing benefit with daily use (tachyphylaxis) of second generation antihistamine, consider alternating every few months between fexofenadine (Allegra) and levocetirizine (Xyzal).  Continue fluticasone nasal spray as directed.  Nasal saline spray (i.e., Simply Saline) or nasal saline lavage (i.e., NeilMed) is recommended as needed and prior to medicated nasal sprays.  History of nasal polyposis  Continue fluticasone nasal spray daily as directed.  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  Follow-up with Dr. YAnnamaria Bootsas recommended.   When lab results have returned you will be called with further recommendations. With the newly implemented Cures Act, the labs may be visible to you at the same time they become visible to [place] However, the results will typically not be addressed until all of the results are back, so please be patient.  Until you have heard from [place] please continue the treatment plan as outlined on your take home sheet.  Urticaria (Hives)  . Levocetirizine (Xyzal) 5 mg twice a day and famotidine (Pepcid) 20 mg twice a day. If no symptoms for 7-14 days then decrease to. . Levocetirizine (Xyzal) 5 mg twice a day and famotidine (Pepcid) 20 mg once a day.  If no symptoms for 7-14 days then decrease to. . Levocetirizine (Xyzal) 5 mg twice a day.  If no symptoms for 7-14 days then decrease to. . Levocetirizine (Xyzal) 5 mg once a day.  May use Benadryl (diphenhydramine) as needed for breakthrough symptoms       If symptoms return, then step up dosage  Control of HSpeersdust mites play a major role in allergic asthma and  rhinitis.  They occur in environments with high humidity wherever human  skin, the food for dust mites is found. High levels have been detected in dust obtained from mattresses, pillows, carpets, upholstered furniture, bed covers, clothes and soft toys.  The principal allergen of the house dust mite is found in its feces.  A gram of dust may contain 1,000 mites and 250,000 fecal particles.  Mite antigen is easily measured in the air during house cleaning activities.    1. Encase mattresses, including the box spring, and pillow, in an air tight cover.  Seal the zipper end of the encased mattresses with wide adhesive tape. 2. Wash the bedding in water of 130 degrees Farenheit weekly.  Avoid cotton comforters/quilts and flannel bedding: the most ideal bed covering is the dacron comforter. 3. Remove all upholstered furniture from the bedroom. 4. Remove carpets, carpet padding, rugs, and non-washable window drapes from the bedroom.  Wash drapes weekly or use plastic window coverings. 5. Remove all non-washable stuffed toys from the bedroom.  Wash stuffed toys weekly. 6. Have the room cleaned frequently with a vacuum cleaner and a damp dust-mop.  The patient should not be in a room which is being cleaned and should wait 1 hour after cleaning before going into the room. 7. Close and seal all heating outlets in the bedroom.  Otherwise, the room will become filled with dust-laden air.  An electric heater can be used to heat the room. 8. Reduce indoor humidity to less than 50%.  Do not use a humidifier.

## 2019-09-21 NOTE — Progress Notes (Signed)
New Patient Note  RE: [name] [name] MRN: [id number] DOB: 13-Feb-1966 Date of Office Visit: 09/21/2019  Referring provider: No ref. provider found Primary care provider: Marrian Salvage, FNP  Chief Complaint: Urticaria and Pruritus   History of present illness: [name] [name] is a 54 y.o. male presenting today for evaluation of urticaria/allergic reaction.  Since late November, [name] [name] has experienced recurrent episodes of pruritus/hives.  He has experienced pruritus and erythematous patches on his upper extremities, lower extremities, and back.  Raised lesions have appeared primarily on his right upper extremity.  The lesions are described as erythematous, raised, and pruritic on the right upper extremity and erythematous and pruritic on the hands, arms, axillae, back, and legs.  He has been evaluated by a dermatologist with negative patch testing and a lesion biopsy which was determined to be urticaria.  He denies concomitant angioedema, cardiopulmonary symptoms and GI symptoms. He has not experienced unexpected weight loss, recurrent fevers or drenching night sweats.  He had a tetanus vaccine and flu vaccine approximately 2 weeks prior to onset of symptoms.  Otherwise, no specific medication, food, skin care product, detergent, soap, or other environmental triggers have been identified. The symptoms do not seem to correlate with NSAIDs use.  He notes that he has been under a great deal of emotional stress. He did not have symptoms consistent with a respiratory tract infection at the time of symptom onset. [name] [name] has tried to control symptoms with OTC antihistamines and topical steroids which have offered moderate temporary relief. He has not been evaluated and treated in the emergency department for these symptoms. Skin biopsy has been performed. Recently, he has been experiencing hives daily.  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  However he does not believe that the pruritus/urticaria  is related to formaldehyde exposure because over the past few months his exposure to formaldehyde has decreased significantly He has a history of allergic rhinosinusitis and nasal polyposis.  He had sinus surgery/polypectomy approximately 8 years ago.  He has been on immunotherapy injections in the past for pollen, danders, and dust mite.  He takes fluticasone nasal spray daily because of the history of nasal polyps, however he has decreased the dose to 1 spray per nostril daily because he was told that he has some intraocular pressure, right greater than left, however he does not have a diagnosis of glaucoma. He is on [name] [name] for asthma/COPD.  He reports that [name] [name] has provided symptom relief "like a God-send."  In addition to the [name] [name] he takes Symbicort 160-4.5 g, 2 inhalations daily, and montelukast 10 mg daily at bedtime.  He is weaning off of long-term prednisone use.  Assessment and plan: Recurrent urticaria/pruritus Unclear etiology. Skin tests to select food allergens were negative today. NSAIDs and emotional stress commonly exacerbate urticaria but are not the underlying etiology in this case. Physical urticarias are negative by history (i.e. pressure-induced, temperature, vibration, solar, etc.). History and lesions are not consistent with urticaria pigmentosa so I am not suspicious for mastocytosis. There are no concomitant symptoms concerning for anaphylaxis or constitutional symptoms worrisome for an underlying malignancy. We will rule out other potential etiologies with labs. For symptom relief, patient is to take oral antihistamines as directed.  The following labs have been ordered: FCeRI antibody, anti-thyroglobulin antibody, thyroid peroxidase antibody, tryptase, H. pylori urea breath test, CBC, CMP, ESR, ANA, and alpha-gal panel.  The patient will be called with further recommendations after lab results have returned.  Instructions have been discussed and provided  for H1/H2  receptor blockade with titration to find lowest effective dose.  A prescription has been provided for levocetirizine (Xyzal), 5 mg daily as needed.  A prescription has been provided for famotidine (Pepcid), 53m twice daily as needed.  A prescription has been provided for triamcinolone 0.1% ointment sparingly to affected areas twice daily as needed.  This medication is not to be used on the face, neck, axillae, or groin.  Should there be a significant increase or change in symptoms, a journal is to be kept recording any foods eaten, beverages consumed, medications taken within a 6 hour period prior to the onset of symptoms, as well as record activities being performed, and environmental conditions. For any symptoms concerning for anaphylaxis, 911 is to be called immediately.  Seasonal and perennial allergic rhinitis  Aeroallergen avoidance measures have been discussed and provided in written form.  Levocetirizine has been prescribed (as above).  To avoid diminishing benefit with daily use (tachyphylaxis) of second generation antihistamine, consider alternating every few months between fexofenadine (Allegra) and levocetirizine (Xyzal).  Continue fluticasone nasal spray as directed.  Nasal saline spray (i.e., Simply Saline) or nasal saline lavage (i.e., NeilMed) is recommended as needed and prior to medicated nasal sprays.  History of nasal polyposis  Continue fluticasone nasal spray daily as directed.  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  Follow-up with Dr. YAnnamaria Bootsas recommended.   Meds ordered this encounter  Medications  . levocetirizine (XYZAL) 5 MG tablet    Sig: Take 1 tablet (5 mg total) by mouth daily as needed for allergies.    Dispense:  30 tablet    Refill:  5  . famotidine  (PEPCID) 20 MG tablet    Sig: Take 1 tablet (20 mg total) by mouth 2 (two) times daily as needed for heartburn or indigestion.    Dispense:  60 tablet    Refill:  5  . triamcinolone ointment (KENALOG) 0.1 %    Sig: Apply 1 application topically 2 (two) times daily as needed.    Dispense:  453.6 g    Refill:  2    Diagnostics: Spirometry: FVC was 3.24 L (65% predicted) and FEV1 was 2.49 L (65% predicted) with an FEV1 ratio of 100%.  Moderate restriction.  Please see scanned spirometry results for details. Environmental skin testing: Positive to dust mite antigen Food allergen skin testing: Negative despite a positive histamine control.    Physical examination: Blood pressure (!) 144/70, pulse 96, temperature 97.8 F (36.6 C), temperature source Temporal, resp. rate 16, height 5' 10"  (1.778 m), weight 289 lb 12.8 oz (131.5 kg), SpO2 96 %.  General: Alert, interactive, in no acute distress. HEENT: TMs pearly gray, turbinates mildly edematous without discharge, post-pharynx mildly erythematous. Neck: Supple without lymphadenopathy. Lungs: Clear to auscultation without wheezing, rhonchi or rales. CV: Normal S1, S2 without murmurs. Abdomen: Nondistended, nontender. Skin: Erythematous patches on the dorsal aspect of the right forearm. Extremities:  No clubbing, cyanosis or edema. Neuro:   Grossly intact.  Review of systems:  Review of systems negative except as noted in HPI / PMHx or noted below: Review of Systems  Constitutional: Negative.   HENT: Negative.   Eyes: Negative.   Respiratory: Negative.   Cardiovascular: Negative.   Gastrointestinal: Negative.   Genitourinary: Negative.   Musculoskeletal: Negative.   Skin: Negative.  Neurological: Negative.   Endo/Heme/Allergies: Negative.   Psychiatric/Behavioral: Negative.     Past medical history:  Past Medical History:  Diagnosis Date  . Allergic rhinitis   . Angio-edema   . Complication of anesthesia    difficulty  waking up from lithotripsy and nasal surgery  . History of chronic bronchitis   . History of kidney stones   . Left ureteral stone   . Mild obstructive sleep apnea    per study 02-09-2015  recommended oral appliance / cpap-- pt tried intolerant  . Moderate persistent asthma    pulmologist-  dr young  . Urticaria     Past surgical history:  Past Surgical History:  Procedure Laterality Date  . CYSTO/  LEFT URETERAL STENT PLACEMENT  03-10-2002  . CYSTOSCOPY W/ URETERAL STENT PLACEMENT Left 08/07/2015   Procedure: CYSTOSCOPY, Left Retrograde Pyelogram, Left Ureteral Stent Placement;  Surgeon: Irine Seal, MD;  Location: [facility];  Service: Urology;  Laterality: Left;  . CYSTOSCOPY/URETEROSCOPY/HOLMIUM LASER/STENT PLACEMENT Left 08/17/2015   Procedure: CYSTOSCOPY / LEFT URETEROSCOPY / HOLMIUM LASER LITHOTRIPSY;  Surgeon: Irine Seal, MD;  Location: [facility];  Service: Urology;  Laterality: Left;  . EXTRACORPOREAL SHOCK WAVE LITHOTRIPSY  2006  . SEPTOPLASTY WITH ETHMOIDECTOMY, AND MAXILLARY ANTROSTOMY  09-24-2010  . SINOSCOPY    . STONE EXTRACTION WITH BASKET Left 08/17/2015   Procedure: STONE EXTRACTION WITH BASKET;  Surgeon: Irine Seal, MD;  Location: [facility];  Service: Urology;  Laterality: Left;  . WISDOM TOOTH EXTRACTION  1994    Family history: Family History  Problem Relation Age of Onset  . Asthma Father   . Other Father        bronchitis  . Allergic rhinitis Father   . Eczema Neg Hx   . Immunodeficiency Neg Hx   . Urticaria Neg Hx     Social history: Social History   Socioeconomic History  . Marital status: Married    Spouse name: Not on file  . Number of children: Not on file  . Years of education: Not on file  . Highest education level: Not on file  Occupational History  . Not on file  Tobacco Use  . Smoking status: Never Smoker  . Smokeless tobacco: Never Used  Substance and Sexual Activity  . Alcohol use: Yes    Comment:  socially  . Drug use: Never  . Sexual activity: Not on file  Other Topics Concern  . Not on file  Social History Narrative  . Not on file   Social Determinants of Health   Financial Resource Strain:   . Difficulty of Paying Living Expenses: Not on file  Food Insecurity:   . Worried About Charity fundraiser in the Last Year: Not on file  . Ran Out of Food in the Last Year: Not on file  Transportation Needs:   . Lack of Transportation (Medical): Not on file  . Lack of Transportation (Non-Medical): Not on file  Physical Activity:   . Days of Exercise per Week: Not on file  . Minutes of Exercise per Session: Not on file  Stress:   . Feeling of Stress : Not on file  Social Connections:   . Frequency of Communication with Friends and Family: Not on file  . Frequency of Social Gatherings with Friends and Family: Not on file  . Attends Religious Services: Not on file  . Active Member of Clubs or Organizations: Not on file  . Attends Archivist  Meetings: Not on file  . Marital Status: Not on file  Intimate Partner Violence:   . Fear of Current or Ex-Partner: Not on file  . Emotionally Abused: Not on file  . Physically Abused: Not on file  . Sexually Abused: Not on file    Environmental History: The patient lives in a house built in [place] with carpeting in the bedroom, heat pump, and central air.  There are cats in the home which do not have access to his bedroom.  There is no known mold/water damage in the home.  He is a non-smoker.  Current Outpatient Medications  Medication Sig Dispense Refill  . albuterol (VENTOLIN HFA) 108 (90 Base) MCG/ACT inhaler Inhale 2 puffs into the lungs every 6 (six) hours as needed for wheezing or shortness of breath. 8 g 5  . Benralizumab (FASENRA) 30 MG/ML SOSY Inject 30 mg into the skin every 8 (eight) weeks.    . budesonide-formoterol (SYMBICORT) 160-4.5 MCG/ACT inhaler INHALE 2 PUFFS INTO THE LUNGS TWICE A DAY RINSE MOUTH AFTER USE 30.6  Inhaler 2  . clorazepate (TRANXENE) 7.5 MG tablet Take 1 tablet (7.5 mg total) by mouth 2 (two) times daily as needed. 60 tablet 5  . desoximetasone (TOPICORT) 0.25 % cream Apply 1 application topically 2 (two) times daily.    [name] EPINEPHrine 0.3 mg/0.3 mL IJ SOAJ injection Inject 0.3 mLs (0.3 mg total) into the muscle once for 1 dose. 1 Device 11  . fluticasone (FLONASE) 50 MCG/ACT nasal spray Place 2 sprays into both nostrils daily. 16 g 11  . ibuprofen (ADVIL,MOTRIN) 800 MG tablet Take 800 mg by mouth every 8 (eight) hours as needed.    [name] ipratropium-albuterol (DUONEB) 0.5-2.5 (3) MG/3ML SOLN INHALE 1 VIAL VIA NEBULIZER 4 TIMES A DAY AS NEEDED 1080 mL 3  . montelukast (SINGULAIR) 10 MG tablet 1 daily 90 tablet 1  . Nebulizers (COMPRESSOR NEBULIZER) MISC 1 Device by Does not apply route 4 (four) times daily as needed. 1 each 0  . nystatin (MYCOSTATIN) 100000 UNIT/ML suspension Take 5 mLs (500,000 Units total) by mouth 4 (four) times daily. Swish and swallow 60 mL 2  . predniSONE (DELTASONE) 1 MG tablet 4 tabs daily x 1 week, 3 tabs daily x 1 week, 2 tabs daily x 1 week, then one tab daily 100 tablet 1  . predniSONE (DELTASONE) 10 MG tablet 4 tabs for 2 days, then 3 tabs for 2 days, 2 tabs for 2 days, then 1 tab for 2 days, then 67m 20 tablet 0  . predniSONE (DELTASONE) 5 MG tablet Take 1 tablet (5 mg total) by mouth daily. 90 tablet 1  . Spacer/[name]-Holding Chambers (AEROCHAMBER MV) inhaler Use as instructed 1 each 0  . triamcinolone cream (KENALOG) 0.1 % Apply 1 application topically 2 (two) times daily.    .Marland Kitchentriamcinolone ointment (KENALOG) 0.1 % Apply 1 application topically 2 (two) times daily as needed. 453.6 g 2  . famotidine (PEPCID) 20 MG tablet Take 1 tablet (20 mg total) by mouth 2 (two) times daily as needed for heartburn or indigestion. 60 tablet 5  . levocetirizine (XYZAL) 5 MG tablet Take 1 tablet (5 mg total) by mouth daily as needed for allergies. 30 tablet 5   No current  facility-administered medications for this visit.    Known medication allergies: Allergies  Allergen Reactions  . Fluticasone-Salmeterol Other (See Comments)    REACTION: jaw joint pain    I appreciate the opportunity to take part in Tzion's  care. Please do not hesitate to contact me with questions.  Sincerely,   R. Edgar Frisk, MD

## 2019-09-21 NOTE — Assessment & Plan Note (Addendum)
Unclear etiology. Skin tests to select food allergens were negative today. NSAIDs and emotional stress commonly exacerbate urticaria but are not the underlying etiology in this case. Physical urticarias are negative by history (i.e. pressure-induced, temperature, vibration, solar, etc.). History and lesions are not consistent with urticaria pigmentosa so I am not suspicious for mastocytosis. There are no concomitant symptoms concerning for anaphylaxis or constitutional symptoms worrisome for an underlying malignancy. We will rule out other potential etiologies with labs. For symptom relief, patient is to take oral antihistamines as directed.  The following labs have been ordered: FCeRI antibody, anti-thyroglobulin antibody, thyroid peroxidase antibody, tryptase, H. pylori urea breath test, CBC, CMP, ESR, ANA, and alpha-gal panel.  The patient will be called with further recommendations after lab results have returned.  Instructions have been discussed and provided for H1/H2 receptor blockade with titration to find lowest effective dose.  A prescription has been provided for levocetirizine (Xyzal), 5 mg daily as needed.  A prescription has been provided for famotidine (Pepcid), 31m twice daily as needed.  A prescription has been provided for triamcinolone 0.1% ointment sparingly to affected areas twice daily as needed.  This medication is not to be used on the face, neck, axillae, or groin.  Should there be a significant increase or change in symptoms, a journal is to be kept recording any foods eaten, beverages consumed, medications taken within a 6 hour period prior to the onset of symptoms, as well as record activities being performed, and environmental conditions. For any symptoms concerning for anaphylaxis, 911 is to be called immediately.

## 2019-09-21 NOTE — Assessment & Plan Note (Addendum)
   Continue Fasenra injections as prescribed, Symbicort 160-4.5 g, 2 inhalations via spacer device, daily montelukast 10 mg daily, and DuoNeb every 6 hours if needed.  Follow-up with Dr. Annamaria Boots as recommended.

## 2019-09-21 NOTE — Assessment & Plan Note (Signed)
   Aeroallergen avoidance measures have been discussed and provided in written form.  Levocetirizine has been prescribed (as above).  To avoid diminishing benefit with daily use (tachyphylaxis) of second generation antihistamine, consider alternating every few months between fexofenadine (Allegra) and levocetirizine (Xyzal).  Continue fluticasone nasal spray as directed.  Nasal saline spray (i.e., Simply Saline) or nasal saline lavage (i.e., NeilMed) is recommended as needed and prior to medicated nasal sprays.

## 2019-09-21 NOTE — Assessment & Plan Note (Signed)
   Continue fluticasone nasal spray daily as directed.

## 2019-09-22 ENCOUNTER — Other Ambulatory Visit: Payer: Self-pay | Admitting: Internal Medicine

## 2019-09-22 NOTE — Telephone Encounter (Signed)
Called [facility] to set up shipment of [place]. Was advised that they needed a new prescription on file for the pt. This has been given verbally. [facility] will contact us once the prescription has been run through insurance and is ready to be set up to ship. Will await their return call.

## 2019-09-23 LAB — H. PYLORI BREATH TEST: H pylori Breath Test: NEGATIVE

## 2019-09-23 NOTE — Telephone Encounter (Signed)
Pt called looking for updated information about upcoming injection. Best number [phone]

## 2019-09-24 NOTE — Telephone Encounter (Signed)
Called [facility] to follow up on pt's [place] shipment. Was advised that they are still processing the pt's insurance and the medication is not ready to be set up for shipment. [facility] will contact our office once it's ready. Advised them that the pt has an upcoming appointment on 09/30/19.  Spoke with pt. I have made him aware of this information.

## 2019-09-27 ENCOUNTER — Other Ambulatory Visit: Payer: Self-pay

## 2019-09-27 ENCOUNTER — Telehealth: Payer: Self-pay | Admitting: Internal Medicine

## 2019-09-27 ENCOUNTER — Telehealth: Payer: Self-pay | Admitting: Allergy and Immunology

## 2019-09-27 DIAGNOSIS — J4550 Severe persistent asthma, uncomplicated: Secondary | ICD-10-CM | POA: Diagnosis not present

## 2019-09-27 MED ORDER — MONTELUKAST SODIUM 10 MG PO TABS: ORAL_TABLET | ORAL | 2 refills | Status: DC

## 2019-09-27 NOTE — Telephone Encounter (Signed)
I called and spoke with the patient and he states that he is still having the itching. He is wondering if it has to do with Prednisone withdrawal. He states that he has seen dermatology and allergy and everything they have tried has not worked. He states that you should be able to read the notes from dermatology, but you can call Dr. Verlin Fester to speak with him if you need to. He states that every morning at around 4am he awakes with severe itching on his arms, chest, torso, buttocks, groin and thighs. Please advise.

## 2019-09-27 NOTE — Telephone Encounter (Signed)
Patient called and said that the medication you put him on for his itching is not working. Also he is tapering his self off of predisone and what to know if that would cause him to itch more. [facility] [address] [phone].

## 2019-09-27 NOTE — Telephone Encounter (Signed)
Attempted call patient. No answer. Left message.

## 2019-09-27 NOTE — Telephone Encounter (Signed)
Is a patient taking levocetirizine 5 mg twice a day and famotidine 20 mg twice a day? If so, add montelukast 10 mg daily at bedtime.  Let the patient know that this medication has, on very rare occasions, because people to feel depressed. Please asked the patient if he has had his labs drawn yet. Thanks.

## 2019-09-27 NOTE — Telephone Encounter (Signed)
Spoke with patient. Patient made aware of Dr. Mariane Masters recommendation. He verbalized understanding.

## 2019-09-27 NOTE — Telephone Encounter (Signed)
Yes, decreasing the prednisone may increase the possibility that the urticaria/pruritus will be active. Thanks.

## 2019-09-27 NOTE — Telephone Encounter (Signed)
Montelukast 10mg  sent to pharmacy per Dr. Verlin Fester. Patient made aware. Patient also wanted feedback on whether you felt that tapering himself of the prednisone may be the cause of his itching.

## 2019-09-27 NOTE — Telephone Encounter (Signed)
I called and spoke with the patient and made him aware of Dr. Janee Morn response. He verbalized understanding. Nothing further is needed.

## 2019-09-27 NOTE — Telephone Encounter (Signed)
Patient called back to make you aware that he spoke with Dr. Annamaria Boots the pulmonologist and that he advised that he could have vasculitis and that the prednisone could have been masking the symptoms. He also wanted to make you aware that he is already taking montelukast every morning. Do you have any further instructions?

## 2019-09-27 NOTE — Telephone Encounter (Signed)
Prednisone may be masking a process like a vasculitis ("[place]urg-Strauss" is one such pattern). When the prednisone dose falls below some threshold, the process activates and symptoms develop. You allergy doctor knows about this and is working for you.  Although this is not likely the problem, make sure you wife isn't washing your sheets with a fabric softener. Some of these, like "Bounce" , and some laundry detergents, have bacterial enzymes that people can become allergic to. I mention this because you wake itching after a night in bed. Warmth under the covers and hot showers can also trigger itching.

## 2019-09-27 NOTE — Telephone Encounter (Signed)
Opal Sidles took a message from [facility] that the pt's medication should arrive tomorrow, 09/28/2019.

## 2019-09-27 NOTE — Telephone Encounter (Signed)
He may take the levocetirizine 3 tmes per day - but warn about chance of somnolence with higher doses.  If he does experience somnolence, may switch to fexofenadine (Allegra) 180 mg 2 or 3 times daily along with the Pepcid as already prescribed. Thanks.

## 2019-09-28 ENCOUNTER — Other Ambulatory Visit: Payer: Self-pay | Admitting: *Deleted

## 2019-09-28 MED ORDER — LEVOCETIRIZINE DIHYDROCHLORIDE 5 MG PO TABS: ORAL_TABLET | ORAL | 5 refills | Status: DC

## 2019-09-28 NOTE — Telephone Encounter (Signed)
We have not received his lab results yet.

## 2019-09-28 NOTE — Telephone Encounter (Signed)
Are you wanting the patient to get a biopsy for vasculitic urticaria?

## 2019-09-28 NOTE — Telephone Encounter (Signed)
Called patient and advised that we will be in touch about his lab results. Patient verbalized understanding.

## 2019-09-28 NOTE — Telephone Encounter (Signed)
The labs we sent screen for vasculitis, but a skin biopsy is the best means of assessing for vasculitic urticaria. Thanks.

## 2019-09-28 NOTE — Telephone Encounter (Signed)
Fasenra Shipment Received:  30mg #1 prefilled syringe Medication arrival date: 09/28/2019 Lot #: [id number] Exp date: 10/2020 Received by: Piper Hassebrock, CMA   

## 2019-09-28 NOTE — Telephone Encounter (Signed)
Called patient and advised. Patient verbalized understanding but was stating that Dr. Annamaria Boots stated that he may have Vasculitis that has been suppressed from the long term Prednisone use. He is wondering if you recommend blood work to check for vasculitis. Please advise.

## 2019-09-29 LAB — COMPREHENSIVE METABOLIC PANEL
ALT: 36 IU/L (ref 0–44)
AST: 25 IU/L (ref 0–40)
Albumin/Globulin Ratio: 1.7 (ref 1.2–2.2)
Albumin: 4.5 g/dL (ref 3.8–4.9)
Alkaline Phosphatase: 94 IU/L (ref 39–117)
BUN/Creatinine Ratio: 20 (ref 9–20)
BUN: 24 mg/dL (ref 6–24)
Bilirubin Total: <0.2 mg/dL (ref 0.0–1.2)
CO2: 27 mmol/L (ref 20–29)
Calcium: 9.8 mg/dL (ref 8.7–10.2)
Chloride: 104 mmol/L (ref 96–106)
Creatinine, Ser: 1.22 mg/dL (ref 0.76–1.27)
GFR calc Af Amer: 78 mL/min/{1.73_m2} (ref >59)
GFR calc non Af Amer: 67 mL/min/{1.73_m2} (ref >59)
Globulin, Total: 2.7 g/dL (ref 1.5–4.5)
Glucose: 99 mg/dL (ref 65–99)
Potassium: 4.7 mmol/L (ref 3.5–5.2)
Sodium: 145 mmol/L — ABNORMAL HIGH (ref 134–144)
Total Protein: 7.2 g/dL (ref 6.0–8.5)

## 2019-09-29 LAB — ALPHA-GAL PANEL
Alpha Gal IgE*: <0.1 kU/L (ref <0.10)
Beef (Bos spp) IgE: <0.1 kU/L (ref <0.35)
Class Interpretation: 0
Class Interpretation: 0
Class Interpretation: 0
Lamb/Mutton (Ovis spp) IgE: <0.1 kU/L (ref <0.35)
Pork (Sus spp) IgE: <0.1 kU/L (ref <0.35)

## 2019-09-29 LAB — SEDIMENTATION RATE: Sed Rate: 44 mm/hr — ABNORMAL HIGH (ref 0–30)

## 2019-09-29 LAB — CBC
Hematocrit: 48.3 % (ref 37.5–51.0)
Hemoglobin: 16 g/dL (ref 13.0–17.7)
MCH: 29.5 pg (ref 26.6–33.0)
MCHC: 33.1 g/dL (ref 31.5–35.7)
MCV: 89 fL (ref 79–97)
Platelets: 257 10*3/uL (ref 150–450)
RBC: 5.42 x10E6/uL (ref 4.14–5.80)
RDW: 13.7 % (ref 11.6–15.4)
WBC: 11.5 10*3/uL — ABNORMAL HIGH (ref 3.4–10.8)

## 2019-09-29 LAB — ANA: ANA Titer 1: NEGATIVE

## 2019-09-29 LAB — CHRONIC URTICARIA: cu index: <1.5 (ref <10)

## 2019-09-29 LAB — THYROGLOBULIN ANTIBODY: Thyroglobulin Antibody: 1.5 IU/mL — ABNORMAL HIGH (ref 0.0–0.9)

## 2019-09-29 LAB — THYROID PEROXIDASE ANTIBODY: Thyroperoxidase Ab SerPl-aCnc: <9 IU/mL (ref 0–34)

## 2019-09-29 LAB — TRYPTASE: Tryptase: 5.4 ug/L (ref 2.2–13.2)

## 2019-09-30 ENCOUNTER — Ambulatory Visit (INDEPENDENT_AMBULATORY_CARE_PROVIDER_SITE_OTHER): Payer: BC Managed Care – PPO

## 2019-09-30 ENCOUNTER — Other Ambulatory Visit: Payer: Self-pay | Admitting: Family

## 2019-09-30 ENCOUNTER — Other Ambulatory Visit: Payer: Self-pay

## 2019-09-30 DIAGNOSIS — J4550 Severe persistent asthma, uncomplicated: Secondary | ICD-10-CM

## 2019-09-30 DIAGNOSIS — R899 Unspecified abnormal finding in specimens from other organs, systems and tissues: Secondary | ICD-10-CM

## 2019-09-30 MED ORDER — BENRALIZUMAB 30 MG/ML ~~LOC~~ SOSY
30.0000 mg | PREFILLED_SYRINGE | Freq: Once | SUBCUTANEOUS | Status: AC
  Administered 2019-09-30: 30 mg via SUBCUTANEOUS

## 2019-09-30 NOTE — Telephone Encounter (Signed)
I spoke with the patient on the telephone.  He has had a biopsy per [facility] dermatology and he was told by the dermatologist that he had "hives" and nothing was mentioned about vasculitic urticaria.  He is currently on Fasenra per his pulmonologist, Dr. Annamaria Boots, who he will be seeing tomorrow.  I suggested that he speak with Dr. Annamaria Boots about possibly switching from [name] to [place].  However, he has been on low-dose prednisone for quite a while which may lower his laboratory IgE levels.

## 2019-09-30 NOTE — Telephone Encounter (Signed)
Patient made aware of lab results per Dr. Verlin Fester. Patient verbalized that he is interested in [place], but that he would like to speak with Dr. Verlin Fester to discuss his concerns further. He verbalized that he is currently on Fasenra that was prescribed by Pulmonology.

## 2019-09-30 NOTE — Progress Notes (Signed)
All questions were answered by the patient before medication was administered. Have you been hospitalized in the last 10 days? No Do you have a fever? No Do you have a cough? No Do you have a headache or sore throat? No  

## 2019-09-30 NOTE — Telephone Encounter (Signed)
Responded to patients message and he stated he has spoken to someone about his test results an hour ago. Waiting for documentation.

## 2019-09-30 NOTE — Telephone Encounter (Signed)
Informed pf os lab results "Pt is on fasenra for asthma and chronic bronchitits, he stated he has seen a dermatologist and had a biopsy. I will look for those results as he stated we had them. Hives are just itchy not bruising no pain. "  Also talked to kayla to see about medical release to send it back to dermaotlogy to get biopsy results she stated she would try and look for it, when she had time. I have not seen it scanned in.

## 2019-10-01 ENCOUNTER — Ambulatory Visit (INDEPENDENT_AMBULATORY_CARE_PROVIDER_SITE_OTHER): Payer: BC Managed Care – PPO | Admitting: Internal Medicine

## 2019-10-01 ENCOUNTER — Other Ambulatory Visit: Payer: Self-pay

## 2019-10-01 ENCOUNTER — Ambulatory Visit (INDEPENDENT_AMBULATORY_CARE_PROVIDER_SITE_OTHER): Payer: BC Managed Care – PPO

## 2019-10-01 ENCOUNTER — Other Ambulatory Visit: Payer: Self-pay | Admitting: General Surgery

## 2019-10-01 ENCOUNTER — Encounter: Payer: Self-pay | Admitting: Internal Medicine

## 2019-10-01 ENCOUNTER — Telehealth: Payer: Self-pay | Admitting: Internal Medicine

## 2019-10-01 ENCOUNTER — Other Ambulatory Visit (INDEPENDENT_AMBULATORY_CARE_PROVIDER_SITE_OTHER): Payer: BC Managed Care – PPO

## 2019-10-01 VITALS — BP 154/82 | HR 88 | Temp 97.0°F | Ht 70.0 in | Wt 285.0 lb

## 2019-10-01 DIAGNOSIS — R899 Unspecified abnormal finding in specimens from other organs, systems and tissues: Secondary | ICD-10-CM | POA: Diagnosis not present

## 2019-10-01 DIAGNOSIS — J449 Chronic obstructive pulmonary disease, unspecified: Secondary | ICD-10-CM

## 2019-10-01 DIAGNOSIS — L500 Allergic urticaria: Secondary | ICD-10-CM | POA: Diagnosis not present

## 2019-10-01 DIAGNOSIS — N179 Acute kidney failure, unspecified: Secondary | ICD-10-CM | POA: Diagnosis not present

## 2019-10-01 DIAGNOSIS — J9811 Atelectasis: Secondary | ICD-10-CM | POA: Diagnosis not present

## 2019-10-01 DIAGNOSIS — J45909 Unspecified asthma, uncomplicated: Secondary | ICD-10-CM | POA: Diagnosis not present

## 2019-10-01 LAB — CBC WITH DIFFERENTIAL/PLATELET
Basophils Absolute: 0 10*3/uL (ref 0.0–0.1)
Basophils Relative: 0.1 % (ref 0.0–3.0)
Eosinophils Absolute: 0 10*3/uL (ref 0.0–0.7)
Eosinophils Relative: 0 % (ref 0.0–5.0)
HCT: 46.7 % (ref 39.0–52.0)
Hemoglobin: 15.5 g/dL (ref 13.0–17.0)
Lymphocytes Relative: 7.5 % — ABNORMAL LOW (ref 12.0–46.0)
Lymphs Abs: 0.8 10*3/uL (ref 0.7–4.0)
MCHC: 33.2 g/dL (ref 30.0–36.0)
MCV: 89.7 fl (ref 78.0–100.0)
Monocytes Absolute: 0.9 10*3/uL (ref 0.1–1.0)
Monocytes Relative: 8.2 % (ref 3.0–12.0)
Neutro Abs: 9 10*3/uL — ABNORMAL HIGH (ref 1.4–7.7)
Neutrophils Relative %: 84.2 % — ABNORMAL HIGH (ref 43.0–77.0)
Platelets: 227 10*3/uL (ref 150.0–400.0)
RBC: 5.21 Mil/uL (ref 4.22–5.81)
RDW: 14.8 % (ref 11.5–15.5)
WBC: 10.7 10*3/uL — ABNORMAL HIGH (ref 4.0–10.5)

## 2019-10-01 LAB — TROPONIN I (HIGH SENSITIVITY): High Sens Troponin I: 4 ng/L (ref 2–17)

## 2019-10-01 LAB — URINALYSIS, ROUTINE W REFLEX MICROSCOPIC
Bilirubin Urine: NEGATIVE
Ketones, ur: NEGATIVE
Leukocytes,Ua: NEGATIVE
Nitrite: NEGATIVE
Specific Gravity, Urine: 1.025 (ref 1.000–1.030)
Total Protein, Urine: NEGATIVE
Urine Glucose: NEGATIVE
Urobilinogen, UA: 0.2 (ref 0.0–1.0)
pH: 7 (ref 5.0–8.0)

## 2019-10-01 LAB — COMPREHENSIVE METABOLIC PANEL
ALT: 32 U/L (ref 0–53)
AST: 21 U/L (ref 0–37)
Albumin: 4.1 g/dL (ref 3.5–5.2)
Alkaline Phosphatase: 67 U/L (ref 39–117)
BUN: 20 mg/dL (ref 6–23)
CO2: 31 mEq/L (ref 19–32)
Calcium: 9.5 mg/dL (ref 8.4–10.5)
Chloride: 102 mEq/L (ref 96–112)
Creatinine, Ser: 1.18 mg/dL (ref 0.40–1.50)
GFR: 64.43 mL/min (ref >60.00)
Glucose, Bld: 112 mg/dL — ABNORMAL HIGH (ref 70–99)
Potassium: 4.1 mEq/L (ref 3.5–5.1)
Sodium: 140 mEq/L (ref 135–145)
Total Bilirubin: 0.4 mg/dL (ref 0.2–1.2)
Total Protein: 7.4 g/dL (ref 6.0–8.3)

## 2019-10-01 LAB — TSH: TSH: 1.7 u[IU]/mL (ref 0.35–4.50)

## 2019-10-01 LAB — BRAIN NATRIURETIC PEPTIDE: Pro B Natriuretic peptide (BNP): 18 pg/mL (ref 0.0–100.0)

## 2019-10-01 MED ORDER — DOXEPIN HCL 3 MG PO TABS: ORAL_TABLET | ORAL | 1 refills | Status: DC

## 2019-10-01 NOTE — Assessment & Plan Note (Signed)
With pruritic urticaria by bx, question development of a vasculitis like EGPA. Predn dose is low, but he had IM steroid sometime this winter. Plan- labs for CBC wdiff, IgE, CMET, ANCA, Troponin, BNP, and U/A.

## 2019-10-01 NOTE — Progress Notes (Addendum)
Patient ID: [name] [name], male    DOB: 22-Apr-1966, 54 y.o.   MRN: [id number]  HPI male never smoker followed for chronic obstructive asthma, OSA/quit CPAP, rhinitis/chronic sinusitis Unattended Home Sleep Test 02/09/15- mild OSA/ AHI 9.3/ hr, desat to 85%, weight 276 lbs PFT 03/06/2007 PFT 01/29/2011 Office Spirometry 02/03/2014-severe obstructive airways disease with low FVC.  FVC 3.13/62%, FEV1 2.01/50%, ratio 0.64,  FEF 25-75% 0.87/22% CT sinus 11/23/2009-moderate chronic appearing pansinusitis. Surgery 09/24/2010-polypoid chronic sinusitis CBC with differential 12/28/2015-eosinophils 7.3% CBC w diff 09/28/2018- steroid pattern EOS 0.2 k/ul FENO 10/02/16- 33 H Office spirometry 10/02/16-moderate restriction and obstruction. FVC 2.66/53%, FEV1 2.0/51%, ratio 0.75, FEF 25-75% 1.60/46% IgE 10/02/2016-295 PFT 01/29/2019- Mod obst, mild restrction, slight response to dilator, normal Diffusion --------------------------------------------------------------------------------------   07/09/2019- 54 year old male never smoker followed for chronic obstructive asthma ( Asthma- COPD Overlap Syndrome), OSA/quit CPAP, rhinitis/chronic polypoid sinusitis, -----f/u Asthma-COPD. breathing is at his baseline albut hfa, Symbicort 160, Flonase, neb Duoneb, singulair, pred 5 mg daily, Epipen Saw NP here 12/10 for rash. Note reviewed. Given pred taper.  Macular pruritic rash on R arm. Saw Dermatologist "allergic reaction". Doing better with lotrimin topical and prednisone taper.  Glaucoma pressure up. Opth asks him to try to reduce steroids.  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  Updated flu and tetanus vax. CXR 02/16/2019- There is no appreciable edema or consolidation. The heart size and pulmonary vascularity are normal. No adenopathy. No bone lesions. IMPRESSION: No edema or consolidation.  10/01/19- 54 year old male never smoker followed for chronic obstructive asthma       ( Asthma- COPD Overlap Syndrome), OSA/quit CPAP, rhinitis/chronic polypoid sinusitis, Urticaria Itching,  -----f/u Asthma-Chronic obstructive pulmonary disease  Skin bx 09/02/19- " consistent with urticaria" Fasenra,albut hfa, Symbicort 160, Flonase, neb Duoneb, singulair, pred 5 mg daily, Epipen  Says itching began a few days after he had Flu vax, tetanus, Fasenra inj.  Has had Phizer covid vax x 2. Itching is waking him every night around 2-3AM. Had punch bx of skin R forearm dx'd urticaria. His allergist and dermatologist gave topicals, singulair/ xyzal/ pepcid/ benadryl. Not helping.  Virtually no asthma as he continues [place]. Allergist had suggested change to Xolair- discussed. Holding at prednisone 5 mg daily maintenance.  Used rescue hfa once last week, Neb this AM.  He shows me erythematous, excoriated papules on trunk and arms.  --10/05/19- Skin pathologist returned my call- punch biopsy of forearm papule had a few neutrophils, but No eosinophils to suggest an eosinophilic vasculitis like EGPA.  Review of Systems-See HPI  + = positive Constitutional:    No- night sweats ,fevers, chills, fatigue, lassitude. HEENT:   No - headaches,  Difficulty swallowing, Tooth/dental problems, Sore throat,                No sneezing, itching, ear ache, CV:  No chest pain,  Orthopnea, PND, swelling in lower extremities, anasarca, dizziness, palpitations GI  No heartburn, indigestion, abdominal pain, nausea, vomiting,  Resp:.See HPI    productive cough   No coughing up of blood.  change in color of mucus,  + wheeze Skin: no rash or lesions. GU: . MS:  No joint pain or swelling.  [name] Psych:   change in mood or affect.  depression and  anxiety.  No memory loss.   Objective:   Physical Exam General- Alert, Oriented, Affect-appropriate, Distress- none acute, + Obese / thick neck Skin- + birthmark R hand Lymphadenopathy- none Head-  atraumatic            Eyes- Gross vision intact, PERRLA,  conjunctivae clear secretions            Ears- Hearing, canals normal            Nose- Clear, no-Septal dev, mucus, polyps, erosion, perforation             Throat- Mallampati  III- IV , mucosa+ geographic, drainage- none, tonsils- atrophic. , Neck- flexible , trachea midline, no stridor , thyroid nl, carotid no bruit Chest - symmetrical excursion , unlabored           Heart/CV- RRR , no murmur , no gallop  , no rub, nl s1 s2                           - JVD- none , edema- none, stasis changes- none, varices- none           Lung-  Wheeze-none, clear, unlabored,   cough -None , dullness-none, rub- none           Chest wall-  Abd-  Br/ Gen/ Rectal- Not done, not indicated Extrem- cyanosis- none, clubbing, none, atrophy- none, strength- nl Neuro- grossly intact to observation

## 2019-10-01 NOTE — Patient Instructions (Addendum)
Order- Start Xolair- hold for now  When Xolair starts we will stop Fasenra  Order- CXR    Dx Asthma COPD overlap   Order- lab- routine urinalysis, CBC w diff, IgE, Troponin, BNP, ANCA    Script sent for doxepin   1 or 2 tabs before bed  See if this helps itching without making you too sleepy in the daytime.  We will contact the pathologist that did your skin biopsy report for details.

## 2019-10-01 NOTE — Assessment & Plan Note (Signed)
Checking U/A, chemistry for sign of vasculitis

## 2019-10-01 NOTE — Assessment & Plan Note (Addendum)
I have call into the derm pathologist to ask if there were any tissue eosinophils as we look for EGPA.  Plan - try doxepin for antihistaminic effect- discussed

## 2019-10-01 NOTE — Telephone Encounter (Signed)
Opal Sidles with [facility] called earlier with a call report on pt's CXR obtained today.  Impression copied below, full report available in Epic.  CY please advise.  Thanks!   IMPRESSION: Minimal subsegmental atelectasis LEFT base.  Question LEFT nipple shadow; repeat PA chest radiograph with nipple markers recommended to exclude pulmonary nodule.

## 2019-10-04 ENCOUNTER — Ambulatory Visit: Payer: BC Managed Care – PPO

## 2019-10-04 ENCOUNTER — Other Ambulatory Visit: Payer: Self-pay

## 2019-10-04 DIAGNOSIS — R911 Solitary pulmonary nodule: Secondary | ICD-10-CM

## 2019-10-04 DIAGNOSIS — J449 Chronic obstructive pulmonary disease, unspecified: Secondary | ICD-10-CM

## 2019-10-04 LAB — IGE: IgE (Immunoglobulin E), Serum: 365 kU/L — ABNORMAL HIGH (ref <=114)

## 2019-10-04 LAB — ANCA SCREEN W REFLEX TITER: ANCA Screen: NEGATIVE

## 2019-10-05 ENCOUNTER — Other Ambulatory Visit: Payer: Self-pay

## 2019-10-05 ENCOUNTER — Telehealth: Payer: Self-pay | Admitting: Internal Medicine

## 2019-10-05 ENCOUNTER — Other Ambulatory Visit: Payer: Self-pay | Admitting: Internal Medicine

## 2019-10-05 ENCOUNTER — Ambulatory Visit (INDEPENDENT_AMBULATORY_CARE_PROVIDER_SITE_OTHER): Payer: BC Managed Care – PPO

## 2019-10-05 DIAGNOSIS — J449 Chronic obstructive pulmonary disease, unspecified: Secondary | ICD-10-CM | POA: Diagnosis not present

## 2019-10-05 DIAGNOSIS — R911 Solitary pulmonary nodule: Secondary | ICD-10-CM

## 2019-10-05 NOTE — Telephone Encounter (Signed)
There is one more lab test result pending. I want to see the whole pattern before reporting.  The pathologist returned my call about the skin biopsy from the right forearm. It does not show the blood vessel inflammation I was concerned about. The xray today shows there is a lung nodule, and we will order a CT scan to get a better look. Try increasing the dose of doxepin, to take 2 of them at bedtime. Can increase this to 3 at bedtime, if needed.

## 2019-10-05 NOTE — Telephone Encounter (Signed)
Dr. Annamaria Boots please review xray results and advise.

## 2019-10-05 NOTE — Telephone Encounter (Signed)
Patient was seen on 09/21/2018, and unfortunately I was not the one who worked with this patient and am unsure at this time if he filled out and signed a medical release form. However I will look into it and see what I can find. If patient did fill one out it has been sent to the scan center already to be scanned into his chart. Im not sure how long that could take but we can see if the patient can call and give them verbal permission to send it to [place] or we can have him come into the office and fill another one out unless it gets scanned into the chart within the next day or so.

## 2019-10-06 NOTE — Telephone Encounter (Signed)
Patient was made aware per 3.12.21 cxr result note Will sign off

## 2019-10-06 NOTE — Telephone Encounter (Signed)
Ok to try up to maximum of 5 Doxepin.

## 2019-10-06 NOTE — Telephone Encounter (Signed)
This has already been taken care of per 3.16.21 cxr result note and pt is aware  Will sign off

## 2019-10-06 NOTE — Telephone Encounter (Signed)
Dr. Annamaria Boots, This message was sent for you this morning.  Took three  (3)Doxepin 3mg  tablets last night as instructed by Dr. Annamaria Boots. Got maybe 45 minutes more sleep last night/ this morning. Does he want me to continue on three (3) tablets or move up to four (4) per night to see if it helps me to sleep a little longer. Been up since 4AM this morning itching. Taking a Warm shower only way I can get any immediate relief. I did apply Triamcinolone Acetonide Cream USP  0.1% to usual affected areas before going to bed. [name] [name] June 29, 1966  Message routed to Dr. Annamaria Boots.  Allergies  Allergen Reactions  . Fluticasone-Salmeterol Other (See Comments)    REACTION: jaw joint pain   Current Outpatient Medications on File Prior to Visit  Medication Sig Dispense Refill  . albuterol (VENTOLIN HFA) 108 (90 Base) MCG/ACT inhaler Inhale 2 puffs into the lungs every 6 (six) hours as needed for wheezing or shortness of breath. 8 g 5  . Benralizumab (FASENRA) 30 MG/ML SOSY Inject 30 mg into the skin every 8 (eight) weeks.    . budesonide-formoterol (SYMBICORT) 160-4.5 MCG/ACT inhaler INHALE 2 PUFFS INTO THE LUNGS TWICE A DAY RINSE MOUTH AFTER USE 30.6 Inhaler 3  . clorazepate (TRANXENE) 7.5 MG tablet Take 1 tablet (7.5 mg total) by mouth 2 (two) times daily as needed. 60 tablet 5  . desoximetasone (TOPICORT) 0.25 % cream Apply 1 application topically 2 (two) times daily.    . Doxepin HCl 3 MG TABS 1 or 2 tabs before bed, for itching 30 tablet 1  . EPINEPHrine 0.3 mg/0.3 mL IJ SOAJ injection Inject 0.3 mLs (0.3 mg total) into the muscle once for 1 dose. 1 Device 11  . famotidine (PEPCID) 20 MG tablet Take 1 tablet (20 mg total) by mouth 2 (two) times daily as needed for heartburn or indigestion. 60 tablet 5  . fluticasone (FLONASE) 50 MCG/ACT nasal spray Place 2 sprays into both nostrils daily. 16 g 11  . ibuprofen (ADVIL,MOTRIN) 800 MG tablet Take 800 mg by mouth every 8 (eight) hours as needed.    [name]  ipratropium-albuterol (DUONEB) 0.5-2.5 (3) MG/3ML SOLN INHALE 1 VIAL VIA NEBULIZER 4 TIMES A DAY AS NEEDED 1080 mL 3  . levocetirizine (XYZAL) 5 MG tablet 1 tablet 3 times daily. 90 tablet 5  . montelukast (SINGULAIR) 10 MG tablet Take one tablet daily in the evening. 30 tablet 2  . Nebulizers (COMPRESSOR NEBULIZER) MISC 1 Device by Does not apply route 4 (four) times daily as needed. 1 each 0  . nystatin (MYCOSTATIN) 100000 UNIT/ML suspension Take 5 mLs (500,000 Units total) by mouth 4 (four) times daily. Swish and swallow 60 mL 2  . predniSONE (DELTASONE) 1 MG tablet 4 tabs daily x 1 week, 3 tabs daily x 1 week, 2 tabs daily x 1 week, then one tab daily 100 tablet 1  . predniSONE (DELTASONE) 10 MG tablet 4 tabs for 2 days, then 3 tabs for 2 days, 2 tabs for 2 days, then 1 tab for 2 days, then 5mg  20 tablet 0  . predniSONE (DELTASONE) 5 MG tablet Take 1 tablet (5 mg total) by mouth daily. 90 tablet 1  . Spacer/[name]-Holding Chambers (AEROCHAMBER MV) inhaler Use as instructed 1 each 0  . triamcinolone ointment (KENALOG) 0.1 % Apply 1 application topically 2 (two) times daily as needed. 453.6 g 2   No current facility-administered medications on file prior to visit.

## 2019-10-06 NOTE — Telephone Encounter (Signed)
Dr. Annamaria Boots, Patient is requesting another prescription for Doxepin. Last prescription was sent to [facility], [id number], with 1 refill, on 10/01/19.  Patient message below-  Thank you. If I do that I will run out of pills I have on hand Friday or Saturday night.  I only got 30 in the bottle. I have I think 16 pills left if my memory is correct Any way I can get another prescription for more. Before then?  Message routed to Dr Annamaria Boots

## 2019-10-07 NOTE — Telephone Encounter (Signed)
I was unable to locate the previously signed medical record release. Patient signed another one which has been faxed to [facility].

## 2019-10-08 MED ORDER — CLONAZEPAM 1 MG PO TABS: ORAL_TABLET | ORAL | 1 refills | Status: DC

## 2019-10-08 NOTE — Telephone Encounter (Signed)
Dr. Annamaria Boots please advise patient email:  I can't really tell is the Doxepin 3mg  is doing anything to help me sleep except it just makes me feel sleepy before bed. I am still waking up between 2:30 and 4 every morning even after taking five of the Tablets before bed. Still wake up itching [name] [name]

## 2019-10-08 NOTE — Telephone Encounter (Signed)
I suggest we d/c doxepin. Instead try clonazepam 1 mg, taking 1-2 tabs about 30 minutes before bedtime I have sent # 30, ref x 1 to try

## 2019-10-08 NOTE — Addendum Note (Signed)
Addended by: Baird Lyons D on: 10/08/2019 07:58 PM   Modules accepted: Orders

## 2019-10-11 NOTE — Telephone Encounter (Signed)
Dr Annamaria Boots, Patient sent you this message earlier today.  Dr. Annamaria Boots. I have been taking the antihistamines as instructed Plus Benadryl in the evenings/ at night along with Allegra daytime.I have exhausted my supply of Doxepin 3mg  tablets . Desperate for sleep and relief from all this itching I bought a bottle of Eucerin Skin Calming Cream 8oz tube from drugstore Saturday afternoon after work. I came home at 6 p.m. , ate and went to bed at the point of sheer exhaustion. The itching keeps me up until dawn after it awakens me every morning around 2 or 3 a.m.[name] I went to bed knowing that I could get a few hours of sleep before the itching came back. Took meds and went to bed. Around 2AM I awoke itching. I used the Eucerine skin calming cream for the first time and the itching subsided. I went back to bed and slept until 7:30 Sunday morning till alarm clock went off. I used it again this morning around 2 a.m. when the itching came back and was able to go back to bed.Woke up at [place]! Finally got some sleep with this cream 2 days now. Any feedback?    Message routed to Dr. Annamaria Boots to advise  Allergies  Allergen Reactions  . Fluticasone-Salmeterol Other (See Comments)    REACTION: jaw joint pain   Current Outpatient Medications on File Prior to Visit  Medication Sig Dispense Refill  . albuterol (VENTOLIN HFA) 108 (90 Base) MCG/ACT inhaler Inhale 2 puffs into the lungs every 6 (six) hours as needed for wheezing or shortness of breath. 8 g 5  . Benralizumab (FASENRA) 30 MG/ML SOSY Inject 30 mg into the skin every 8 (eight) weeks.    . budesonide-formoterol (SYMBICORT) 160-4.5 MCG/ACT inhaler INHALE 2 PUFFS INTO THE LUNGS TWICE A DAY RINSE MOUTH AFTER USE 30.6 Inhaler 3  . clonazePAM (KLONOPIN) 1 MG tablet 1 or 2 before bed for sleep 30 tablet 1  . clorazepate (TRANXENE) 7.5 MG tablet Take 1 tablet (7.5 mg total) by mouth 2 (two) times daily as needed. 60 tablet 5  . desoximetasone (TOPICORT) 0.25 % cream Apply 1  application topically 2 (two) times daily.    [name] EPINEPHrine 0.3 mg/0.3 mL IJ SOAJ injection Inject 0.3 mLs (0.3 mg total) into the muscle once for 1 dose. 1 Device 11  . famotidine (PEPCID) 20 MG tablet Take 1 tablet (20 mg total) by mouth 2 (two) times daily as needed for heartburn or indigestion. 60 tablet 5  . fluticasone (FLONASE) 50 MCG/ACT nasal spray Place 2 sprays into both nostrils daily. 16 g 11  . ibuprofen (ADVIL,MOTRIN) 800 MG tablet Take 800 mg by mouth every 8 (eight) hours as needed.    [name] ipratropium-albuterol (DUONEB) 0.5-2.5 (3) MG/3ML SOLN INHALE 1 VIAL VIA NEBULIZER 4 TIMES A DAY AS NEEDED 1080 mL 3  . levocetirizine (XYZAL) 5 MG tablet 1 tablet 3 times daily. 90 tablet 5  . montelukast (SINGULAIR) 10 MG tablet Take one tablet daily in the evening. 30 tablet 2  . Nebulizers (COMPRESSOR NEBULIZER) MISC 1 Device by Does not apply route 4 (four) times daily as needed. 1 each 0  . nystatin (MYCOSTATIN) 100000 UNIT/ML suspension Take 5 mLs (500,000 Units total) by mouth 4 (four) times daily. Swish and swallow 60 mL 2  . predniSONE (DELTASONE) 1 MG tablet 4 tabs daily x 1 week, 3 tabs daily x 1 week, 2 tabs daily x 1 week, then one tab daily 100 tablet 1  .  predniSONE (DELTASONE) 10 MG tablet 4 tabs for 2 days, then 3 tabs for 2 days, 2 tabs for 2 days, then 1 tab for 2 days, then 5mg  20 tablet 0  . predniSONE (DELTASONE) 5 MG tablet Take 1 tablet (5 mg total) by mouth daily. 90 tablet 1  . Spacer/[name]-Holding Chambers (AEROCHAMBER MV) inhaler Use as instructed 1 each 0  . triamcinolone ointment (KENALOG) 0.1 % Apply 1 application topically 2 (two) times daily as needed. 453.6 g 2   No current facility-administered medications on file prior to visit.

## 2019-10-11 NOTE — Telephone Encounter (Signed)
Eucerin is fine, glad it helps. See if drug store can get in larger bulk to be more convenient and cheaper for you. You can gradually wean off some of the antihistamines as you feel able.

## 2019-10-13 ENCOUNTER — Other Ambulatory Visit: Payer: Self-pay

## 2019-10-13 ENCOUNTER — Ambulatory Visit (INDEPENDENT_AMBULATORY_CARE_PROVIDER_SITE_OTHER)
Admission: RE | Admit: 2019-10-13 | Discharge: 2019-10-13 | Disposition: A | Discharge status: Home / Self Care | Payer: BC Managed Care – PPO | Source: Ambulatory Visit | Attending: Internal Medicine | Admitting: Internal Medicine

## 2019-10-13 DIAGNOSIS — R911 Solitary pulmonary nodule: Secondary | ICD-10-CM

## 2019-10-14 NOTE — Telephone Encounter (Signed)
See my comment about CT scan in "Results". It looks good with no nodule now and nothing to explain the itching.  He should ask Dr Starling Manns, his allergist, about continuing the skin treatment or doing something different.

## 2019-10-14 NOTE — Telephone Encounter (Signed)
Received the following message from patient:  "I am wanting to know if Dr. Annamaria Boots has reviewed my CT scan results and does he have a prescribed course of action for any of the  results revealed in the CT scan? I am also still having some good results from using the cream with the itching along with some of the antihistamines. The itching is still present every morning, but the cream helps me through the day and night, but I have to use quite a bit of it. I have tried to decrease the use of antihistamines, which has caused the itching to increase."  I reviewed his chart. It looks like he had a ct chest without contrast yesterday.   I also see that he was prescribed Kenalog cream by Dr. Golda Acre by 09/21/2019.   Dr. Annamaria Boots, please advise. Thanks!

## 2019-10-15 NOTE — Telephone Encounter (Signed)
Dr.Young, Please advise on patient mychart message.  I saw where there might be an issue with a cyst in my right kidney and some fat deposits on my liver? Please advise

## 2019-10-15 NOTE — Telephone Encounter (Signed)
Renal cysts with normal renal function are usually benign.      Suggest you ask your primary care provider if it would be useful to have a renal ultrasound.  Fatty liver is usually associated with being obese. Your liver function is normal, so I don't think anything would be done about this now. If severe, it can be associated with liver damage.       Again, you can discuss this with your primary care provider.

## 2019-10-17 ENCOUNTER — Other Ambulatory Visit: Payer: Self-pay | Admitting: Adult Health

## 2019-10-17 ENCOUNTER — Other Ambulatory Visit: Payer: Self-pay | Admitting: Internal Medicine

## 2019-10-18 NOTE — Telephone Encounter (Signed)
Nystatin refill e-sent

## 2019-10-18 NOTE — Telephone Encounter (Signed)
Dr. Annamaria Boots patient requesting refill on nystatin 100000 unit/ML. Patient was last seen  In our office on 10/01/2019. Can you please advise.

## 2019-10-20 ENCOUNTER — Encounter: Payer: Self-pay | Admitting: Family

## 2019-10-20 ENCOUNTER — Telehealth: Payer: Self-pay | Admitting: Internal Medicine

## 2019-10-20 ENCOUNTER — Other Ambulatory Visit: Payer: Self-pay | Admitting: Adult Health

## 2019-10-26 ENCOUNTER — Other Ambulatory Visit: Payer: Self-pay | Admitting: Family

## 2019-10-26 DIAGNOSIS — Z1211 Encounter for screening for malignant neoplasm of colon: Secondary | ICD-10-CM

## 2019-10-27 ENCOUNTER — Encounter: Payer: Self-pay | Admitting: Gastroenterology

## 2019-11-01 NOTE — Telephone Encounter (Signed)
Please advise if anyone has done or received paperwork for this patient getting [name] [name]?

## 2019-11-02 NOTE — Telephone Encounter (Signed)
Called [facility] and they were using a [place] card. Patient has new insurance and will need a new Fasenra prior authorization. Called patient, he will have card scanned into Epic to initiate new PA. Will follow up.

## 2019-11-02 NOTE — Telephone Encounter (Signed)
Submitted a Prior Authorization request to [facility] for [facility] via Cover My Meds. Will update once we receive a response.  (Key: [id number]) [id number]

## 2019-11-03 ENCOUNTER — Telehealth: Payer: Self-pay | Admitting: Internal Medicine

## 2019-11-03 NOTE — Telephone Encounter (Signed)
Received notification from [employer] that [name] [name] is covered under Medical benefit.  Called [employer] and submitted authorization for the medical benefit. Faxed requested clinicals, will update when we receive a response.  Phone# [phone] Fax# [phone]

## 2019-11-03 NOTE — Telephone Encounter (Signed)
Please disregard message

## 2019-11-03 NOTE — Telephone Encounter (Signed)
Pt is currently receiving Fasenra 30mg q8w at our office. We are starting to transition our patients to self administer at home. Please advise if you believe pt would be a good candidate for this. Thanks.  

## 2019-11-05 NOTE — Telephone Encounter (Signed)
Received 1 month grace period approval (ends 12/03/19) from [facility] for [place] syringe to be administered in office. Letter states patient needs to transition to [facility] Pen, unless we would like to appeal with clinical documentation that patient is unable to self administer.  Auth# [id number] Phone# [phone]

## 2019-11-09 NOTE — Telephone Encounter (Signed)
Faxed clinicals for [employer] for Fasenra Pen authorization. Will follow up.  Phone# [phone] Fax# [phone]

## 2019-11-12 NOTE — Telephone Encounter (Signed)
Called to check status of Fasenra Pen authorization- Rep states she could not see where faxed clinicals were received. Refaxed and will call on Monday to check status.

## 2019-11-15 NOTE — Telephone Encounter (Signed)
Patient called the office asking for an update on Fasenra status.  Advised that approval is still pending and we faxed documentation required for approval on Friday.  Patient is getting nervous that [name] will not be approved through his new insurance in time for his next injection which will be due on 11/30/2019.  Advised we should hear response prior to his next injection and we do have samples in office for emergency use due to insurance coverage issues and such.  Patient verbalized understanding.  Patient requested a return call and/or MyChart message when we receive an update.   Mariella Saa, PharmD, [place], CPP Clinical Specialty Pharmacist [phone]  11/15/2019 3:24 PM

## 2019-11-17 ENCOUNTER — Telehealth: Payer: Self-pay | Admitting: Internal Medicine

## 2019-11-17 NOTE — Telephone Encounter (Signed)
Received notification from [employer] regarding a prior authorization for [employer] [facility] Pen. Authorization has been APPROVED from 11/13/19 to 11/12/20.   Authorization # [id number] Phone # [phone]  Patient is ready to schedule next Fasenra injection to learn to self inject. Patient says next injection is due around 11/30/19.   Please send new prescription for Fasenra Pen to [facility].   Per Othella Boyer 360, patient's copay card is still active and does not require re-enrollment. Patient can call Fasenra 360 for copay card details to be resent to is email if needed.

## 2019-11-18 NOTE — Telephone Encounter (Signed)
Patient approved for self-injecting training. Patient states he has not scheduled next injection in office yet.   Please send in prescription to [facility] for Fasenra Pen. Pharmacist is out of office until next week.  Thanks!  Beatriz Chancellor, CPhT

## 2019-11-18 NOTE — Telephone Encounter (Signed)
Patient has new [facility] insurance and is unable to fill through [employer]. New plan approved Fasenra pens and requires patient to fill through [employer] Closing encounter.  Addressing if ok for patient transition to [employer] in previous encounter.

## 2019-11-22 ENCOUNTER — Telehealth: Payer: Self-pay | Admitting: Internal Medicine

## 2019-11-22 NOTE — Telephone Encounter (Signed)
Routing to injection pool per protocol

## 2019-11-23 MED ORDER — FASENRA PEN 30 MG/ML ~~LOC~~ SOAJ: 30.0000 mg | SUBCUTANEOUS | 6 refills | Status: DC

## 2019-11-23 NOTE — Telephone Encounter (Signed)
Duplicate encounters on same issue.  Addressed in another encounter and closing current encounter.

## 2019-11-25 ENCOUNTER — Ambulatory Visit (AMBULATORY_SURGERY_CENTER): Payer: Self-pay

## 2019-11-25 ENCOUNTER — Other Ambulatory Visit: Payer: Self-pay

## 2019-11-25 VITALS — Temp 97.8°F | Ht 70.0 in | Wt 286.0 lb

## 2019-11-25 DIAGNOSIS — Z1211 Encounter for screening for malignant neoplasm of colon: Secondary | ICD-10-CM

## 2019-11-25 NOTE — Progress Notes (Signed)
No allergies to soy or egg Pt is not on blood thinners or diet pills  Denies issues with sedation/intubation has had one surgery where he had difficulty waking up, but has had surgery since.  Denies atrial flutter/fib  Denies constipation   Emmi instructions given to pt  Pt is aware of Covid safety and care partner requirements.

## 2019-11-26 ENCOUNTER — Telehealth: Payer: Self-pay | Admitting: Internal Medicine

## 2019-11-26 NOTE — Telephone Encounter (Signed)
Fasenra Shipment Received:  30mg  #1 prefilled pen Medication arrival date: 11/26/19 Lot #: [id number] Exp date: 11/18/2020 Received by: Elliot Dally

## 2019-11-30 ENCOUNTER — Ambulatory Visit: Payer: 59

## 2019-11-30 ENCOUNTER — Other Ambulatory Visit: Payer: Self-pay

## 2019-11-30 ENCOUNTER — Telehealth: Payer: Self-pay | Admitting: Internal Medicine

## 2019-11-30 DIAGNOSIS — J4550 Severe persistent asthma, uncomplicated: Secondary | ICD-10-CM | POA: Diagnosis not present

## 2019-11-30 MED ORDER — BENRALIZUMAB 30 MG/ML ~~LOC~~ SOSY
30.0000 mg | PREFILLED_SYRINGE | Freq: Once | SUBCUTANEOUS | Status: AC
  Administered 2019-11-30: 30 mg via SUBCUTANEOUS

## 2019-11-30 NOTE — Telephone Encounter (Signed)
Patient scheduled 11/30/19 at 5pm. Nothing further at this time.

## 2019-11-30 NOTE — Telephone Encounter (Signed)
Checked with Lattie Haw who stated that pt's fasenra was at office. Called and spoke with pt and have scheduled him for appt today at 5pm. Nothing further needed.

## 2019-11-30 NOTE — Progress Notes (Signed)
Have you been hospitalized within the last 10 days?  No Do you have a fever?  No Do you have a cough?  No Do you have a headache or sore throat? No Do you have your Epi Pen visible and is it within date?  Yes   Patient administered fasenra pen into left arm. Patient is scheduled 01/27/20 for next self injection.

## 2019-12-02 ENCOUNTER — Encounter: Payer: Self-pay | Admitting: Gastroenterology

## 2019-12-07 ENCOUNTER — Encounter: Payer: Self-pay | Admitting: Gastroenterology

## 2019-12-07 ENCOUNTER — Other Ambulatory Visit: Payer: Self-pay

## 2019-12-07 ENCOUNTER — Ambulatory Visit (AMBULATORY_SURGERY_CENTER): Payer: 59 | Admitting: Gastroenterology

## 2019-12-07 VITALS — BP 119/94 | HR 79 | Temp 97.1°F | Resp 16 | Ht 70.0 in | Wt 286.0 lb

## 2019-12-07 DIAGNOSIS — D123 Benign neoplasm of transverse colon: Secondary | ICD-10-CM

## 2019-12-07 DIAGNOSIS — D122 Benign neoplasm of ascending colon: Secondary | ICD-10-CM

## 2019-12-07 DIAGNOSIS — D128 Benign neoplasm of rectum: Secondary | ICD-10-CM

## 2019-12-07 DIAGNOSIS — D124 Benign neoplasm of descending colon: Secondary | ICD-10-CM

## 2019-12-07 DIAGNOSIS — Z1211 Encounter for screening for malignant neoplasm of colon: Secondary | ICD-10-CM | POA: Diagnosis present

## 2019-12-07 DIAGNOSIS — K635 Polyp of colon: Secondary | ICD-10-CM

## 2019-12-07 DIAGNOSIS — D125 Benign neoplasm of sigmoid colon: Secondary | ICD-10-CM | POA: Diagnosis not present

## 2019-12-07 HISTORY — PX: COLONOSCOPY: SHX174

## 2019-12-07 MED ORDER — SODIUM CHLORIDE 0.9 % IV SOLN: 500.0000 mL | INTRAVENOUS | Status: DC

## 2019-12-07 NOTE — Op Note (Signed)
[facility] Patient Name: [name] [name] Procedure Date: 12/07/2019 7:56 AM MRN: [id number] Endoscopist: Justice Britain , MD Age: 54 Referring MD:  Date of Birth: 1966-06-25 Gender: Male Account #: [id number] Procedure:                Colonoscopy Indications:              Screening for colorectal malignant neoplasm Medicines:                Monitored Anesthesia Care Procedure:                Pre-Anesthesia Assessment:                           - Prior to the procedure, a History and Physical                            was performed, and patient medications and                            allergies were reviewed. The patient's tolerance of                            previous anesthesia was also reviewed. The risks                            and benefits of the procedure and the sedation                            options and risks were discussed with the patient.                            All questions were answered, and informed consent                            was obtained. Prior Anticoagulants: The patient has                            taken no previous anticoagulant or antiplatelet                            agents except for NSAID medication. ASA Grade                            Assessment: III - A patient with severe systemic                            disease. After reviewing the risks and benefits,                            the patient was deemed in satisfactory condition to                            undergo the procedure.  After obtaining informed consent, the colonoscope                            was passed under direct vision. Throughout the                            procedure, the patient's blood pressure, pulse, and                            oxygen saturations were monitored continuously. The                            Colonoscope was introduced through the anus and                            advanced to the the cecum,  identified by                            appendiceal orifice and ileocecal valve. The                            colonoscopy was performed without difficulty. The                            patient tolerated the procedure. The quality of the                            bowel preparation was good. The ileocecal valve,                            appendiceal orifice, and rectum were photographed. Scope In: 8:13:18 AM Scope Out: 8:49:30 AM Scope Withdrawal Time: 0 hours 32 minutes 50 seconds  Total Procedure Duration: 0 hours 36 minutes 12 seconds  Findings:                 The digital rectal exam findings include                            hemorrhoids. Pertinent negatives include no                            palpable rectal lesions.                           13 sessile and semi-sessile polyps were found in                            the rectum (2), sigmoid colon (1), descending colon                            (1), transverse colon (4), hepatic flexure (1) and                            ascending colon (4). The polyps were 2 to 15 mm in  size. These polyps were removed with a cold snare.                            Resection and retrieval were complete.                           Two semi-sessile and semi-pedunculated polyps were                            found in the transverse colon. The polyps were 20                            to 35 mm in size. Polypectomy was not attempted due                            to need for EMR in hospital-based setting. Area on                            contralateral wall was tattooed with an injection                            of Spot (carbon black) for demarcaration purposes.                           Normal mucosa was found in the entire colon                            otherwise.                           Non-bleeding non-thrombosed external and internal                            hemorrhoids were found during retroflexion, during                             perianal exam and during digital exam. The                            hemorrhoids were Grade II (internal hemorrhoids                            that prolapse but reduce spontaneously). Complications:            No immediate complications. Estimated Blood Loss:     Estimated blood loss was minimal. Impression:               - Hemorrhoids found on digital rectal exam.                           - 13, 2 to 15 mm polyps in the rectum, in the                            sigmoid colon, in the descending colon, in the  transverse colon, at the hepatic flexure and in the                            ascending colon, removed with a cold snare.                            Resected and retrieved.                           - Two 20 to 30 mm polyps. Resection not attempted.                            Tattooed contralateral wall for demarcation                            purposes.                           - Normal mucosa in the entire examined colon                            otherwise.                           - Non-bleeding non-thrombosed external and internal                            hemorrhoids. Recommendation:           - The patient will be observed post-procedure,                            until all discharge criteria are met.                           - Discharge patient to home.                           - Patient has a contact number available for                            emergencies. The signs and symptoms of potential                            delayed complications were discussed with the                            patient. Return to normal activities tomorrow.                            Written discharge instructions were provided to the                            patient.                           - High fiber diet.                           -  Use FiberCon 1 tablet PO daily.                           - Continue present  medications.                           - Minimize NSAIDs as able for next 2-weeks to                            decrease risk of bleeding.                           - Await pathology results.                           - Discussion in clinic about need for repeat                            colonoscopy within next 3-4 months for EMR.                           - The findings and recommendations were discussed                            with the patient.                           - The findings and recommendations were discussed                            with the patient's family. Justice Britain, MD 12/07/2019 9:03:56 AM

## 2019-12-07 NOTE — Progress Notes (Signed)
Temp JB  v/sCW I have reviewed the patient's medical history in detail and updated the computerized patient record.

## 2019-12-07 NOTE — Patient Instructions (Signed)
Handouts on polyps, hemorrhoids, and high fiber diet given to you today  Await pathology results  Start FiberCon 1 tablet daily  Avoid all Asprin products, ibuprofen, and NSAIDS for two weeks. Tylenol is okay to take   YOU HAD AN ENDOSCOPIC PROCEDURE TODAY AT [place]:   Refer to the procedure report that was given to you for any specific questions about what was found during the examination.  If the procedure report does not answer your questions, please call your gastroenterologist to clarify.  If you requested that your care partner not be given the details of your procedure findings, then the procedure report has been included in a sealed envelope for you to review at your convenience later.  YOU SHOULD EXPECT: Some feelings of bloating in the abdomen. Passage of more gas than usual.  Walking can help get rid of the air that was put into your GI tract during the procedure and reduce the bloating. If you had a lower endoscopy (such as a colonoscopy or flexible sigmoidoscopy) you may notice spotting of blood in your stool or on the toilet paper. If you underwent a bowel prep for your procedure, you may not have a normal bowel movement for a few days.  Please Note:  You might notice some irritation and congestion in your nose or some drainage.  This is from the oxygen used during your procedure.  There is no need for concern and it should clear up in a day or so.  SYMPTOMS TO REPORT IMMEDIATELY:   Following lower endoscopy (colonoscopy or flexible sigmoidoscopy):  Excessive amounts of blood in the stool  Significant tenderness or worsening of abdominal pains  Swelling of the abdomen that is new, acute  Fever of 100F or higher  For urgent or emergent issues, a gastroenterologist can be reached at any hour by calling [phone]. Do not use MyChart messaging for urgent concerns.    DIET:  We do recommend a small meal at first, but then you may proceed to your regular  diet.  Drink plenty of fluids but you should avoid alcoholic beverages for 24 hours.  ACTIVITY:  You should plan to take it easy for the rest of today and you should NOT DRIVE or use heavy machinery until tomorrow (because of the sedation medicines used during the test).    FOLLOW UP: Our staff will call the number listed on your records 48-72 hours following your procedure to check on you and address any questions or concerns that you may have regarding the information given to you following your procedure. If we do not reach you, we will leave a message.  We will attempt to reach you two times.  During this call, we will ask if you have developed any symptoms of COVID 19. If you develop any symptoms (ie: fever, flu-like symptoms, shortness of breath, cough etc.) before then, please call [phone].  If you test positive for Covid 19 in the 2 weeks post procedure, please call and report this information to [place].    If any biopsies were taken you will be contacted by phone or by letter within the next 1-3 weeks.  Please call us at [phone] if you have not heard about the biopsies in 3 weeks.    SIGNATURES/CONFIDENTIALITY: You and/or your care partner have signed paperwork which will be entered into your electronic medical record.  These signatures attest to the fact that that the information above on your After Visit Summary has  been reviewed and is understood.  Full responsibility of the confidentiality of this discharge information lies with you and/or your care-partner.

## 2019-12-07 NOTE — Progress Notes (Signed)
Called to room to assist during endoscopic procedure.  Patient ID and intended procedure confirmed with present staff. Received instructions for my participation in the procedure from the performing physician.  

## 2019-12-07 NOTE — Progress Notes (Signed)
Report to PACU, RN, vss, BBS= Clear.  

## 2019-12-08 ENCOUNTER — Ambulatory Visit: Payer: BC Managed Care – PPO | Admitting: Internal Medicine

## 2019-12-09 ENCOUNTER — Ambulatory Visit (INDEPENDENT_AMBULATORY_CARE_PROVIDER_SITE_OTHER): Payer: 59 | Admitting: Internal Medicine

## 2019-12-09 ENCOUNTER — Encounter: Payer: Self-pay | Admitting: Internal Medicine

## 2019-12-09 ENCOUNTER — Telehealth: Payer: Self-pay

## 2019-12-09 ENCOUNTER — Other Ambulatory Visit: Payer: Self-pay

## 2019-12-09 DIAGNOSIS — F411 Generalized anxiety disorder: Secondary | ICD-10-CM

## 2019-12-09 DIAGNOSIS — J3089 Other allergic rhinitis: Secondary | ICD-10-CM | POA: Diagnosis not present

## 2019-12-09 DIAGNOSIS — J449 Chronic obstructive pulmonary disease, unspecified: Secondary | ICD-10-CM | POA: Diagnosis not present

## 2019-12-09 MED ORDER — CLONAZEPAM 1 MG PO TABS: ORAL_TABLET | ORAL | 5 refills | Status: DC

## 2019-12-09 NOTE — Patient Instructions (Signed)
We can continue [facility] sent refilling clonazepam  Hope the colonoscopy goes ok  Please call if we can help

## 2019-12-09 NOTE — Progress Notes (Signed)
Patient ID: [name] [name], male    DOB: March 29, 1966, 54 y.o.   MRN: [id number]  HPI male never smoker followed for chronic obstructive asthma, OSA/quit CPAP, rhinitis/chronic sinusitis Unattended Home Sleep Test 02/09/15- mild OSA/ AHI 9.3/ hr, desat to 85%, weight 276 lbs PFT 03/06/2007 PFT 01/29/2011 Office Spirometry 02/03/2014-severe obstructive airways disease with low FVC.  FVC 3.13/62%, FEV1 2.01/50%, ratio 0.64,  FEF 25-75% 0.87/22% CT sinus 11/23/2009-moderate chronic appearing pansinusitis. Surgery 09/24/2010-polypoid chronic sinusitis CBC with differential 12/28/2015-eosinophils 7.3% CBC w diff 09/28/2018- steroid pattern EOS 0.2 k/ul FENO 10/02/16- 33 H Office spirometry 10/02/16-moderate restriction and obstruction. FVC 2.66/53%, FEV1 2.0/51%, ratio 0.75, FEF 25-75% 1.60/46% IgE 10/02/2016-295 PFT 01/29/2019- Mod obst, mild restrction, slight response to dilator, normal Diffusion --------------------------------------------------------------------------------------   10/01/19- 54 year old male never smoker followed for chronic obstructive asthma      ( Asthma- COPD Overlap Syndrome), OSA/quit CPAP, rhinitis/chronic polypoid sinusitis, Urticaria Itching,  -----f/u Asthma-Chronic obstructive pulmonary disease  Skin bx 09/02/19- " consistent with urticaria" Fasenra,albut hfa, Symbicort 160, Flonase, neb Duoneb, singulair, pred 5 mg daily, Epipen  Says itching began a few days after he had Flu vax, tetanus, Fasenra inj.  Has had Phizer covid vax x 2. Itching is waking him every night around 2-3AM. Had punch bx of skin R forearm dx'd urticaria. His allergist and dermatologist gave topicals, singulair/ xyzal/ pepcid/ benadryl. Not helping.  Virtually no asthma as he continues [place]. Allergist had suggested change to Xolair- discussed. Holding at prednisone 5 mg daily maintenance.  Used rescue hfa once last week, Neb this AM.  He shows me erythematous, excoriated papules on trunk and arms.   --10/05/19- Skin pathologist returned my call- punch biopsy of forearm papule had a few neutrophils, but No eosinophils to suggest an eosinophilic vasculitis like EGPA.  12/09/19- 54 year old male never smoker followed for chronic obstructive asthma ( Asthma- COPD Overlap Syndrome), OSA/quit CPAP, rhinitis/chronic polypoid sinusitis, Urticaria, Colon polyps, Prednisone 5 mg, singulair, xyzal, neb Duoneb, Allegra, Ventolin hfa, Fasenra, Labs- ANCA neg, CMET ok, U/A few RBC, IgE 365H,  Did well at beach- pruritic rash substantially improved. He says dermatologist suggested rash was "nerves". [name] is helping breathing with much less wheeze and dyspnea. He is learning to self-inject. Clonazepam helps nerves and sleep. Allergy Skin Testing 09/21/19- Dr Verlin Fester- + dust mites CT chest 10/13/19-  IMPRESSION: No acute intrathoracic pathology.  No pulmonary nodule.   Review of Systems-See HPI       + = positive Constitutional:    No- night sweats ,fevers, chills, fatigue, lassitude. HEENT:   No - headaches,  Difficulty swallowing, Tooth/dental problems, Sore throat,                No sneezing, itching, ear ache, CV:  No chest pain,  Orthopnea, PND, swelling in lower extremities, anasarca, dizziness, palpitations GI  No heartburn, indigestion, abdominal pain, nausea, vomiting,  Resp:.See HPI    productive cough   No coughing up of blood.  change in color of mucus,  + wheeze Skin: + HPI GU: . MS:  No joint pain or swelling.  [name] Psych:   change in mood or affect.  depression and  anxiety.  No memory loss.   Objective:   Physical Exam General- Alert, Oriented, Affect-appropriate, Distress- none acute, + Obese / thick neck Skin- + birthmark R hand Lymphadenopathy- none Head- atraumatic            Eyes- Gross vision intact, PERRLA, conjunctivae clear secretions  Ears- Hearing, canals normal            Nose- Clear, no-Septal dev, mucus, polyps, erosion, perforation             Throat-  Mallampati  III- IV , mucosa+ geographic, drainage- none, tonsils- atrophic. , Neck- flexible , trachea midline, no stridor , thyroid nl, carotid no bruit Chest - symmetrical excursion , unlabored           Heart/CV- RRR , no murmur , no gallop  , no rub, nl s1 s2                           - JVD- none , edema- none, stasis changes- none, varices- none           Lung-  Wheeze-none, clear, unlabored,   cough -None , dullness-none, rub- none           Chest wall-  Abd-  Br/ Gen/ Rectal- Not done, not indicated Extrem- cyanosis- none, clubbing, none, atrophy- none, strength- nl Neuro- grossly intact to observation

## 2019-12-09 NOTE — Telephone Encounter (Signed)
I hadn't received it until today. Thank you for follow up. Called and spoke with patient for 12 minutes this morning. Went over results. Went over the need for EMRs of his 2 larger polyps in the hospital-based setting. He is feeling better from an anxiety/mood perspective after our discussion. Patty, I believe I had already sent a message to you, but if not, then please move forward with arranging a Colonsocopy with EMR 2-hour slot when I have next availability.   Also please move forward with a clinic visit or telemedicine visit in the next few weeks for Advanced Polypectomy discussion. He wants to go through with it, but I want to discuss it in greater detail. Thanks. GM

## 2019-12-09 NOTE — Telephone Encounter (Signed)
I spoke with the pt and he was at work and states it is difficult to schedule.  He was given 01/18/20 at 250 pm to discuss procedure and prefers to set up the colon EMR at the time of the appt.

## 2019-12-09 NOTE — Telephone Encounter (Signed)
  Follow up Call-  Call back number 12/07/2019  Post procedure Call Back phone  # [phone]  Permission to leave phone message Yes  Some recent data might be hidden     Patient questions:  Do you have a fever, pain , or abdominal swelling? No. Patient complains of soreness and discomfort in rectal area. Pain Score  0*  Have you tolerated food without any problems? Yes.    Have you been able to return to your normal activities? Yes.   Patient stated he felt he needed to cancel an appointment he had yesterday d/t not feeling up to going to that appointment and working.  Says today he is feeling a little better just raw and tender in anal/rectal area.  Do you have any questions about your discharge instructions: Diet   No. Medications  No. Follow up visit  Yes.    Do you have questions or concerns about your Care? Yes.     Patient requests to be called to further discuss the findings as he was to groggy to ask all the questions necessary about the findings.  Wants to know if there is something he can do different to prevent this many polyps in the future.  Wants to know details about having an inpatient procedure.  Seemed very anxious about the findings here at [facility] and said he felt rushed in and out and wasn't afforded the time to process the information.  RN answered all questions at this time to best of her ability.  Actions: * If pain score is 4 or above: No action needed, pain <4.  1. Have you developed a fever since your procedure? no  2.   Have you had an respiratory symptoms (SOB or cough) since your procedure? no  3.   Have you tested positive for COVID 19 since your procedure no  4.   Have you had any family members/close contacts diagnosed with the COVID 19 since your procedure?  no   If yes to any of these questions please route to Joylene John, RN and Erenest Rasher, RN

## 2019-12-10 NOTE — Assessment & Plan Note (Signed)
Clonazepam is helping and can be continued

## 2019-12-10 NOTE — Assessment & Plan Note (Signed)
Spring pollen season is coming to a close now. Rhinitis and reactive airway symptoms have been improving. He is clearly feeling better than in a long time. [name] contributes to some of this.

## 2019-12-10 NOTE — Assessment & Plan Note (Signed)
Seems to be doing better with [name]. We discussed how to begin very slow steroid taper, alternating 5 mg and 4 mg, every other day.

## 2019-12-11 ENCOUNTER — Other Ambulatory Visit: Payer: Self-pay | Admitting: Adult Health

## 2019-12-11 ENCOUNTER — Other Ambulatory Visit: Payer: Self-pay | Admitting: Internal Medicine

## 2019-12-12 ENCOUNTER — Encounter: Payer: Self-pay | Admitting: Gastroenterology

## 2019-12-13 ENCOUNTER — Other Ambulatory Visit: Payer: Self-pay | Admitting: *Deleted

## 2019-12-22 ENCOUNTER — Telehealth: Payer: Self-pay | Admitting: Internal Medicine

## 2019-12-22 MED ORDER — IPRATROPIUM-ALBUTEROL 0.5-2.5 (3) MG/3ML IN SOLN: RESPIRATORY_TRACT | 1 refills | Status: DC

## 2019-12-22 NOTE — Telephone Encounter (Signed)
Spoke with pt and advised Rx was sent into the pharmacy of choice. Pt understood and nothing further is needed.   

## 2020-01-06 ENCOUNTER — Other Ambulatory Visit: Payer: Self-pay | Admitting: Internal Medicine

## 2020-01-06 MED ORDER — CLORAZEPATE DIPOTASSIUM 7.5 MG PO TABS: 7.5000 mg | ORAL_TABLET | Freq: Two times a day (BID) | ORAL | 5 refills | Status: DC | PRN

## 2020-01-06 NOTE — Telephone Encounter (Signed)
Chlorazepate refill e-sent

## 2020-01-06 NOTE — Telephone Encounter (Signed)
Received a MyChart message from patient asking for a refill on his clorazepate 7.5mg . His last OV was on 12/09/19. His last refill for this medication was on 06/24/19 for 5 refills. His next OV is scheduled for 06/05/20.   Pharmacy is [facility] on [address].   Dr. Annamaria Boots, please advise if you are ok with this refill. Thanks!

## 2020-01-10 ENCOUNTER — Other Ambulatory Visit: Payer: Self-pay | Admitting: Internal Medicine

## 2020-01-11 NOTE — Telephone Encounter (Signed)
Pt is requesting refill looks like a taper is it ok to fill

## 2020-01-11 NOTE — Telephone Encounter (Signed)
Pt was informed prednisone was sent in

## 2020-01-11 NOTE — Telephone Encounter (Signed)
Prednisone 5 mg tabs refilled

## 2020-01-13 ENCOUNTER — Telehealth: Payer: Self-pay | Admitting: Internal Medicine

## 2020-01-14 NOTE — Telephone Encounter (Signed)
[name] Order: 30mg  #1 prefilled pen Ordered date: 01/14/20 Expected date of arrival: 01/17/20 Ordered by: [place]

## 2020-01-17 ENCOUNTER — Ambulatory Visit: Payer: 59 | Admitting: Gastroenterology

## 2020-01-18 ENCOUNTER — Ambulatory Visit: Payer: 59 | Admitting: Gastroenterology

## 2020-01-18 NOTE — Telephone Encounter (Signed)
Fasenra Shipment Received:  30mg  #1 pen Medication arrival date: 01/18/2020 Lot #: [id number] Exp date: 03/2021 Received by: Desmond Dike, [place]

## 2020-01-27 ENCOUNTER — Ambulatory Visit (INDEPENDENT_AMBULATORY_CARE_PROVIDER_SITE_OTHER): Payer: 59

## 2020-01-27 ENCOUNTER — Other Ambulatory Visit: Payer: Self-pay

## 2020-01-27 DIAGNOSIS — J449 Chronic obstructive pulmonary disease, unspecified: Secondary | ICD-10-CM | POA: Diagnosis not present

## 2020-01-27 MED ORDER — BENRALIZUMAB 30 MG/ML ~~LOC~~ SOSY
30.0000 mg | PREFILLED_SYRINGE | Freq: Once | SUBCUTANEOUS | Status: AC
  Administered 2020-01-27: 30 mg via SUBCUTANEOUS

## 2020-01-27 NOTE — Progress Notes (Signed)
Have you been hospitalized within the last 10 days?  No Do you have a fever?  No Do you have a cough?  No Do you have a headache or sore throat? No Do you have your Epi Pen visible and is it within date?  Yes   Patient demonstrated and administered fasenra pen.  No problems or complaints.

## 2020-02-29 ENCOUNTER — Encounter: Payer: Self-pay | Admitting: Gastroenterology

## 2020-02-29 ENCOUNTER — Ambulatory Visit (INDEPENDENT_AMBULATORY_CARE_PROVIDER_SITE_OTHER): Payer: 59 | Admitting: Gastroenterology

## 2020-02-29 ENCOUNTER — Other Ambulatory Visit (INDEPENDENT_AMBULATORY_CARE_PROVIDER_SITE_OTHER): Payer: 59

## 2020-02-29 VITALS — BP 132/80 | HR 64 | Ht 70.0 in | Wt 287.8 lb

## 2020-02-29 DIAGNOSIS — Z86010 Personal history of colonic polyps: Secondary | ICD-10-CM

## 2020-02-29 DIAGNOSIS — R933 Abnormal findings on diagnostic imaging of other parts of digestive tract: Secondary | ICD-10-CM

## 2020-02-29 LAB — CBC
HCT: 46.4 % (ref 39.0–52.0)
Hemoglobin: 15.1 g/dL (ref 13.0–17.0)
MCHC: 32.5 g/dL (ref 30.0–36.0)
MCV: 89.9 fl (ref 78.0–100.0)
Platelets: 219 10*3/uL (ref 150.0–400.0)
RBC: 5.17 Mil/uL (ref 4.22–5.81)
RDW: 15 % (ref 11.5–15.5)
WBC: 7.5 10*3/uL (ref 4.0–10.5)

## 2020-02-29 LAB — BASIC METABOLIC PANEL
BUN: 17 mg/dL (ref 6–23)
CO2: 31 mEq/L (ref 19–32)
Calcium: 8.8 mg/dL (ref 8.4–10.5)
Chloride: 104 mEq/L (ref 96–112)
Creatinine, Ser: 1.12 mg/dL (ref 0.40–1.50)
GFR: 68.32 mL/min (ref >60.00)
Glucose, Bld: 106 mg/dL — ABNORMAL HIGH (ref 70–99)
Potassium: 4.3 mEq/L (ref 3.5–5.1)
Sodium: 142 mEq/L (ref 135–145)

## 2020-02-29 LAB — PROTIME-INR
INR: 1 ratio (ref 0.8–1.0)
Prothrombin Time: 11.5 s (ref 9.6–13.1)

## 2020-02-29 MED ORDER — SUPREP BOWEL PREP KIT 17.5-3.13-1.6 GM/177ML PO SOLN
1.0000 | ORAL | 0 refills | Status: DC
Start: 2020-02-29 — End: 2020-05-08

## 2020-02-29 NOTE — Patient Instructions (Signed)
If you are age 54 or older, your body mass index should be between 23-30. Your Body mass index is 41.3 kg/m. If this is out of the aforementioned range listed, please consider follow up with your Primary Care Provider.  If you are age 39 or younger, your body mass index should be between 19-25. Your Body mass index is 41.3 kg/m. If this is out of the aformentioned range listed, please consider follow up with your Primary Care Provider.    Your provider has requested that you go to the basement level for lab work before leaving today. Press "B" on the elevator. The lab is located at the first door on the left as you exit the elevator.  You have been scheduled for a colonoscopy. Please follow written instructions given to you at your visit today.  Please pick up your prep supplies at the pharmacy within the next 1-3 days. If you use inhalers (even only as needed), please bring them with you on the day of your procedure.  We have sent the following medications to your pharmacy for you to pick up at your convenience: Suprep   Due to recent changes in healthcare laws, you may see the results of your imaging and laboratory studies on MyChart before your provider has had a chance to review them.  We understand that in some cases there may be results that are confusing or concerning to you. Not all laboratory results come back in the same time frame and the provider may be waiting for multiple results in order to interpret others.  Please give [place] 48 hours in order for your provider to thoroughly review all the results before contacting the office for clarification of your results.   Thank you for choosing me and [facility].  Dr. Rush Landmark

## 2020-03-01 ENCOUNTER — Encounter: Payer: Self-pay | Admitting: Gastroenterology

## 2020-03-01 DIAGNOSIS — Z86010 Personal history of colonic polyps: Secondary | ICD-10-CM | POA: Insufficient documentation

## 2020-03-01 DIAGNOSIS — R933 Abnormal findings on diagnostic imaging of other parts of digestive tract: Secondary | ICD-10-CM | POA: Insufficient documentation

## 2020-03-01 NOTE — Progress Notes (Addendum)
[place] VISIT   Primary Care Provider Marrian Salvage, [place] [address] [phone]  Patient Profile: [name] [name] is a 54 y.o. male with a pmh significant for sleep apnea, obesity, asthma, allergies, glaucoma, skin cancers, nephrolithiasis, colon polyps.  The patient presents to the [facility] for an evaluation and management of problem(s) noted below:  Problem List 1. Hx of adenomatous colonic polyps   2. Abnormal colonoscopy     History of Present Illness This is a patient that I met in May of this year as a direct procedure colonoscopy for first colon cancer screening colonoscopy.  Multiple colon polyps were found.  Two large colon polyps in the transverse colon were found and consistent endoscopically with adenomatous appearing polyps.  The rest of the 13 polyps were removed.  The pathology showed a combination of tubular adenomas and tubulovillous adenomas.  The patient returns today for further discussion of potential EMR/advanced polyp resection of the two larger polyps.  Patient has done well post procedure.  He actually seems to have better flow of his bowels on a daily basis.  He is not taking any increased fiber at this time.  No blood in his stools.  No abdominal pain.  GI Review of Systems Positive as above Negative for pyrosis, dysphagia, odynophagia, nausea, vomiting, pain  Review of Systems General: Denies fevers/chills/weight loss unintentionally HEENT: Denies oral lesions Cardiovascular: Denies chest pain Pulmonary: Denies shortness of breath Gastroenterological: See HPI Genitourinary: Denies darkened urine Hematological: Denies easy bruising/bleeding Dermatological: Denies jaundice Psychological: Mood is stable   Medications Current Outpatient Medications  Medication Sig Dispense Refill  . albuterol (VENTOLIN HFA) 108 (90 Base) MCG/ACT inhaler INHALE 2 PUFFS INTO THE  LUNGS EVER2Y 6 HOURS AS NEEDED FOR WHEEZING OR FOR SHORTNESS OF BREATH 6.7 g 5  . Benralizumab (FASENRA PEN) 30 MG/ML SOAJ Inject 30 mg into the skin every 8 (eight) weeks. 1 pen 6  . budesonide-formoterol (SYMBICORT) 160-4.5 MCG/ACT inhaler INHALE 2 PUFFS INTO THE LUNGS TWICE A DAY RINSE MOUTH AFTER USE 30.6 Inhaler 3  . clonazePAM (KLONOPIN) 1 MG tablet 1 or 2 before bed for sleep 30 tablet 5  . clorazepate (TRANXENE) 7.5 MG tablet Take 1 tablet (7.5 mg total) by mouth 2 (two) times daily as needed. 60 tablet 5  . desoximetasone (TOPICORT) 0.25 % cream APPLY TO AFFECTED AREA DAILY X7 DAYS 60 g 1  . Doxepin HCl 3 MG TABS     . EPINEPHrine 0.3 mg/0.3 mL IJ SOAJ injection Inject 0.3 mLs (0.3 mg total) into the muscle once for 1 dose. 1 Device 11  . Fexofenadine HCl (ALLEGRA PO) Take by mouth.    . fluticasone (FLONASE) 50 MCG/ACT nasal spray SPRAY 2 SPRAYS INTO EACH NOSTRIL EVERY DAY 48 mL 3  . ibuprofen (ADVIL,MOTRIN) 800 MG tablet Take 800 mg by mouth every 8 (eight) hours as needed.    [name] ipratropium-albuterol (DUONEB) 0.5-2.5 (3) MG/3ML SOLN INHALE 1 VIAL VIA NEBULIZER 4 TIMES A DAY AS NEEDED 1080 mL 1  . levocetirizine (XYZAL) 5 MG tablet 1 tablet 3 times daily. 90 tablet 5  . montelukast (SINGULAIR) 10 MG tablet TAKE 1 TABLET BY MOUTH EVERY DAY 90 tablet 1  . Nebulizers (COMPRESSOR NEBULIZER) MISC 1 Device by Does not apply route 4 (four) times daily as needed. 1 each 0  . nystatin (MYCOSTATIN) 100000 UNIT/ML suspension TAKE 5 MLS BY MOUTH 4 TIMES DAILY. SWISH AND SWALLOW 60 mL  3  . predniSONE (DELTASONE) 1 MG tablet 4 tabs daily x 1 week, 3 tabs daily x 1 week, 2 tabs daily x 1 week, then one tab daily 100 tablet 1  . predniSONE (DELTASONE) 5 MG tablet TAKE 1 TABLET BY MOUTH EVERY DAY 90 tablet 1  . Spacer/[name]-Holding Chambers (AEROCHAMBER MV) inhaler Use as instructed 1 each 0  . triamcinolone ointment (KENALOG) 0.1 % Apply 1 application topically 2 (two) times daily as needed. 453.6 g 2   . Na Sulfate-K Sulfate-Mg Sulf (SUPREP BOWEL PREP KIT) 17.5-3.13-1.6 GM/177ML SOLN Take 1 kit by mouth as directed. For colonoscopy prep 354 mL 0   No current facility-administered medications for this visit.    Allergies Allergies  Allergen Reactions  . Fluticasone-Salmeterol Other (See Comments)    REACTION: jaw joint pain    Histories Past Medical History:  Diagnosis Date  . Allergic rhinitis   . Allergy    seasonal allergies  . Angio-edema   . Cancer ([place])    basal cell  . Complication of anesthesia    difficulty waking up from lithotripsy and nasal surgery  . Glaucoma    has occasional pressure issues in eyes  . History of chronic bronchitis   . History of kidney stones   . Left ureteral stone   . Mild obstructive sleep apnea    per study 02-09-2015  recommended oral appliance / cpap-- pt tried intolerant  . Moderate persistent asthma    pulmologist-  dr young  . Urticaria    Past Surgical History:  Procedure Laterality Date  . CYSTO/  LEFT URETERAL STENT PLACEMENT  03-10-2002  . CYSTOSCOPY W/ URETERAL STENT PLACEMENT Left 08/07/2015   Procedure: CYSTOSCOPY, Left Retrograde Pyelogram, Left Ureteral Stent Placement;  Surgeon: Irine Seal, MD;  Location: [facility];  Service: Urology;  Laterality: Left;  . CYSTOSCOPY/URETEROSCOPY/HOLMIUM LASER/STENT PLACEMENT Left 08/17/2015   Procedure: CYSTOSCOPY / LEFT URETEROSCOPY / HOLMIUM LASER LITHOTRIPSY;  Surgeon: Irine Seal, MD;  Location: [facility];  Service: Urology;  Laterality: Left;  . EXTRACORPOREAL SHOCK WAVE LITHOTRIPSY  2006  . SEPTOPLASTY WITH ETHMOIDECTOMY, AND MAXILLARY ANTROSTOMY  09-24-2010  . SINOSCOPY    . STONE EXTRACTION WITH BASKET Left 08/17/2015   Procedure: STONE EXTRACTION WITH BASKET;  Surgeon: Irine Seal, MD;  Location: [facility];  Service: Urology;  Laterality: Left;  . [place] EXTRACTION  1994   Social History   Socioeconomic History  . Marital status: Married     Spouse name: Not on file  . Number of children: Not on file  . Years of education: Not on file  . Highest education level: Not on file  Occupational History  . Not on file  Tobacco Use  . Smoking status: Never Smoker  . Smokeless tobacco: Never Used  Vaping Use  . Vaping Use: Never used  Substance and Sexual Activity  . Alcohol use: Yes    Comment: socially  . Drug use: Never  . Sexual activity: Not on file  Other Topics Concern  . Not on file  Social History Narrative  . Not on file   Social Determinants of Health   Financial Resource Strain:   . Difficulty of Paying Living Expenses:   Food Insecurity:   . Worried About Charity fundraiser in the Last Year:   . Arboriculturist in the Last Year:   Transportation Needs:   . Film/video editor (Medical):   [name] Lack of Transportation (Non-Medical):  Physical Activity:   . Days of Exercise per Week:   . Minutes of Exercise per Session:   Stress:   . Feeling of Stress :   Social Connections:   . Frequency of Communication with Friends and Family:   . Frequency of Social Gatherings with Friends and Family:   . Attends Religious Services:   . Active Member of Clubs or Organizations:   . Attends Archivist Meetings:   [name] Marital Status:   Intimate Partner Violence:   . Fear of Current or Ex-Partner:   . Emotionally Abused:   [name] Physically Abused:   . Sexually Abused:    Family History  Problem Relation Age of Onset  . Asthma Father   . Other Father        bronchitis  . Allergic rhinitis Father   . Diabetes Maternal Grandfather 98       Heavy smoker  . Eczema Neg Hx   . Immunodeficiency Neg Hx   . Urticaria Neg Hx   . Colon polyps Neg Hx   . Colon cancer Neg Hx   . Esophageal cancer Neg Hx   . Rectal cancer Neg Hx   . Stomach cancer Neg Hx    I have reviewed his medical, social, and family history in detail and updated the electronic medical record as necessary.    PHYSICAL EXAMINATION  BP  132/80   Pulse 64   Ht 5' 10"  (1.778 m)   Wt 287 lb 12.8 oz (130.5 kg)   BMI 41.30 kg/m  Wt Readings from Last 3 Encounters:  02/29/20 287 lb 12.8 oz (130.5 kg)  12/09/19 280 lb (127 kg)  12/07/19 286 lb (129.7 kg)  GEN: NAD, appears stated age, doesn't appear chronically ill PSYCH: Cooperative, without pressured speech EYE: Conjunctivae pink, sclerae anicteric ENT: MMM CV: Nontachycardic RESP: Today no audible wheezing GI: NABS, soft, protuberant abdomen, rounded, obese, NT/ND, without rebound MSK/EXT: No significant lower extremity edema noted SKIN: No jaundice NEURO:  Alert & Oriented x 3, no focal deficits   REVIEW OF DATA  I reviewed the following data at the time of this encounter:  GI Procedures and Studies  May 2020 colonoscopy - Hemorrhoids found on digital rectal exam. - 10, 2 to 15 mm polyps in the rectum, in the sigmoid colon, in the descending colon, in the transverse colon, at the hepatic flexure and in the ascending colon, removed with a cold snare. Resected and retrieved. - Two 20 to 30 mm polyps. Resection not attempted. Tattooed contralateral wall for demarcation purposes. - Normal mucosa in the entire examined colon otherwise. - Non-bleeding non-thrombosed external and internal hemorrhoids. Pathology Diagnosis Surgical [P], colon, ascending, hepatic flexure, transverse, descending, sigmoid, rectal, polyp (13) - MULTIPLE FRAGMENTS OF TUBULAR AND TUBULOVILLOUS ADENOMA. - HYPERPLASTIC POLYP. - NO HIGH-GRADE DYSPLASIA OR MALIGNANCY.  Laboratory Studies  Reviewed those in epic  Imaging Studies  No relevant studies to review   ASSESSMENT  Mr. Dejarnette is a 54 y.o. male  with a pmh significant for sleep apnea, obesity, asthma, allergies, glaucoma, skin cancers, nephrolithiasis, colon polyps.  The patient is seen today for evaluation and management of:  1. Hx of adenomatous colonic polyps   2. Abnormal colonoscopy    The patient is hemodynamically and  clinically stable.  He had quite a few precancerous polyps found on his for screening colonoscopy.  Two larger polyps that I did not resect that time as a result of the need for advanced polyp EMR.  Today we discussed the role of advanced polypectomy.  Knowing the lesions since I saw them myself, I do feel that it is reasonable to pursue an Advanced Polypectomy attempt of the polyp/lesion.  We discussed some of the techniques of advanced polypectomy which include Endoscopic Mucosal Resection, OVESCO Full-Thickness Resection, Endorotor Morcellation, and Tissue Ablation via Fulguration.  The risks and benefits of endoscopic evaluation were discussed with the patient; these include but are not limited to the risk of perforation, infection, bleeding, missed lesions, lack of diagnosis, severe illness requiring hospitalization, as well as anesthesia and sedation related illnesses.  During attempts at advanced resection, the risks of bleeding and perforation/leak are increased as opposed to diagnostic and screening procedures, and that was discussed with the patient as well.   In addition, I explained that with the possible need for piecemeal resection, subsequent short-interval endoscopic evaluation for follow up and potential retreatment of the lesion/area may be necessary.  I did offer, a referral to surgery in order for patient to have opportunity to discuss surgical management/intervention prior to finalizing decision for attempt at endoscopic removal, however, the patient deferred on this.  If, after attempt at removal of the polyp/lesion, it is found that the patient has a complication or that an invasive lesion or malignant lesion is found, or that the polyp/lesion continues to recur, the patient is aware and understands that surgery may still be indicated/required.  All patient questions were answered, to the best of my ability, and the patient agrees to the aforementioned plan of action with follow-up as  indicated.   PLAN  Preprocedure labs to be obtained Proceed with scheduling colonoscopy with EMR attempt Holding on genetics testing for now but certainly in the setting of the number of polyps found we may consider that in the future   Orders Placed This Encounter  Procedures  . Procedural/ Surgical Case Request: COLONOSCOPY WITH PROPOFOL, ENDOSCOPIC MUCOSAL RESECTION  . CBC  . Basic Metabolic Panel (BMET)  . INR/PT  . Ambulatory referral to Gastroenterology    New Prescriptions   NA SULFATE-K SULFATE-MG SULF (SUPREP BOWEL PREP KIT) 17.5-3.13-1.6 GM/177ML SOLN    Take 1 kit by mouth as directed. For colonoscopy prep   Modified Medications   No medications on file    Planned Follow Up No follow-ups on file.   Total Time in Face-to-Face and in Coordination of Care for patient including independent/personal interpretation/review of prior testing, medical history, examination, medication adjustment, communicating results with the patient directly, and documentation with the EHR is 25 minutes.   Justice Britain, MD [facility] Advanced Endoscopy Office # [phone]

## 2020-03-15 NOTE — Telephone Encounter (Signed)
Dr. Annamaria Boots please advise on patient mychart message  I have been hearing a lot about the [place] booster shot lately. Wanting to know if Dr. Annamaria Boots can help me get the [place] booster shot since I am in a higher bracket for trouble and hospitalization if I contracting covid 19. My 2nd [place] shot was given to me on August 27, 2019. Thank you , [name] [name]. [name] D.O.B. 03-24-1966

## 2020-03-15 NOTE — Telephone Encounter (Signed)
They are recommending booster shots 8 months after your last covid vaccine, which would make you eligible in October. I don't think there would be a problem if you went back and asked for a booster a little earlier than that. Please let us know when you get it.

## 2020-04-07 ENCOUNTER — Ambulatory Visit: Payer: 59 | Admitting: Gastroenterology

## 2020-05-04 ENCOUNTER — Other Ambulatory Visit (HOSPITAL_COMMUNITY)
Admission: RE | Admit: 2020-05-04 | Discharge: 2020-05-04 | Disposition: A | Discharge status: Home / Self Care | Payer: 59 | Source: Ambulatory Visit | Attending: Gastroenterology | Admitting: Gastroenterology

## 2020-05-04 DIAGNOSIS — Z20822 Contact with and (suspected) exposure to covid-19: Secondary | ICD-10-CM | POA: Diagnosis not present

## 2020-05-04 DIAGNOSIS — Z01812 Encounter for preprocedural laboratory examination: Secondary | ICD-10-CM | POA: Insufficient documentation

## 2020-05-04 LAB — SARS CORONAVIRUS 2 (TAT 6-24 HRS): SARS Coronavirus 2: NEGATIVE

## 2020-05-05 ENCOUNTER — Other Ambulatory Visit: Payer: Self-pay

## 2020-05-05 ENCOUNTER — Encounter (HOSPITAL_COMMUNITY): Payer: Self-pay | Admitting: Gastroenterology

## 2020-05-05 NOTE — Progress Notes (Signed)
Patient denies shortness of breath, fever, cough or chest pain.  PCP - Jodi Mourning, FNP Cardiologist - n/a Pulmonology - Dr Annamaria Boots  Chest x-ray - 10/01/19 (2V) EKG - n/a Stress Test - n/a ECHO - n/a Cardiac Cath - n/a  STOP now taking any Aspirin (unless otherwise instructed by your surgeon), Aleve, Naproxen, Ibuprofen, Motrin, Advil, Goody's, BC's, all herbal medications, fish oil, and all vitamins.   Coronavirus Screening Covid test on 05/04/20 was negative.  Patient verbalized understanding of instructions that were given via phone.

## 2020-05-08 ENCOUNTER — Encounter (HOSPITAL_COMMUNITY)
Admission: RE | Disposition: A | Discharge status: Home / Self Care | Payer: Self-pay | Source: Home / Self Care | Attending: Gastroenterology

## 2020-05-08 ENCOUNTER — Other Ambulatory Visit: Payer: Self-pay

## 2020-05-08 ENCOUNTER — Ambulatory Visit (HOSPITAL_COMMUNITY)
Admission: RE | Admit: 2020-05-08 | Discharge: 2020-05-08 | Disposition: A | Discharge status: Home / Self Care | Payer: 59 | Attending: Gastroenterology | Admitting: Gastroenterology

## 2020-05-08 ENCOUNTER — Ambulatory Visit (HOSPITAL_COMMUNITY): Payer: 59 | Admitting: Certified Registered Nurse Anesthetist

## 2020-05-08 ENCOUNTER — Encounter (HOSPITAL_COMMUNITY): Payer: Self-pay | Admitting: Gastroenterology

## 2020-05-08 DIAGNOSIS — K641 Second degree hemorrhoids: Secondary | ICD-10-CM | POA: Insufficient documentation

## 2020-05-08 DIAGNOSIS — Z85828 Personal history of other malignant neoplasm of skin: Secondary | ICD-10-CM | POA: Diagnosis not present

## 2020-05-08 DIAGNOSIS — D123 Benign neoplasm of transverse colon: Secondary | ICD-10-CM | POA: Insufficient documentation

## 2020-05-08 DIAGNOSIS — K635 Polyp of colon: Secondary | ICD-10-CM | POA: Diagnosis present

## 2020-05-08 DIAGNOSIS — D125 Benign neoplasm of sigmoid colon: Secondary | ICD-10-CM | POA: Insufficient documentation

## 2020-05-08 HISTORY — PX: POLYPECTOMY: SHX5525

## 2020-05-08 HISTORY — PX: HEMOSTASIS CLIP PLACEMENT: SHX6857

## 2020-05-08 HISTORY — PX: SUBMUCOSAL LIFTING INJECTION: SHX6855

## 2020-05-08 HISTORY — PX: COLONOSCOPY WITH PROPOFOL: SHX5780

## 2020-05-08 HISTORY — PX: ENDOSCOPIC MUCOSAL RESECTION: SHX6839

## 2020-05-08 SURGERY — COLONOSCOPY WITH PROPOFOL
Anesthesia: Monitor Anesthesia Care

## 2020-05-08 MED ORDER — LIDOCAINE 2% (20 MG/ML) 5 ML SYRINGE
INTRAMUSCULAR | Status: DC | PRN
  Administered 2020-05-08: 50 mg via INTRAVENOUS

## 2020-05-08 MED ORDER — PROPOFOL 10 MG/ML IV BOLUS
INTRAVENOUS | Status: DC | PRN
  Administered 2020-05-08: 20 mg via INTRAVENOUS
  Administered 2020-05-08: 30 mg via INTRAVENOUS
  Administered 2020-05-08: 20 mg via INTRAVENOUS
  Administered 2020-05-08: 30 mg via INTRAVENOUS
  Administered 2020-05-08 (×2): 20 mg via INTRAVENOUS

## 2020-05-08 MED ORDER — PROPOFOL 500 MG/50ML IV EMUL
INTRAVENOUS | Status: DC | PRN
  Administered 2020-05-08: 100 ug/kg/min via INTRAVENOUS

## 2020-05-08 MED ORDER — ONDANSETRON HCL 4 MG/2ML IJ SOLN
INTRAMUSCULAR | Status: DC | PRN
  Administered 2020-05-08: 4 mg via INTRAVENOUS

## 2020-05-08 MED ORDER — LACTATED RINGERS IV SOLN: INTRAVENOUS | Status: DC

## 2020-05-08 SURGICAL SUPPLY — 22 items

## 2020-05-08 NOTE — Transfer of Care (Signed)
Immediate Anesthesia Transfer of Care Note  Patient: [name] [name]  Procedure(s) Performed: COLONOSCOPY WITH PROPOFOL (N/A ) ENDOSCOPIC MUCOSAL RESECTION (N/A ) SUBMUCOSAL LIFTING INJECTION POLYPECTOMY FOREIGN BODY REMOVAL HEMOSTASIS CLIP PLACEMENT  Patient Location: PACU  Anesthesia Type:MAC  Level of Consciousness: awake, alert  and oriented  Airway & Oxygen Therapy: Patient Spontanous Breathing  Post-op Assessment: Report given to RN and Post -op Vital signs reviewed and stable  Post vital signs: Reviewed and stable  Last Vitals:  Vitals Value Taken Time  BP 125/84 05/08/20 0852  Temp    Pulse 90 05/08/20 0855  Resp 16 05/08/20 0855  SpO2 100 % 05/08/20 0855  Vitals shown include unvalidated device data.  Last Pain:  Vitals:   05/08/20 0852  TempSrc: Oral  PainSc: 0-No pain         Complications: No complications documented.

## 2020-05-08 NOTE — H&P (Signed)
GASTROENTEROLOGY PROCEDURE H&P NOTE   Primary Care Physician: Marrian Salvage, FNP  HPI: [name] [name] is a 54 y.o. male who presents for Colonoscopy with attempt at EMR of 2 larger transverse colon polyps and further surveillance in setting of history of multiple colorectal adenomas.  Past Medical History:  Diagnosis Date  . Allergic rhinitis   . Allergy    seasonal allergies  . Angio-edema   . Cancer ([place])    basal cell-nose  . Complication of anesthesia    difficulty waking up from lithotripsy and nasal surgery  . Glaucoma    has occasional pressure issues in eyes  . History of chronic bronchitis   . History of kidney stones   . Left ureteral stone   . Mild obstructive sleep apnea    per study 02-09-2015  recommended oral appliance / cpap-- pt tried intolerant  . Moderate persistent asthma    pulmologist-  dr young  . Pneumonia 1994  . Urticaria    Past Surgical History:  Procedure Laterality Date  . COLONOSCOPY  12/07/2019   polyps  . CYSTO/  LEFT URETERAL STENT PLACEMENT  03-10-2002  . CYSTOSCOPY W/ URETERAL STENT PLACEMENT Left 08/07/2015   Procedure: CYSTOSCOPY, Left Retrograde Pyelogram, Left Ureteral Stent Placement;  Surgeon: Irine Seal, MD;  Location: [facility];  Service: Urology;  Laterality: Left;  . CYSTOSCOPY/URETEROSCOPY/HOLMIUM LASER/STENT PLACEMENT Left 08/17/2015   Procedure: CYSTOSCOPY / LEFT URETEROSCOPY / HOLMIUM LASER LITHOTRIPSY;  Surgeon: Irine Seal, MD;  Location[facility];  Service: Urology;  Laterality: Left;  . EXTRACORPOREAL SHOCK WAVE LITHOTRIPSY  2006  . SEPTOPLASTY WITH ETHMOIDECTOMY, AND MAXILLARY ANTROSTOMY  09-24-2010  . SINOSCOPY    . STONE EXTRACTION WITH BASKET Left 08/17/2015   Procedure: STONE EXTRACTION WITH BASKET;  Surgeon: Irine Seal, MD;  Location: [facility];  Service: Urology;  Laterality: Left;  . [place] TO[place] EXTRACTION  1994   Current Facility-Administered Medications    Medication Dose Route Frequency Provider Last Rate Last Admin  . lactated ringers infusion   Intravenous Continuous Mansouraty, Telford Nab., MD       No Known Allergies Family History  Problem Relation Age of Onset  . Asthma Father   . Other Father        bronchitis  . Allergic rhinitis Father   . Diabetes Maternal Grandfather 53       Heavy smoker  . Eczema Neg Hx   . Immunodeficiency Neg Hx   . Urticaria Neg Hx   . Colon polyps Neg Hx   . Colon cancer Neg Hx   . Esophageal cancer Neg Hx   . Rectal cancer Neg Hx   . Stomach cancer Neg Hx   . Inflammatory bowel disease Neg Hx   . Liver disease Neg Hx   . Pancreatic cancer Neg Hx    Social History   Socioeconomic History  . Marital status: Married    Spouse name: Not on file  . Number of children: Not on file  . Years of education: Not on file  . Highest education level: Not on file  Occupational History  . Not on file  Tobacco Use  . Smoking status: Never Smoker  . Smokeless tobacco: Never Used  Vaping Use  . Vaping Use: Never used  Substance and Sexual Activity  . Alcohol use: Yes    Comment: socially   . Drug use: Never  . Sexual activity: Yes  Other Topics Concern  . Not on  file  Social History Narrative  . Not on file   Social Determinants of Health   Financial Resource Strain:   . Difficulty of Paying Living Expenses: Not on file  Food Insecurity:   . Worried About Charity fundraiser in the Last Year: Not on file  . Ran Out of Food in the Last Year: Not on file  Transportation Needs:   . Lack of Transportation (Medical): Not on file  . Lack of Transportation (Non-Medical): Not on file  Physical Activity:   . Days of Exercise per Week: Not on file  . Minutes of Exercise per Session: Not on file  Stress:   . Feeling of Stress : Not on file  Social Connections:   . Frequency of Communication with Friends and Family: Not on file  . Frequency of Social Gatherings with Friends and Family: Not on  file  . Attends Religious Services: Not on file  . Active Member of Clubs or Organizations: Not on file  . Attends Archivist Meetings: Not on file  . Marital Status: Not on file  Intimate Partner Violence:   . Fear of Current or Ex-Partner: Not on file  . Emotionally Abused: Not on file  . Physically Abused: Not on file  . Sexually Abused: Not on file    Physical Exam: Vital signs in last 24 hours: Temp:  [98.6 F (37 C)] 98.6 F (37 C) (10/18 0720) Pulse Rate:  [85] 85 (10/18 0720) Resp:  [20] 20 (10/18 0720) BP: (151)/(105) 151/105 (10/18 0720) SpO2:  [97 %] 97 % (10/18 0720)   GEN: NAD EYE: Sclerae anicteric ENT: MMM CV: Non-tachycardic GI: Soft, protuberant abdomen, NT NEURO:  Alert & Oriented x 3  Lab Results: No results for input(s): WBC, HGB, HCT, PLT in the last 72 hours. BMET No results for input(s): NA, K, CL, CO2, GLUCOSE, BUN, CREATININE, CALCIUM in the last 72 hours. LFT No results for input(s): PROT, ALBUMIN, AST, ALT, ALKPHOS, BILITOT, BILIDIR, IBILI in the last 72 hours. PT/INR No results for input(s): LABPROT, INR in the last 72 hours.   Impression / Plan: This is a 54 y.o.male who presents for Colonoscopy with attempt at EMR of 2 larger transverse colon polyps and further surveillance in setting of history of multiple colorectal adenomas.  The risks and benefits of endoscopic evaluation were discussed with the patient; these include but are not limited to the risk of perforation, infection, bleeding, missed lesions, lack of diagnosis, severe illness requiring hospitalization, as well as anesthesia and sedation related illnesses.  The patient is agreeable to proceed.    Justice Britain, MD [facility] Advanced Endoscopy Office # [phone]

## 2020-05-08 NOTE — Discharge Instructions (Signed)
Colonoscopy, Adult, Care After This sheet gives you information about how to care for yourself after your procedure. Your doctor may also give you more specific instructions. If you have problems or questions, call your doctor. What can I expect after the procedure? After the procedure, it is common to have:  A small amount of blood in your poop (stool) for 24 hours.  Some gas.  Mild cramping or bloating in your belly (abdomen). Follow these instructions at home: Eating and drinking   Drink enough fluid to keep your pee (urine) pale yellow.  Follow instructions from your doctor about what you cannot eat or drink.  Return to your normal diet as told by your doctor. Avoid heavy or fried foods that are hard to digest. Activity  Rest as told by your doctor.  Do not sit for a long time without moving. Get up to take short walks every 1-2 hours. This is important. Ask for help if you feel weak or unsteady.  Return to your normal activities as told by your doctor. Ask your doctor what activities are safe for you. To help cramping and bloating:   Try walking around.  Put heat on your belly as told by your doctor. Use the heat source that your doctor recommends, such as a moist heat pack or a heating pad. ? Put a towel between your skin and the heat source. ? Leave the heat on for 20-30 minutes. ? Remove the heat if your skin turns bright red. This is very important if you are unable to feel pain, heat, or cold. You may have a greater risk of getting burned. General instructions  For the first 24 hours after the procedure: ? Do not drive or use machinery. ? Do not sign important documents. ? Do not drink alcohol. ? Do your daily activities more slowly than normal. ? Eat foods that are soft and easy to digest.  Take over-the-counter or prescription medicines only as told by your doctor.  Keep all follow-up visits as told by your doctor. This is important. Contact a doctor  if:  You have blood in your poop 2-3 days after the procedure. Get help right away if:  You have more than a small amount of blood in your poop.  You see large clumps of tissue (blood clots) in your poop.  Your belly is swollen.  You feel like you may vomit (nauseous).  You vomit.  You have a fever.  You have belly pain that gets worse, and medicine does not help your pain. Summary  After the procedure, it is common to have a small amount of blood in your poop. You may also have mild cramping and bloating in your belly.  For the first 24 hours after the procedure, do not drive or use machinery, do not sign important documents, and do not drink alcohol.  Get help right away if you have a lot of blood in your poop, feel like you may vomit, have a fever, or have more belly pain. This information is not intended to replace advice given to you by your health care provider. Make sure you discuss any questions you have with your health care provider. Document Revised: 02/01/2019 Document Reviewed: 02/01/2019 Elsevier Patient Education  2020 [employer]  

## 2020-05-08 NOTE — Op Note (Signed)
[facility] Patient Name: [name] [name] Procedure Date : 05/08/2020 MRN: [id number] Attending MD: Justice Britain , MD Date of Birth: 1966/01/25 CSN: [id number] Age: 54 Admit Type: Inpatient Procedure:                Colonoscopy Indications:              Excision of colonic polyp Providers:                Justice Britain, MD, Jeanella Cara, RN,                            Ladona Ridgel, Technician Referring MD:             Marrian Salvage Medicines:                Monitored Anesthesia Care Complications:            No immediate complications. Estimated Blood Loss:     Estimated blood loss was minimal. Procedure:                Pre-Anesthesia Assessment:                           - Prior to the procedure, a History and Physical                            was performed, and patient medications and                            allergies were reviewed. The patient's tolerance of                            previous anesthesia was also reviewed. The risks                            and benefits of the procedure and the sedation                            options and risks were discussed with the patient.                            All questions were answered, and informed consent                            was obtained. Prior Anticoagulants: The patient has                            taken no previous anticoagulant or antiplatelet                            agents. ASA Grade Assessment: II - A patient with                            mild systemic disease. After reviewing the risks  and benefits, the patient was deemed in                            satisfactory condition to undergo the procedure.                           After obtaining informed consent, the colonoscope                            was passed under direct vision. Throughout the                            procedure, the patient's blood pressure, pulse, and                             oxygen saturations were monitored continuously. The                            CF-HQ190L ([id number]) Olympus colonoscope was                            introduced through the anus and advanced to the 5                            cm into the ileum. The colonoscopy was performed                            without difficulty. The patient tolerated the                            procedure. The quality of the bowel preparation was                            adequate. The terminal ileum, ileocecal valve,                            appendiceal orifice, and rectum were photographed. Scope In: 7:52:13 AM Scope Out: 8:83:25 AM Scope Withdrawal Time: 0 hours 49 minutes 58 seconds  Total Procedure Duration: 0 hours 53 minutes 59 seconds  Findings:      The digital rectal exam findings include hemorrhoids. Pertinent       negatives include no palpable rectal lesions.      The terminal ileum and ileocecal valve appeared normal.      A 30 mm polyp was found in the mid transverse colon. The polyp was       semi-sessile. Preparations were made for mucosal resection. [facility]       and White-light endoscopy was done to mark the borders of the lesion.       Orise gel was injected to raise the lesion. Snare mucosal resection was       performed. Initially it was felt the entire lesion was within the snare       but after initial resection there was still polyp tissue present. A       second snare resection completed the removal. Resection and retrieval  were complete. Coagulation for tissue destruction using snare tip with       soft coagulation to the tissue edge was successful. To prevent bleeding       after mucosal resection, four hemostatic clips were successfully placed       (MR conditional). There was no bleeding at the end of the procedure.      A 18 mm polyp was found in the mid transverse colon. The polyp was       semi-sessile. Preparations were made for mucosal resection.  NBI Imaging       and White-light endoscopy was done to mark the borders of the lesion.       Orise gel was injected to raise the lesion. Snare mucosal resection was       performed. Resection and retrieval were complete. Coagulation for tissue       destruction using snare tip with soft coagulation to the tissue edge was       successful. To prevent bleeding after mucosal resection, two hemostatic       clips were successfully placed (MR conditional). There was no bleeding       at the end of the procedure.      Three sessile polyps were found in the sigmoid colon (1) and transverse       colon (2). The polyps were 2 to 3 mm in size. These polyps were removed       with a cold snare. Resection and retrieval were complete.      Normal mucosa was found in the entire colon otherwise.      Non-bleeding non-thrombosed external and internal hemorrhoids were found       during retroflexion, during perianal exam and during digital exam. The       hemorrhoids were Grade II (internal hemorrhoids that prolapse but reduce       spontaneously). Impression:               - Hemorrhoids found on digital rectal exam.                           - The examined portion of the ileum was normal.                           - One 30 mm polyp in the mid transverse colon,                            removed with piecemeal mucosal resection. Resected                            and retrieved. Treated with a hot snare. Clips (MR                            conditional) were placed.                           - One 18 mm polyp in the mid transverse colon,                            removed with mucosal resection. Resected and  retrieved. Treated with a hot snare. Clips (MR                            conditional) were placed.                           - Three 2 to 3 mm polyps in the sigmoid colon and                            in the transverse colon, removed with a cold snare.                             Resected and retrieved.                           - Normal mucosa in the entire examined colon                            otherwise.                           - Non-bleeding non-thrombosed external and internal                            hemorrhoids. Recommendation:           - The patient will be observed post-procedure,                            until all discharge criteria are met.                           - Discharge patient to home.                           - Patient has a contact number available for                            emergencies. The signs and symptoms of potential                            delayed complications were discussed with the                            patient. Return to normal activities tomorrow.                            Written discharge instructions were provided to the                            patient.                           - High fiber diet.                           -  Await pathology results.                           - Continue present medications.                           - Minimize NSAID use x 2-3 weeks to decrease risk                            of bleeding post-intervention.                           - Await pathology results.                           - Repeat colonoscopy in 6-9 months for surveillance                            to evaluate for recurrence.                           - Recommend 36-48 hours without heavy lifting                            requirement at work, but can return on                            Wednesday/Thursday with these conditions.                           - The findings and recommendations were discussed                            with the patient.                           - The findings and recommendations were discussed                            with the patient's family. Procedure Code(s):        --- Professional ---                           [phone], Colonoscopy, flexible; with endoscopic                             mucosal resection                           45385, 61, Colonoscopy, flexible; with removal of                            tumor(s), polyp(s), or other lesion(s) by snare                            technique Diagnosis Code(s):        --- Professional ---  K64.1, Second degree hemorrhoids                           K63.5, Polyp of colon CPT copyright 2019 [employer]. All rights reserved. The codes documented in this report are preliminary and upon coder review may  be revised to meet current compliance requirements. Justice Britain, MD 05/08/2020 9:15:26 AM Number of Addenda: 0

## 2020-05-08 NOTE — Anesthesia Preprocedure Evaluation (Signed)
Anesthesia Evaluation  Patient identified by MRN, date of birth, ID band Patient awake    Reviewed: Allergy & Precautions, H&P , NPO status , Patient's Chart, lab work & pertinent test results  Airway Mallampati: II   Neck ROM: full    Dental   Pulmonary asthma , sleep apnea , COPD,    breath sounds clear to auscultation       Cardiovascular negative cardio ROS   Rhythm:regular Rate:Normal     Neuro/Psych PSYCHIATRIC DISORDERS Anxiety    GI/Hepatic   Endo/Other  Morbid obesity  Renal/GU stones     Musculoskeletal  (+) Arthritis ,   Abdominal   Peds  Hematology   Anesthesia Other Findings   Reproductive/Obstetrics                             Anesthesia Physical Anesthesia Plan  ASA: II  Anesthesia Plan: MAC   Post-op Pain Management:    Induction: Intravenous  PONV Risk Score and Plan: 1 and Propofol infusion and Treatment may vary due to age or medical condition  Airway Management Planned: Simple Face Mask  Additional Equipment:   Intra-op Plan:   Post-operative Plan:   Informed Consent: I have reviewed the patients History and Physical, chart, labs and discussed the procedure including the risks, benefits and alternatives for the proposed anesthesia with the patient or authorized representative who has indicated his/her understanding and acceptance.       Plan Discussed with: CRNA, Anesthesiologist and Surgeon  Anesthesia Plan Comments:         Anesthesia Quick Evaluation

## 2020-05-09 ENCOUNTER — Encounter: Payer: Self-pay | Admitting: Gastroenterology

## 2020-05-09 LAB — SURGICAL PATHOLOGY

## 2020-05-09 NOTE — Anesthesia Postprocedure Evaluation (Signed)
Anesthesia Post Note  Patient: [name] [name]  Procedure(s) Performed: COLONOSCOPY WITH PROPOFOL (N/A ) ENDOSCOPIC MUCOSAL RESECTION (N/A ) SUBMUCOSAL LIFTING INJECTION POLYPECTOMY FOREIGN BODY REMOVAL HEMOSTASIS CLIP PLACEMENT     Patient location during evaluation: Endoscopy Anesthesia Type: MAC Level of consciousness: awake and alert Pain management: pain level controlled Vital Signs Assessment: post-procedure vital signs reviewed and stable Respiratory status: spontaneous breathing, nonlabored ventilation, respiratory function stable and patient connected to nasal cannula oxygen Cardiovascular status: blood pressure returned to baseline and stable Postop Assessment: no apparent nausea or vomiting Anesthetic complications: no   No complications documented.  Last Vitals:  Vitals:   05/08/20 0905 05/08/20 0925  BP: (!) 128/95 (!) 146/98  Pulse: 75 79  Resp: 13 14  Temp:    SpO2: 100% 100%    Last Pain:  Vitals:   05/08/20 0905  TempSrc:   PainSc: 0-No pain                 Macarius Ruark S

## 2020-05-10 ENCOUNTER — Encounter (HOSPITAL_COMMUNITY): Payer: Self-pay | Admitting: Gastroenterology

## 2020-05-11 ENCOUNTER — Other Ambulatory Visit: Payer: Self-pay | Admitting: Internal Medicine

## 2020-05-11 NOTE — Telephone Encounter (Signed)
Montelukast (singulair) refilled

## 2020-05-11 NOTE — Telephone Encounter (Signed)
Pt is requesting refill has f/u 06/12/20 MONTELUKAST SOD 10 MG TABLET

## 2020-06-04 NOTE — Progress Notes (Signed)
Patient ID: TRES [id number], male    DOB: January 22, 1966, 55 y.o.   MRN: [id number]  HPI male never smoker followed for chronic obstructive asthma, OSA/quit CPAP, rhinitis/chronic sinusitis Unattended Home Sleep Test 02/09/15- mild OSA/ AHI 9.3/ hr, desat to 85%, weight 276 lbs PFT 03/06/2007 PFT 01/29/2011 Office Spirometry 02/03/2014-severe obstructive airways disease with low FVC.  FVC 3.13/62%, FEV1 2.01/50%, ratio 0.64,  FEF 25-75% 0.87/22% CT sinus 11/23/2009-moderate chronic appearing pansinusitis. Surgery 09/24/2010-polypoid chronic sinusitis CBC with differential 12/28/2015-eosinophils 7.3% CBC w diff 09/28/2018- steroid pattern EOS 0.2 k/ul FENO 10/02/16- 33 H Office spirometry 10/02/16-moderate restriction and obstruction. 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 He says dermatologist suggested rash was "nerves". [name] is helping breathing with much less wheeze and dyspnea. He is learning to self-inject. Clonazepam helps nerves and sleep. Allergy Skin Testing 09/21/19- Dr Verlin Fester- + dust mites CT chest 10/13/19-  IMPRESSION: No acute intrathoracic pathology.  No pulmonary nodule.  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 After that he usually feels clear rest of day. Still doing much better "90%" than he used to  . Not seasonal. Does ok giving own Fasenra injection. prednisone don to 5 mg daily.  He is satisfied with current status.  Flonase may have increased eye pressure- being watched.   Review of Systems-See HPI       + = positive Constitutional:    No- night sweats ,fevers, chills, fatigue, lassitude. HEENT:   No - headaches,  Difficulty swallowing, Tooth/dental problems, Sore throat,                No sneezing, itching, ear ache, CV:  No chest pain,  Orthopnea, PND, swelling in lower extremities, anasarca, dizziness, palpitations GI  No heartburn, indigestion, abdominal pain, nausea, vomiting,  Resp:.See HPI    +productive cough   No coughing up of blood.  +change in color of mucus,  + wheeze Skin: + HPI GU: . MS:  No joint pain or swelling.  [name] Psych:   change in mood or affect.  depression and  anxiety.  No memory loss.   Objective:   Physical Exam General- Alert, Oriented, Affect-appropriate, Distress- none acute, + Obese / thick neck Skin- + birthmark R hand Lymphadenopathy- none Head- atraumatic            Eyes- Gross vision intact, PERRLA, conjunctivae clear secretions            Ears- Hearing, canals normal            Nose- Clear, no-Septal dev, mucus, polyps, erosion, perforation             Throat- Mallampati  III- IV , mucosa+ geographic, drainage- none, tonsils- atrophic. , Neck- flexible , trachea midline, no stridor , thyroid nl,  carotid no bruit Chest - symmetrical excursion , unlabored           Heart/CV- RRR , no murmur , no gallop  , no rub, nl s1 s2                           - JVD- none , edema- none, stasis changes- none, varices- none           Lung-  Wheeze-none, clear, unlabored,   cough  -None , dullness-none, rub- none           Chest wall-  Abd-  Br/ Gen/ Rectal- Not done, not indicated Extrem- cyanosis- none, clubbing, none, atrophy- none, strength- nl Neuro- grossly intact to observation

## 2020-06-05 ENCOUNTER — Encounter: Payer: Self-pay | Admitting: Internal Medicine

## 2020-06-05 ENCOUNTER — Other Ambulatory Visit: Payer: Self-pay

## 2020-06-05 ENCOUNTER — Ambulatory Visit (INDEPENDENT_AMBULATORY_CARE_PROVIDER_SITE_OTHER): Payer: 59 | Admitting: Internal Medicine

## 2020-06-05 DIAGNOSIS — J449 Chronic obstructive pulmonary disease, unspecified: Secondary | ICD-10-CM

## 2020-06-05 DIAGNOSIS — J3089 Other allergic rhinitis: Secondary | ICD-10-CM | POA: Diagnosis not present

## 2020-06-05 MED ORDER — ALBUTEROL SULFATE HFA 108 (90 BASE) MCG/ACT IN AERS
INHALATION_SPRAY | RESPIRATORY_TRACT | 12 refills | Status: DC
Start: 2020-06-05 — End: 2021-06-11

## 2020-06-05 MED ORDER — FLUTICASONE PROPIONATE 50 MCG/ACT NA SUSP
NASAL | 3 refills | Status: DC
Start: 2020-06-05 — End: 2021-06-11

## 2020-06-05 MED ORDER — PREDNISONE 5 MG PO TABS
5.0000 mg | ORAL_TABLET | Freq: Every day | ORAL | 1 refills | Status: DC
Start: 2020-06-05 — End: 2020-11-13

## 2020-06-05 MED ORDER — BUDESONIDE-FORMOTEROL FUMARATE 160-4.5 MCG/ACT IN AERO
INHALATION_SPRAY | RESPIRATORY_TRACT | 12 refills | Status: DC
Start: 2020-06-05 — End: 2020-12-09

## 2020-06-05 MED ORDER — CLORAZEPATE DIPOTASSIUM 7.5 MG PO TABS
7.5000 mg | ORAL_TABLET | Freq: Two times a day (BID) | ORAL | 5 refills | Status: DC | PRN
Start: 2020-06-05 — End: 2020-12-14

## 2020-06-05 MED ORDER — MONTELUKAST SODIUM 10 MG PO TABS
10.0000 mg | ORAL_TABLET | Freq: Every day | ORAL | 4 refills | Status: DC
Start: 2020-06-05 — End: 2020-11-16

## 2020-06-05 MED ORDER — CLONAZEPAM 1 MG PO TABS
ORAL_TABLET | ORAL | 5 refills | Status: DC
Start: 2020-06-05 — End: 2020-07-07

## 2020-06-05 MED ORDER — IPRATROPIUM-ALBUTEROL 0.5-2.5 (3) MG/3ML IN SOLN
RESPIRATORY_TRACT | 1 refills | Status: DC
Start: 2020-06-05 — End: 2021-01-04

## 2020-06-05 NOTE — Patient Instructions (Signed)
We agreed to continue Fasenra at this time.  Med refills done

## 2020-06-13 ENCOUNTER — Encounter: Payer: Self-pay | Admitting: Family

## 2020-07-05 ENCOUNTER — Encounter: Payer: Self-pay | Admitting: Internal Medicine

## 2020-07-05 NOTE — Assessment & Plan Note (Signed)
He and his eye-doctor will monitor glaucoma and risk of nasal steroid use as an aggravating factor.

## 2020-07-05 NOTE — Assessment & Plan Note (Addendum)
Bronchitic component a little more active.  Plan- we are choosing to continue current meds and watch for now. Discussed use of prednisone. If cough remains productive, consider culture.

## 2020-07-07 ENCOUNTER — Ambulatory Visit (INDEPENDENT_AMBULATORY_CARE_PROVIDER_SITE_OTHER): Payer: 59 | Admitting: Family

## 2020-07-07 ENCOUNTER — Other Ambulatory Visit: Payer: Self-pay

## 2020-07-07 ENCOUNTER — Encounter: Payer: Self-pay | Admitting: Family

## 2020-07-07 ENCOUNTER — Other Ambulatory Visit: Payer: Self-pay | Admitting: Internal Medicine

## 2020-07-07 VITALS — BP 160/86 | HR 92 | Temp 98.6°F | Ht 70.0 in | Wt 285.0 lb

## 2020-07-07 DIAGNOSIS — Z125 Encounter for screening for malignant neoplasm of prostate: Secondary | ICD-10-CM | POA: Diagnosis not present

## 2020-07-07 DIAGNOSIS — R030 Elevated blood-pressure reading, without diagnosis of hypertension: Secondary | ICD-10-CM

## 2020-07-07 DIAGNOSIS — Z0000 Encounter for general adult medical examination without abnormal findings: Secondary | ICD-10-CM | POA: Diagnosis not present

## 2020-07-07 DIAGNOSIS — Z13220 Encounter for screening for lipoid disorders: Secondary | ICD-10-CM | POA: Diagnosis not present

## 2020-07-07 LAB — CBC WITH DIFFERENTIAL/PLATELET
Basophils Absolute: 0 10*3/uL (ref 0.0–0.1)
Basophils Relative: 0.1 % (ref 0.0–3.0)
Eosinophils Absolute: 0 10*3/uL (ref 0.0–0.7)
Eosinophils Relative: 0 % (ref 0.0–5.0)
HCT: 46 % (ref 39.0–52.0)
Hemoglobin: 15.2 g/dL (ref 13.0–17.0)
Lymphocytes Relative: 14.7 % (ref 12.0–46.0)
Lymphs Abs: 1.2 10*3/uL (ref 0.7–4.0)
MCHC: 33 g/dL (ref 30.0–36.0)
MCV: 88.7 fl (ref 78.0–100.0)
Monocytes Absolute: 0.9 10*3/uL (ref 0.1–1.0)
Monocytes Relative: 10.8 % (ref 3.0–12.0)
Neutro Abs: 6.1 10*3/uL (ref 1.4–7.7)
Neutrophils Relative %: 74.4 % (ref 43.0–77.0)
Platelets: 224 10*3/uL (ref 150.0–400.0)
RBC: 5.19 Mil/uL (ref 4.22–5.81)
RDW: 14.6 % (ref 11.5–15.5)
WBC: 8.3 10*3/uL (ref 4.0–10.5)

## 2020-07-07 LAB — COMPREHENSIVE METABOLIC PANEL
ALT: 26 U/L (ref 0–53)
AST: 19 U/L (ref 0–37)
Albumin: 4.3 g/dL (ref 3.5–5.2)
Alkaline Phosphatase: 74 U/L (ref 39–117)
BUN: 26 mg/dL — ABNORMAL HIGH (ref 6–23)
CO2: 29 mEq/L (ref 19–32)
Calcium: 9.1 mg/dL (ref 8.4–10.5)
Chloride: 103 mEq/L (ref 96–112)
Creatinine, Ser: 1.25 mg/dL (ref 0.40–1.50)
GFR: 65.35 mL/min (ref >60.00)
Glucose, Bld: 106 mg/dL — ABNORMAL HIGH (ref 70–99)
Potassium: 4.3 mEq/L (ref 3.5–5.1)
Sodium: 140 mEq/L (ref 135–145)
Total Bilirubin: 0.5 mg/dL (ref 0.2–1.2)
Total Protein: 7.2 g/dL (ref 6.0–8.3)

## 2020-07-07 LAB — LIPID PANEL
Cholesterol: 162 mg/dL (ref 0–200)
HDL: 46.2 mg/dL (ref >39.00)
LDL Cholesterol: 97 mg/dL (ref 0–99)
NonHDL: 115.71
Total CHOL/HDL Ratio: 4
Triglycerides: 94 mg/dL (ref 0.0–149.0)
VLDL: 18.8 mg/dL (ref 0.0–40.0)

## 2020-07-07 LAB — PSA: PSA: 2.43 ng/mL (ref 0.10–4.00)

## 2020-07-07 NOTE — Telephone Encounter (Signed)
Dr. Annamaria Boots, please advise if you are okay refilling med.  No Known Allergies   Current Outpatient Medications:  .  albuterol (VENTOLIN HFA) 108 (90 Base) MCG/ACT inhaler, INHALE 2 PUFFS INTO THE LUNGS EVER2Y 6 HOURS AS NEEDED FOR WHEEZING OR FOR SHORTNESS OF BREATH, Disp: 18 g, Rfl: 12 .  Benralizumab (FASENRA PEN) 30 MG/ML SOAJ, Inject 30 mg into the skin every 8 (eight) weeks., Disp: 1 pen, Rfl: 6 .  budesonide-formoterol (SYMBICORT) 160-4.5 MCG/ACT inhaler, INHALE 2 PUFFS INTO THE LUNGS TWICE A DAY RINSE MOUTH AFTER USE, Disp: 10.2 g, Rfl: 12 .  clonazePAM (KLONOPIN) 1 MG tablet, 1 or 2 before bed for sleep, Disp: 30 tablet, Rfl: 5 .  clorazepate (TRANXENE) 7.5 MG tablet, Take 1 tablet (7.5 mg total) by mouth 2 (two) times daily as needed., Disp: 60 tablet, Rfl: 5 .  fexofenadine (ALLEGRA) 180 MG tablet, Take 360 mg by mouth in the morning and at bedtime., Disp: , Rfl:  .  fluticasone (FLONASE) 50 MCG/ACT nasal spray, SPRAY 2 SPRAYS INTO EACH NOSTRIL EVERY DAY, Disp: 54 mL, Rfl: 3 .  ipratropium-albuterol (DUONEB) 0.5-2.5 (3) MG/3ML SOLN, INHALE 1 VIAL VIA NEBULIZER 4 TIMES A DAY AS NEEDED, Disp: 1080 mL, Rfl: 1 .  montelukast (SINGULAIR) 10 MG tablet, Take 1 tablet (10 mg total) by mouth daily., Disp: 90 tablet, Rfl: 4 .  Nebulizers (COMPRESSOR NEBULIZER) MISC, 1 Device by Does not apply route 4 (four) times daily as needed., Disp: 1 each, Rfl: 0 .  predniSONE (DELTASONE) 5 MG tablet, Take 1 tablet (5 mg total) by mouth daily., Disp: 90 tablet, Rfl: 1 .  Spacer/[name]-Holding Chambers (AEROCHAMBER MV) inhaler, Use as instructed, Disp: 1 each, Rfl: 0

## 2020-07-07 NOTE — Patient Instructions (Addendum)
Please start checking your blood pressure regularly- please consider purchasing a home blood pressure cuff;  OMRON arm cuff;   If your blood pressure is consistently above 140/90, please schedule a follow up;    DASH Eating Plan DASH stands for "Dietary Approaches to Stop Hypertension." The DASH eating plan is a healthy eating plan that has been shown to reduce high blood pressure (hypertension). It may also reduce your risk for type 2 diabetes, heart disease, and stroke. The DASH eating plan may also help with weight loss. What are tips for following this plan?  General guidelines  Avoid eating more than 2,300 mg (milligrams) of salt (sodium) a day. If you have hypertension, you may need to reduce your sodium intake to 1,500 mg a day.  Limit alcohol intake to no more than 1 drink a day for nonpregnant women and 2 drinks a day for men. One drink equals 12 oz of beer, 5 oz of wine, or 1 oz of hard liquor.  Work with your health care provider to maintain a healthy body weight or to lose weight. Ask what an ideal weight is for you.  Get at least 30 minutes of exercise that causes your heart to beat faster (aerobic exercise) most days of the week. Activities may include walking, swimming, or biking.  Work with your health care provider or diet and nutrition specialist (dietitian) to adjust your eating plan to your individual calorie needs. Reading food labels   Check food labels for the amount of sodium per serving. Choose foods with less than 5 percent of the Daily Value of sodium. Generally, foods with less than 300 mg of sodium per serving fit into this eating plan.  To find whole grains, look for the word "whole" as the first word in the ingredient list. Shopping  Buy products labeled as "low-sodium" or "no salt added."  Buy fresh foods. Avoid canned foods and premade or frozen meals. Cooking  Avoid adding salt when cooking. Use salt-free seasonings or herbs instead of table salt  or sea salt. Check with your health care provider or pharmacist before using salt substitutes.  Do not fry foods. Cook foods using healthy methods such as baking, boiling, grilling, and broiling instead.  Cook with heart-healthy oils, such as olive, canola, soybean, or sunflower oil. Meal planning  Eat a balanced diet that includes: ? 5 or more servings of fruits and vegetables each day. At each meal, try to fill half of your plate with fruits and vegetables. ? Up to 6-8 servings of whole grains each day. ? Less than 6 oz of lean meat, poultry, or fish each day. A 3-oz serving of meat is about the same size as a deck of cards. One egg equals 1 oz. ? 2 servings of low-fat dairy each day. ? A serving of nuts, seeds, or beans 5 times each week. ? Heart-healthy fats. Healthy fats called Omega-3 fatty acids are found in foods such as flaxseeds and coldwater fish, like sardines, salmon, and mackerel.  Limit how much you eat of the following: ? Canned or prepackaged foods. ? Food that is high in trans fat, such as fried foods. ? Food that is high in saturated fat, such as fatty meat. ? Sweets, desserts, sugary drinks, and other foods with added sugar. ? Full-fat dairy products.  Do not salt foods before eating.  Try to eat at least 2 vegetarian meals each week.  Eat more home-cooked food and less restaurant, buffet, and fast food.  When eating at a restaurant, ask that your food be prepared with less salt or no salt, if possible. What foods are recommended? The items listed may not be a complete list. Talk with your dietitian about what dietary choices are best for you. Grains Whole-grain or whole-wheat bread. Whole-grain or whole-wheat pasta. Brown rice. [place]. Bulgur. Whole-grain and low-sodium cereals. Pita bread. Low-fat, low-sodium crackers. Whole-wheat flour tortillas. Vegetables Fresh or frozen vegetables (raw, steamed, roasted, or grilled). Low-sodium or reduced-sodium  tomato and vegetable juice. Low-sodium or reduced-sodium tomato sauce and tomato paste. Low-sodium or reduced-sodium canned vegetables. Fruits All fresh, dried, or frozen fruit. Canned fruit in natural juice (without added sugar). Meat and other protein foods Skinless chicken or [place]. Ground chicken or [place]. Pork with fat trimmed off. Fish and seafood. Egg whites. Dried beans, peas, or lentils. Unsalted nuts, nut butters, and seeds. Unsalted canned beans. Lean cuts of beef with fat trimmed off. Low-sodium, lean deli meat. Dairy Low-fat (1%) or fat-free (skim) milk. Fat-free, low-fat, or reduced-fat cheeses. Nonfat, low-sodium ricotta or cottage cheese. Low-fat or nonfat yogurt. Low-fat, low-sodium cheese. Fats and oils Soft margarine without trans fats. Vegetable oil. Low-fat, reduced-fat, or light mayonnaise and salad dressings (reduced-sodium). Canola, safflower, olive, soybean, and sunflower oils. Avocado. Seasoning and other foods Herbs. Spices. Seasoning mixes without salt. Unsalted popcorn and pretzels. Fat-free sweets. What foods are not recommended? The items listed may not be a complete list. Talk with your dietitian about what dietary choices are best for you. Grains Baked goods made with fat, such as croissants, muffins, or some breads. Dry pasta or rice meal packs. Vegetables Creamed or fried vegetables. Vegetables in a cheese sauce. Regular canned vegetables (not low-sodium or reduced-sodium). Regular canned tomato sauce and paste (not low-sodium or reduced-sodium). Regular tomato and vegetable juice (not low-sodium or reduced-sodium). Angie Fava. Olives. Fruits Canned fruit in a light or heavy syrup. Fried fruit. Fruit in cream or butter sauce. Meat and other protein foods Fatty cuts of meat. Ribs. Fried meat. Berniece Salines. Sausage. Bologna and other processed lunch meats. Salami. Fatback. Hotdogs. Bratwurst. Salted nuts and seeds. Canned beans with added salt. Canned or smoked fish.  Whole eggs or egg yolks. Chicken or [place] with skin. Dairy Whole or 2% milk, cream, and half-and-half. Whole or full-fat cream cheese. Whole-fat or sweetened yogurt. Full-fat cheese. Nondairy creamers. Whipped toppings. Processed cheese and cheese spreads. Fats and oils Butter. Stick margarine. Lard. Shortening. Ghee. Bacon fat. Tropical oils, such as coconut, palm kernel, or palm oil. Seasoning and other foods Salted popcorn and pretzels. Onion salt, garlic salt, seasoned salt, table salt, and sea salt. Worcestershire sauce. Tartar sauce. Barbecue sauce. Teriyaki sauce. Soy sauce, including reduced-sodium. Steak sauce. Canned and packaged gravies. Fish sauce. Oyster sauce. Cocktail sauce. Horseradish that you find on the shelf. Ketchup. Mustard. Meat flavorings and tenderizers. Bouillon cubes. Hot sauce and Tabasco sauce. Premade or packaged marinades. Premade or packaged taco seasonings. Relishes. Regular salad dressings. Where to find more information:  National Heart, Lung, and [place]: https://wilson-eaton.com/  American Heart Association: www.heart.org Summary  The DASH eating plan is a healthy eating plan that has been shown to reduce high blood pressure (hypertension). It may also reduce your risk for type 2 diabetes, heart disease, and stroke.  With the DASH eating plan, you should limit salt (sodium) intake to 2,300 mg a day. If you have hypertension, you may need to reduce your sodium intake to 1,500 mg a day.  When on the DASH eating plan,  aim to eat more fresh fruits and vegetables, whole grains, lean proteins, low-fat dairy, and heart-healthy fats.  Work with your health care provider or diet and nutrition specialist (dietitian) to adjust your eating plan to your individual calorie needs. This information is not intended to replace advice given to you by your health care provider. Make sure you discuss any questions you have with your health care provider. Document Revised:  06/20/2017 Document Reviewed: 07/01/2016 Elsevier Patient Education  2020 [employer].

## 2020-07-07 NOTE — Telephone Encounter (Signed)
Clonazepam refilled 

## 2020-07-07 NOTE — Progress Notes (Signed)
[name] [name] is a 54 y.o. male with the following history as recorded in EpicCare:  Patient Active Problem List   Diagnosis Date Noted  . History of colon polyps 03/01/2020  . Abnormal colonoscopy 03/01/2020  . Recurrent urticaria/pruritus 09/21/2019  . History of nasal polyposis 09/21/2019  . Contact dermatitis 07/01/2019  . Acute kidney injury ([place]) 08/08/2015  . Hyperkalemia 08/08/2015  . Acute pyelonephritis   . Left ureteral stone 08/07/2015  . Pyonephrosis 08/07/2015  . Acute renal insufficiency 08/07/2015  . Nonallopathic lesion of thoracic region 03/15/2015  . Lateral epicondylitis of right elbow 02/22/2015  . Obstructive sleep apnea 01/28/2015  . Triceps tendinitis 01/11/2015  . Back pain 12/21/2014  . Anxiety state 02/06/2014  . Thrush of mouth and esophagus ([place]) 06/20/2011  . Sinusitis, maxillary, chronic 11/18/2010  . ALLERGIC CONJUNCTIVITIS 08/25/2007  . Perennial allergic rhinitis 08/25/2007  . 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 albuterol (VENTOLIN HFA) 108 (90 Base) MCG/ACT inhaler INHALE 2 PUFFS INTO THE LUNGS EVER2Y 6 HOURS AS NEEDED FOR WHEEZING OR FOR SHORTNESS OF BREATH 18 g 12  . Benralizumab (FASENRA PEN) 30 MG/ML SOAJ Inject 30 mg into the skin every 8 (eight) weeks. 1 pen 6  . budesonide-formoterol (SYMBICORT) 160-4.5 MCG/ACT inhaler INHALE 2 PUFFS INTO THE LUNGS TWICE A DAY RINSE MOUTH AFTER USE 10.2 g 12  . clonazePAM (KLONOPIN) 1 MG tablet 1 or 2 before bed for sleep 30 tablet 5  . clorazepate (TRANXENE) 7.5 MG tablet Take 1 tablet (7.5 mg total) by mouth 2 (two) times daily as needed. 60 tablet 5  . fexofenadine (ALLEGRA) 180 MG tablet Take 360 mg by mouth in the morning and at bedtime.    . fluticasone (FLONASE) 50 MCG/ACT nasal spray SPRAY 2 SPRAYS INTO EACH NOSTRIL EVERY DAY 54 mL 3  . ipratropium-albuterol (DUONEB) 0.5-2.5 (3)  MG/3ML SOLN INHALE 1 VIAL VIA NEBULIZER 4 TIMES A DAY AS NEEDED 1080 mL 1  . montelukast (SINGULAIR) 10 MG tablet Take 1 tablet (10 mg total) by mouth daily. 90 tablet 4  . Nebulizers (COMPRESSOR NEBULIZER) MISC 1 Device by Does not apply route 4 (four) times daily as needed. 1 each 0  . predniSONE (DELTASONE) 5 MG tablet Take 1 tablet (5 mg total) by mouth daily. 90 tablet 1  . Spacer/[name]-Holding Chambers (AEROCHAMBER MV) inhaler Use as instructed 1 each 0   No current facility-administered medications for this visit.    Allergies: Patient has no known allergies.  Past Medical History:  Diagnosis Date  . Allergic rhinitis   . Allergy    seasonal allergies  . Angio-edema   . Cancer ([place])    basal cell-nose  . Complication of anesthesia    difficulty waking up from lithotripsy and nasal surgery  . Glaucoma    has occasional pressure issues in eyes  . History of chronic bronchitis   . History of kidney stones   . Left ureteral stone   . Mild obstructive sleep apnea    per study 02-09-2015  recommended oral appliance / cpap-- pt tried intolerant  . Moderate persistent asthma    pulmologist-  dr young  . Pneumonia 1994  . Urticaria     Past Surgical History:  Procedure Laterality Date  . COLONOSCOPY  12/07/2019   polyps  . COLONOSCOPY WITH PROPOFOL N/A 05/08/2020   Procedure: COLONOSCOPY WITH PROPOFOL;  Surgeon: Rush Landmark Telford Nab., MD;  Location: [facility];  Service: Gastroenterology;  Laterality: N/A;  . CYSTO/  LEFT URETERAL STENT PLACEMENT  03-10-2002  . CYSTOSCOPY W/ URETERAL STENT PLACEMENT Left 08/07/2015   Procedure: CYSTOSCOPY, Left Retrograde Pyelogram, Left Ureteral Stent Placement;  Surgeon: Irine Seal, MD;  Location: [facility];  Service: Urology;  Laterality: Left;  . CYSTOSCOPY/URETEROSCOPY/HOLMIUM LASER/STENT PLACEMENT Left 08/17/2015   Procedure: CYSTOSCOPY / LEFT URETEROSCOPY / HOLMIUM LASER LITHOTRIPSY;  Surgeon: Irine Seal, MD;  Location[facility];  Service: Urology;  Laterality: Left;  . ENDOSCOPIC MUCOSAL RESECTION N/A 05/08/2020   Procedure: ENDOSCOPIC MUCOSAL RESECTION;  Surgeon: Rush Landmark Telford Nab., MD;  Location: [place];  Service: Gastroenterology;  Laterality: N/A;  . EXTRACORPOREAL SHOCK WAVE LITHOTRIPSY  2006  . HEMOSTASIS CLIP PLACEMENT  05/08/2020   Procedure: HEMOSTASIS CLIP PLACEMENT;  Surgeon: Irving Copas., MD;  Location: [place];  Service: Gastroenterology;;  . POLYPECTOMY  05/08/2020   Procedure: POLYPECTOMY;  Surgeon: Irving Copas., MD;  Location: [facility];  Service: Gastroenterology;;  . SEPTOPLASTY WITH ETHMOIDECTOMY, AND MAXILLARY ANTROSTOMY  09-24-2010  . SINOSCOPY    . STONE EXTRACTION WITH BASKET Left 08/17/2015   Procedure: STONE EXTRACTION WITH BASKET;  Surgeon: Irine Seal, MD;  Location[facility];  Service: Urology;  Laterality: Left;  . SUBMUCOSAL LIFTING INJECTION  05/08/2020   Procedure: SUBMUCOSAL LIFTING INJECTION;  Surgeon: Rush Landmark Telford Nab., MD;  Location[facility];  Service: Gastroenterology;;  . Arnetha Courser TOOTH EXTRACTION  1994    Family History  Problem Relation Age of Onset  . Asthma Father   . Other Father        bronchitis  . Allergic rhinitis Father   . Diabetes Maternal Grandfather 59       Heavy smoker  . Eczema Neg Hx   . Immunodeficiency Neg Hx   . Urticaria Neg Hx   . Colon polyps Neg Hx   . Colon cancer Neg Hx   . Esophageal cancer Neg Hx   . Rectal cancer Neg Hx   . Stomach cancer Neg Hx   . Inflammatory bowel disease Neg Hx   . Liver disease Neg Hx   . Pancreatic cancer Neg Hx     Social History   Tobacco Use  . Smoking status: Never Smoker  . Smokeless tobacco: Never Used  Substance Use Topics  . Alcohol use: Yes    Comment: socially     Subjective:  Presents for yearly CPE; in baseline state of health today;  Has had colonoscopy done in the past few months; due for 1 year follow- up in 2022;   Has had recurrent hives in the past year- has seen both allergy/ asthma and dermatology in the past year- felt to be stress related;  Had [facility] removed this year- Mohs Surgery completed;   Continuing to see pulmonologist regularly;   Review of Systems  Constitutional: Negative.   HENT: Negative.   Eyes: Negative.   Respiratory: Positive for cough.   Cardiovascular: Negative.   Gastrointestinal: Negative.   Genitourinary: Negative.   Musculoskeletal: Negative.   Skin: Positive for rash.  Neurological: Negative.   Endo/Heme/Allergies: Negative.   Psychiatric/Behavioral: Negative.      Objective:  Vitals:   07/07/20 0957  BP: (!) 160/86  Pulse: 92  Temp: 98.6 F (37 C)  TempSrc: Oral  SpO2: 96%  Weight: 285 lb (129.3 kg)  Height: _0  (1.778 m)    General: Well developed, well nourished, in no acute distress  Skin : Warm and dry.  Head: Normocephalic and atraumatic  Eyes: Sclera and conjunctiva clear; pupils round and reactive to light; extraocular movements intact  Ears: External normal; canals clear; tympanic membranes normal  Oropharynx: Pink, supple. No suspicious lesions  Neck: Supple without thyromegaly, adenopathy  Lungs: Respirations unlabored; clear to auscultation bilaterally without wheeze, rales, rhonchi  CVS exam: normal rate and regular rhythm.  Abdomen: Soft; nontender; nondistended; normoactive bowel sounds; no masses or hepatosplenomegaly  Musculoskeletal: No deformities; no active joint inflammation  Extremities: No edema, cyanosis, clubbing  Vessels: Symmetric bilaterally  Neurologic: Alert and oriented; speech intact; face symmetrical; moves all extremities well; CNII-XII intact without focal deficit   Assessment:  1. PE (physical exam), annual   2. Lipid screening   3. Prostate cancer screening   4. Elevated blood pressure reading     Plan:  Age appropriate preventive healthcare needs addressed; encouraged regular eye doctor and dental exams;  encouraged regular exercise and need for weight loss; will update labs and refills as needed today; follow-up to be determined; Encouraged to start checking his blood pressure regularly- follow up if consistently above 140/90; DASH diet discussed;  This visit occurred during the SARS-CoV-2 public health emergency.  Safety protocols were in place, including screening questions prior to the visit, additional usage of staff PPE, and extensive cleaning of exam room while observing appropriate contact time as indicated for disinfecting solutions.      No follow-ups on file.  Orders Placed This Encounter  Procedures  . CBC with Differential/Platelet    Standing Status:   Future    Number of Occurrences:   1    Standing Expiration Date:   07/07/2021  . Comp Met (CMET)    Standing Status:   Future    Number of Occurrences:   1    Standing Expiration Date:   07/07/2021  . Lipid panel    Standing Status:   Future    Number of Occurrences:   1    Standing Expiration Date:   07/07/2021  . PSA    Standing Status:   Future    Number of Occurrences:   1    Standing Expiration Date:   07/07/2021    Requested Prescriptions    No prescriptions requested or ordered in this encounter

## 2020-07-17 ENCOUNTER — Other Ambulatory Visit: Payer: Self-pay | Admitting: Internal Medicine

## 2020-11-08 ENCOUNTER — Other Ambulatory Visit: Payer: Self-pay | Admitting: Internal Medicine

## 2020-11-12 ENCOUNTER — Other Ambulatory Visit: Payer: Self-pay | Admitting: Internal Medicine

## 2020-11-16 ENCOUNTER — Other Ambulatory Visit: Payer: Self-pay

## 2020-11-16 MED ORDER — MONTELUKAST SODIUM 10 MG PO TABS
10.0000 mg | ORAL_TABLET | Freq: Every day | ORAL | 3 refills | Status: DC
Start: 2020-11-16 — End: 2021-08-20

## 2020-11-30 IMAGING — DX DG CHEST 1V
1 series · 1 of 1 positions shown · non-contrast
Comparison: October 01, 2019.

CLINICAL DATA: Left lung nodule.

EXAM:
CHEST  1 VIEW

[chest pa]
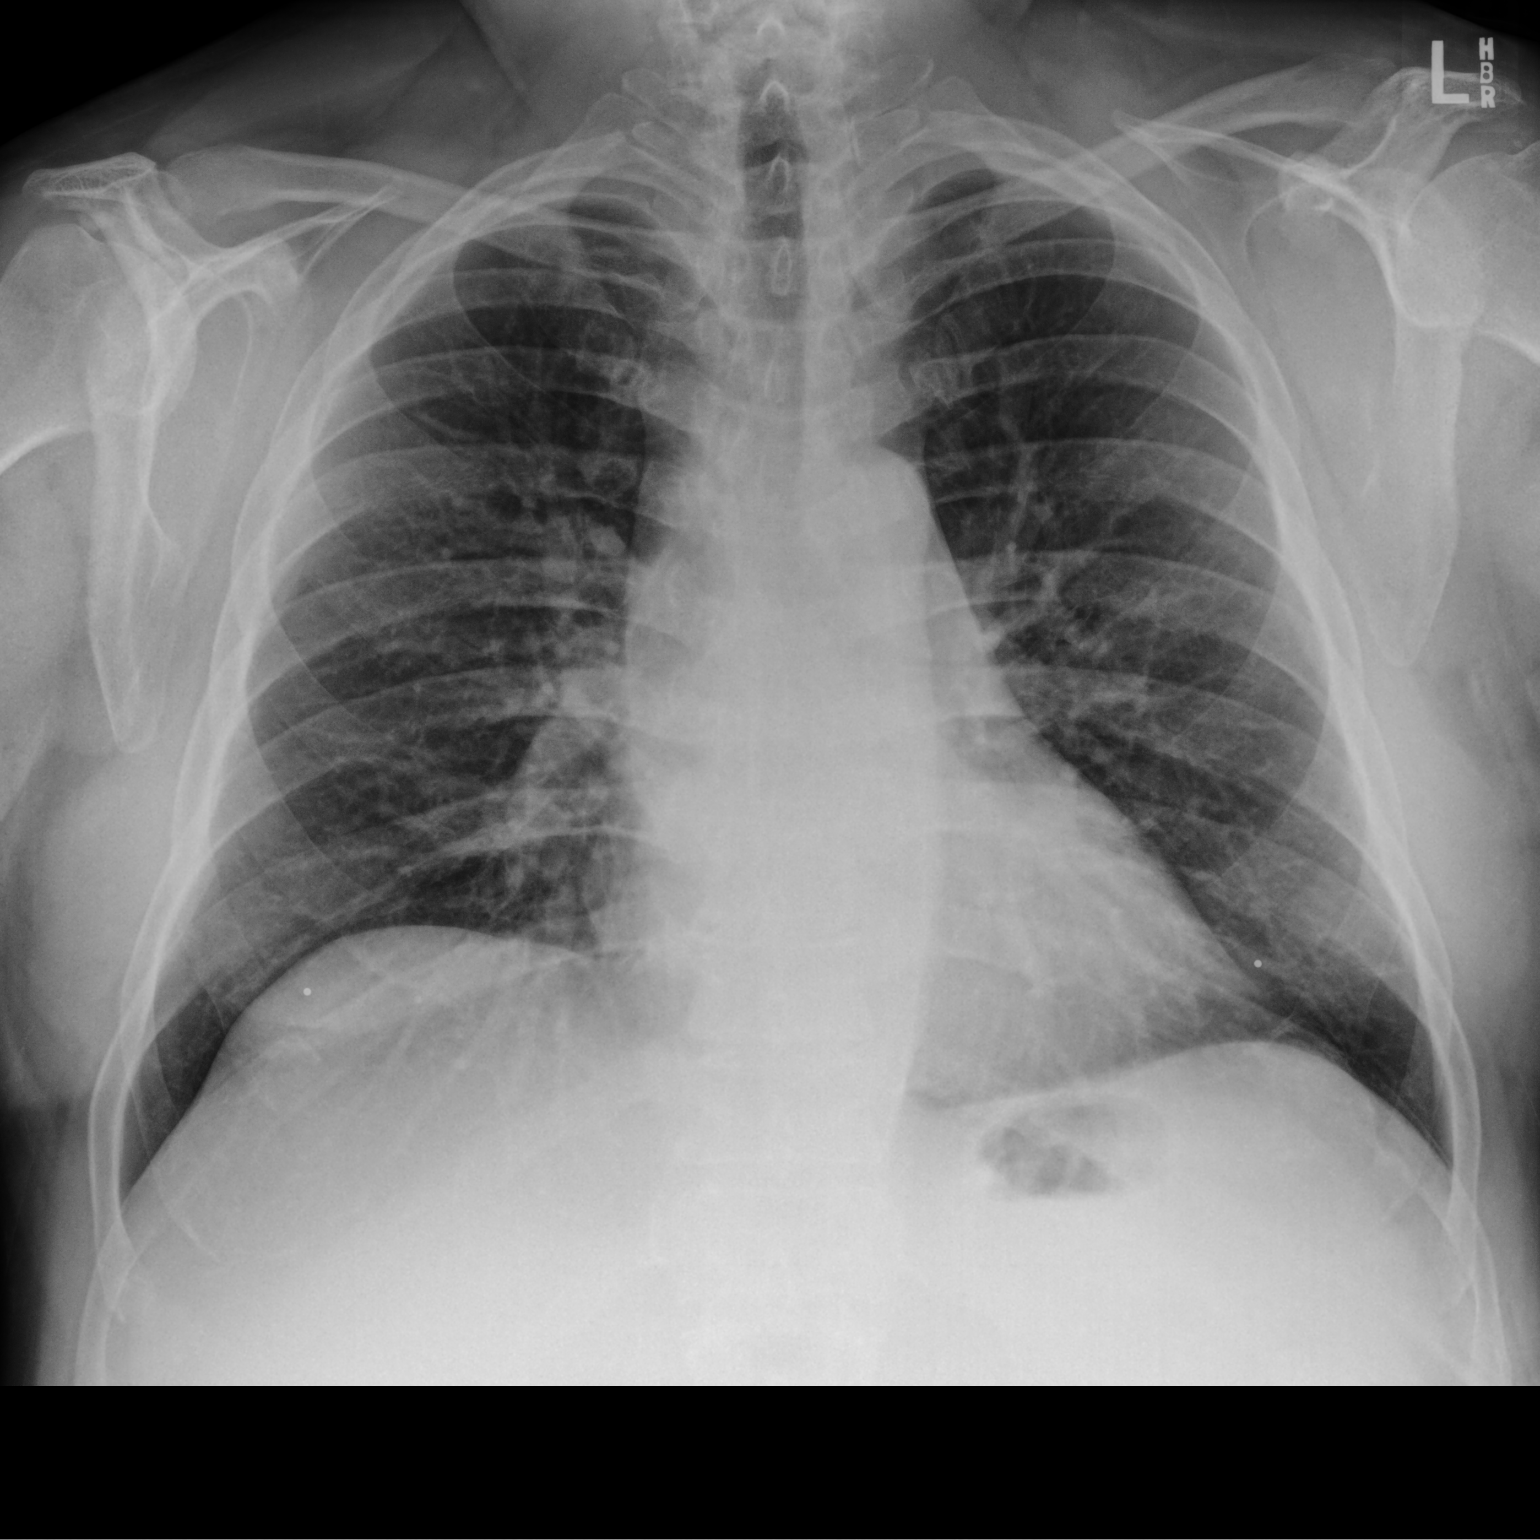

[1 of 1 positions shown; findings below may reference images not displayed]

FINDINGS: The heart size and mediastinal contours are within normal limits. No
pneumothorax or pleural effusion is noted. Right lung is clear.
Overlying nipple marker does not correspond to possible left lower
lobe nodular density. The visualized skeletal structures are
unremarkable.
IMPRESSION: Overlying nipple marker does not correspond to possible left lower
lobe nodular density; CT scan of the chest is recommended evaluate
for pulmonary nodule. These results will be called to the ordering
clinician or representative by the Radiologist Assistant, and
communication documented in the PACS or zVision Dashboard.

## 2020-12-08 IMAGING — CT CT CHEST W/O CM
2 of 4 series · 15 of 36 positions shown, 18 images · non-contrast
Comparison: Chest radiograph dated 10/05/2019.

CLINICAL DATA: 53-year-old male with history of basal cell
carcinoma. Patient presenting for follow-up of pulmonary nodule.

EXAM:
CT CHEST WITHOUT CONTRAST
TECHNIQUE: Multidetector CT imaging of the chest was performed following the
standard protocol without IV contrast.

[Series 2: thorax · axial · 0.94mm/px · z∈[-332,-40]mm · 12 of 174 slices shown, 15 images]
[im 14/174  mediastinal]
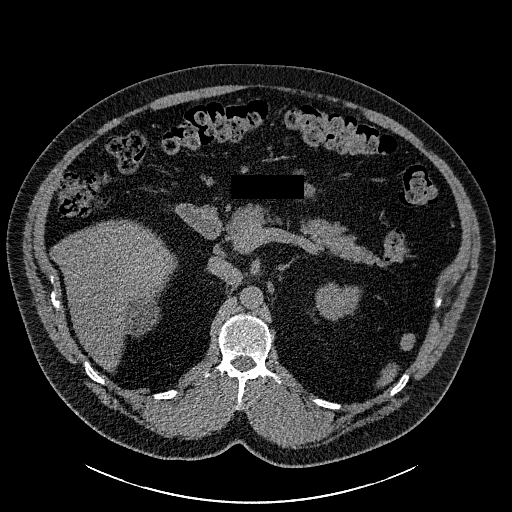
[im 14/174  lung]
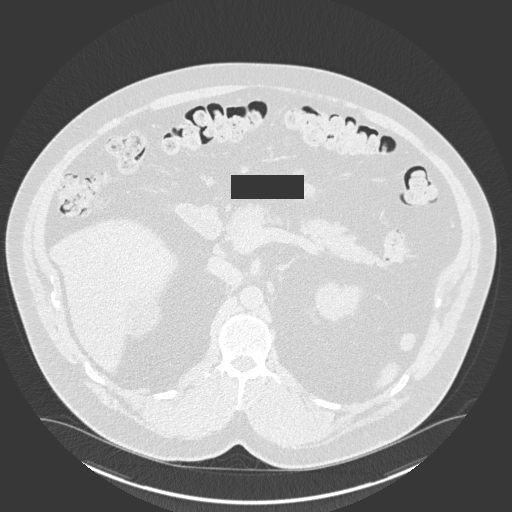
[im 27/174  lung]
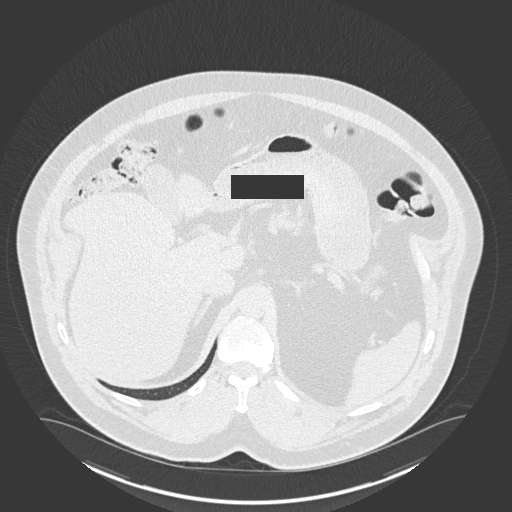
[im 40/174  lung]
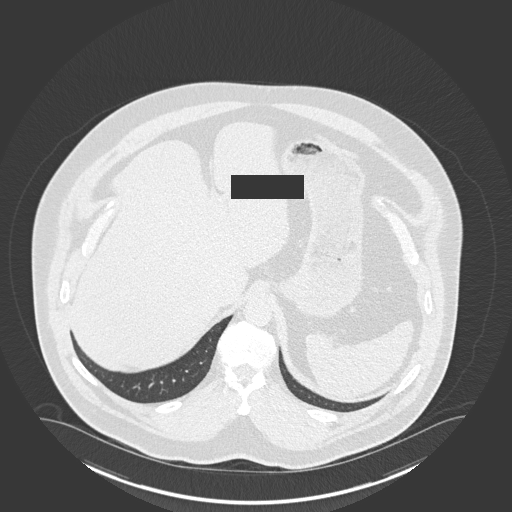
[im 54/174  lung]
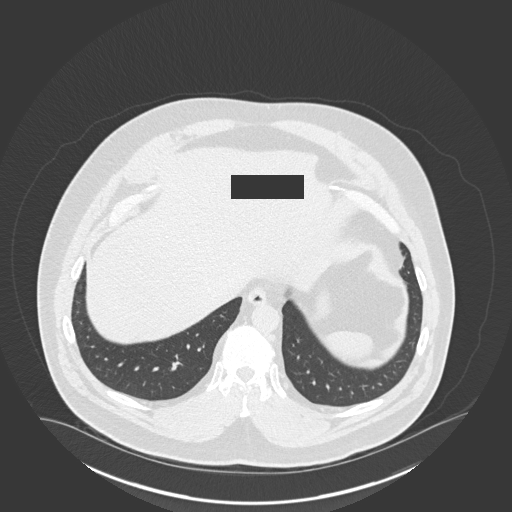
[im 67/174  mediastinal]
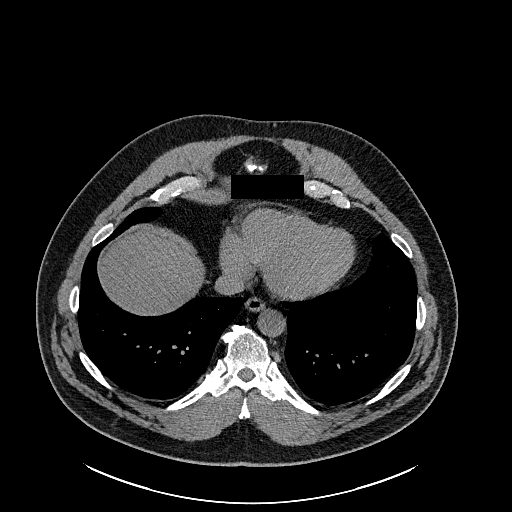
[im 67/174  lung]
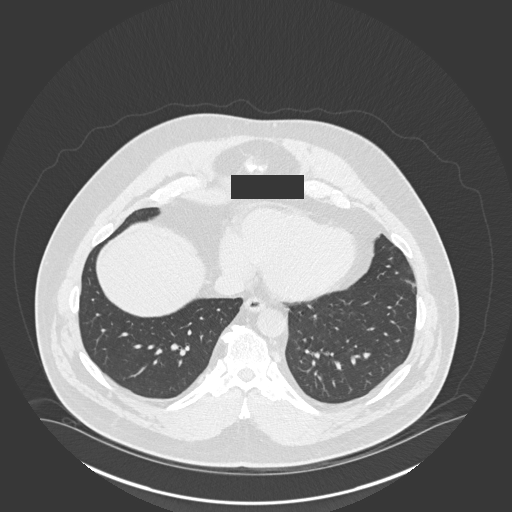
[im 80/174  lung]
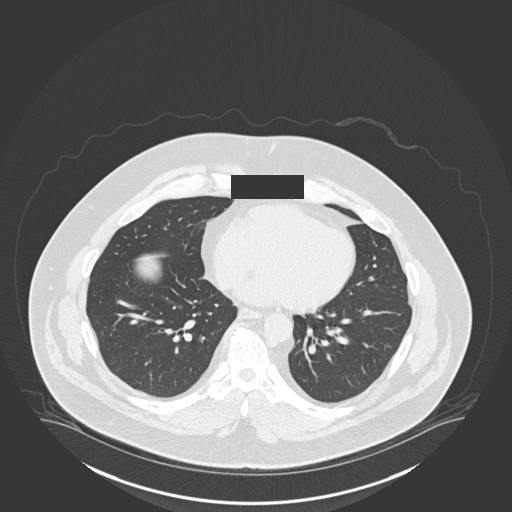
[im 94/174  lung]
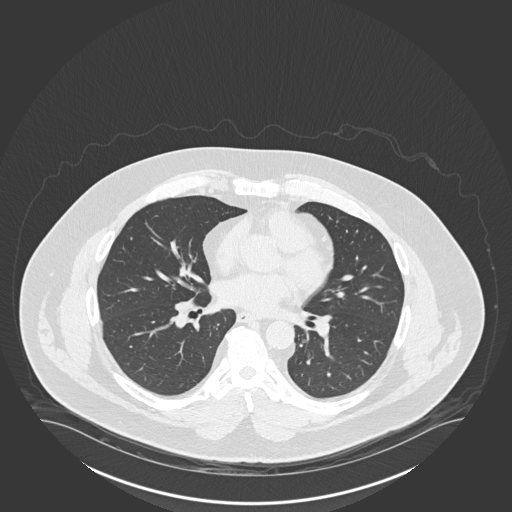
[im 107/174  lung]
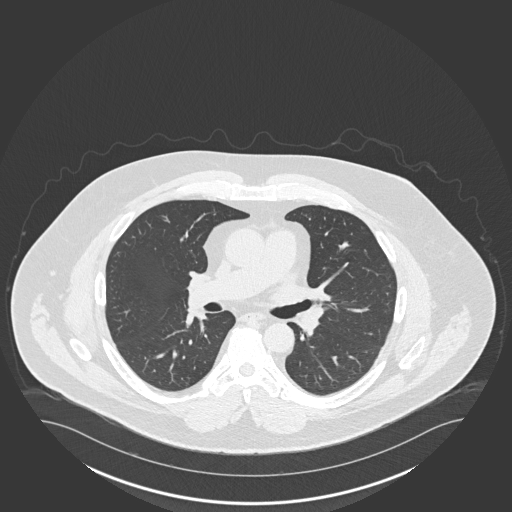
[im 120/174  mediastinal]
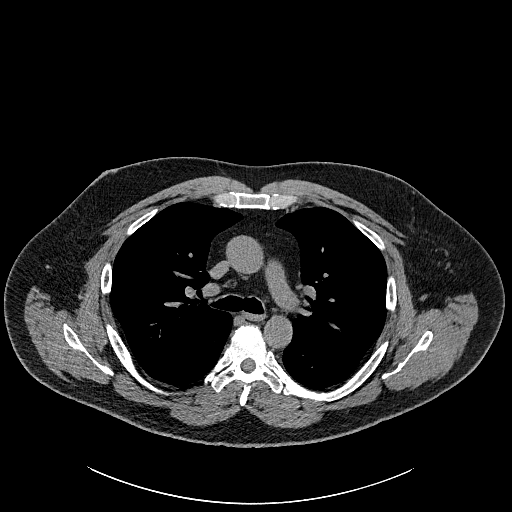
[im 120/174  lung]
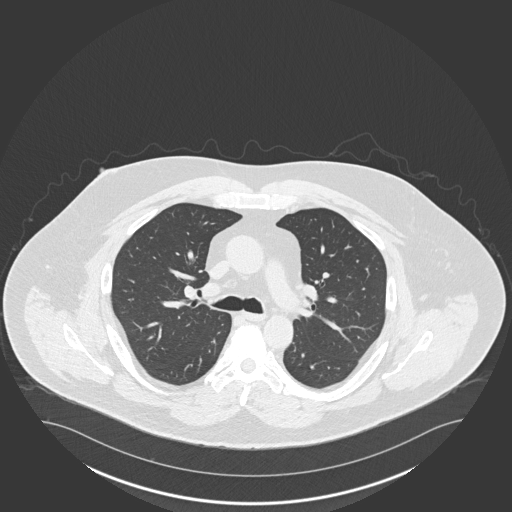
[im 134/174  lung]
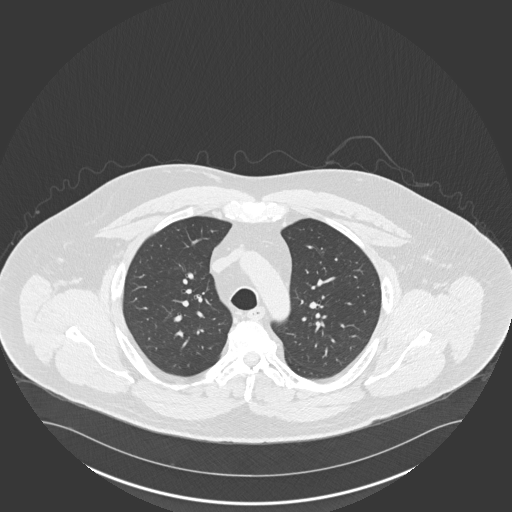
[im 147/174  lung]
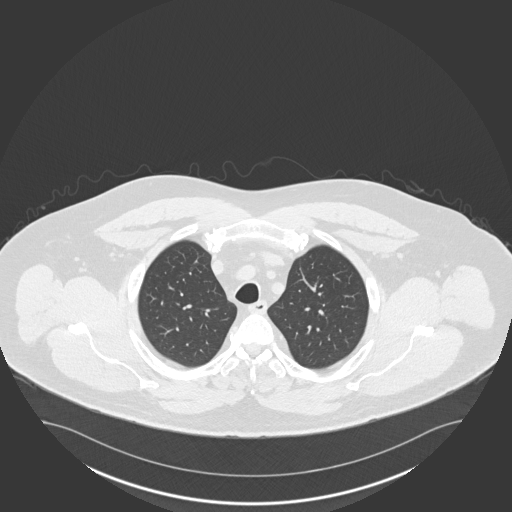
[im 160/174  lung]
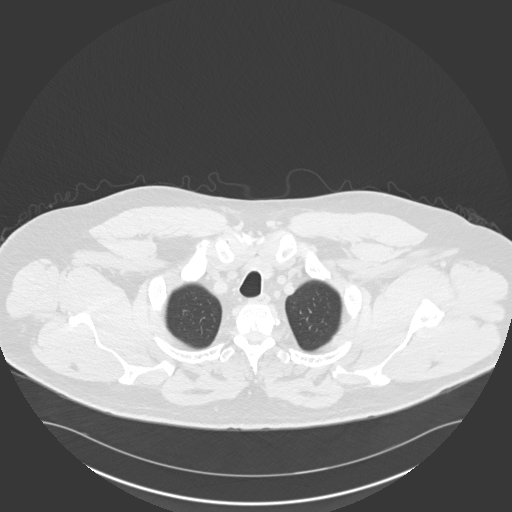

[Series 5: coronal · coronal · 0.68mm/px · 3 of 151 slices shown]
[im 31/151  lung]
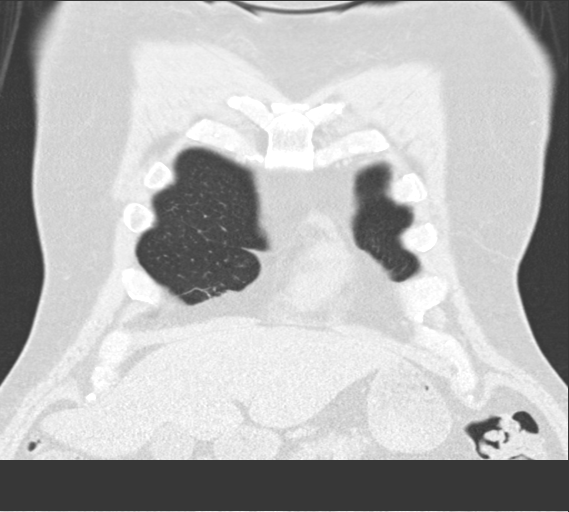
[im 61/151  lung]
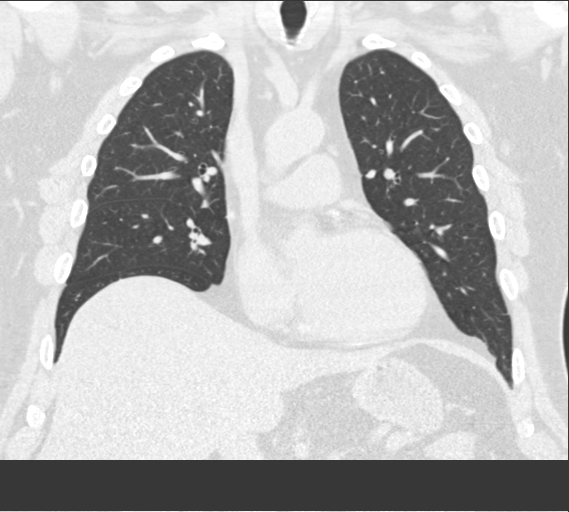
[im 91/151  lung]
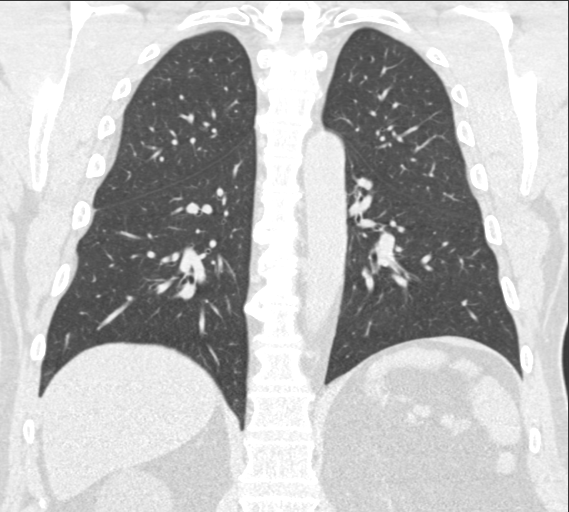

[15 of 36 positions shown; findings below may reference images not displayed]

FINDINGS: Evaluation of this exam is limited in the absence of intravenous
contrast.

Cardiovascular: There is no cardiomegaly or pericardial effusion.
Coronary vascular calcification primarily involving the LAD and RCA.
The thoracic aorta and central pulmonary arteries are grossly
unremarkable on this noncontrast CT.

Mediastinum/Nodes: There is no hilar or mediastinal adenopathy. The
esophagus and the thyroid gland are grossly unremarkable. No
mediastinal fluid collection.

Lungs/Pleura: The lungs are clear. There is no pleural effusion or
pneumothorax. The central airways are patent. No pulmonary nodule
identified.

Upper Abdomen: Mild fatty infiltration of the liver. There is a
partially visualized 7 cm right renal upper pole cyst.

Musculoskeletal: No chest wall mass or suspicious bone lesions
identified.
IMPRESSION: No acute intrathoracic pathology.  No pulmonary nodule.

## 2020-12-09 ENCOUNTER — Other Ambulatory Visit: Payer: Self-pay | Admitting: Internal Medicine

## 2020-12-12 ENCOUNTER — Telehealth: Payer: Self-pay | Admitting: Internal Medicine

## 2020-12-12 MED ORDER — FASENRA PEN 30 MG/ML ~~LOC~~ SOAJ: 30.0000 mg | SUBCUTANEOUS | 5 refills | Status: DC

## 2020-12-12 NOTE — Telephone Encounter (Signed)
Called and spoke [place] ([place]).  Claiborne Billings stated Patient has not be able to reorder his [name] injection, because new prescription is needed.  Claiborne Billings stated specialty pharmacy had reached out for refills and no one had responded.  Varney Baas prescription sent to [facility].  Nothing further at this time.

## 2020-12-13 ENCOUNTER — Other Ambulatory Visit: Payer: Self-pay | Admitting: Internal Medicine

## 2020-12-13 NOTE — Telephone Encounter (Signed)
Patient requesting refill on Clorazepate 7.5mg . Last office visit was on 11/21/2. Is this ok to refill?  Dr. Annamaria Boots please advise.   Thank you

## 2020-12-14 NOTE — Telephone Encounter (Signed)
Clorazepate refilled

## 2021-01-04 ENCOUNTER — Telehealth: Payer: Self-pay | Admitting: Internal Medicine

## 2021-01-04 MED ORDER — IPRATROPIUM-ALBUTEROL 0.5-2.5 (3) MG/3ML IN SOLN: RESPIRATORY_TRACT | 1 refills | Status: DC

## 2021-01-04 NOTE — Telephone Encounter (Signed)
Called and spoke with patient's wife Claiborne Billings. She stated that the patient was in need for a refill on his DuoNeb solution to [facility] [address]. I advised her that I would go ahead and sent this in for him. She verbalized understanding.   Nothing further needed at time of call.

## 2021-01-09 ENCOUNTER — Telehealth: Payer: Self-pay | Admitting: Internal Medicine

## 2021-01-09 MED ORDER — NIRMATRELVIR/RITONAVIR (PAXLOVID)TABLET: 3.0000 | ORAL_TABLET | Freq: Two times a day (BID) | ORAL | 0 refills | Status: AC

## 2021-01-09 NOTE — Telephone Encounter (Signed)
I think it is ok to go ahead with Fasenra on schedule  Recommend we order Paxlovid 5 day course for the Covid

## 2021-01-09 NOTE — Telephone Encounter (Signed)
Pt has tested positive for Covid-19 and was wanting to know when will someone send the rx for Fasenra. Pharmacy;   [id number] - [place], [place] - [address]; [phone]

## 2021-01-09 NOTE — Telephone Encounter (Signed)
Called and spoke with patient, advised of recommendations per Dr. Annamaria Boots.  [facility]  Advised on how to take the medication and to stay hydrated and rest.  Advised to call if he has sob, unable to keep anything down.  Advised to quarantine until symptoms are gone.  Patient verbalized understanding.

## 2021-01-09 NOTE — Telephone Encounter (Signed)
Received the following message from patient:   "  Dr. Annamaria Boots, I have just tested positive for Covid this evening at about 6:00 pm with an at home test provided by the [facility]. With my medical history  of COPD ,what should I do now. I am supposed  to take my [place] shot tomorrow evening at home. Should I wait, take it now or go to the hospital. At present in only coughing a little with some small amounts of fleem. I tested negative just 3 days ago. I've had both my covid vaccination shots plus a booster. I am somewhat nervous considering my COPD and asthma. A phone call would be greatly appreciated in the morning. [phone] or [phone]. Sincerely, [name] Roux. [name]"  Dr. Annamaria Boots, can you please advise? Thanks!

## 2021-02-02 ENCOUNTER — Telehealth: Payer: Self-pay | Admitting: Internal Medicine

## 2021-02-02 NOTE — Telephone Encounter (Signed)
Called and spoke with patient, he states that [facility] has been trying to reach Dr. Annamaria Boots to get another authorization for his fasenra.  Advised I would call and see what they need from our office.  He verbalized understanding.  He asked that we give him a call back and let him know the outcome and if he does not answer to leave him a message.  Advised we would leave him a message if we are unable to reach him with the outcome.  [employer] and spoke with [place] who states that the script for [name] did not have instructions.  I was transferred to [facility] (pharmacist), who states they received a fax order for the fasenra with no refills.  I gave a verbal order for the fasenra that is located in the [facility] scripts that they state they did not receive.   Gave exact script as written with 5 refills.  Nothing further needed.  Called and spoke with the patient advised him that the prescription has been straighten out and the pharmacy has what they need and they will call him when it is ready to ship.  He verbalized understanding.  Nothing further needed.

## 2021-02-12 ENCOUNTER — Other Ambulatory Visit: Payer: Self-pay | Admitting: Internal Medicine

## 2021-02-12 MED ORDER — CLORAZEPATE DIPOTASSIUM 7.5 MG PO TABS: ORAL_TABLET | ORAL | 1 refills | Status: DC

## 2021-02-12 NOTE — Telephone Encounter (Signed)
Pts wife is wanting to know if we received a medication refill from [facility] for a 90 day supply for Tranxene; pts wife stated it must be a 3 month supply.   Pharmacy; [facility]  [address]s regard; [phone]

## 2021-02-12 NOTE — Telephone Encounter (Signed)
Clorazepate sent to [facility]

## 2021-02-12 NOTE — Telephone Encounter (Signed)
Called and spoke with patient's wife. She stated that his insurance is now requiring that his refills go to [facility] instead of the local [facility].   She is requesting a 3 month supply of clorazepate 7.'5mg'$  to be sent to [facility].   Dr. Annamaria Boots, please advise if you are ok with this refill. Thanks!

## 2021-02-13 ENCOUNTER — Telehealth: Payer: Self-pay | Admitting: Internal Medicine

## 2021-02-13 NOTE — Telephone Encounter (Signed)
Spoke with the pt's spouse  She states that since the time of her call, she got text from pharmacy that the rx was available  She will call back if there is an issue  Nothing further needed

## 2021-02-15 MED ORDER — PREDNISONE 5 MG PO TABS: 5.0000 mg | ORAL_TABLET | Freq: Every day | ORAL | 1 refills | Status: DC

## 2021-02-15 NOTE — Telephone Encounter (Signed)
Called and spoke to pt's wife, [name] [name]. Informed her of the refill CY sent to pharmacy. She verbalized understanding and states that the pt also needs pred sent to [facility]. Pred sent to pharmacy. Nothing further needed at this time.

## 2021-02-20 ENCOUNTER — Other Ambulatory Visit: Payer: Self-pay | Admitting: *Deleted

## 2021-02-20 MED ORDER — IPRATROPIUM-ALBUTEROL 0.5-2.5 (3) MG/3ML IN SOLN: RESPIRATORY_TRACT | 1 refills | Status: DC

## 2021-02-21 ENCOUNTER — Telehealth: Payer: Self-pay | Admitting: Internal Medicine

## 2021-02-22 ENCOUNTER — Other Ambulatory Visit (HOSPITAL_COMMUNITY): Payer: Self-pay

## 2021-02-22 MED ORDER — TRAZODONE HCL 50 MG PO TABS: ORAL_TABLET | ORAL | 1 refills | Status: DC

## 2021-02-22 NOTE — Telephone Encounter (Signed)
Called and spoke with patient. He verbalized understanding of the trazodone. I advised him [facility] [address] He stated that the RX would need to be sent to a different pharmacy but he was unsure of which one. He wants to speak with his wife to find out the name of the pharmacy.   I advised him to call us back once he has spoken with his wife. He verbalized understanding.

## 2021-02-22 NOTE — Telephone Encounter (Signed)
Dr. Annamaria Boots, can you please advise about the clorazepate? Thanks!

## 2021-02-22 NOTE — Telephone Encounter (Signed)
It would be better to try Trazodone first with a local script before we send for 3 months worth. If it works ok then we can send 17 day script.

## 2021-02-22 NOTE — Telephone Encounter (Signed)
Returned call to patient who states his wife did not call about [name]. She called about clorazepate which is on back-order at different pharmacies (currently filled at [facility] order). They state that they have tried reaching out to local pharmacies but no pharmacy is able to order currently.  She requested an alternative from Dr. Annamaria Boots since patient does not have access to anymore after he exhausts currently supply. He has one week of clorazepate remaining. She is requesting 90 day supply (same as clorazepate) of alternative.  [facility] will need clorazepate rx to see if they can order medication for patient.  I don't see clorazepate as being on national drug shortage per FDA website though [facility] not [facility] in stock per pharmacist there. She also stated that all nearby pharmacies have limited supplies.  Routing as high priority to Dr. Annamaria Boots for recs and triage for f/u  Knox Saliva, PharmD, MPH, BCPS Clinical Pharmacist (Rheumatology and Pulmonology)

## 2021-02-22 NOTE — Telephone Encounter (Signed)
Called and spoke with patient. He stated that his wife stated that the trazodone '50mg'$  needs to be sent to [facility] has a 90 day supply. [facility].   The RX will need to be cancelled at [facility] if Dr. Annamaria Boots approves the 90 day supply.   Dr. Annamaria Boots, can you please advise? Thanks!

## 2021-02-22 NOTE — Telephone Encounter (Signed)
Called patient but he did not answer. Left message for him to call back.  

## 2021-02-22 NOTE — Telephone Encounter (Signed)
Clorazepate may be associated with withdrawal unless tapered off.  I have sent [facility]  Try 1/ 2 tab clorazepate while it lasts, along with one or 2 tabs of trazodone. See if that will serve. It doesn't sound as if any local drug store can give more than a few clorazepate for now.

## 2021-02-23 MED ORDER — TRAZODONE HCL 50 MG PO TABS: ORAL_TABLET | ORAL | 1 refills | Status: DC

## 2021-02-23 NOTE — Telephone Encounter (Signed)
I called and spoke with husband wife, who is on DPR, regarding Trazodone script. Wife stated they needed it [facility]. I called and [facility] and send in script for Trazdone, 30 day script. Patient wife wanted 70 day script but I went over Dr. Annamaria Boots recs who wanted to do 30 day first. Wife verbalized understanding and I sent in order to [facility]. Nothing further needed.

## 2021-02-23 NOTE — Telephone Encounter (Signed)
Pt's wife calling back to get Trazodone sent in to [facility] because the insurance does not cover for it to be [facility]. Pt's wife can be reached at [phone]

## 2021-03-17 ENCOUNTER — Other Ambulatory Visit: Payer: Self-pay | Admitting: *Deleted

## 2021-03-17 MED ORDER — IPRATROPIUM-ALBUTEROL 0.5-2.5 (3) MG/3ML IN SOLN: RESPIRATORY_TRACT | 1 refills | Status: DC

## 2021-04-01 ENCOUNTER — Other Ambulatory Visit: Payer: Self-pay | Admitting: Internal Medicine

## 2021-04-04 NOTE — Telephone Encounter (Signed)
Ok to refill as before. I should see him for 1 year f/u.

## 2021-04-04 NOTE — Telephone Encounter (Signed)
Patient requesting refill for Trazodone. Last office visit 06/05/20. Last refill 02/23/21 #60 1 RF.  Dr. Annamaria Boots is it ok to send in refill?  Thanks

## 2021-04-06 ENCOUNTER — Encounter: Payer: Self-pay | Admitting: Gastroenterology

## 2021-04-06 ENCOUNTER — Ambulatory Visit (INDEPENDENT_AMBULATORY_CARE_PROVIDER_SITE_OTHER): Payer: 59 | Admitting: Gastroenterology

## 2021-04-06 ENCOUNTER — Telehealth: Payer: Self-pay | Admitting: Internal Medicine

## 2021-04-06 VITALS — BP 138/86 | HR 80 | Ht 69.0 in | Wt 287.0 lb

## 2021-04-06 DIAGNOSIS — Z860101 Personal history of adenomatous and serrated colon polyps: Secondary | ICD-10-CM | POA: Insufficient documentation

## 2021-04-06 DIAGNOSIS — R933 Abnormal findings on diagnostic imaging of other parts of digestive tract: Secondary | ICD-10-CM | POA: Diagnosis not present

## 2021-04-06 DIAGNOSIS — Z86010 Personal history of colonic polyps: Secondary | ICD-10-CM | POA: Diagnosis not present

## 2021-04-06 MED ORDER — FASENRA PEN 30 MG/ML ~~LOC~~ SOAJ: 30.0000 mg | SUBCUTANEOUS | 1 refills | Status: DC

## 2021-04-06 MED ORDER — SUPREP BOWEL PREP KIT 17.5-3.13-1.6 GM/177ML PO SOLN: 1.0000 | ORAL | 0 refills | Status: DC

## 2021-04-06 NOTE — Telephone Encounter (Signed)
Sent refill to [facility]. He has f/u appt with Dr. Annamaria Boots on 06/11/21  Knox Saliva, PharmD, MPH, BCPS Clinical Pharmacist (Rheumatology and Pulmonology)

## 2021-04-06 NOTE — Telephone Encounter (Signed)
Patient scheduled OV is 06/11/21, with Dr. Annamaria Boots. Prescription sent to pharmacy.

## 2021-04-06 NOTE — Patient Instructions (Signed)
You have been scheduled for a colonoscopy. Please follow written instructions given to you at your visit today.  Please pick up your prep supplies at the pharmacy within the next 1-3 days. If you use inhalers (even only as needed), please bring them with you on the day of your procedure.   If you are age 55 or younger, your body mass index should be between 19-25. Your Body mass index is 42.38 kg/m. If this is out of the aformentioned range listed, please consider follow up with your Primary Care Provider.   __________________________________________________________  The  GI providers would like to encourage you to use [facility] to communicate with providers for non-urgent requests or questions.  Due to long hold times on the telephone, sending your provider a message by [facility] may be a faster and more efficient way to get a response.  Please allow 48 business hours for a response.  Please remember that this is for non-urgent requests.   We have sent the following medications to your pharmacy for you to pick up at your convenience: [place] provider has requested that you go to the basement level for lab work 1-2 weeks before your procedure. Press "B" on the elevator. The lab is located at the first door on the left as you exit the elevator.  Thank you for choosing me and [facility].  Dr. Rush Landmark

## 2021-04-06 NOTE — Progress Notes (Signed)
GASTROENTEROLOGY OUTPATIENT CLINIC VISIT   Primary Care Provider Marrian Salvage, [address] [place] [address] [phone]  Patient Profile: [name] [name] is a 55 y.o. male with a pmh significant for sleep apnea, obesity, asthma, allergies, glaucoma, skin cancers, nephrolithiasis, colon polyps (TAs).  The patient presents to the [facility] for an evaluation and management of problem(s) noted below:  Problem List 1. Hx of adenomatous colonic polyps   2. Abnormal colonoscopy      History of Present Illness Please see initial consultation note for full details of HPI.  Interval History The patient underwent his follow-up colonoscopy with 2 EMR's back in 2021.  Today he comes in to further discuss next steps and follow-up.  The patient's 2 large polyps were tubular adenomas.  One of them required a 2 piece, piecemeal resection, the other was a complete single resection EMR.  In total in 1 year, the patient was found to have 16 total tubular adenomas.  Patient has done well.  He has no significant GI complaints at this time.  His bowels are moving regularly.  He occasionally has some gas as a result of a medication he was initiated on but does not feel significant bloating or distention or pain.  He denies any dysphagia or odynophagia symptoms.  GI Review of Systems Positive as above Negative for nausea, vomiting, early satiety   Review of Systems General: Denies fevers/chills/weight loss unintentionally Cardiovascular: Denies chest pain Pulmonary: Denies shortness of breath Gastroenterological: See HPI Genitourinary: Denies darkened urine Hematological: Denies easy bruising/bleeding Dermatological: Denies jaundice Psychological: Mood is stable   Medications Current Outpatient Medications  Medication Sig Dispense Refill   albuterol (VENTOLIN HFA) 108 (90 Base) MCG/ACT inhaler INHALE 2 PUFFS INTO THE LUNGS EVER2Y 6  HOURS AS NEEDED FOR WHEEZING OR FOR SHORTNESS OF BREATH 18 g 12   budesonide-formoterol (SYMBICORT) 160-4.5 MCG/ACT inhaler INHALE 2 PUFFS INTO THE LUNGS TWICE A DAY RINSE MOUTH AFTER USE 30.6 each 1   clonazePAM (KLONOPIN) 1 MG tablet 1 OR 2 TABLETS BEFORE BED FOR SLEEP 30 tablet 5   fexofenadine (ALLEGRA) 180 MG tablet Take 360 mg by mouth in the morning and at bedtime.     fluticasone (FLONASE) 50 MCG/ACT nasal spray SPRAY 2 SPRAYS INTO EACH NOSTRIL EVERY DAY 54 mL 3   ipratropium-albuterol (DUONEB) 0.5-2.5 (3) MG/3ML SOLN INHALE 1 VIAL VIA NEBULIZER 4 TIMES A DAY AS NEEDED 1080 mL 1   montelukast (SINGULAIR) 10 MG tablet Take 1 tablet (10 mg total) by mouth daily. 90 tablet 3   Nebulizers (COMPRESSOR NEBULIZER) MISC 1 Device by Does not apply route 4 (four) times daily as needed. 1 each 0   predniSONE (DELTASONE) 5 MG tablet Take 1 tablet (5 mg total) by mouth daily. 90 tablet 1   Spacer/[name]-Holding Chambers (AEROCHAMBER MV) inhaler Use as instructed 1 each 0   SUPREP BOWEL PREP KIT 17.5-3.13-1.6 GM/177ML SOLN Take 1 kit by mouth as directed. For colonoscopy prep 354 mL 0   Benralizumab (FASENRA PEN) 30 MG/ML SOAJ Inject 1 mL (30 mg total) into the skin every 8 (eight) weeks. 1 mL 1   traZODone (DESYREL) 50 MG tablet TAKE 1 TO 2 TABLETS BY  MOUTH AT BEDTIME FOR SLEEP 60 tablet 1   No current facility-administered medications for this visit.    Allergies No Known Allergies   Histories Past Medical History:  Diagnosis Date   Allergic rhinitis    Allergy  seasonal allergies   Angio-edema    Cancer (HCC)    basal cell-nose   Complication of anesthesia    difficulty waking up from lithotripsy and nasal surgery   Glaucoma    has occasional pressure issues in eyes   History of chronic bronchitis    History of kidney stones    Left ureteral stone    Mild obstructive sleep apnea    per study 02-09-2015  recommended oral appliance / cpap-- pt tried intolerant   Moderate persistent  asthma    pulmologist-  dr young   Pneumonia 1994   Urticaria    Past Surgical History:  Procedure Laterality Date   COLONOSCOPY  12/07/2019   polyps   COLONOSCOPY WITH PROPOFOL N/A 05/08/2020   Procedure: COLONOSCOPY WITH PROPOFOL;  Surgeon: Irving Copas., MD;  Location: [facility] ENDOSCOPY;  Service: Gastroenterology;  Laterality: N/A;   CYSTO/  LEFT URETERAL STENT PLACEMENT  03-10-2002   CYSTOSCOPY W/ URETERAL STENT PLACEMENT Left 08/07/2015   Procedure: CYSTOSCOPY, Left Retrograde Pyelogram, Left Ureteral Stent Placement;  Surgeon: Irine Seal, MD;  Location: [facility]S;  Service: Urology;  Laterality: Left;   CYSTOSCOPY/URETEROSCOPY/HOLMIUM LASER/STENT PLACEMENT Left 08/17/2015   Procedure: CYSTOSCOPY / LEFT URETEROSCOPY / HOLMIUM LASER LITHOTRIPSY;  Surgeon: Irine Seal, MD;  Location: [facility];  Service: Urology;  Laterality: Left;   ENDOSCOPIC MUCOSAL RESECTION N/A 05/08/2020   Procedure: ENDOSCOPIC MUCOSAL RESECTION;  Surgeon: Rush Landmark Telford Nab., MD;  Location: [place];  Service: Gastroenterology;  Laterality: N/A;   EXTRACORPOREAL SHOCK WAVE LITHOTRIPSY  2006   HEMOSTASIS CLIP PLACEMENT  05/08/2020   Procedure: HEMOSTASIS CLIP PLACEMENT;  Surgeon: Irving Copas., MD;  Location: [place];  Service: Gastroenterology;;   POLYPECTOMY  05/08/2020   Procedure: POLYPECTOMY;  Surgeon: Irving Copas., MD;  Location: [place];  Service: Gastroenterology;;   SEPTOPLASTY WITH ETHMOIDECTOMY, AND MAXILLARY ANTROSTOMY  09-24-2010   SINOSCOPY     STONE EXTRACTION WITH BASKET Left 08/17/2015   Procedure: STONE EXTRACTION WITH BASKET;  Surgeon: Irine Seal, MD;  Location: [facility];  Service: Urology;  Laterality: Left;   SUBMUCOSAL LIFTING INJECTION  05/08/2020   Procedure: SUBMUCOSAL LIFTING INJECTION;  Surgeon: Rush Landmark Telford Nab., MD;  Location: [facility] ENDOSCOPY;  Service: Gastroenterology;;   WISDOM TOOTH EXTRACTION  1994    Social History   Socioeconomic History   Marital status: Married    Spouse name: Not on file   Number of children: Not on file   Years of education: Not on file   Highest education level: Not on file  Occupational History   Not on file  Tobacco Use   Smoking status: Never   Smokeless tobacco: Never  Vaping Use   Vaping Use: Never used  Substance and Sexual Activity   Alcohol use: Yes    Comment: socially    Drug use: Never   Sexual activity: Yes  Other Topics Concern   Not on file  Social History Narrative   Not on file   Social Determinants of Health   Financial Resource Strain: Not on file  Food Insecurity: Not on file  Transportation Needs: Not on file  Physical Activity: Not on file  Stress: Not on file  Social Connections: Not on file  Intimate Partner Violence: Not on file   Family History  Problem Relation Age of Onset   Asthma Father    Other Father        bronchitis   Allergic rhinitis Father  Diabetes Maternal Grandfather 24       Heavy smoker   Eczema Neg Hx    Immunodeficiency Neg Hx    Urticaria Neg Hx    Colon polyps Neg Hx    Colon cancer Neg Hx    Esophageal cancer Neg Hx    Rectal cancer Neg Hx    Stomach cancer Neg Hx    Inflammatory bowel disease Neg Hx    Liver disease Neg Hx    Pancreatic cancer Neg Hx    I have reviewed his medical, social, and family history in detail and updated the electronic medical record as necessary.    PHYSICAL EXAMINATION  BP 138/86 (BP Location: Left Arm, Patient Position: Sitting, Cuff Size: Normal)   Pulse 80   Ht _0  (1.753 m) Comment: height measured without shoes  Wt 287 lb (130.2 kg)   BMI 42.38 kg/m  Wt Readings from Last 3 Encounters:  04/06/21 287 lb (130.2 kg)  07/07/20 285 lb (129.3 kg)  06/05/20 280 lb 6.4 oz (127.2 kg)  GEN: NAD, appears stated age, doesn't appear chronically ill PSYCH: Cooperative, without pressured speech EYE: Conjunctivae pink, sclerae anicteric ENT:  MMM CV: Nontachycardic RESP: No audible wheezing GI: NABS, soft, protuberant abdomen, rounded, obese, NT, no rebound MSK/EXT: No significant lower extremity edema noted SKIN: No jaundice NEURO:  Alert & Oriented x 3, no focal deficits   REVIEW OF DATA  I reviewed the following data at the time of this encounter:  GI Procedures and Studies  October 2021 Colonoscopy - Hemorrhoids found on digital rectal exam. - The examined portion of the ileum was normal. - One 30 mm polyp in the mid transverse colon, removed with piecemeal mucosal resection. Resected and retrieved. Treated with a hot snare. Clips (MR conditional) were placed. - One 18 mm polyp in the mid transverse colon, removed with mucosal resection. Resected and retrieved. Treated with a hot snare. Clips (MR conditional) were placed. - Three 2 to 3 mm polyps in the sigmoid colon and in the transverse colon, removed with a cold snare. Resected and retrieved. - Normal mucosa in the entire examined colon otherwise. - Non-bleeding non-thrombosed external and internal hemorrhoids.  Pathology FINAL MICROSCOPIC DIAGNOSIS:  A. COLON, MULTIPLE, POLYPECTOMY:  - Tubular adenoma (s).  - No high-grade dysplasia or carcinoma.  B. COLON POLYP, TRANSVERSE, DISTAL EMR:  - Tubular adenoma (s).  - No high-grade dysplasia or carcinoma.  C. COLON POLYP, TRANSVERSE, PROXIMAL EMR:  - Tubular adenoma.  - No high-grade dysplasia or carcinoma.  Laboratory Studies  Reviewed those in epic  Imaging Studies  No relevant studies to review   ASSESSMENT  Mr. Agostinelli is a 55 y.o. male with a pmh significant for sleep apnea, obesity, asthma, allergies, glaucoma, skin cancers, nephrolithiasis, colon polyps (TAs).  The patient is seen today for evaluation and management of:  1. Hx of adenomatous colonic polyps   2. Abnormal colonoscopy    The patient is clinically and hemodynamically stable.  Patient has a history of significant adenomatous colon  polyps (16 removed within 1 year).  He is due for his follow-up after a piecemeal resection large EMR in 2021.  Based on the total number of polyps, he does not meet criteria for genetics testing as of yet but if the patient is found to have a significant proportion over the coming years he may benefit from genetics referral.  Hopefully we do not see evidence of recurrence but if we do I will be  prepared for other techniques of advanced polypectomy such as endoscopic mucosal resection, OVESCO full-thickness resection, Endorotor morcellation, and tissue ablation via fulguration.  The risks and benefits of endoscopic evaluation were discussed with the patient; these include but are not limited to the risk of perforation, infection, bleeding, missed lesions, lack of diagnosis, severe illness requiring hospitalization, as well as anesthesia and sedation related illnesses.  All patient questions were answered to the best of my ability, and the patient agrees to the aforementioned plan of action with follow-up as indicated.   PLAN  Preprocedure labs to be obtained within a month of the procedure Proceed with scheduling colonoscopy with EMR follow-up Holding on genetics testing for now but certainly in the setting of the number of polyps found we may consider that in the future   Orders Placed This Encounter  Procedures   Procedural/ Surgical Case Request: COLONOSCOPY WITH PROPOFOL, ENDOSCOPIC MUCOSAL RESECTION   CBC   Basic Metabolic Panel (BMET)   INR/PT   Ambulatory referral to Gastroenterology     New Prescriptions   SUPREP BOWEL PREP KIT 17.5-3.13-1.6 GM/177ML SOLN    Take 1 kit by mouth as directed. For colonoscopy prep   Modified Medications   Modified Medication Previous Medication   BENRALIZUMAB (FASENRA PEN) 30 MG/ML SOAJ Benralizumab (FASENRA PEN) 30 MG/ML SOAJ      Inject 1 mL (30 mg total) into the skin every 8 (eight) weeks.    Inject 1 mL (30 mg total) into the skin every 8 (eight)  weeks.   TRAZODONE (DESYREL) 50 MG TABLET traZODone (DESYREL) 50 MG tablet      TAKE 1 TO 2 TABLETS BY  MOUTH AT BEDTIME FOR SLEEP    1 or 2 at bedtime for sleep    Planned Follow Up No follow-ups on file.   Total Time in Face-to-Face and in Coordination of Care for patient including independent/personal interpretation/review of prior testing, medical history, examination, medication adjustment, communicating results with the patient directly, and documentation with the EHR is 20 minutes.   Justice Britain, MD [facility] Advanced [facility]oscopy Office # [phone]

## 2021-04-12 ENCOUNTER — Telehealth: Payer: Self-pay

## 2021-04-12 NOTE — Telephone Encounter (Signed)
PA renewal automatically initiated by [facility]  Submitted a Prior Authorization request to [facility] for [facility] via CoverMyMeds. Will update once we receive a response.   Key: [facility] - PA Case ID: [id number]

## 2021-04-13 ENCOUNTER — Telehealth: Payer: Self-pay | Admitting: Internal Medicine

## 2021-04-13 NOTE — Telephone Encounter (Signed)
ATC Kelly but she did not answer and her VM is not setup. Will call again on Monday.

## 2021-04-13 NOTE — Telephone Encounter (Addendum)
Received fax from [facility] stating that PA request had been cancelled due to patient being previously approved for this medication from 10/23/2020 to 10/23/2021.  Pt will need to continue filling through [facility] pharmacy.  Auth# [id number]

## 2021-04-16 MED ORDER — BUDESONIDE-FORMOTEROL FUMARATE 160-4.5 MCG/ACT IN AERO: INHALATION_SPRAY | RESPIRATORY_TRACT | 1 refills | Status: DC

## 2021-04-16 NOTE — Telephone Encounter (Signed)
Spoke with [facility]  Rx for Symbicort refilled Pt aware to keep appt 06/11/21

## 2021-04-29 ENCOUNTER — Other Ambulatory Visit: Payer: Self-pay | Admitting: Internal Medicine

## 2021-04-30 NOTE — Telephone Encounter (Signed)
Trazodone refilled through mail order

## 2021-05-02 MED ORDER — IPRATROPIUM-ALBUTEROL 0.5-2.5 (3) MG/3ML IN SOLN: RESPIRATORY_TRACT | 1 refills | Status: DC

## 2021-05-02 NOTE — Addendum Note (Signed)
Addended by: Dessie Coma on: 05/02/2021 04:53 PM   Modules accepted: Orders

## 2021-05-05 ENCOUNTER — Other Ambulatory Visit: Payer: Self-pay | Admitting: Internal Medicine

## 2021-06-08 NOTE — Progress Notes (Signed)
Patient ID: [name] [name], male    DOB: 1966/05/25, 55 y.o.   MRN: [id number]  HPI male never smoker followed for chronic obstructive asthma, OSA/quit CPAP, rhinitis/chronic sinusitis Unattended Home Sleep Test 02/09/15- mild OSA/ AHI 9.3/ hr, desat to 85%, weight 276 lbs PFT 03/06/2007 PFT 01/29/2011 Office Spirometry 02/03/2014-severe obstructive airways disease with low FVC.  FVC 3.13/62%, FEV1 2.01/50%, ratio 0.64,  FEF 25-75% 0.87/22% CT sinus 11/23/2009-moderate chronic appearing pansinusitis. Surgery 09/24/2010-polypoid chronic sinusitis CBC with differential 12/28/2015-eosinophils 7.3% CBC w diff 09/28/2018- steroid pattern EOS 0.2 k/ul FENO 10/02/16- 33 H Office spirometry 10/02/16-moderate restriction and obstruction. FVC 2.66/53%, FEV1 2.0/51%, ratio 0.75, FEF 25-75% 1.60/46% IgE 10/02/2016-295 PFT 01/29/2019- Mod obst, mild restrction, slight response to dilator, normal Diffusion Allergy Skin Testing 09/21/19- Dr Verlin Fester- + dust mites --------------------------------------------------------------------------------------   06/05/20- 55 year old male never smoker followed for chronic obstructive asthma ( Asthma- COPD Overlap Syndrome), OSA/quit CPAP, rhinitis/chronic polypoid sinusitis, Urticaria, Colon polyps, -Prednisone 5 mg, singulair, xyzal, neb Duoneb, Allegra, Ventolin hfa, Fasenra, Ccovid vax 3 Phizer Flu vax- had -----pt feels [name] [name] not as effective coughing up yellow mucus For 3-4 months has noted morning cough- white/ yellow. After that he usually feels clear rest of day. Still doing much better "90%" than he used to  . Not seasonal. Does ok giving own Fasenra injection. prednisone don to 5 mg daily.  He is satisfied with current status.  Flonase may have increased eye pressure- being watched.   11/[birth year]-  55 year old male never smoker followed for chronic obstructive asthma        ( Asthma- COPD Overlap Syndrome), OSA/quit CPAP, rhinitis/chronic polypoid sinusitis, Urticaria,  Colon polyps, -Prednisone 5 mg, singulair, xyzal, neb Duoneb, Allegra, Ventolin hfa, Symbicort 160, Fasenra, clonazepam,  Covid vax 3 Phizer Flu vax- today  -----Patient feels like breathing is good since last visit. Has been having some swelling in glands in his neck. Body weight today- Covid vax- Flu vax- ACT score- 22 Reports COVID infection this summer treated and rapidly recovered with molnupiravir. Has felt really stable from a breathing standpoint over the past year, using rescue inhaler only twice in the past month or so.  Feels [name] may be does not control him quite as solidly as it did at the beginning but with no significant breakthrough.  Using Symbicort once daily. Left submandibular gland occasionally swells, tender and itches.  Putting pressure on it squirt saliva into his mouth, then it feels better.  Rarely gland in same location on other side of neck does the same.  I suggested ENT if this happens again. We discussed trying to taper off of prednisone.   Review of Systems-See HPI       + = positive Constitutional:    No- night sweats ,fevers, chills, fatigue, lassitude. HEENT:   No - headaches,  Difficulty swallowing, Tooth/dental problems, Sore throat,                No sneezing, itching, ear ache, CV:  No chest pain,  Orthopnea, PND, swelling in lower extremities, anasarca, dizziness, palpitations GI  No heartburn, indigestion, abdominal pain, nausea, vomiting,  Resp:.See HPI    productive cough   No coughing up of blood.  change in color of mucus,  + wheeze Skin: + HPI GU: . MS:  No joint pain or swelling.  [name] Psych:   change in mood or affect.  depression and  anxiety.  No memory loss.   Objective:   Physical Exam General- Alert, Oriented, Affect-appropriate,  Distress- none acute, + Obese / thick neck Skin- + birthmark R hand Lymphadenopathy- none Head- atraumatic            Eyes- Gross vision intact, PERRLA, conjunctivae clear secretions            Ears-  Hearing, canals normal            Nose- Clear, no-Septal dev, mucus, polyps, erosion, perforation             Throat- Mallampati  III- IV , mucosa+ geographic, drainage- none, tonsils- atrophic. , Neck- flexible , trachea midline, no stridor , thyroid nl, carotid no bruit.  No obvious tenderness or mass. Chest - symmetrical excursion , unlabored           Heart/CV- RRR , no murmur , no gallop  , no rub, nl s1 s2                           - JVD- none , edema- none, stasis changes- none, varices- none           Lung-  Wheeze-none, clear, unlabored,   cough -None , dullness-none, rub- none           Chest wall-  Abd-  Br/ Gen/ Rectal- Not done, not indicated Extrem- cyanosis- none, clubbing, none, atrophy- none, strength- nl Neuro- grossly intact to observation

## 2021-06-11 ENCOUNTER — Ambulatory Visit (INDEPENDENT_AMBULATORY_CARE_PROVIDER_SITE_OTHER): Payer: 59 | Admitting: Internal Medicine

## 2021-06-11 ENCOUNTER — Other Ambulatory Visit: Payer: Self-pay

## 2021-06-11 ENCOUNTER — Encounter: Payer: Self-pay | Admitting: Internal Medicine

## 2021-06-11 VITALS — BP 120/80 | HR 85 | Temp 98.4°F | Ht 70.0 in | Wt 286.0 lb

## 2021-06-11 DIAGNOSIS — K1120 Sialoadenitis, unspecified: Secondary | ICD-10-CM | POA: Diagnosis not present

## 2021-06-11 DIAGNOSIS — Z23 Encounter for immunization: Secondary | ICD-10-CM

## 2021-06-11 DIAGNOSIS — J449 Chronic obstructive pulmonary disease, unspecified: Secondary | ICD-10-CM

## 2021-06-11 DIAGNOSIS — J4489 Other specified chronic obstructive pulmonary disease: Secondary | ICD-10-CM

## 2021-06-11 MED ORDER — ALBUTEROL SULFATE HFA 108 (90 BASE) MCG/ACT IN AERS: INHALATION_SPRAY | RESPIRATORY_TRACT | 3 refills | Status: DC

## 2021-06-11 MED ORDER — FLUTICASONE PROPIONATE 50 MCG/ACT NA SUSP: NASAL | 3 refills | Status: DC

## 2021-06-11 MED ORDER — PREDNISONE 1 MG PO TABS: ORAL_TABLET | ORAL | 1 refills | Status: DC

## 2021-06-11 MED ORDER — BUDESONIDE-FORMOTEROL FUMARATE 160-4.5 MCG/ACT IN AERO: INHALATION_SPRAY | RESPIRATORY_TRACT | 3 refills | Status: DC

## 2021-06-11 NOTE — Assessment & Plan Note (Signed)
Describes intermittent sensation of uncomfortable swelling under his left mandible.  If he presses on the area he feels saliva into his mouth.  If it continues, it might respond to an antibiotic but otherwise would refer to ENT.  He has a very thick neck but I do not feel obvious mass or tenderness today.

## 2021-06-11 NOTE — Patient Instructions (Signed)
Prednisone changed to 1 mg tabs, planning to reduce by 1 mg/ day/ month  Inhalers refilled  Order- Flu vax- standard

## 2021-06-11 NOTE — Assessment & Plan Note (Signed)
Very much better control and more stable than we remember from 5 or 8 years ago.  Credit [place].  I suggested we continue [name] as long as it seems to be working. Plan-begin trying to wean prednisone.  Discussed adrenal insufficiency.  We will try to wean by 1 mg/day/month.

## 2021-06-22 ENCOUNTER — Telehealth: Payer: Self-pay | Admitting: Gastroenterology

## 2021-06-22 MED ORDER — SUPREP BOWEL PREP KIT 17.5-3.13-1.6 GM/177ML PO SOLN: 1.0000 | ORAL | 0 refills | Status: DC

## 2021-06-22 NOTE — Telephone Encounter (Signed)
Prescription for Suprep has been sent to [facility].

## 2021-06-22 NOTE — Telephone Encounter (Signed)
Patient called and stated that the pharmacy his Suprep was sent to does not take his insurance anymore. Requesting it be resent to [facility]. Fax # [phone]

## 2021-07-04 ENCOUNTER — Encounter: Payer: Self-pay | Admitting: Gastroenterology

## 2021-07-04 ENCOUNTER — Telehealth: Payer: Self-pay | Admitting: Gastroenterology

## 2021-07-04 ENCOUNTER — Encounter (HOSPITAL_COMMUNITY): Payer: Self-pay | Admitting: Gastroenterology

## 2021-07-04 NOTE — Telephone Encounter (Signed)
Inbound call from patient, states that he has some questions about his procedure at [facility].  Requesting a call back, please advise.

## 2021-07-04 NOTE — Telephone Encounter (Signed)
Pt sent a My Chart message and all questions answered.

## 2021-07-06 ENCOUNTER — Other Ambulatory Visit: Payer: Self-pay | Admitting: Internal Medicine

## 2021-07-06 DIAGNOSIS — J449 Chronic obstructive pulmonary disease, unspecified: Secondary | ICD-10-CM

## 2021-07-06 DIAGNOSIS — J4550 Severe persistent asthma, uncomplicated: Secondary | ICD-10-CM

## 2021-07-09 ENCOUNTER — Other Ambulatory Visit: Payer: Self-pay | Admitting: *Deleted

## 2021-07-09 MED ORDER — PROVENTIL HFA 108 (90 BASE) MCG/ACT IN AERS: 1.0000 | INHALATION_SPRAY | Freq: Four times a day (QID) | RESPIRATORY_TRACT | 3 refills | Status: DC | PRN

## 2021-07-09 MED ORDER — PROVENTIL HFA 108 (90 BASE) MCG/ACT IN AERS
1.0000 | INHALATION_SPRAY | Freq: Four times a day (QID) | RESPIRATORY_TRACT | 3 refills | Status: DC | PRN
Start: 2021-07-09 — End: 2021-07-11

## 2021-07-09 NOTE — Telephone Encounter (Signed)
Refill sent for [facility] to [facility]: [phone]   Dose: 30 mg SQ every 8 weeks  Last OV: 06/11/21 Provider: Dr. Annamaria Boots  Next OV: not scheduled  Knox Saliva, PharmD, MPH, BCPS Clinical Pharmacist (Rheumatology and Pulmonology)

## 2021-07-11 ENCOUNTER — Other Ambulatory Visit: Payer: Self-pay | Admitting: *Deleted

## 2021-07-11 MED ORDER — PROVENTIL HFA 108 (90 BASE) MCG/ACT IN AERS: 1.0000 | INHALATION_SPRAY | Freq: Four times a day (QID) | RESPIRATORY_TRACT | 3 refills | Status: DC | PRN

## 2021-07-12 ENCOUNTER — Other Ambulatory Visit: Payer: Self-pay | Admitting: *Deleted

## 2021-07-12 ENCOUNTER — Other Ambulatory Visit (INDEPENDENT_AMBULATORY_CARE_PROVIDER_SITE_OTHER): Payer: 59

## 2021-07-12 DIAGNOSIS — Z86010 Personal history of colonic polyps: Secondary | ICD-10-CM

## 2021-07-12 LAB — BASIC METABOLIC PANEL
BUN: 19 mg/dL (ref 6–23)
CO2: 30 mEq/L (ref 19–32)
Calcium: 9.2 mg/dL (ref 8.4–10.5)
Chloride: 104 mEq/L (ref 96–112)
Creatinine, Ser: 1.23 mg/dL (ref 0.40–1.50)
GFR: 66.15 mL/min (ref >60.00)
Glucose, Bld: 133 mg/dL — ABNORMAL HIGH (ref 70–99)
Potassium: 4.3 mEq/L (ref 3.5–5.1)
Sodium: 140 mEq/L (ref 135–145)

## 2021-07-12 LAB — PROTIME-INR
INR: 1.1 ratio — ABNORMAL HIGH (ref 0.8–1.0)
Prothrombin Time: 11.7 s (ref 9.6–13.1)

## 2021-07-12 LAB — CBC
HCT: 45.2 % (ref 39.0–52.0)
Hemoglobin: 14.9 g/dL (ref 13.0–17.0)
MCHC: 33 g/dL (ref 30.0–36.0)
MCV: 89.8 fl (ref 78.0–100.0)
Platelets: 226 10*3/uL (ref 150.0–400.0)
RBC: 5.03 Mil/uL (ref 4.22–5.81)
RDW: 14.6 % (ref 11.5–15.5)
WBC: 7.2 10*3/uL (ref 4.0–10.5)

## 2021-07-12 MED ORDER — IPRATROPIUM-ALBUTEROL 0.5-2.5 (3) MG/3ML IN SOLN: RESPIRATORY_TRACT | 3 refills | Status: DC

## 2021-07-17 ENCOUNTER — Encounter (HOSPITAL_COMMUNITY): Payer: Self-pay | Admitting: Gastroenterology

## 2021-07-17 NOTE — Anesthesia Preprocedure Evaluation (Addendum)
Anesthesia Evaluation  Patient identified by MRN, date of birth, ID band Patient awake    Reviewed: Allergy & Precautions, NPO status , Patient's Chart, lab work & pertinent test results  History of Anesthesia Complications (+) PROLONGED EMERGENCE and history of anesthetic complications  Airway Mallampati: III  TM Distance: >3 FB Neck ROM: Full    Dental  (+) Teeth Intact, Dental Advisory Given, Caps   Pulmonary asthma , sleep apnea , pneumonia, resolved, COPD,  COPD inhaler,  Hx/o chronic bronchitis COPD /Asthma overlap   Pulmonary exam normal breath sounds clear to auscultation       Cardiovascular negative cardio ROS Normal cardiovascular exam Rhythm:Regular Rate:Normal     Neuro/Psych Anxiety negative neurological ROS     GI/Hepatic Neg liver ROS, Hx/o adenomatous colon polyps   Endo/Other  Morbid obesity  Renal/GU Renal diseaseHx/o renal calculi with pyelonephritis and AKI  negative genitourinary   Musculoskeletal  (+) Arthritis , Osteoarthritis,  Hx/o angioedema   Abdominal (+) + obese,   Peds  Hematology negative hematology ROS (+)   Anesthesia Other Findings   Reproductive/Obstetrics                            Anesthesia Physical Anesthesia Plan  ASA: 3  Anesthesia Plan: MAC   Post-op Pain Management: Minimal or no pain anticipated   Induction: Intravenous  PONV Risk Score and Plan: Treatment may vary due to age or medical condition and Propofol infusion  Airway Management Planned: Natural Airway and Simple Face Mask  Additional Equipment:   Intra-op Plan:   Post-operative Plan:   Informed Consent: I have reviewed the patients History and Physical, chart, labs and discussed the procedure including the risks, benefits and alternatives for the proposed anesthesia with the patient or authorized representative who has indicated his/her understanding and acceptance.      Dental advisory given  Plan Discussed with: CRNA and Anesthesiologist  Anesthesia Plan Comments:        Anesthesia Quick Evaluation

## 2021-07-18 ENCOUNTER — Encounter (HOSPITAL_COMMUNITY)
Admission: RE | Disposition: A | Discharge status: Home / Self Care | Payer: Self-pay | Source: Home / Self Care | Attending: Gastroenterology

## 2021-07-18 ENCOUNTER — Ambulatory Visit (HOSPITAL_COMMUNITY): Payer: 59 | Admitting: Anesthesiology

## 2021-07-18 ENCOUNTER — Encounter (HOSPITAL_COMMUNITY): Payer: Self-pay | Admitting: Gastroenterology

## 2021-07-18 ENCOUNTER — Ambulatory Visit (HOSPITAL_COMMUNITY)
Admission: RE | Admit: 2021-07-18 | Discharge: 2021-07-18 | Disposition: A | Discharge status: Home / Self Care | Payer: 59 | Attending: Gastroenterology | Admitting: Gastroenterology

## 2021-07-18 ENCOUNTER — Other Ambulatory Visit: Payer: Self-pay

## 2021-07-18 DIAGNOSIS — K641 Second degree hemorrhoids: Secondary | ICD-10-CM | POA: Diagnosis not present

## 2021-07-18 DIAGNOSIS — K6289 Other specified diseases of anus and rectum: Secondary | ICD-10-CM

## 2021-07-18 DIAGNOSIS — K621 Rectal polyp: Secondary | ICD-10-CM | POA: Diagnosis not present

## 2021-07-18 DIAGNOSIS — Z86010 Personal history of colonic polyps: Secondary | ICD-10-CM | POA: Diagnosis not present

## 2021-07-18 DIAGNOSIS — Z09 Encounter for follow-up examination after completed treatment for conditions other than malignant neoplasm: Secondary | ICD-10-CM | POA: Diagnosis not present

## 2021-07-18 DIAGNOSIS — K644 Residual hemorrhoidal skin tags: Secondary | ICD-10-CM | POA: Diagnosis not present

## 2021-07-18 HISTORY — PX: POLYPECTOMY: SHX5525

## 2021-07-18 HISTORY — PX: COLONOSCOPY WITH PROPOFOL: SHX5780

## 2021-07-18 HISTORY — PX: ENDOSCOPIC MUCOSAL RESECTION: SHX6839

## 2021-07-18 HISTORY — PX: BIOPSY: SHX5522

## 2021-07-18 SURGERY — COLONOSCOPY WITH PROPOFOL
Anesthesia: Monitor Anesthesia Care

## 2021-07-18 MED ORDER — SODIUM CHLORIDE 0.9 % IV SOLN: INTRAVENOUS | Status: DC

## 2021-07-18 MED ORDER — PROPOFOL 1000 MG/100ML IV EMUL
INTRAVENOUS | Status: AC
  Filled 2021-07-18: qty 100

## 2021-07-18 MED ORDER — LIDOCAINE 2% (20 MG/ML) 5 ML SYRINGE
INTRAMUSCULAR | Status: DC | PRN
  Administered 2021-07-18: 80 mg via INTRAVENOUS

## 2021-07-18 MED ORDER — PROPOFOL 500 MG/50ML IV EMUL
INTRAVENOUS | Status: DC | PRN
  Administered 2021-07-18: 125 ug/kg/min via INTRAVENOUS

## 2021-07-18 MED ORDER — LACTATED RINGERS IV SOLN: INTRAVENOUS | Status: DC | PRN

## 2021-07-18 MED ORDER — PROPOFOL 10 MG/ML IV BOLUS
INTRAVENOUS | Status: DC | PRN
  Administered 2021-07-18: 20 mg via INTRAVENOUS

## 2021-07-18 SURGICAL SUPPLY — 22 items

## 2021-07-18 NOTE — Transfer of Care (Signed)
Immediate Anesthesia Transfer of Care Note  Patient: [name] [name]  Procedure(s) Performed: COLONOSCOPY WITH PROPOFOL ENDOSCOPIC MUCOSAL RESECTION BIOPSY POLYPECTOMY  Patient Location: PACU  Anesthesia Type:MAC  Level of Consciousness: awake, alert  and oriented  Airway & Oxygen Therapy: Patient Spontanous Breathing and Patient connected to face mask oxygen  Post-op Assessment: Report given to RN and Post -op Vital signs reviewed and stable  Post vital signs: Reviewed and stable  Last Vitals:  Vitals Value Taken Time  BP    Temp    Pulse 83 07/18/21 0811  Resp 11 07/18/21 0811  SpO2 97 % 07/18/21 0811  Vitals shown include unvalidated device data.  Last Pain:  Vitals:   07/18/21 0705  TempSrc: Oral  PainSc: 0-No pain         Complications: No notable events documented.

## 2021-07-18 NOTE — Discharge Instructions (Signed)
YOU HAD AN ENDOSCOPIC PROCEDURE TODAY: Refer to the procedure report and other information in the discharge instructions given to you for any specific questions about what was found during the examination. If this information does not answer your questions, please call [place] office at [phone] to clarify.  ° °YOU SHOULD EXPECT: Some feelings of bloating in the abdomen. Passage of more gas than usual. Walking can help get rid of the air that was put into your GI tract during the procedure and reduce the bloating. If you had a lower endoscopy (such as a colonoscopy or flexible sigmoidoscopy) you may notice spotting of blood in your stool or on the toilet paper. Some abdominal soreness may be present for a day or two, also. ° °DIET: Your first meal following the procedure should be a light meal and then it is ok to progress to your normal diet. A half-sandwich or bowl of soup is an example of a good first meal. Heavy or fried foods are harder to digest and may make you feel nauseous or bloated. Drink plenty of fluids but you should avoid alcoholic beverages for 24 hours. If you had a esophageal dilation, please see attached instructions for diet.   ° °ACTIVITY: Your care partner should take you home directly after the procedure. You should plan to take it easy, moving slowly for the rest of the day. You can resume normal activity the day after the procedure however YOU SHOULD NOT DRIVE, use power tools, machinery or perform tasks that involve climbing or major physical exertion for 24 hours (because of the sedation medicines used during the test).  ° °SYMPTOMS TO REPORT IMMEDIATELY: °A gastroenterologist can be reached at any hour. Please call [phone]  for any of the following symptoms:  °Following lower endoscopy (colonoscopy, flexible sigmoidoscopy) °Excessive amounts of blood in the stool  °Significant tenderness, worsening of abdominal pains  °Swelling of the abdomen that is new, acute  °Fever of 100° or  higher  °Following upper endoscopy (EGD, EUS, ERCP, esophageal dilation) °Vomiting of blood or coffee ground material  °New, significant abdominal pain  °New, significant chest pain or pain under the shoulder blades  °Painful or persistently difficult swallowing  °New shortness of breath  °Black, tarry-looking or red, bloody stools ° °FOLLOW UP:  °If any biopsies were taken you will be contacted by phone or by letter within the next 1-3 weeks. Call [phone]  if you have not heard about the biopsies in 3 weeks.  °Please also call with any specific questions about appointments or follow up tests. ° °

## 2021-07-18 NOTE — Anesthesia Postprocedure Evaluation (Signed)
Anesthesia Post Note  Patient: [name] [name]  Procedure(s) Performed: COLONOSCOPY WITH PROPOFOL ENDOSCOPIC MUCOSAL RESECTION BIOPSY POLYPECTOMY     Patient location during evaluation: PACU Anesthesia Type: MAC Level of consciousness: awake and alert and oriented Pain management: pain level controlled Vital Signs Assessment: post-procedure vital signs reviewed and stable Respiratory status: spontaneous breathing, nonlabored ventilation and respiratory function stable Cardiovascular status: stable and blood pressure returned to baseline Postop Assessment: no apparent nausea or vomiting Anesthetic complications: no   No notable events documented.  Last Vitals:  Vitals:   07/18/21 0830 07/18/21 0831  BP: (!) 155/99 (!) 154/96  Pulse: 83 79  Resp: (!) 22 18  Temp:    SpO2: 97% 97%    Last Pain:  Vitals:   07/18/21 0831  TempSrc:   PainSc: 0-No pain                 Icess Bertoni A.

## 2021-07-18 NOTE — H&P (Signed)
GASTROENTEROLOGY PROCEDURE H&P NOTE   Primary Care Physician: Marrian Salvage, FNP  HPI: [name] [name] is a 55 y.o. male who presents for Colonoscopy for follow up of 2021 TC polyps x 2 EMR.  Past Medical History:  Diagnosis Date   Allergic rhinitis    Allergy    seasonal allergies   Angio-edema    Cancer (HCC)    basal cell-nose   Complication of anesthesia    difficulty waking up from lithotripsy and nasal surgery   Glaucoma    has occasional pressure issues in eyes   History of chronic bronchitis    History of kidney stones    Left ureteral stone    Mild obstructive sleep apnea    per study 02-09-2015  recommended oral appliance / cpap-- pt tried intolerant   Moderate persistent asthma    pulmologist-  dr young   Pneumonia 1994   Urticaria    Past Surgical History:  Procedure Laterality Date   COLONOSCOPY  12/07/2019   polyps   COLONOSCOPY WITH PROPOFOL N/A 05/08/2020   Procedure: COLONOSCOPY WITH PROPOFOL;  Surgeon: Irving Copas., MD;  Location: [facility] ENDOSCOPY;  Service: Gastroenterology;  Laterality: N/A;   CYSTO/  LEFT URETERAL STENT PLACEMENT  03-10-2002   CYSTOSCOPY W/ URETERAL STENT PLACEMENT Left 08/07/2015   Procedure: CYSTOSCOPY, Left Retrograde Pyelogram, Left Ureteral Stent Placement;  Surgeon: Irine Seal, MD;  Location: [facility];  Service: Urology;  Laterality: Left;   CYSTOSCOPY/URETEROSCOPY/HOLMIUM LASER/STENT PLACEMENT Left 08/17/2015   Procedure: CYSTOSCOPY / LEFT URETEROSCOPY / HOLMIUM LASER LITHOTRIPSY;  Surgeon: Irine Seal, MD;  Location: [facility];  Service: Urology;  Laterality: Left;   ENDOSCOPIC MUCOSAL RESECTION N/A 05/08/2020   Procedure: ENDOSCOPIC MUCOSAL RESECTION;  Surgeon: Rush Landmark Telford Nab., MD;  Location: [place];  Service: Gastroenterology;  Laterality: N/A;   EXTRACORPOREAL SHOCK WAVE LITHOTRIPSY  2006   HEMOSTASIS CLIP PLACEMENT  05/08/2020   Procedure: HEMOSTASIS CLIP PLACEMENT;  Surgeon:  Irving Copas., MD;  Location: [place];  Service: Gastroenterology;;   POLYPECTOMY  05/08/2020   Procedure: POLYPECTOMY;  Surgeon: Irving Copas., MD;  Location: [place];  Service: Gastroenterology;;   SEPTOPLASTY WITH ETHMOIDECTOMY, AND MAXILLARY ANTROSTOMY  09-24-2010   SINOSCOPY     STONE EXTRACTION WITH BASKET Left 08/17/2015   Procedure: STONE EXTRACTION WITH BASKET;  Surgeon: Irine Seal, MD;  Location: [facility];  Service: Urology;  Laterality: Left;   SUBMUCOSAL LIFTING INJECTION  05/08/2020   Procedure: SUBMUCOSAL LIFTING INJECTION;  Surgeon: Rush Landmark Telford Nab., MD;  Location: [facility] ENDOSCOPY;  Service: Gastroenterology;;   WISDOM TOOTH EXTRACTION  1994   Current Facility-Administered Medications  Medication Dose Route Frequency Provider Last Rate Last Admin   0.9 %  sodium chloride infusion   Intravenous Continuous Mansouraty, Telford Nab., MD       Facility-Administered Medications Ordered in Other Encounters  Medication Dose Route Frequency Provider Last Rate Last Admin   lactated ringers infusion   Intravenous Continuous PRN Williford, Jinger Neighbors, CRNA   New Bag at 07/18/21 0645    Current Facility-Administered Medications:    0.9 %  sodium chloride infusion, , Intravenous, Continuous, Mansouraty, Telford Nab., MD  Facility-Administered Medications Ordered in Other Encounters:    lactated ringers infusion, , Intravenous, Continuous PRN, Williford, Peggy D, CRNA, New Bag at 07/18/21 0645 No Known Allergies Family History  Problem Relation Age of Onset   Asthma Father    Other Father  bronchitis   Allergic rhinitis Father    Diabetes Maternal Grandfather 21       Heavy smoker   Eczema Neg Hx    Immunodeficiency Neg Hx    Urticaria Neg Hx    Colon polyps Neg Hx    Colon cancer Neg Hx    Esophageal cancer Neg Hx    Rectal cancer Neg Hx    Stomach cancer Neg Hx    Inflammatory bowel disease Neg Hx    Liver disease Neg Hx     Pancreatic cancer Neg Hx    Social History   Socioeconomic History   Marital status: Married    Spouse name: Not on file   Number of children: Not on file   Years of education: Not on file   Highest education level: Not on file  Occupational History   Not on file  Tobacco Use   Smoking status: Never   Smokeless tobacco: Never  Vaping Use   Vaping Use: Never used  Substance and Sexual Activity   Alcohol use: Yes    Comment: socially    Drug use: Never   Sexual activity: Yes  Other Topics Concern   Not on file  Social History Narrative   Not on file   Social Determinants of Health   Financial Resource Strain: Not on file  Food Insecurity: Not on file  Transportation Needs: Not on file  Physical Activity: Not on file  Stress: Not on file  Social Connections: Not on file  Intimate Partner Violence: Not on file    Physical Exam: Today's Vitals   07/18/21 0705  BP: (!) 146/100  Pulse: 82  Resp: 20  Temp: 99.2 F (37.3 C)  TempSrc: Oral  SpO2: 97%  Weight: 130 kg  Height: 5\' 10"  (1.778 m)  PainSc: 0-No pain   Body mass index is 41.12 kg/m. GEN: NAD EYE: Sclerae anicteric ENT: MMM CV: Non-tachycardic GI: Soft, NT/ND NEURO:  Alert & Oriented x 3  Lab Results: No results for input(s): WBC, HGB, HCT, PLT in the last 72 hours. BMET No results for input(s): NA, K, CL, CO2, GLUCOSE, BUN, CREATININE, CALCIUM in the last 72 hours. LFT No results for input(s): PROT, ALBUMIN, AST, ALT, ALKPHOS, BILITOT, BILIDIR, IBILI in the last 72 hours. PT/INR No results for input(s): LABPROT, INR in the last 72 hours.   Impression / Plan: This is a 55 y.o.male who presents for Colonoscopy for follow up of 2021 TC polyps x 2 EMR.  The risks and benefits of endoscopic evaluation/treatment were discussed with the patient and/or family; these include but are not limited to the risk of perforation, infection, bleeding, missed lesions, lack of diagnosis, severe illness  requiring hospitalization, as well as anesthesia and sedation related illnesses.  The patient's history has been reviewed, patient examined, no change in status, and deemed stable for procedure.  The patient and/or family is agreeable to proceed.    Justice Britain, MD [facility] Office # [phone]

## 2021-07-18 NOTE — Op Note (Signed)
[facility] °Patient Name: [name] [name] °Procedure Date: 07/18/2021 °MRN: [id number] °Attending MD: Gabriel Mansouraty , MD °Date of Birth: 09/28/1965 °CSN: [id number] °Age: 55 °Admit Type: Outpatient °Procedure:                Colonoscopy °Indications:              Surveillance: Personal history of piecemeal removal  °                          of adenoma on last colonoscopy (less than 1 year  °                          ago) °Providers:                Gabriel Mansouraty, MD, Hayleigh Westmoreland, RN,  °                          Jasmine Newkirk, Technician, Peggy Dee Williford,  °                          CRNA °Referring MD:             Laura Woodruff Murray °Medicines:                Monitored Anesthesia Care °Complications:            No immediate complications. °Estimated Blood Loss:     Estimated blood loss was minimal. °Procedure:                Pre-Anesthesia Assessment: °                          - Prior to the procedure, a History and Physical  °                          was performed, and patient medications and  °                          allergies were reviewed. The patient's tolerance of  °                          previous anesthesia was also reviewed. The risks  °                          and benefits of the procedure and the sedation  °                          options and risks were discussed with the patient.  °                          All questions were answered, and informed consent  °                          was obtained. Prior Anticoagulants: The patient has  °                          taken no previous anticoagulant   or antiplatelet                            agents. ASA Grade Assessment: III - A patient with                            severe systemic disease. After reviewing the risks                            and benefits, the patient was deemed in                            satisfactory condition to undergo the procedure.                           After obtaining  informed consent, the colonoscope                            was passed under direct vision. Throughout the                            procedure, the patient's blood pressure, pulse, and                            oxygen saturations were monitored continuously. The                            CF-HQ190L ([id number]) Olympus colonoscope was                            introduced through the anus and advanced to the 5                            cm into the ileum. The colonoscopy was performed                            without difficulty. The patient tolerated the                            procedure. Scope In: 7:44:02 AM Scope Out: 8:03:26 AM Scope Withdrawal Time: 0 hours 16 minutes 8 seconds  Total Procedure Duration: 0 hours 19 minutes 24 seconds  Findings:      The digital rectal exam findings include hemorrhoids. Pertinent       negatives include no palpable rectal lesions.      The terminal ileum and ileocecal valve appeared normal.      A tattoo was seen in the transverse colon - this is contalateral to the       scars noted below.      A medium post mucosectomy scar was found in the transverse colon. There       was some polypoid appearing tissue - likely clip artifact. This whole       area was removed with a cold snare for histology to ensure no evidence       of recurrence of adenomatous tissue.  A small post mucosectomy scar just distal to the other was found in the  °     transverse colon. The scar tissue was healthy in appearance. °     A 3 mm polyp was found in the rectum. The polyp was sessile. The polyp  °     was removed with a cold snare. Resection and retrieval were complete. °     Normal mucosa was found in the entire colon otherwise. °     Non-bleeding non-thrombosed external and internal hemorrhoids were found  °     during retroflexion, during perianal exam and during digital exam. The  °     hemorrhoids were Grade II (internal hemorrhoids that prolapse but reduce  °      spontaneously). °Impression:               - Hemorrhoids found on digital rectal exam. °                          - The examined portion of the ileum was normal. °                          - A tattoo was seen in the transverse colon on  °                          contralateral wall of scars. °                          - Post mucosectomy scar in the transverse colon  °                          with some polypoid appearing tissue - removed. °                          - Post mucosectomy scar in the transverse colon  °                          distal to this. °                          - One 3 mm polyp in the rectum, removed with a cold  °                          snare. Resected and retrieved. °                          - Normal mucosa in the entire examined colon  °                          otherwise. °                          - Non-bleeding non-thrombosed external and internal  °                          hemorrhoids. °Moderate Sedation: °     Not Applicable - Patient had care per Anesthesia. °Recommendation:           -   The patient will be observed post-procedure,                            until all discharge criteria are met.                           - Discharge patient to home.                           - Patient has a contact number available for                            emergencies. The signs and symptoms of potential                            delayed complications were discussed with the                            patient. Return to normal activities tomorrow.                            Written discharge instructions were provided to the                            patient.                           - High fiber diet.                           - Use FiberCon 1-2 tablets PO daily.                           - Continue present medications.                           - Await pathology results.                           - Repeat colonoscopy for surveillance based on                             pathology results. If no evidence of recurrence                            then would plan a 3-year follow up Colonoscopy. If                            any adenoamtous tissue is present then follow up in                            1-year.                           - The findings and recommendations were discussed  with the patient.                           - The findings and recommendations were discussed                            with the patient's family. Procedure Code(s):        --- Professional ---                           [phone], Colonoscopy, flexible; with removal of                            tumor(s), polyp(s), or other lesion(s) by snare                            technique Diagnosis Code(s):        --- Professional ---                           K64.1, Second degree hemorrhoids                           Z98.890, Other specified postprocedural states                           K62.1, Rectal polyp                           Z09, Encounter for follow-up examination after                            completed treatment for conditions other than                            malignant neoplasm                           Z86.010, Personal history of colonic polyps CPT copyright 2019 [employer] Association. All rights reserved. The codes documented in this report are preliminary and upon coder review may  be revised to meet current compliance requirements. Justice Britain, MD 07/18/2021 8:25:45 AM Number of Addenda: 0

## 2021-07-19 ENCOUNTER — Encounter: Payer: Self-pay | Admitting: Gastroenterology

## 2021-07-19 LAB — SURGICAL PATHOLOGY

## 2021-08-17 ENCOUNTER — Telehealth: Payer: Self-pay | Admitting: Internal Medicine

## 2021-08-17 ENCOUNTER — Other Ambulatory Visit: Payer: Self-pay | Admitting: Internal Medicine

## 2021-08-17 NOTE — Telephone Encounter (Signed)
Called and spoke with pt's spouse Claiborne Billings who states pt needs to have a 90-day supply of prednisone, montelukast, and also clorazepate sent to [facility].  Claiborne Billings states that pt was originally on the clorazepate and due to at one point it was not being made, pt was switched to trazodone but now they are wanting pt to be switched back to clorazepate.  Dr. Annamaria Boots, please advise on this.  No Known Allergies   Current Outpatient Medications:    Benralizumab (FASENRA PEN) 30 MG/ML SOAJ, INJECT 30MG  SUBCUTANEOUSLY  EVERY 8 WEEKS, Disp: 1 mL, Rfl: 2   budesonide-formoterol (SYMBICORT) 160-4.5 MCG/ACT inhaler, INHALE 2 PUFFS INTO THE LUNGS TWICE A DAY RINSE MOUTH AFTER USE, Disp: 30.6 each, Rfl: 3   clonazePAM (KLONOPIN) 1 MG tablet, 1 OR 2 TABLETS BEFORE BED FOR SLEEP, Disp: 30 tablet, Rfl: 5   fexofenadine (ALLEGRA) 180 MG tablet, Take 90 mg by mouth daily., Disp: , Rfl:    fluticasone (FLONASE) 50 MCG/ACT nasal spray, SPRAY 2 SPRAYS INTO EACH NOSTRIL EVERY DAY, Disp: 54 mL, Rfl: 3   ipratropium-albuterol (DUONEB) 0.5-2.5 (3) MG/3ML SOLN, INHALE 1 VIAL VIA NEBULIZER 4 TIMES A DAY AS NEEDED, Disp: 1080 mL, Rfl: 3   montelukast (SINGULAIR) 10 MG tablet, Take 1 tablet (10 mg total) by mouth daily., Disp: 90 tablet, Rfl: 3   Nebulizers (COMPRESSOR NEBULIZER) MISC, 1 Device by Does not apply route 4 (four) times daily as needed., Disp: 1 each, Rfl: 0   predniSONE (DELTASONE) 1 MG tablet, 4 mg daily x 1 month, then 3 mg x 1 month, then 2 mg x 1 month, then 1 mg daily, Disp: 250 tablet, Rfl: 1   predniSONE (DELTASONE) 5 MG tablet, Take 5 mg by mouth daily with breakfast., Disp: , Rfl:    PROVENTIL HFA 108 (90 Base) MCG/ACT inhaler, Inhale 1-2 puffs into the lungs every 6 (six) hours as needed for wheezing or shortness of breath., Disp: 3 each, Rfl: 3   Spacer/[name]-Holding Chambers (AEROCHAMBER MV) inhaler, Use as instructed, Disp: 1 each, Rfl: 0   traZODone (DESYREL) 50 MG tablet, TAKE 1 TO 2 TABLETS BY  MOUTH AT  BEDTIME FOR SLEEP, Disp: 60 tablet, Rfl: 11

## 2021-08-19 NOTE — Telephone Encounter (Signed)
Please clarify- is it 1 mg prednisone? How much is a 90 day supply?

## 2021-08-20 MED ORDER — CLORAZEPATE DIPOTASSIUM 7.5 MG PO TABS: ORAL_TABLET | ORAL | 1 refills | Status: DC

## 2021-08-20 MED ORDER — MONTELUKAST SODIUM 10 MG PO TABS: 10.0000 mg | ORAL_TABLET | Freq: Every day | ORAL | 3 refills | Status: DC

## 2021-08-20 NOTE — Telephone Encounter (Signed)
Clorazepate and montelukast refilled through [facility]

## 2021-08-20 NOTE — Telephone Encounter (Signed)
Called and spoke with [name] [name] letting her know that [facility] sent clorazepate Rx to pharmacy for pt and she verbalized understanding. Nothing further needed.

## 2021-08-20 NOTE — Telephone Encounter (Signed)
Called and spoke with [name] [name] about pt's prednisone. Stated to her that at pt's last OV 06/11/21, [facility] had written the Rx to be a taper to begin weaning pt off of the prednisone. Stated that when the Rx was sent in, the instructions for pt to be taking a certain amount for each month and said to her when the Rx was sent in, we did have an additional refill on the Rx. [name] [name] verbalized understanding and said that she was going to call [facility] about the prednisone to see if there is still a refill left on the Rx and also stated to her that if needed, [facility] could call our office if they had any questions and she verbalized understanding.   Only medication change that is needing to be done is changing pt's trazodone Rx to clorazepate. Dr. Annamaria Boots, please advise.

## 2021-08-20 NOTE — Telephone Encounter (Signed)
Clorazepate sent to [facility]. Trazodone removed. Let me know what to do about prednisone when we hear from [name] [name].

## 2021-08-21 ENCOUNTER — Telehealth: Payer: Self-pay | Admitting: Internal Medicine

## 2021-08-21 NOTE — Telephone Encounter (Signed)
Spoke to patient's spouse, Kelly(DPR). She stated that she received a message from [facility] stating that Dr. Annamaria Boots did not approve clorazepate. According to our records clorazepate was sent to [facility] on 08/20/2021  Spoke to [place] with [facility]. Darlene is questioning if Trazodone 50mg  should be discontinued. Carlyon Shadow is aware that Tranxene should be at night only. She will process this through insurance.   Dr. Annamaria Boots, please advise on trazodone?

## 2021-08-21 NOTE — Telephone Encounter (Signed)
Deandra with [facility] is aware of below message and voiced his understanding.  Nothing further needed at this time.

## 2021-08-21 NOTE — Telephone Encounter (Signed)
Trazodone is dc'd.

## 2021-08-31 ENCOUNTER — Ambulatory Visit (INDEPENDENT_AMBULATORY_CARE_PROVIDER_SITE_OTHER): Payer: 59 | Admitting: Family

## 2021-08-31 ENCOUNTER — Encounter: Payer: Self-pay | Admitting: Family

## 2021-08-31 VITALS — BP 138/80 | HR 83 | Temp 98.4°F | Ht 70.0 in | Wt 289.0 lb

## 2021-08-31 DIAGNOSIS — Z1159 Encounter for screening for other viral diseases: Secondary | ICD-10-CM

## 2021-08-31 DIAGNOSIS — Z13220 Encounter for screening for lipoid disorders: Secondary | ICD-10-CM | POA: Diagnosis not present

## 2021-08-31 DIAGNOSIS — Z0000 Encounter for general adult medical examination without abnormal findings: Secondary | ICD-10-CM

## 2021-08-31 DIAGNOSIS — Z125 Encounter for screening for malignant neoplasm of prostate: Secondary | ICD-10-CM

## 2021-08-31 DIAGNOSIS — Z0184 Encounter for antibody response examination: Secondary | ICD-10-CM | POA: Diagnosis not present

## 2021-08-31 LAB — COMPREHENSIVE METABOLIC PANEL
ALT: 21 U/L (ref 0–53)
AST: 17 U/L (ref 0–37)
Albumin: 4.1 g/dL (ref 3.5–5.2)
Alkaline Phosphatase: 65 U/L (ref 39–117)
BUN: 19 mg/dL (ref 6–23)
CO2: 34 mEq/L — ABNORMAL HIGH (ref 19–32)
Calcium: 9.5 mg/dL (ref 8.4–10.5)
Chloride: 102 mEq/L (ref 96–112)
Creatinine, Ser: 1.15 mg/dL (ref 0.40–1.50)
GFR: 71.64 mL/min (ref >60.00)
Glucose, Bld: 93 mg/dL (ref 70–99)
Potassium: 4.5 mEq/L (ref 3.5–5.1)
Sodium: 142 mEq/L (ref 135–145)
Total Bilirubin: 0.6 mg/dL (ref 0.2–1.2)
Total Protein: 6.4 g/dL (ref 6.0–8.3)

## 2021-08-31 LAB — CBC WITH DIFFERENTIAL/PLATELET
Basophils Absolute: 0 10*3/uL (ref 0.0–0.1)
Basophils Relative: 0.3 % (ref 0.0–3.0)
Eosinophils Absolute: 0 10*3/uL (ref 0.0–0.7)
Eosinophils Relative: 0 % (ref 0.0–5.0)
HCT: 46 % (ref 39.0–52.0)
Hemoglobin: 14.6 g/dL (ref 13.0–17.0)
Lymphocytes Relative: 14.2 % (ref 12.0–46.0)
Lymphs Abs: 1.1 10*3/uL (ref 0.7–4.0)
MCHC: 31.8 g/dL (ref 30.0–36.0)
MCV: 90.9 fl (ref 78.0–100.0)
Monocytes Absolute: 0.6 10*3/uL (ref 0.1–1.0)
Monocytes Relative: 7.9 % (ref 3.0–12.0)
Neutro Abs: 5.8 10*3/uL (ref 1.4–7.7)
Neutrophils Relative %: 77.6 % — ABNORMAL HIGH (ref 43.0–77.0)
Platelets: 237 10*3/uL (ref 150.0–400.0)
RBC: 5.06 Mil/uL (ref 4.22–5.81)
RDW: 14.8 % (ref 11.5–15.5)
WBC: 7.5 10*3/uL (ref 4.0–10.5)

## 2021-08-31 LAB — LIPID PANEL
Cholesterol: 166 mg/dL (ref 0–200)
HDL: 42.8 mg/dL (ref >39.00)
LDL Cholesterol: 92 mg/dL (ref 0–99)
NonHDL: 122.8
Total CHOL/HDL Ratio: 4
Triglycerides: 152 mg/dL — ABNORMAL HIGH (ref 0.0–149.0)
VLDL: 30.4 mg/dL (ref 0.0–40.0)

## 2021-08-31 LAB — PSA: PSA: 3.25 ng/mL (ref 0.10–4.00)

## 2021-08-31 LAB — TSH: TSH: 0.78 u[IU]/mL (ref 0.35–5.50)

## 2021-08-31 MED ORDER — SILDENAFIL CITRATE 100 MG PO TABS: 100.0000 mg | ORAL_TABLET | Freq: Every day | ORAL | 3 refills | Status: DC | PRN

## 2021-08-31 NOTE — Progress Notes (Signed)
[name] [name] is a 56 y.o. male with the following history as recorded in EpicCare:  Patient Active Problem List   Diagnosis Date Noted   Sialadenitis 06/11/2021   Hx of adenomatous colonic polyps 04/06/2021   History of colon polyps 03/01/2020   Abnormal colonoscopy 03/01/2020   Recurrent urticaria/pruritus 09/21/2019   History of nasal polyposis 09/21/2019   Contact dermatitis 07/01/2019   Acute kidney injury ([place]) 08/08/2015   Hyperkalemia 08/08/2015   Acute pyelonephritis    Left ureteral stone 08/07/2015   Pyonephrosis 08/07/2015   Acute renal insufficiency 08/07/2015   Nonallopathic lesion of thoracic region 03/15/2015   Lateral epicondylitis of right elbow 02/22/2015   Obstructive sleep apnea 01/28/2015   Triceps tendinitis 01/11/2015   Back pain 12/21/2014   Anxiety state 02/06/2014   Thrush of mouth and esophagus ([place]) 06/20/2011   Sinusitis, maxillary, chronic 11/18/2010   ALLERGIC CONJUNCTIVITIS 08/25/2007   Perennial allergic rhinitis 08/25/2007   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     fluticasone (FLONASE) 50 MCG/ACT nasal spray SPRAY 2 SPRAYS INTO EACH NOSTRIL EVERY DAY 54 mL 3   ipratropium-albuterol (DUONEB) 0.5-2.5 (3) MG/3ML SOLN INHALE 1 VIAL VIA NEBULIZER 4 TIMES A DAY AS NEEDED 1080 mL 3   montelukast (SINGULAIR) 10 MG tablet Take 1 tablet (10 mg total) by mouth daily. 90 tablet 3   Nebulizers (COMPRESSOR NEBULIZER) MISC 1 Device by Does not apply route 4 (four) times daily as needed. 1 each 0   predniSONE (DELTASONE) 1 MG tablet 4 mg daily x 1 month, then  3 mg x 1 month, then 2 mg x 1 month, then 1 mg daily 250 tablet 1   PROVENTIL HFA 108 (90 Base) MCG/ACT inhaler Inhale 1-2 puffs into the lungs every 6 (six) hours as needed for wheezing or shortness of breath. 3 each 3   sildenafil (VIAGRA) 100 MG tablet Take 1 tablet (100 mg total) by mouth daily as needed for erectile dysfunction. 15 tablet 3   Spacer/[name]-Holding Chambers (AEROCHAMBER MV) inhaler Use as instructed 1 each 0   traZODone (DESYREL) 50 MG tablet Take 50 mg by mouth at bedtime.     clonazePAM (KLONOPIN) 1 MG tablet 1 OR 2 TABLETS BEFORE BED FOR SLEEP (Patient not taking: Reported on 08/31/2021) 30 tablet 5   clorazepate (TRANXENE) 7.5 MG tablet 1 or 2 for sleep as needed (Patient not taking: Reported on 08/31/2021) 180 tablet 1   predniSONE (DELTASONE) 5 MG tablet Take 5 mg by mouth daily with breakfast. (Patient not taking: Reported on 08/31/2021)     No current facility-administered medications for this visit.    Allergies: Patient has no known allergies.  Past Medical History:  Diagnosis Date   Allergic rhinitis    Allergy    seasonal allergies   Angio-edema    Cancer (HCC)    basal cell-nose   Complication of anesthesia    difficulty waking up from lithotripsy and nasal surgery   Glaucoma    has occasional pressure issues in eyes   History of chronic bronchitis    History of kidney stones  Left ureteral stone    Mild obstructive sleep apnea    per study 02-09-2015  recommended oral appliance / cpap-- pt tried intolerant   Moderate persistent asthma    pulmologist-  dr young   Pneumonia 1994   Urticaria     Past Surgical History:  Procedure Laterality Date   BIOPSY  07/18/2021   Procedure: BIOPSY;  Surgeon: Rush Landmark Telford Nab., MD;  Location: [facility];  Service: Gastroenterology;;   COLONOSCOPY  12/07/2019   polyps   COLONOSCOPY WITH PROPOFOL N/A 05/08/2020   Procedure: COLONOSCOPY WITH PROPOFOL;  Surgeon: Irving Copas., MD;  Location: [facility];  Service: Gastroenterology;  Laterality: N/A;   COLONOSCOPY WITH PROPOFOL N/A 07/18/2021   Procedure: COLONOSCOPY WITH PROPOFOL;  Surgeon: Rush Landmark Telford Nab., MD;  Location: [facility];  Service: Gastroenterology;  Laterality: N/A;   CYSTO/  LEFT URETERAL STENT PLACEMENT  03-10-2002   CYSTOSCOPY W/ URETERAL STENT PLACEMENT Left 08/07/2015   Procedure: CYSTOSCOPY, Left Retrograde Pyelogram, Left Ureteral Stent Placement;  Surgeon: Irine Seal, MD;  Location: [facility];  Service: Urology;  Laterality: Left;   CYSTOSCOPY/URETEROSCOPY/HOLMIUM LASER/STENT PLACEMENT Left 08/17/2015   Procedure: CYSTOSCOPY / LEFT URETEROSCOPY / HOLMIUM LASER LITHOTRIPSY;  Surgeon: Irine Seal, MD;  Location: [facility];  Service: Urology;  Laterality: Left;   ENDOSCOPIC MUCOSAL RESECTION N/A 05/08/2020   Procedure: ENDOSCOPIC MUCOSAL RESECTION;  Surgeon: Rush Landmark Telford Nab., MD;  Location: [facility];  Service: Gastroenterology;  Laterality: N/A;   ENDOSCOPIC MUCOSAL RESECTION N/A 07/18/2021   Procedure: ENDOSCOPIC MUCOSAL RESECTION;  Surgeon: Rush Landmark Telford Nab., MD;  Location: [facility];  Service: Gastroenterology;  Laterality: N/A;   EXTRACORPOREAL SHOCK WAVE LITHOTRIPSY  2006   HEMOSTASIS CLIP PLACEMENT  05/08/2020   Procedure: HEMOSTASIS CLIP PLACEMENT;  Surgeon: Irving Copas., MD;  Location: [facility];  Service: Gastroenterology;;   POLYPECTOMY  05/08/2020   Procedure: POLYPECTOMY;  Surgeon: Irving Copas., MD;  Location: [place];  Service: Gastroenterology;;   POLYPECTOMY  07/18/2021   Procedure: POLYPECTOMY;  Surgeon: Irving Copas., MD;  Location: [facility];  Service: Gastroenterology;;   SEPTOPLASTY WITH ETHMOIDECTOMY, AND MAXILLARY ANTROSTOMY  09-24-2010   SINOSCOPY     STONE EXTRACTION WITH BASKET Left 08/17/2015   Procedure: STONE EXTRACTION WITH BASKET;  Surgeon: Irine Seal, MD;  Location: [facility];  Service:  Urology;  Laterality: Left;   SUBMUCOSAL LIFTING INJECTION  05/08/2020   Procedure: SUBMUCOSAL LIFTING INJECTION;  Surgeon: Irving Copas., MD;  Location: [facility];  Service: Gastroenterology;;   WISDOM TOOTH EXTRACTION  1994    Family History  Problem Relation Age of Onset   Asthma Father    Other Father        bronchitis   Allergic rhinitis Father    Diabetes Maternal Grandfather 65       Heavy smoker   Eczema Neg Hx    Immunodeficiency Neg Hx    Urticaria Neg Hx    Colon polyps Neg Hx    Colon cancer Neg Hx    Esophageal cancer Neg Hx    Rectal cancer Neg Hx    Stomach cancer Neg Hx    Inflammatory bowel disease Neg Hx    Liver disease Neg Hx    Pancreatic cancer Neg Hx     Social History   Tobacco Use   Smoking status: Never   Smokeless tobacco: Never  Substance Use Topics   Alcohol use: Yes    Comment: socially  Subjective:   Presents today for yearly CPE; continuing to work with GI and pulmonology;  Planning to start exercising at [employer]- notes that stress at job is continuing to be very high and he "needs an outlet."  Review of Systems  Constitutional: Negative.   HENT: Negative.    Eyes: Negative.   Respiratory: Negative.    Cardiovascular: Negative.   Gastrointestinal: Negative.   Genitourinary: Negative.   Musculoskeletal: Negative.   Skin: Negative.   Neurological: Negative.   Endo/Heme/Allergies: Negative.   Psychiatric/Behavioral: Negative.        Objective:  Vitals:   08/31/21 1039  BP: 138/80  Pulse: 83  Temp: 98.4 F (36.9 C)  TempSrc: Oral  SpO2: 94%  Weight: 289 lb (131.1 kg)  Height: 5' 10"  (1.778 m)    General: Well developed, well nourished, in no acute distress  Skin : Warm and dry.  Head: Normocephalic and atraumatic  Eyes: Sclera and conjunctiva clear; pupils round and reactive to light; extraocular movements intact  Ears: External normal; canals clear; tympanic membranes normal  Oropharynx: Pink,  supple. No suspicious lesions  Neck: Supple without thyromegaly, adenopathy  Lungs: Respirations unlabored; clear to auscultation bilaterally without wheeze, rales, rhonchi  CVS exam: normal rate and regular rhythm.  Abdomen: Soft; nontender; nondistended; normoactive bowel sounds; no masses or hepatosplenomegaly  Musculoskeletal: No deformities; no active joint inflammation  Extremities: No edema, cyanosis, clubbing  Vessels: Symmetric bilaterally  Neurologic: Alert and oriented; speech intact; face symmetrical; moves all extremities well; CNII-XII intact without focal deficit  Assessment:  1. PE (physical exam), annual   2. Lipid screening   3. Prostate cancer screening   4. Immunity status testing   5. Need for hepatitis C screening test     Plan:  Age appropriate preventive healthcare needs addressed; encouraged regular eye doctor and dental exams; encouraged regular exercise and weight loss- he will see if he can meet with a trainer initially at gym/ start slow and work up; will update labs and refills as needed today; follow-up to be determined; Rx for Viagra- has used in the past with success; Will determine if he is eligible for Shingrix based on titer results and will need to get clearance from pulmonology due to current medications.   This visit occurred during the SARS-CoV-2 public health emergency.  Safety protocols were in place, including screening questions prior to the visit, additional usage of staff PPE, and extensive cleaning of exam room while observing appropriate contact time as indicated for disinfecting solutions.     No follow-ups on file.  Orders Placed This Encounter  Procedures   CBC with Differential/Platelet   Comp Met (CMET)   Lipid panel   TSH   PSA   Varicella zoster antibody, IgG   Hepatitis C Antibody    Requested Prescriptions   Signed Prescriptions Disp Refills   sildenafil (VIAGRA) 100 MG tablet 15 tablet 3    Sig: Take 1 tablet (100 mg  total) by mouth daily as needed for erectile dysfunction.

## 2021-09-03 LAB — VARICELLA ZOSTER ANTIBODY, IGG: Varicella IgG: 667.5 index

## 2021-09-03 LAB — HEPATITIS C ANTIBODY
Hepatitis C Ab: NONREACTIVE
SIGNAL TO CUT-OFF: 0.09 (ref <1.00)

## 2021-09-25 ENCOUNTER — Telehealth: Payer: Self-pay

## 2021-09-25 NOTE — Telephone Encounter (Signed)
PA renewal initiated automatically by CoverMyMeds. ? ?Submitted a Prior Authorization request to [facility] for [facility] via CoverMyMeds. Will update once we receive a response. ? ? ?Key: [id number] ?

## 2021-09-25 NOTE — Telephone Encounter (Signed)
Received notification from [facility] regarding a prior authorization for [facility]. Authorization has been APPROVED from 09/25/2021 to 09/26/2022. Approval letter sent to scan center. ? ?Authorization # [id number] ?

## 2021-10-08 ENCOUNTER — Other Ambulatory Visit: Payer: Self-pay | Admitting: *Deleted

## 2021-10-08 MED ORDER — PREDNISONE 1 MG PO TABS: 1.0000 mg | ORAL_TABLET | Freq: Every day | ORAL | 3 refills | Status: DC

## 2021-10-12 NOTE — Progress Notes (Signed)
? Patient ID: [name] [name], male    DOB: April 07, 1966, 56 y.o.   MRN: [id number] ? ?HPI ?male never smoker followed for chronic obstructive asthma, OSA/quit CPAP, rhinitis/chronic sinusitis ?Unattended Home Sleep Test 02/09/15- mild OSA/ AHI 9.3/ hr, desat to 85%, weight 276 lbs ?PFT 03/06/2007 ?PFT 01/29/2011 ?Office Spirometry 02/03/2014-severe obstructive airways disease with low FVC.  FVC 3.13/62%, FEV1 2.01/50%, ratio 0.64, ? FEF 25-75% 0.87/22% ?CT sinus 11/23/2009-moderate chronic appearing pansinusitis. Surgery 09/24/2010-polypoid chronic sinusitis ?CBC with differential 12/28/2015-eosinophils 7.3% ?CBC w diff 09/28/2018- steroid pattern EOS 0.2 k/ul ?FENO 10/02/16- 33 H ?Office spirometry 10/02/16-moderate restriction and obstruction. FVC 2.66/53%, FEV1 2.0/51%, ratio 0.75, FEF 25-75% 1.60/46% ?IgE 10/02/2016-295 ?PFT 01/29/2019- Mod obst, mild restrction, slight response to dilator, normal Diffusion ?Allergy Skin Testing 09/21/19- Dr Verlin Fester- + dust mites ?-------------------------------------------------------------------------------------- ? ? ?11/41/43-  56 year old male never smoker followed for chronic obstructive asthma        (Asthma- COPD Overlap Syndrome), OSA/quit CPAP, rhinitis/chronic polypoid sinusitis, Urticaria, Colon polyps, ?-Prednisone 5 mg, singulair, xyzal, neb Duoneb, Allegra, Ventolin hfa, Symbicort 160, Fasenra, clonazepam,  ?Covid vax 3 Phizer ?Flu vax- today ? -----Patient feels like breathing is good since last visit. Has been having some swelling in glands in his neck. ?Body weight today- ?ACT score- 22 ?Reports COVID infection this summer treated and rapidly recovered with molnupiravir. ?Has felt really stable from a breathing standpoint over the past year, using rescue inhaler only twice in the past month or so.  Feels [name] maybe does not control him quite as solidly as it did at the beginning but with no significant breakthrough.  Using Symbicort once daily. ?Left submandibular gland  occasionally swells, tender and itches.  Putting pressure on it squirts saliva into his mouth, then it feels better.  Rarely gland in same location on other side of neck does the same.  I suggested ENT if this happens again. ?We discussed trying to taper off of prednisone. ? ?10/15/21- 56 year old male never smoker followed for chronic obstructive asthma        (Asthma- COPD Overlap Syndrome), OSA/quit CPAP, rhinitis/chronic polypoid sinusitis, Urticaria, Colon polyps, ?-Prednisone 5 mg, singulair, xyzal, neb Duoneb, Allegra, Ventolin hfa, Symbicort 160, Fasenra, clonazepam,  ?Covid vax 3 Phizer ?Flu vax- had ?-----Patient is doing good, no concerns ?ACT score 21 ?Daily nebulizer but not needing rescue inhaler. Going to gym.  Continues Symbicort. ?He credits [name] with substantially improved asthma control compared with a few years ago.  Still taking prednisone one half of a 5 mg tab daily.  We discussed tapering. ?Continues daily Allegra but says rhinitis and sinusitis symptoms are controlled. ?Still expresses saliva from salivary gland under left mandible at times.  I recommended ENT if it gets bothersome. ? ?Review of Systems-See HPI       + = positive ?Constitutional:    No- night sweats ,fevers, chills, fatigue, lassitude. ?HEENT:   No - headaches,  Difficulty swallowing, Tooth/dental problems, Sore throat,  ?              No sneezing, itching, ear ache, ?CV:  No chest pain,  Orthopnea, PND, swelling in lower extremities, anasarca, dizziness, palpitations ?GI  No heartburn, indigestion, abdominal pain, nausea, vomiting,  ?Resp:.See HPI    productive cough   No coughing up of blood.  change in color of mucus,  + wheeze ?Skin: + HPI ?GU: . ?MS:  No joint pain or swelling.  [name] ?Psych:   change in mood or affect.  depression and  anxiety.  No memory loss. ? ? ?Objective:  ? Physical Exam ?General- Alert, Oriented, Affect-appropriate, Distress- none acute, + Obese / thick neck ?Skin- + birthmark R  hand ?Lymphadenopathy- none ?Head- atraumatic ?           Eyes- Gross vision intact, PERRLA, conjunctivae clear secretions ?           Ears- Hearing, canals normal ?           Nose- Clear, no-Septal dev, mucus, polyps, erosion, perforation  ?           Throat- Mallampati  III- IV , mucosa+ geographic, drainage- none, tonsils- atrophic. , ?Neck- flexible , trachea midline, no stridor , thyroid nl, carotid no bruit.  No obvious tenderness or mass. ?Chest - symmetrical excursion , unlabored ?          Heart/CV- RRR , no murmur , no gallop  , no rub, nl s1 s2 ?                          - JVD- none , edema- none, stasis changes- none, varices- none ?          Lung-  Wheeze-none, clear, unlabored,   cough -None , dullness-none, rub- none ?          Chest wall-  ?Abd-  ?Br/ Gen/ Rectal- Not done, not indicated ?Extrem- cyanosis- none, clubbing, none, atrophy- none, strength- nl ?Neuro- grossly intact to observation ? ? ? ? ?   ? ? ? ? ?

## 2021-10-15 ENCOUNTER — Ambulatory Visit (INDEPENDENT_AMBULATORY_CARE_PROVIDER_SITE_OTHER): Payer: 59 | Admitting: Internal Medicine

## 2021-10-15 ENCOUNTER — Other Ambulatory Visit: Payer: Self-pay

## 2021-10-15 ENCOUNTER — Encounter: Payer: Self-pay | Admitting: Internal Medicine

## 2021-10-15 DIAGNOSIS — J449 Chronic obstructive pulmonary disease, unspecified: Secondary | ICD-10-CM

## 2021-10-15 DIAGNOSIS — K1120 Sialoadenitis, unspecified: Secondary | ICD-10-CM

## 2021-10-15 NOTE — Patient Instructions (Signed)
Glad you are doing so well. ? ?We can continue current meds. Please call if we can help. ?

## 2021-10-16 ENCOUNTER — Encounter: Payer: Self-pay | Admitting: Internal Medicine

## 2021-10-16 NOTE — Assessment & Plan Note (Signed)
Much better control with [name] ?Plan-continue very slow prednisone taper as able.  Continue Symbicort and Fasenra.  Refills when needed. ?

## 2021-10-16 NOTE — Assessment & Plan Note (Signed)
This will be managed by ENT if intervention needed ?

## 2021-10-17 ENCOUNTER — Telehealth: Payer: Self-pay | Admitting: Internal Medicine

## 2021-10-17 ENCOUNTER — Telehealth: Payer: Self-pay

## 2021-10-17 MED ORDER — PROVENTIL HFA 108 (90 BASE) MCG/ACT IN AERS: 1.0000 | INHALATION_SPRAY | Freq: Four times a day (QID) | RESPIRATORY_TRACT | 3 refills | Status: DC | PRN

## 2021-10-17 NOTE — Telephone Encounter (Signed)
Medication sent [facility] . Nothing further needed. ?

## 2021-10-17 NOTE — Telephone Encounter (Signed)
Medication of albuterol sent over to [facility] mail delivery  ?

## 2021-11-13 ENCOUNTER — Other Ambulatory Visit: Payer: Self-pay | Admitting: Internal Medicine

## 2021-11-13 DIAGNOSIS — J4550 Severe persistent asthma, uncomplicated: Secondary | ICD-10-CM

## 2021-11-13 NOTE — Telephone Encounter (Signed)
Refill sent for [facility] to [facility]: [phone]  ? ?Dose: 30 mg SQ every 8 weeks ? ?Last OV: 10/15/21 ?Provider: Dr. Annamaria Boots ? ?Next OV: 6 months (September 2023) ? ?Knox Saliva, PharmD, MPH, BCPS ?Clinical Pharmacist (Rheumatology and Pulmonology) ? ?

## 2021-11-16 ENCOUNTER — Telehealth: Payer: Self-pay | Admitting: Internal Medicine

## 2021-11-19 NOTE — Telephone Encounter (Signed)
Returned call to patient's wife - she states pharmacy told me the copay was $4000. States our office helped patient into grant. I advised that it was likely a copay card and not grant since the [employer] does not offer grants. ? ?Provided copay card information to [place]. Rep  ? ?Fasenra copay card per portal: ? ?Group: [id number] ?BIN: [id number] ?PCN: [id number] ?ID:  [id number] ? ?Conference called patient's wife with pharmacy rep. Shipment scheduled for 11/21/21 ? ?Knox Saliva, PharmD, MPH, BCPS, CPP ?Clinical Pharmacist (Rheumatology and Pulmonology) ?

## 2021-12-28 ENCOUNTER — Encounter: Payer: Self-pay | Admitting: Internal Medicine

## 2021-12-28 NOTE — Telephone Encounter (Signed)
Received the following message from patient:   "Wanted to get a question to Dr Annamaria Boots. Dr Annamaria Boots has been wanting me to come off Prednisone for quite a while. I recently have gone from 5 mg a day down to 2.5 mg breaking the 5 mg tablets in half. I have received the 1 mg tablets to go to the next step of taking less everyday. Dr Annamaria Boots told me that it was possible that certain glands in my body that made the cortisol might not want to wake up after being on Prednisone for so long. About 2 weeks after I started taking the 2.5 mg I started noticing numerous aches and pains in my joints that I had never experienced before. I attributed it to my new exercise regiment at the gym. However the pain is showing up in different joints at different times even when not exercising. It'll be in my knee one day, and then in the joint of a certain finger another day and then it will be in an elbow another day. Wanted him to advise me if I was experiencing prednisone withdrawal? I did take an additional tablet for 2 days to up my prednisone intake to 3.5 mg and the pain seen to subside some. Warning his advice on what might be going on with this recent joint pain then I'm not attributing completely to my new exercise regiment at the gym. Pain mostly experienced in my knees, or my hip, or the joints of certain fingers and occasionally in my elbow. Thank you [name] [name]."  Dr. Annamaria Boots, can you please advise? Thanks!

## 2021-12-30 NOTE — Telephone Encounter (Signed)
We can back off on taper for a while to see what happens.  Suggest alternate 5 mg with 2.5 mg every other day. After 2 weeks, try taking 5 mg one day, then 2.5 mg for next 2 days, then back to 5 mg and alternate lke that. Likely will need to keep track on a calendar.  Some aching might be from normal age-related joint pain that was being hidden by steroids.  Let us know how you feel in a month.

## 2021-12-31 NOTE — Telephone Encounter (Signed)
Dr. Annamaria Boots, please advise. Thanks   And the pain has virtually gone away. Except for the usual aches and pains I was normally having before. Would you suggest that I just stay on the 3 mg per day and see what happens? Or follow the regiment as prescribed earlier?    Unknown [facility] (supporting Deneise Lever, MD) 6 hours ago (9:33 AM)    I do not have any of the 5 mg tablets remaining. However I do have the 1 mg tablets. I took three of the 1 mg tablets starting Friday evening

## 2021-12-31 NOTE — Telephone Encounter (Signed)
Try staying on 3 mg/ day for now, since that would be convenient using 1 mg tabs. Let's see how you do.

## 2022-01-19 ENCOUNTER — Other Ambulatory Visit: Payer: Self-pay | Admitting: Internal Medicine

## 2022-01-21 NOTE — Telephone Encounter (Signed)
Dr. Annamaria Boots, please advise on med refill.   No Known Allergies   Current Outpatient Medications:    budesonide-formoterol (SYMBICORT) 160-4.5 MCG/ACT inhaler, INHALE 2 PUFFS INTO THE LUNGS TWICE A DAY RINSE MOUTH AFTER USE, Disp: 30.6 each, Rfl: 3   clonazePAM (KLONOPIN) 1 MG tablet, 1 OR 2 TABLETS BEFORE BED FOR SLEEP (Patient not taking: Reported on 08/31/2021), Disp: 30 tablet, Rfl: 5   clorazepate (TRANXENE) 7.5 MG tablet, 1 or 2 for sleep as needed (Patient not taking: Reported on 08/31/2021), Disp: 180 tablet, Rfl: 1   FASENRA PEN 30 MG/ML SOAJ, INJECT '30MG'$  SUBCUTANEOUSLY EVERY 8 WEEKS, Disp: 1 mL, Rfl: 2   fexofenadine (ALLEGRA) 180 MG tablet, Take 90 mg by mouth daily., Disp: , Rfl:    fluticasone (FLONASE) 50 MCG/ACT nasal spray, SPRAY 2 SPRAYS INTO EACH NOSTRIL EVERY DAY, Disp: 54 mL, Rfl: 3   ipratropium-albuterol (DUONEB) 0.5-2.5 (3) MG/3ML SOLN, INHALE 1 VIAL VIA NEBULIZER 4 TIMES A DAY AS NEEDED, Disp: 1080 mL, Rfl: 3   montelukast (SINGULAIR) 10 MG tablet, Take 1 tablet (10 mg total) by mouth daily., Disp: 90 tablet, Rfl: 3   Nebulizers (COMPRESSOR NEBULIZER) MISC, 1 Device by Does not apply route 4 (four) times daily as needed., Disp: 1 each, Rfl: 0   predniSONE (DELTASONE) 1 MG tablet, Take 1 tablet (1 mg total) by mouth daily with breakfast., Disp: 90 tablet, Rfl: 3   PROVENTIL HFA 108 (90 Base) MCG/ACT inhaler, Inhale 1-2 puffs into the lungs every 6 (six) hours as needed for wheezing or shortness of breath., Disp: 3 each, Rfl: 3   sildenafil (VIAGRA) 100 MG tablet, Take 1 tablet (100 mg total) by mouth daily as needed for erectile dysfunction., Disp: 15 tablet, Rfl: 3   Spacer/[name]-Holding Chambers (AEROCHAMBER MV) inhaler, Use as instructed, Disp: 1 each, Rfl: 0   traZODone (DESYREL) 50 MG tablet, Take 50 mg by mouth at bedtime., Disp: , Rfl:

## 2022-01-24 ENCOUNTER — Telehealth: Payer: Self-pay | Admitting: Internal Medicine

## 2022-01-24 MED ORDER — PREDNISONE 5 MG PO TABS: ORAL_TABLET | ORAL | 5 refills | Status: DC

## 2022-01-24 NOTE — Telephone Encounter (Signed)
Called and let patient know that refill of his medication was sent in. He expressed understanding and also wanted me to send a message to Dr. Annamaria Boots to let him know that he has been taking the 1 mg tablet of Prednisone and was on the 3 mg tablets. He would like to see if he can switch back to the 5 mg tablets at he has been having a lot of pain that he was not having before.   Please advise

## 2022-01-24 NOTE — Telephone Encounter (Signed)
Chlorazepate refilled

## 2022-01-24 NOTE — Telephone Encounter (Signed)
Prednisone refilled

## 2022-01-25 NOTE — Telephone Encounter (Signed)
Called and spoke with patient and his wife to let them know that his refills have been sent in. They expressed understanding. Nothing further needed at this time.

## 2022-02-04 ENCOUNTER — Telehealth: Payer: Self-pay | Admitting: Internal Medicine

## 2022-02-04 NOTE — Telephone Encounter (Signed)
Patient's wife states that prednisone prescription was not received from mail in pharmacy or it was canceled. Patient needs prednisone '5MG'$  refilled to mail in pharmacy.  Please advise, call back number for wife, [phone].

## 2022-02-04 NOTE — Telephone Encounter (Signed)
Called and spoke with pt's spouse Claiborne Billings who states that they were told that a fax had been sent to [facility] for pt's prednisone but then they found out that the request had been cancelled by [facility]. Fax has been received from [facility] requesting additional information. This has been handed to [facility] for him to review.

## 2022-03-04 ENCOUNTER — Telehealth: Payer: Self-pay | Admitting: Internal Medicine

## 2022-03-04 MED ORDER — PROVENTIL HFA 108 (90 BASE) MCG/ACT IN AERS: 1.0000 | INHALATION_SPRAY | Freq: Four times a day (QID) | RESPIRATORY_TRACT | 3 refills | Status: DC | PRN

## 2022-03-04 NOTE — Telephone Encounter (Signed)
Called and spoke to spouse about medication. Wife states that she is needing medication to be sent to a different pharmacy due to [facility] being out. Medication sent in. Nothing further needed

## 2022-03-04 NOTE — Telephone Encounter (Signed)
Patient's wife is calling to see if they could get a prescription for the Albuterol solution to be sent to the [facility] on [address] in [place] since the [facility] is out. Wants to know if he could get a 3 month supply that way it would last until [facility] gets it in.

## 2022-03-05 ENCOUNTER — Telehealth: Payer: Self-pay | Admitting: Internal Medicine

## 2022-03-05 MED ORDER — IPRATROPIUM-ALBUTEROL 0.5-2.5 (3) MG/3ML IN SOLN: RESPIRATORY_TRACT | 3 refills | Status: DC

## 2022-03-05 NOTE — Telephone Encounter (Signed)
Called and spoke to patients wife. And did confirm the pharmacy. Nothing further needed

## 2022-03-26 ENCOUNTER — Telehealth: Payer: Self-pay | Admitting: Internal Medicine

## 2022-03-26 DIAGNOSIS — J4550 Severe persistent asthma, uncomplicated: Secondary | ICD-10-CM

## 2022-03-26 NOTE — Telephone Encounter (Signed)
Refill sent for [facility] to [facility]: [phone]  Dose: 30 mg SQ every 8 weeks  Last OV: 10/15/21 Provider: Dr. Annamaria Boots  Next OV: should be this month but not scheduled. Routing to scheduling team  Knox Saliva, PharmD, MPH, BCPS Clinical Pharmacist (Rheumatology and Pulmonology)

## 2022-03-29 NOTE — Telephone Encounter (Signed)
Pt scheduled for 10/2 with CY.

## 2022-04-17 ENCOUNTER — Other Ambulatory Visit: Payer: Self-pay | Admitting: Internal Medicine

## 2022-04-18 NOTE — Telephone Encounter (Signed)
Symbicort refilled.

## 2022-04-20 NOTE — Progress Notes (Unsigned)
Patient ID: [name] [name], male    DOB: [birth year], 56 y.o.   MRN: [id number]  HPI male never smoker followed for chronic obstructive asthma, OSA/quit CPAP, rhinitis/chronic sinusitis Unattended Home Sleep Test 02/09/15- mild OSA/ AHI 9.3/ hr, desat to 85%, weight 276 lbs PFT 03/06/2007 PFT 01/29/2011 Office Spirometry 02/03/2014-severe obstructive airways disease with low FVC.  FVC 3.13/62%, FEV1 2.01/50%, ratio 0.64,  FEF 25-75% 0.87/22% CT sinus 11/23/2009-moderate chronic appearing pansinusitis. Surgery 09/24/2010-polypoid chronic sinusitis CBC with differential 12/28/2015-eosinophils 7.3% CBC w diff 09/28/2018- steroid pattern EOS 0.2 k/ul FENO 10/02/16- 33 H Office spirometry 10/02/16-moderate restriction and obstruction. FVC 2.66/53%, FEV1 2.0/51%, ratio 0.75, FEF 25-75% 1.60/46% IgE 10/02/2016-295 PFT 01/29/2019- Mod obst, mild restrction, slight response to dilator, normal Diffusion Allergy Skin Testing 09/21/19- Dr Verlin Fester- + dust mites --------------------------------------------------------------------------------------   10/15/21- 56 year old male never smoker followed for chronic obstructive asthma        (Asthma- COPD Overlap Syndrome), OSA/quit CPAP, rhinitis/chronic polypoid sinusitis, Urticaria, Colon polyps, -Prednisone 5 mg, singulair, xyzal, neb Duoneb, Allegra, Ventolin hfa, Symbicort 160, Fasenra, clonazepam,  Covid vax 3 Phizer Flu vax- had -----Patient is doing good, no concerns ACT score 21 Daily nebulizer but not needing rescue inhaler. Going to gym.  Continues Symbicort. He credits [name] with substantially improved asthma control compared with a few years ago.  Still taking prednisone one half of a 5 mg tab daily.  We discussed tapering. Continues daily Allegra but says rhinitis and sinusitis symptoms are controlled. Still expresses saliva from salivary gland under left mandible at times.  I recommended ENT if it gets bothersome.  04/22/22- 56 year old male never smoker  followed for chronic obstructive asthma  (Asthma- COPD Overlap Syndrome), OSA/quit CPAP, rhinitis/chronic polypoid sinusitis, Urticaria, Colon polyps, -Prednisone 5 mg, singulair, xyzal, neb Duoneb, Allegra, Ventolin hfa, Symbicort 160, Fasenra, clonazepam,  Covid vax 3 Phizer Flu vax-today standard ------Pt f/u for asthma, breathing is fine.  Tried to get more exercise- has improved tone and lost some weight. Noticing more aches and pains after exercise, mitigated by taking 5 mg prednisone daily. Discussed unmasking arthritis vs adrenal insufficiency. Will refer to Endocrinology with question of steroid induced adrenal insufficiency. He is very pleased that with [name] he has had much less airway trouble/ colds/ asthma. Having some L hip soreness. I asked he discuss this with PCP- arthritis, possible aseptic necrosis.  Review of Systems-See HPI       + = positive Constitutional:    No- night sweats ,fevers, chills, fatigue, lassitude. HEENT:   No - headaches,  Difficulty swallowing, Tooth/dental problems, Sore throat,                No sneezing, itching, ear ache, CV:  No chest pain,  Orthopnea, PND, swelling in lower extremities, anasarca, dizziness, palpitations GI  No heartburn, indigestion, abdominal pain, nausea, vomiting,  Resp:.See HPI    productive cough   No coughing up of blood.  change in color of mucus,  + wheeze Skin: + HPI GU: . MS:  No joint pain or swelling.  [name] Psych:   change in mood or affect.  depression and  anxiety.  No memory loss.   Objective:   Physical Exam General- Alert, Oriented, Affect-appropriate, Distress- none acute, + Obese / thick neck Skin- + birthmark R hand Lymphadenopathy- none Head- atraumatic            Eyes- Gross vision intact, PERRLA, conjunctivae clear secretions            Ears-  Hearing, canals normal            Nose- Clear, no-Septal dev, mucus, polyps, erosion, perforation             Throat- Mallampati  III- IV , mucosa+ geographic,  drainage- none, tonsils- atrophic. , Neck- flexible , trachea midline, no stridor , thyroid nl, carotid no bruit.  No obvious tenderness or mass. Chest - symmetrical excursion , unlabored           Heart/CV- RRR , no murmur , no gallop  , no rub, nl s1 s2                           - JVD- none , edema- none, stasis changes- none, varices- none           Lung-  Wheeze-none, clear, unlabored,   cough -None , dullness-none, rub- none           Chest wall-  Abd-  Br/ Gen/ Rectal- Not done, not indicated Extrem- cyanosis- none, clubbing, none, atrophy- none, strength- nl Neuro- grossly intact to observation

## 2022-04-22 ENCOUNTER — Ambulatory Visit (INDEPENDENT_AMBULATORY_CARE_PROVIDER_SITE_OTHER): Payer: 59 | Admitting: Internal Medicine

## 2022-04-22 ENCOUNTER — Encounter: Payer: Self-pay | Admitting: Internal Medicine

## 2022-04-22 VITALS — BP 120/78 | HR 77 | Ht 70.0 in | Wt 277.8 lb

## 2022-04-22 DIAGNOSIS — J4489 Other specified chronic obstructive pulmonary disease: Secondary | ICD-10-CM

## 2022-04-22 DIAGNOSIS — E2740 Unspecified adrenocortical insufficiency: Secondary | ICD-10-CM | POA: Diagnosis not present

## 2022-04-22 DIAGNOSIS — Z23 Encounter for immunization: Secondary | ICD-10-CM

## 2022-04-22 DIAGNOSIS — T380X5A Adverse effect of glucocorticoids and synthetic analogues, initial encounter: Secondary | ICD-10-CM | POA: Insufficient documentation

## 2022-04-22 DIAGNOSIS — N201 Calculus of ureter: Secondary | ICD-10-CM

## 2022-04-22 DIAGNOSIS — E273 Drug-induced adrenocortical insufficiency: Secondary | ICD-10-CM

## 2022-04-22 NOTE — Assessment & Plan Note (Signed)
Dramatic improvement since he has been on [place] hope eventually he can get off supplemental steroids. Plan- continue [employer]

## 2022-04-22 NOTE — Patient Instructions (Signed)
Order- refer to Endocrinology    dx Possible Adrenal Insufficiency  Order- Flu vax- standard  Please call if we can help

## 2022-04-22 NOTE — Assessment & Plan Note (Signed)
Aches and pains if tries to exercise when taking less than 5 mg prednisone daily Plan- refer to Endocrinology. He should discuss any specific persistent pains with his PCP.

## 2022-05-02 ENCOUNTER — Ambulatory Visit: Payer: 59 | Admitting: Family

## 2022-05-07 ENCOUNTER — Ambulatory Visit: Payer: 59 | Admitting: Family

## 2022-05-16 ENCOUNTER — Ambulatory Visit (HOSPITAL_BASED_OUTPATIENT_CLINIC_OR_DEPARTMENT_OTHER)
Admission: RE | Admit: 2022-05-16 | Discharge: 2022-05-16 | Disposition: A | Discharge status: Home / Self Care | Payer: 59 | Source: Ambulatory Visit | Attending: Family | Admitting: Family

## 2022-05-16 ENCOUNTER — Ambulatory Visit (INDEPENDENT_AMBULATORY_CARE_PROVIDER_SITE_OTHER): Payer: 59 | Admitting: Family

## 2022-05-16 VITALS — BP 134/80 | HR 87 | Temp 99.0°F | Resp 20 | Ht 70.0 in | Wt 283.0 lb

## 2022-05-16 DIAGNOSIS — M545 Low back pain, unspecified: Secondary | ICD-10-CM | POA: Insufficient documentation

## 2022-05-16 DIAGNOSIS — M25552 Pain in left hip: Secondary | ICD-10-CM | POA: Diagnosis present

## 2022-05-16 MED ORDER — METHOCARBAMOL 500 MG PO TABS: 500.0000 mg | ORAL_TABLET | Freq: Three times a day (TID) | ORAL | 0 refills | Status: DC | PRN

## 2022-05-16 MED ORDER — SILDENAFIL CITRATE 100 MG PO TABS: 50.0000 mg | ORAL_TABLET | Freq: Every day | ORAL | 2 refills | Status: AC | PRN

## 2022-05-16 NOTE — Progress Notes (Signed)
[name] [name] is a 56 y.o. 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) 04/22/2022   Sialadenitis 06/11/2021   Hx of adenomatous colonic polyps 04/06/2021   History of colon polyps 03/01/2020   Abnormal colonoscopy 03/01/2020   Recurrent urticaria/pruritus 09/21/2019   History of nasal polyposis 09/21/2019   Contact dermatitis 07/01/2019   Acute kidney injury ([place]) 08/08/2015   Hyperkalemia 08/08/2015   Acute pyelonephritis    Left ureteral stone 08/07/2015   Pyonephrosis 08/07/2015   Acute renal insufficiency 08/07/2015   Nonallopathic lesion of thoracic region 03/15/2015   Lateral epicondylitis of right elbow 02/22/2015   Obstructive sleep apnea 01/28/2015   Triceps tendinitis 01/11/2015   Back pain 12/21/2014   Anxiety state 02/06/2014   Thrush of mouth and esophagus ([place]) 06/20/2011   Sinusitis, maxillary, chronic 11/18/2010   ALLERGIC CONJUNCTIVITIS 08/25/2007   Perennial allergic rhinitis 08/25/2007   Asthma-chronic obstructive pulmonary disease overlap syndrome 08/25/2007    Current Outpatient Medications  Medication Sig Dispense Refill   Benralizumab (FASENRA PEN) 30 MG/ML SOAJ INJECT '30MG'$  SUBCUTANEOUSLY EVERY 8 WEEKS 1 mL 0   budesonide-formoterol (SYMBICORT) 160-4.5 MCG/ACT inhaler USE 2 INHALATIONS BY MOUTH TWICE DAILY ; RINSE MOUTH AFTER USE 30.6 g 3   clonazePAM (KLONOPIN) 1 MG tablet 1 OR 2 TABLETS BEFORE BED FOR SLEEP 30 tablet 5   clorazepate (TRANXENE) 7.5 MG tablet TAKE 1 TO 2 TABLETS BY MOUTH AT  NIGHT AS NEEDED FOR SLEEP 180 tablet 1   fexofenadine (ALLEGRA) 180 MG tablet Take 90 mg by mouth daily.     fluticasone (FLONASE) 50 MCG/ACT nasal spray SPRAY 2 SPRAYS INTO EACH NOSTRIL EVERY DAY 54 mL 3   ipratropium-albuterol (DUONEB) 0.5-2.5 (3) MG/3ML SOLN INHALE 1 VIAL VIA NEBULIZER 4 TIMES A DAY AS NEEDED 1080 mL 3   methocarbamol  (ROBAXIN) 500 MG tablet Take 1 tablet (500 mg total) by mouth every 8 (eight) hours as needed for muscle spasms. 30 tablet 0   montelukast (SINGULAIR) 10 MG tablet Take 1 tablet (10 mg total) by mouth daily. 90 tablet 3   Nebulizers (COMPRESSOR NEBULIZER) MISC 1 Device by Does not apply route 4 (four) times daily as needed. 1 each 0   Omega-3 Fatty Acids (FISH OIL) 1200 MG CAPS Take 1,200 mg by mouth daily.     predniSONE (DELTASONE) 5 MG tablet As directed- minimal effective dose 60 tablet 5   PROVENTIL HFA 108 (90 Base) MCG/ACT inhaler Inhale 1-2 puffs into the lungs every 6 (six) hours as needed for wheezing or shortness of breath. 3 each 3   sildenafil (VIAGRA) 100 MG tablet Take 1 tablet (100 mg total) by mouth daily as needed for erectile dysfunction. 15 tablet 3   sildenafil (VIAGRA) 100 MG tablet Take 0.5-1 tablets (50-100 mg total) by mouth daily as needed for erectile dysfunction. 5 tablet 2   Spacer/[name]-Holding Chambers (AEROCHAMBER MV) inhaler Use as instructed 1 each 0   traZODone (DESYREL) 50 MG tablet Take 50 mg by mouth at bedtime.     Turmeric (QC TUMERIC COMPLEX PO) Take by mouth.     No current facility-administered medications for this visit.    Allergies: Patient has no known allergies.  Past Medical History:  Diagnosis Date   Allergic rhinitis    Allergy    seasonal allergies   Angio-edema    Cancer ([place])    basal  cell-nose   Complication of anesthesia    difficulty waking up from lithotripsy and nasal surgery   Glaucoma    has occasional pressure issues in eyes   History of chronic bronchitis    History of kidney stones    Left ureteral stone    Mild obstructive sleep apnea    per study 02-09-2015  recommended oral appliance / cpap-- pt tried intolerant   Moderate persistent asthma    pulmologist-  dr young   Pneumonia 1994   Urticaria     Past Surgical History:  Procedure Laterality Date   BIOPSY  07/18/2021   Procedure: BIOPSY;  Surgeon: Irving Copas., MD;  Location: [facility];  Service: Gastroenterology;;   COLONOSCOPY  12/07/2019   polyps   COLONOSCOPY WITH PROPOFOL N/A 05/08/2020   Procedure: COLONOSCOPY WITH PROPOFOL;  Surgeon: Irving Copas., MD;  Location: [facility];  Service: Gastroenterology;  Laterality: N/A;   COLONOSCOPY WITH PROPOFOL N/A 07/18/2021   Procedure: COLONOSCOPY WITH PROPOFOL;  Surgeon: Rush Landmark Telford Nab., MD;  Location: [facility];  Service: Gastroenterology;  Laterality: N/A;   CYSTO/  LEFT URETERAL STENT PLACEMENT  03-10-2002   CYSTOSCOPY W/ URETERAL STENT PLACEMENT Left 08/07/2015   Procedure: CYSTOSCOPY, Left Retrograde Pyelogram, Left Ureteral Stent Placement;  Surgeon: Irine Seal, MD;  Location: [facility];  Service: Urology;  Laterality: Left;   CYSTOSCOPY/URETEROSCOPY/HOLMIUM LASER/STENT PLACEMENT Left 08/17/2015   Procedure: CYSTOSCOPY / LEFT URETEROSCOPY / HOLMIUM LASER LITHOTRIPSY;  Surgeon: Irine Seal, MD;  Location: [facility];  Service: Urology;  Laterality: Left;   ENDOSCOPIC MUCOSAL RESECTION N/A 05/08/2020   Procedure: ENDOSCOPIC MUCOSAL RESECTION;  Surgeon: Rush Landmark Telford Nab., MD;  Location: [place];  Service: Gastroenterology;  Laterality: N/A;   ENDOSCOPIC MUCOSAL RESECTION N/A 07/18/2021   Procedure: ENDOSCOPIC MUCOSAL RESECTION;  Surgeon: Rush Landmark Telford Nab., MD;  Location: [facility];  Service: Gastroenterology;  Laterality: N/A;   EXTRACORPOREAL SHOCK WAVE LITHOTRIPSY  2006   HEMOSTASIS CLIP PLACEMENT  05/08/2020   Procedure: HEMOSTASIS CLIP PLACEMENT;  Surgeon: Irving Copas., MD;  Location: [place];  Service: Gastroenterology;;   POLYPECTOMY  05/08/2020   Procedure: POLYPECTOMY;  Surgeon: Irving Copas., MD;  Location: [place];  Service: Gastroenterology;;   POLYPECTOMY  07/18/2021   Procedure: POLYPECTOMY;  Surgeon: Irving Copas., MD;  Location: [facility];  Service: Gastroenterology;;    SEPTOPLASTY WITH ETHMOIDECTOMY, AND MAXILLARY ANTROSTOMY  09-24-2010   SINOSCOPY     STONE EXTRACTION WITH BASKET Left 08/17/2015   Procedure: STONE EXTRACTION WITH BASKET;  Surgeon: Irine Seal, MD;  Location: [facility];  Service: Urology;  Laterality: Left;   SUBMUCOSAL LIFTING INJECTION  05/08/2020   Procedure: SUBMUCOSAL LIFTING INJECTION;  Surgeon: Irving Copas., MD;  Location: [facility];  Service: Gastroenterology;;   WISDOM TOOTH EXTRACTION  1994    Family History  Problem Relation Age of Onset   Asthma Father    Other Father        bronchitis   Allergic rhinitis Father    Diabetes Maternal Grandfather 52       Heavy smoker   Eczema Neg Hx    Immunodeficiency Neg Hx    Urticaria Neg Hx    Colon polyps Neg Hx    Colon cancer Neg Hx    Esophageal cancer Neg Hx    Rectal cancer Neg Hx    Stomach cancer Neg Hx    Inflammatory bowel disease Neg Hx    Liver disease Neg  Hx    Pancreatic cancer Neg Hx     Social History   Tobacco Use   Smoking status: Never   Smokeless tobacco: Never  Substance Use Topics   Alcohol use: Yes    Comment: socially     Subjective:   Low back pain/ left hip pain x 3-4 weeks; has been trying to exercise more regularly; has been referred to endocrine by his pulmonologist due to concern for possible steroid induced adrenal insufficiency;  Is taking Tumeric with limited benefit;  In the process of tapering off the prednisone and has noticed that these symptoms have flared; wondering if the high dose of prednisone he was taking for chronic lung issues may have masked joint issues;    Objective:  Vitals:   05/16/22 1536  BP: 134/80  Pulse: 87  Resp: 20  Temp: 99 F (37.2 C)  TempSrc: Oral  SpO2: 91%  Weight: 283 lb (128.4 kg)  Height: '5\' 10"'$  (1.778 m)    General: Well developed, well nourished, in no acute distress  Skin : Warm and dry.  Head: Normocephalic and atraumatic  Lungs: Respirations unlabored;   Musculoskeletal: No deformities; no active joint inflammation  Extremities: No edema, cyanosis, clubbing  Vessels: Symmetric bilaterally  Neurologic: Alert and oriented; speech intact; face symmetrical; moves all extremities well; CNII-XII intact without focal deficit   Assessment:  1. Left hip pain   2. Low back pain, unspecified back pain laterality, unspecified chronicity, unspecified whether sciatica present     Plan:  Patient is on daily prednisone 5 mg per pulmonology; discussed trial of muscle relaxer and Tylenol for symptom relief; will update left hip X-ray and lumbar x-ray; will most likely need PT and/or orthopedic evaluation; discussed benefit of having proper fitting athletic shoes for the gym; follow up to be determined.   No follow-ups on file.  Orders Placed This Encounter  Procedures   DG Lumbar Spine Complete    Standing Status:   Future    Number of Occurrences:   1    Standing Expiration Date:   05/17/2023    Order Specific Question:   Reason for Exam (SYMPTOM  OR DIAGNOSIS REQUIRED)    Answer:   low back pain    Order Specific Question:   Preferred imaging location?    Answer:   [place]   DG Hip Unilat W OR W/O Pelvis 2-3 Views Left    Standing Status:   Future    Number of Occurrences:   1    Standing Expiration Date:   05/17/2023    Order Specific Question:   Reason for Exam (SYMPTOM  OR DIAGNOSIS REQUIRED)    Answer:   left hip pain    Order Specific Question:   Preferred imaging location?    Answer:   [facility]    Requested Prescriptions   Signed Prescriptions Disp Refills   methocarbamol (ROBAXIN) 500 MG tablet 30 tablet 0    Sig: Take 1 tablet (500 mg total) by mouth every 8 (eight) hours as needed for muscle spasms.   sildenafil (VIAGRA) 100 MG tablet 5 tablet 2    Sig: Take 0.5-1 tablets (50-100 mg total) by mouth daily as needed for erectile dysfunction.

## 2022-05-18 ENCOUNTER — Other Ambulatory Visit: Payer: Self-pay | Admitting: Internal Medicine

## 2022-05-20 ENCOUNTER — Other Ambulatory Visit: Payer: Self-pay | Admitting: Family

## 2022-05-20 DIAGNOSIS — M25552 Pain in left hip: Secondary | ICD-10-CM

## 2022-05-20 DIAGNOSIS — M545 Low back pain, unspecified: Secondary | ICD-10-CM

## 2022-05-21 ENCOUNTER — Telehealth: Payer: Self-pay | Admitting: Internal Medicine

## 2022-05-21 ENCOUNTER — Ambulatory Visit: Payer: Self-pay

## 2022-05-21 ENCOUNTER — Ambulatory Visit (INDEPENDENT_AMBULATORY_CARE_PROVIDER_SITE_OTHER): Payer: 59 | Admitting: Orthopedic Surgery

## 2022-05-21 ENCOUNTER — Encounter: Payer: Self-pay | Admitting: Orthopedic Surgery

## 2022-05-21 VITALS — BP 156/82 | HR 80 | Ht 70.0 in | Wt 283.0 lb

## 2022-05-21 DIAGNOSIS — M549 Dorsalgia, unspecified: Secondary | ICD-10-CM

## 2022-05-21 NOTE — Progress Notes (Signed)
Orthopedic Spine Surgery Office Note  Assessment: Patient is a 56 y.o. male with left hip pain.  Main area of pain is in the groin but also wraps around to the posterior hip.  Internal and external rotation at the hip reproduce pain on exam today.  This could be arthritis or AVN from chronic steroid use   Plan: -I explained that he does have early degenerative changes on his radiographs.  His symptoms seem to be coming from the hip.  I discussed the fact the people that are on chronic steroids can develop AVN of the hip.  I do not see radiographic evidence of AVN at this time.  I discussed his options at this point which would be conservative treatment in the form of Tylenol, ibuprofen, physical therapy, injection.  He has tried over-the-counter's and they are not helping with his pain.  He has an upcoming vacation so he was interested in a intra-articular injection.  He is going to see Dr. Rolena Infante this week for a left hip intra-articular injection.  This will help from a diagnostic standpoint but also hopefully a therapeutic standpoint -If he has persistent symptoms, would recommend an MRI of the left hip to evaluate for AVN -Patient should return to office in 5 weeks, repeat x-rays of lumbar spine at next visit: None   Patient expressed understanding of the plan and all questions were answered to the patient's satisfaction.   ___________________________________________________________________________   History:  Patient is a 56 y.o. male who presents today for left hip pain.  Patient has had left hip pain for the last couple of months.  There was no trauma or injury that brought on the pain.  Pain is felt mostly in the groin area.  However, it does radiate to the posterior aspect of the hip and sometimes he feels it in his low back.  Pain is worse when he is moving through the hip.  He gives the example of putting socks on or trying to externally rotate his hip so that he can rest his leg on his  contralateral knee.  Also, has pain getting in and out of the car.  He feels the pain with ambulation.  He had to cut down on his exercise on the treadmill because of the pain.  He does not have any pain that radiates past the groin region.  No right leg symptoms.  Feels he has been limping for the last month.  Of note, he has a history of being on chronic prednisone   Weakness: Denies Symptoms of imbalance: Denies Paresthesias and numbness: Denies Bowel or bladder incontinence: Denies Saddle anesthesia: Denies  Treatments tried: Advil, Tylenol, activity modification  Review of systems: Denies fevers and chills, night sweats, unexplained weight loss, history of cancer, pain that wakes them at night  Past medical history: COPD Sleep apnea  Allergies: NKDA  Past surgical history:  No surgery x2  Social history: Denies use of nicotine product (smoking, vaping, patches, smokeless) Alcohol use: Rare Denies recreational drug use   Physical Exam:  General: no acute distress, appears stated age Neurologic: alert, answering questions appropriately, following commands Respiratory: unlabored breathing on room air, symmetric chest rise Psychiatric: appropriate affect, normal cadence to speech   MSK (spine):  -Strength exam      Left  Right EHL    5/5  5/5 TA    5/5  5/5 GSC    5/5  5/5 Knee extension  5/5  5/5 Hip flexion   5/5  5/5  -Sensory exam    Sensation intact to light touch in L3-S1 nerve distributions of bilateral lower extremities  -Achilles DTR: 1/4 on the left, 1/4 on the right -Patellar tendon DTR: 1/4 on the left, 1/4 on the right  -Straight leg raise: Negative -Contralateral straight leg raise: Negative -Clonus: no beats bilaterally  -Left hip exam: Pain with internal and external rotation, positive FADIR, negative FABER, positive C sign, positive Stinchfield -Right hip exam: No pain through range of motion, negative Stinchfield  Imaging: XR of the  lumbar spine from 05/16/2022 and 05/21/2022 was independently reviewed and interpreted, showing no fracture or dislocation. Disc height loss at L4/5. No significant anterior osteophyte formation.   XR of the left hip from 05/16/2022 was independently reviewed and interpreted, showing early degenerative changes in the left hip with subchondral sclerosis and joint space narrowing.  No fracture or dislocation. No subchondral lucency in the femoral head.    Patient name: [name] [name] Patient MRN: [id number] Date of visit: 05/21/22

## 2022-05-22 MED ORDER — CLORAZEPATE DIPOTASSIUM 7.5 MG PO TABS: ORAL_TABLET | ORAL | 1 refills | Status: DC

## 2022-05-22 NOTE — Telephone Encounter (Signed)
Tranxene refilled [employer]

## 2022-05-23 ENCOUNTER — Ambulatory Visit (INDEPENDENT_AMBULATORY_CARE_PROVIDER_SITE_OTHER): Payer: 59 | Admitting: Sports Medicine

## 2022-05-23 ENCOUNTER — Ambulatory Visit: Payer: Self-pay

## 2022-05-23 DIAGNOSIS — M25552 Pain in left hip: Secondary | ICD-10-CM | POA: Diagnosis not present

## 2022-05-23 MED ORDER — LIDOCAINE HCL 1 % IJ SOLN
4.0000 mL | INTRAMUSCULAR | Status: AC | PRN
  Administered 2022-05-23: 4 mL

## 2022-05-23 MED ORDER — METHYLPREDNISOLONE ACETATE 40 MG/ML IJ SUSP
80.0000 mg | INTRAMUSCULAR | Status: AC | PRN
  Administered 2022-05-23: 80 mg via INTRA_ARTICULAR

## 2022-05-23 NOTE — Progress Notes (Signed)
   Procedure Note  Patient: [name] [name]             Date of Birth: 1966/04/22           MRN: [id number]             Visit Date: 05/23/2022  Procedures: Visit Diagnoses:  1. Pain in left hip    Large Joint Inj: L hip joint on 05/23/2022 10:09 AM Indications: pain Details: 22 G 3.5 in needle, ultrasound-guided anterior approach Medications: 4 mL lidocaine 1 %; 80 mg methylPREDNISolone acetate 40 MG/ML Outcome: tolerated well, no immediate complications  Procedure: US-guided intra-articular hip injection, Left After discussion on risks/benefits/indications and informed verbal consent was obtained, a timeout was performed. Patient was lying supine on exam table. The hip was cleaned with betadine and alcohol swabs. Then utilizing ultrasound guidance, the patient's femoral head and neck junction was identified and subsequently injected with 4:2 lidocaine:depomedrol via an in-plane approach with ultrasound visualization of the injectate administered into the hip joint. Patient tolerated procedure well without immediate complications.  Procedure, treatment alternatives, risks and benefits explained, specific risks discussed. Consent was given by the patient. Immediately prior to procedure a time out was called to verify the correct patient, procedure, equipment, support staff and site/side marked as required. Patient was prepped and draped in the usual sterile fashion.     - I evaluated the patient about 10 minutes post-injection and he had improvement in pain and range of motion - follow-up with Dr. Laurance Flatten as indicated; I am happy to see them as needed  [place] Barman, DO [place]  This note was dictated using Dragon naturally speaking software and may contain errors in syntax, spelling, or content which have not been identified prior to signing this note.

## 2022-05-23 NOTE — Progress Notes (Signed)
Referral from Dr. Laurance Flatten for left hip injection

## 2022-05-24 ENCOUNTER — Other Ambulatory Visit: Payer: Self-pay | Admitting: Internal Medicine

## 2022-05-24 ENCOUNTER — Telehealth: Payer: Self-pay | Admitting: Internal Medicine

## 2022-05-24 DIAGNOSIS — J4550 Severe persistent asthma, uncomplicated: Secondary | ICD-10-CM

## 2022-05-24 MED ORDER — FASENRA PEN 30 MG/ML ~~LOC~~ SOAJ: SUBCUTANEOUS | 2 refills | Status: DC

## 2022-05-24 NOTE — Telephone Encounter (Signed)
Patient appears to need refills on Fasenra.  Refill sent for [facility] to [place]: [phone]   Dose: 30 mg SQ every 8 weeks  Last OV: 04/22/22 Provider: Dr. Annamaria Boots  Next OV: 6 months (10/18/2022)  Advised patient and wife (per his request since he said he does not manage his medications). Wife's VM box is not set up. Laled patient back and told him to just tell his wife the above and she will know what to do from there  Nothing further needed.  Knox Saliva, PharmD, MPH, BCPS Clinical Pharmacist (Rheumatology and Pulmonology)

## 2022-06-27 ENCOUNTER — Ambulatory Visit (INDEPENDENT_AMBULATORY_CARE_PROVIDER_SITE_OTHER): Payer: 59 | Admitting: Orthopedic Surgery

## 2022-06-27 DIAGNOSIS — M25552 Pain in left hip: Secondary | ICD-10-CM | POA: Diagnosis not present

## 2022-06-27 MED ORDER — MELOXICAM 7.5 MG PO TABS: 7.5000 mg | ORAL_TABLET | Freq: Every day | ORAL | 0 refills | Status: AC

## 2022-06-27 NOTE — Progress Notes (Signed)
Orthopedic Spine Surgery Office Note  Assessment: Patient is a 56 y.o. male with history of chronic and current prednisone use who I previously seen for left hip pain.  His exam shows pain with internal and external rotation of the hip.  He did get improvement of the left hip pain with a steroid injection by Dr. Rolena Infante.  His hip seems to be the etiology of the pain.   Plan: -Explained that initially conservative treatment is tried as a significant number of patients may experience relief with these treatment modalities. Discussed that the conservative treatments include:  -activity modification  -physical therapy  -over the counter pain medications  -Steroid injection -Prescribed meloxicam for flares.  Explained that he should not take this regularly and should really only use it for severe pain since he is on prednisone -Recommend that he use Tylenol for more every day pain.  Told him that he can use 1000 mg 3 times per day. -Discussed that weight loss can help with pain as well and strengthening around the hip joint.  He is going to work on weight loss. -Patient has tried activity modification, Tylenol, steroid injection -If his pain returns to level it was at, I told him that we could try another injection to his hip at her next visit -I also told him that we may end up getting an MRI of his left hip to evaluate for AVN at some point in the future if the pain does persist in spite of conservative treatments -Patient should return to office in 6 weeks, repeat x-rays at next visit: None   Patient expressed understanding of the plan and all questions were answered to the patient's satisfaction.   ___________________________________________________________________________  History: Patient is a 56 y.o. male who has been previously seen in the office for symptoms consistent with lumbar radiculopathy. Since the last visit, symptoms have improved.  He notes that his hip pain is not as severe.  He  still feels it on a regular basis.  He went to the gym within the last couple weeks and did walking on the treadmill and leg presses.  He did not have much pain when he was doing the workout at the gym, but did notice significant pain the next day.  That is since gotten better.  He is not having moderate amount of pain but is able to go about his daily activities.  He still thinks that the injection is helping take the edge off of his hip pain.  Pain is still felt mostly in the groin. No right hip pain. Denies paresthesias or numbness.  Previous treatments: activity modification, Tylenol, steroid injection  COPY OF FIRST NOTE Patient is a 56 y.o. male who presents today for left hip pain.  Patient has had left hip pain for the last couple of months.  There was no trauma or injury that brought on the pain.  Pain is felt mostly in the groin area.  However, it does radiate to the posterior aspect of the hip and sometimes he feels it in his low back.  Pain is worse when he is moving through the hip.  He gives the example of putting socks on or trying to externally rotate his hip so that he can rest his leg on his contralateral knee.  Also, has pain getting in and out of the car.  He feels the pain with ambulation.  He had to cut down on his exercise on the treadmill because of the pain.  He does  not have any pain that radiates past the groin region.  No right leg symptoms.  Feels he has been limping for the last month.   Of note, he has a history of being on chronic prednisone     Weakness: Denies Symptoms of imbalance: Denies Paresthesias and numbness: Denies Bowel or bladder incontinence: Denies Saddle anesthesia: Denies END OF COPY  Physical Exam:  General: no acute distress, appears stated age, walks with antalgic gait on the left side Neurologic: alert, answering questions appropriately, following commands Respiratory: unlabored breathing on room air, symmetric chest rise Psychiatric:  appropriate affect, normal cadence to speech   MSK (spine):  -Strength exam      Left  Right EHL    5/5  5/5 TA    5/5  5/5 GSC    5/5  5/5 Knee extension  5/5  5/5 Hip flexion   5/5  5/5  -Sensory exam    Sensation intact to light touch in L3-S1 nerve distributions of bilateral lower extremities  -Straight leg raise: negative -Contralateral straight leg raise: negative -Clonus: no beats bilaterally  -Left hip exam: Pain with internal rotation at the hip, most pain with internal rotation, positive Stinchfield, pain with flexion past 90 degrees at the hip -Right hip exam: No pain through range of motion, negative Faber  Imaging: XR of the lumbar spine from 05/16/2022 and 05/21/2022 was previously independently reviewed and interpreted, showing no fracture or dislocation. Disc height loss at L4/5. No significant anterior osteophyte formation.    XR of the left hip from 05/16/2022 was previously independently reviewed and interpreted, showing early degenerative changes in the left hip with subchondral sclerosis and joint space narrowing.  No fracture or dislocation. No subchondral lucency in the femoral head.    Patient name: [name] [name] Patient MRN: [id number] Date of visit: 06/27/22

## 2022-07-06 ENCOUNTER — Other Ambulatory Visit: Payer: Self-pay | Admitting: Internal Medicine

## 2022-07-22 ENCOUNTER — Other Ambulatory Visit: Payer: Self-pay | Admitting: Internal Medicine

## 2022-07-23 NOTE — Telephone Encounter (Signed)
Please advise on refill request of pt's clorazepate.   No Known Allergies   Current Outpatient Medications:    Benralizumab (FASENRA PEN) 30 MG/ML SOAJ, INJECT '30MG'$  SUBCUTANEOUSLY EVERY 8 WEEKS, Disp: 1 mL, Rfl: 2   budesonide-formoterol (SYMBICORT) 160-4.5 MCG/ACT inhaler, USE 2 INHALATIONS BY MOUTH TWICE DAILY ; RINSE MOUTH AFTER USE, Disp: 30.6 g, Rfl: 3   clonazePAM (KLONOPIN) 1 MG tablet, 1 OR 2 TABLETS BEFORE BED FOR SLEEP, Disp: 30 tablet, Rfl: 5   clorazepate (TRANXENE) 7.5 MG tablet, TAKE 1 TO 2 TABLETS BY MOUTH AT  NIGHT AS NEEDED FOR SLEEP, Disp: 180 tablet, Rfl: 1   fexofenadine (ALLEGRA) 180 MG tablet, Take 90 mg by mouth daily., Disp: , Rfl:    fluticasone (FLONASE) 50 MCG/ACT nasal spray, SPRAY 2 SPRAYS INTO EACH NOSTRIL EVERY DAY, Disp: 54 mL, Rfl: 3   ipratropium-albuterol (DUONEB) 0.5-2.5 (3) MG/3ML SOLN, USE 1 VIAL VIA NEBULIZER 4 TIMES DAILY AS NEEDED, Disp: 1080 mL, Rfl: 5   meloxicam (MOBIC) 7.5 MG tablet, Take 1 tablet (7.5 mg total) by mouth daily., Disp: 30 tablet, Rfl: 0   methocarbamol (ROBAXIN) 500 MG tablet, Take 1 tablet (500 mg total) by mouth every 8 (eight) hours as needed for muscle spasms., Disp: 30 tablet, Rfl: 0   montelukast (SINGULAIR) 10 MG tablet, TAKE 1 TABLET BY MOUTH DAILY, Disp: 90 tablet, Rfl: 3   Nebulizers (COMPRESSOR NEBULIZER) MISC, 1 Device by Does not apply route 4 (four) times daily as needed., Disp: 1 each, Rfl: 0   Omega-3 Fatty Acids (FISH OIL) 1200 MG CAPS, Take 1,200 mg by mouth daily., Disp: , Rfl:    predniSONE (DELTASONE) 5 MG tablet, As directed- minimal effective dose, Disp: 60 tablet, Rfl: 5   PROVENTIL HFA 108 (90 Base) MCG/ACT inhaler, Inhale 1-2 puffs into the lungs every 6 (six) hours as needed for wheezing or shortness of breath., Disp: 3 each, Rfl: 3   sildenafil (VIAGRA) 100 MG tablet, Take 1 tablet (100 mg total) by mouth daily as needed for erectile dysfunction., Disp: 15 tablet, Rfl: 3   sildenafil (VIAGRA) 100 MG tablet,  Take 0.5-1 tablets (50-100 mg total) by mouth daily as needed for erectile dysfunction., Disp: 5 tablet, Rfl: 2   Spacer/[name]-Holding Chambers (AEROCHAMBER MV) inhaler, Use as instructed, Disp: 1 each, Rfl: 0   traZODone (DESYREL) 50 MG tablet, Take 50 mg by mouth at bedtime., Disp: , Rfl:    Turmeric (QC TUMERIC COMPLEX PO), Take by mouth., Disp: , Rfl:

## 2022-07-23 NOTE — Telephone Encounter (Signed)
Tranxene refilled [facility]

## 2022-07-26 ENCOUNTER — Other Ambulatory Visit: Payer: Self-pay | Admitting: Orthopedic Surgery

## 2022-07-29 ENCOUNTER — Encounter: Payer: Self-pay | Admitting: Family

## 2022-07-30 ENCOUNTER — Telehealth: Payer: Self-pay | Admitting: Internal Medicine

## 2022-07-30 NOTE — Telephone Encounter (Signed)
Pt the office stating that he has not been feeling well for 3 days now. States that on day 1, he was having complaints of no appetite, terrible abdominal pain, diarrhea, and fever. States those symptoms went away after the first day.  Pt said he is beginning to get his appetite back now.  Pt now is having symptoms including cough, head congestion, postnasal drainage, and also some wheezing. Pt said that with his cough, he will sometimes get up white phlegm.   Asked pt if he had been tested for covid and he said that he did a covid test which came back negative.   Stated to pt that he should also get tested for the flu due to how he feels but pt said he does not feel like he will be able to get out of the house now to go to get a flu test done.   Pt wants to know what recommendations we might be able to give him to help with his symptoms.  Dr. Annamaria Boots please advise.   **Please route this to triage pool and not directly back to myself as I'm up front on phones**   No Known Allergies   Current Outpatient Medications:    clorazepate (TRANXENE) 7.5 MG tablet, TAKE 1 TO 2 TABLETS BY MOUTH AT  NIGHT AS NEEDED FOR SLEEP, Disp: 180 tablet, Rfl: 1   Benralizumab (FASENRA PEN) 30 MG/ML SOAJ, INJECT '30MG'$  SUBCUTANEOUSLY EVERY 8 WEEKS, Disp: 1 mL, Rfl: 2   budesonide-formoterol (SYMBICORT) 160-4.5 MCG/ACT inhaler, USE 2 INHALATIONS BY MOUTH TWICE DAILY ; RINSE MOUTH AFTER USE, Disp: 30.6 g, Rfl: 3   clonazePAM (KLONOPIN) 1 MG tablet, 1 OR 2 TABLETS BEFORE BED FOR SLEEP, Disp: 30 tablet, Rfl: 5   fexofenadine (ALLEGRA) 180 MG tablet, Take 90 mg by mouth daily., Disp: , Rfl:    fluticasone (FLONASE) 50 MCG/ACT nasal spray, USE 2 SPRAYS IN BOTH NOSTRILS  DAILY, Disp: 48 g, Rfl: 3   ipratropium-albuterol (DUONEB) 0.5-2.5 (3) MG/3ML SOLN, USE 1 VIAL VIA NEBULIZER 4 TIMES DAILY AS NEEDED, Disp: 1080 mL, Rfl: 5   methocarbamol (ROBAXIN) 500 MG tablet, Take 1 tablet (500 mg total) by mouth every 8 (eight)  hours as needed for muscle spasms., Disp: 30 tablet, Rfl: 0   montelukast (SINGULAIR) 10 MG tablet, TAKE 1 TABLET BY MOUTH DAILY, Disp: 90 tablet, Rfl: 3   Nebulizers (COMPRESSOR NEBULIZER) MISC, 1 Device by Does not apply route 4 (four) times daily as needed., Disp: 1 each, Rfl: 0   Omega-3 Fatty Acids (FISH OIL) 1200 MG CAPS, Take 1,200 mg by mouth daily., Disp: , Rfl:    predniSONE (DELTASONE) 5 MG tablet, As directed- minimal effective dose, Disp: 60 tablet, Rfl: 5   PROVENTIL HFA 108 (90 Base) MCG/ACT inhaler, Inhale 1-2 puffs into the lungs every 6 (six) hours as needed for wheezing or shortness of breath., Disp: 3 each, Rfl: 3   sildenafil (VIAGRA) 100 MG tablet, Take 1 tablet (100 mg total) by mouth daily as needed for erectile dysfunction., Disp: 15 tablet, Rfl: 3   sildenafil (VIAGRA) 100 MG tablet, Take 0.5-1 tablets (50-100 mg total) by mouth daily as needed for erectile dysfunction., Disp: 5 tablet, Rfl: 2   Spacer/[name]-Holding Chambers (AEROCHAMBER MV) inhaler, Use as instructed, Disp: 1 each, Rfl: 0   traZODone (DESYREL) 50 MG tablet, Take 50 mg by mouth at bedtime., Disp: , Rfl:    Turmeric (QC TUMERIC COMPLEX PO), Take by mouth., Disp: , Rfl:

## 2022-07-30 NOTE — Telephone Encounter (Signed)
Spoke with patient advised of Dr. Janee Morn recommendations. Patient states he had a low grade fever of 100.5 on Saturday but hasn't had one since.  Just FYI

## 2022-07-30 NOTE — Telephone Encounter (Signed)
Not clear what is going on, but likely viral infection. For short term, double your usual daily dose of prednisone until you feel better. Stay well- hydrated, warm and use regular breathing meds. See how this is going over next day or 2. Any fever?

## 2022-08-08 ENCOUNTER — Ambulatory Visit (INDEPENDENT_AMBULATORY_CARE_PROVIDER_SITE_OTHER): Payer: 59 | Admitting: Orthopedic Surgery

## 2022-08-08 DIAGNOSIS — M1612 Unilateral primary osteoarthritis, left hip: Secondary | ICD-10-CM | POA: Diagnosis not present

## 2022-08-08 MED ORDER — MELOXICAM 7.5 MG PO TABS: 7.5000 mg | ORAL_TABLET | Freq: Every day | ORAL | 0 refills | Status: DC

## 2022-08-08 NOTE — Progress Notes (Signed)
Orthopedic Spine Surgery Office Note   Assessment: Patient is a 57 y.o. male with history of chronic and current prednisone use who I previously seen for left hip pain.  His exam shows pain with internal and a positive Stinchfield.  He did get improvement of the left hip pain with a steroid injection by Dr. Rolena Infante.  His hip seems to be the etiology of the pain.     Plan: -Explained that initially conservative treatment is tried as a significant number of patients may experience relief with these treatment modalities. Discussed that the conservative treatments include:             -activity modification             -physical therapy             -over the counter pain medications             -Steroid injection -Prescribed meloxicam again for flares.  Should only use this for severe pain.  Encouraged him to take it with food and plenty of fluids -Recommend that he use Tylenol for every day pain.  Told him again that he can use 1000 mg 3 times per day. -We went over weight loss again and how that can help him with some of his pain.  He said has been to the gym twice but has not been able to do much recently because of his upper respiratory infection -Patient has tried activity modification, Tylenol, steroid injection -Provide him with another referral to Dr. Rolena Infante for a left hip injection -Still considering getting a left hip MRI if his symptoms persist to evaluate for AVN -Patient should return to office in 8 weeks, repeat x-rays at next visit: Left hip AP/lateral     Patient expressed understanding of the plan and all questions were answered to the patient's satisfaction.    ___________________________________________________________________________   History: Patient is a 57 y.o. male who has been previously seen in the office for symptoms consistent with left hip OA.  Patient states he got good relief with his injection with Dr. Rolena Infante.  He says his pain is better with Tylenol and periodic  meloxicam use.  He was resting when he had the flu recently and that also helped.  His pain is returned as he started to get back to work.  Pain is felt in the groin and in the posterior hip.  It is on the left side.  Pain is currently still better than when I first saw him.  Is able to ambulate without any assistive devices.  Denies paresthesias and numbness.   Previous treatments: activity modification, Tylenol, steroid injection   COPY OF FIRST NOTE Patient is a 57 y.o. male who presents today for left hip pain.  Patient has had left hip pain for the last couple of months.  There was no trauma or injury that brought on the pain.  Pain is felt mostly in the groin area.  However, it does radiate to the posterior aspect of the hip and sometimes he feels it in his low back.  Pain is worse when he is moving through the hip.  He gives the example of putting socks on or trying to externally rotate his hip so that he can rest his leg on his contralateral knee.  Also, has pain getting in and out of the car.  He feels the pain with ambulation.  He had to cut down on his exercise on the treadmill because of the  pain.  He does not have any pain that radiates past the groin region.  No right leg symptoms.  Feels he has been limping for the last month.   Of note, he has a history of being on chronic prednisone     Weakness: Denies Symptoms of imbalance: Denies Paresthesias and numbness: Denies Bowel or bladder incontinence: Denies Saddle anesthesia: Denies END OF COPY   Physical Exam:   General: no acute distress, appears stated age, walks with antalgic gait on the left side Neurologic: alert, answering questions appropriately, following commands Respiratory: unlabored breathing on room air, symmetric chest rise Psychiatric: appropriate affect, normal cadence to speech     MSK (spine):   -Strength exam                                                   Left                  Right EHL                               5/5                  5/5 TA                                 5/5                  5/5 GSC                             5/5                  5/5 Knee extension            5/5                  5/5 Hip flexion                    5/5                  5/5   -Sensory exam                           Sensation intact to light touch in L3-S1 nerve distributions of bilateral lower extremities   -Straight leg raise: negative -Contralateral straight leg raise: negative -Clonus: no beats bilaterally   -Left hip exam: Pain with internal rotation at the hip, most pain with internal rotation, positive Stinchfield, pain with flexion past 90 degrees at the hip, negative FABER -Right hip exam: No pain through range of motion, negative Faber   Imaging: XR of the lumbar spine from 05/16/2022 and 05/21/2022 was previously independently reviewed and interpreted, showing no fracture or dislocation. Disc height loss at L4/5. No significant anterior osteophyte formation.    XR of the left hip from 05/16/2022 was previously independently reviewed and interpreted, showing early degenerative changes in the left hip with subchondral sclerosis and joint space narrowing.  No fracture or dislocation. No subchondral lucency in the femoral head.      Patient name: [name] [name] Patient MRN: [id number]  Date of visit: 08/08/22

## 2022-08-29 ENCOUNTER — Ambulatory Visit: Payer: Self-pay

## 2022-08-29 ENCOUNTER — Encounter: Payer: Self-pay | Admitting: Sports Medicine

## 2022-08-29 ENCOUNTER — Ambulatory Visit (INDEPENDENT_AMBULATORY_CARE_PROVIDER_SITE_OTHER): Payer: 59 | Admitting: Sports Medicine

## 2022-08-29 DIAGNOSIS — M25552 Pain in left hip: Secondary | ICD-10-CM | POA: Diagnosis not present

## 2022-08-29 MED ORDER — METHYLPREDNISOLONE ACETATE 40 MG/ML IJ SUSP
80.0000 mg | INTRAMUSCULAR | Status: AC | PRN
  Administered 2022-08-29: 80 mg via INTRA_ARTICULAR

## 2022-08-29 MED ORDER — LIDOCAINE HCL 1 % IJ SOLN
4.0000 mL | INTRAMUSCULAR | Status: AC | PRN
  Administered 2022-08-29: 4 mL

## 2022-08-29 NOTE — Progress Notes (Signed)
   Procedure Note  Patient: [name] [name]             Date of Birth: [birth year]           MRN: [id number]             Visit Date: 08/29/2022  Procedures: Visit Diagnoses:  1. Pain in left hip    Large Joint Inj: L hip joint on 08/29/2022 10:51 AM Indications: pain Details: 22 G 3.5 in needle, ultrasound-guided anterior approach Medications: 4 mL lidocaine 1 %; 80 mg methylPREDNISolone acetate 40 MG/ML Outcome: tolerated well, no immediate complications  Procedure: US-guided intra-articular hip injection, left After discussion on risks/benefits/indications and informed verbal consent was obtained, a timeout was performed. Patient was lying supine on exam table. The hip was cleaned with betadine and alcohol swabs. Then utilizing ultrasound guidance, the patient's femoral head and neck junction was identified and subsequently injected with 4:2 lidocaine:depomedrol via an in-plane approach with ultrasound visualization of the injectate administered into the hip joint. Patient tolerated procedure well without immediate complications.  Procedure, treatment alternatives, risks and benefits explained, specific risks discussed. Consent was given by the patient. Immediately prior to procedure a time out was called to verify the correct patient, procedure, equipment, support staff and site/side marked as required. Patient was prepped and draped in the usual sterile fashion.     - I evaluated the patient about 10 minutes post-injection and he had excellent improvement in pain and range of motion - work restriction noted provided for next 72 hrs - follow-up with Dr. Laurance Flatten as indicated; I am happy to see them as needed  Elba Barman, DO [place]  This note was dictated using Dragon naturally speaking software and may contain errors in syntax, spelling, or content which have not been identified prior to signing this note.

## 2022-09-05 ENCOUNTER — Telehealth: Payer: Self-pay | Admitting: Pharmacist

## 2022-09-05 NOTE — Telephone Encounter (Signed)
Submitted a Prior Authorization RENEWAL request to [facility] for [facility] via CoverMyMeds. Will update once we receive a response.  Key: Laury Deep, PharmD, MPH, BCPS, CPP Clinical Pharmacist (Rheumatology and Pulmonology)

## 2022-09-06 ENCOUNTER — Other Ambulatory Visit: Payer: Self-pay | Admitting: Orthopedic Surgery

## 2022-09-06 NOTE — Telephone Encounter (Signed)
Received notification from [facility] regarding a prior authorization for [facility]. Authorization has been APPROVED from 09/05/2022 to 09/06/2023. Approval letter sent to scan center.  Patient must continue to fill through [facility]: [phone]   Authorization # [id number]  Knox Saliva, PharmD, MPH, BCPS, CPP Clinical Pharmacist (Rheumatology and Pulmonology)

## 2022-09-25 ENCOUNTER — Ambulatory Visit (INDEPENDENT_AMBULATORY_CARE_PROVIDER_SITE_OTHER): Payer: 59 | Admitting: Orthopedic Surgery

## 2022-09-25 ENCOUNTER — Ambulatory Visit (INDEPENDENT_AMBULATORY_CARE_PROVIDER_SITE_OTHER): Payer: 59

## 2022-09-25 DIAGNOSIS — M25551 Pain in right hip: Secondary | ICD-10-CM | POA: Diagnosis not present

## 2022-09-25 DIAGNOSIS — M25552 Pain in left hip: Secondary | ICD-10-CM

## 2022-09-26 NOTE — Progress Notes (Signed)
Orthopedic Spine Surgery Office Note  Assessment: Patient is a 57 y.o. male with history of chronic prednisone use who comes in with left hip and buttock pain.  Physical exam points to hip as etiology and he has gotten relief with prior steroid injections.  Diagnosis: Hip OA   Plan: -Patient has tried activity modification, Tylenol, steroid injection  -We discussed hip arthroplasty as a treatment option for him since he has tried conservative treatments without significant lasting relief.  He is not interested in that at this time.  So, I recommended continued Tylenol use, activity modification, and periodic steroid injections -Patient should return to office in 8 weeks, x-rays at next visit: None   Patient expressed understanding of the plan and all questions were answered to the patient's satisfaction.   ___________________________________________________________________________  History: Patient is a 57 y.o. male who has been previously seen in the office for symptoms of left hip and buttock pain.  Patient after our last visit excellently tapered his chronic prednisone from 5 mg to 1 mg.  He noted pain in multiple locations including the shoulders, the right knee, and the bilateral hips.  Since the switch back to the 5 mg dosage, most of his pain has gotten better.  He is still left with the left hip pain and buttock pain that is similar to when I had seen him in the past.  Pain is worse with weightbearing or going up stairs.  It is better with rest.  He has had injections in the past and they do help him for about 2 months.  He does not have any pain radiating past the hip no numbness or paresthesias.  Previous treatments: activity modification, Tylenol, steroid injection   Physical Exam:  General: no acute distress, appears stated age Neurologic: alert, answering questions appropriately, following commands Respiratory: unlabored breathing on room air, symmetric chest rise Psychiatric:  appropriate affect, normal cadence to speech   MSK (spine):  -Strength exam      Left  Right EHL    5/5  5/5 TA    5/5  5/5 GSC    5/5  5/5 Knee extension  5/5  5/5 Hip flexion   5/5  5/5  -Sensory exam    Sensation intact to light touch in L3-S1 nerve distributions of bilateral lower extremities  -Straight leg raise: Negative -Contralateral straight leg raise: Negative -Femoral nerve stretch test: Negative bilaterally -Clonus: no beats bilaterally  -Left hip exam: Positive FADIR, negative FABER, pain with internal rotation at the hip with decreased internal rotation when compared to contralateral side, positive Stinchfield -Right hip exam: Negative FADIR, negative FABER, no pain through range of motion, negative Stinchfield  Imaging: XR of the bilateral hips spine from 09/25/2022 was independently reviewed and interpreted, showing joint space narrowing and subchondral sclerosis in the left hip.  Early joint space narrowing and subchondral sclerosis in the right hip.  No fracture or dislocation.   Patient name: [name] [name] Patient MRN: [id number] Date of visit: 09/26/22

## 2022-10-05 ENCOUNTER — Other Ambulatory Visit: Payer: Self-pay | Admitting: Internal Medicine

## 2022-10-05 DIAGNOSIS — J4550 Severe persistent asthma, uncomplicated: Secondary | ICD-10-CM

## 2022-10-09 NOTE — Progress Notes (Unsigned)
Patient ID: [name] [name], male    DOB: [birth year], 58 y.o.   MRN: [id number]  HPI male never smoker followed for chronic obstructive asthma, OSA/quit CPAP, rhinitis/chronic sinusitis Unattended Home Sleep Test 02/09/15- mild OSA/ AHI 9.3/ hr, desat to 85%, weight 276 lbs PFT 03/06/2007 PFT 01/29/2011 Office Spirometry 02/03/2014-severe obstructive airways disease with low FVC.  FVC 3.13/62%, FEV1 2.01/50%, ratio 0.64,  FEF 25-75% 0.87/22% CT sinus 11/23/2009-moderate chronic appearing pansinusitis. Surgery 09/24/2010-polypoid chronic sinusitis CBC with differential 12/28/2015-eosinophils 7.3% CBC w diff 09/28/2018- steroid pattern EOS 0.2 k/ul FENO 10/02/16- 33 H Office spirometry 10/02/16-moderate restriction and obstruction. FVC 2.66/53%, FEV1 2.0/51%, ratio 0.75, FEF 25-75% 1.60/46% IgE 10/02/2016-295 PFT 01/29/2019- Mod obst, mild restrction, slight response to dilator, normal Diffusion Allergy Skin Testing 09/21/19- Dr Verlin Fester- + dust mites --------------------------------------------------------------------------------------   04/22/22- 57 year old male never smoker followed for chronic obstructive asthma  (Asthma- COPD Overlap Syndrome), OSA/quit CPAP, rhinitis/chronic polypoid sinusitis, Urticaria, Colon polyps, -Prednisone 5 mg, singulair, xyzal, neb Duoneb, Allegra, Ventolin hfa, Symbicort 160, Fasenra, clonazepam,  Covid vax 3 Phizer Flu vax-today standard ------Pt f/u for asthma, breathing is fine.  Tried to get more exercise- has improved tone and lost some weight. Noticing more aches and pains after exercise, mitigated by taking 5 mg prednisone daily. Discussed unmasking arthritis vs adrenal insufficiency. Will refer to Endocrinology with question of steroid induced adrenal insufficiency. He is very pleased that with [name] he has had much less airway trouble/ colds/ asthma. Having some L hip soreness. I asked he discuss this with PCP- arthritis, possible aseptic necrosis.  10/10/22-  57 year old male never smoker followed for chronic obstructive asthma  (Asthma- COPD Overlap Syndrome), OSA/quit CPAP, rhinitis/chronic polypoid sinusitis, Urticaria, Colon polyps, -Prednisone 5 mg, singulair, xyzal, neb Duoneb, Allegra, Ventolin hfa, Symbicort 160, Fasenra, clonazepam,  Covid vax 3 Phizer Flu vax-today standard Body weight today-    Review of Systems-See HPI       + = positive Constitutional:    No- night sweats ,fevers, chills, fatigue, lassitude. HEENT:   No - headaches,  Difficulty swallowing, Tooth/dental problems, Sore throat,                No sneezing, itching, ear ache, CV:  No chest pain,  Orthopnea, PND, swelling in lower extremities, anasarca, dizziness, palpitations GI  No heartburn, indigestion, abdominal pain, nausea, vomiting,  Resp:.See HPI    productive cough   No coughing up of blood.  change in color of mucus,  + wheeze Skin: + HPI GU: . MS:  No joint pain or swelling.  [name] Psych:   change in mood or affect.  depression and  anxiety.  No memory loss.   Objective:   Physical Exam General- Alert, Oriented, Affect-appropriate, Distress- none acute, + Obese / thick neck Skin- + birthmark R hand Lymphadenopathy- none Head- atraumatic            Eyes- Gross vision intact, PERRLA, conjunctivae clear secretions            Ears- Hearing, canals normal            Nose- Clear, no-Septal dev, mucus, polyps, erosion, perforation             Throat- Mallampati  III- IV , mucosa+ geographic, drainage- none, tonsils- atrophic. , Neck- flexible , trachea midline, no stridor , thyroid nl, carotid no bruit.  No obvious tenderness or mass. Chest - symmetrical excursion , unlabored           Heart/CV-  RRR , no murmur , no gallop  , no rub, nl s1 s2                           - JVD- none , edema- none, stasis changes- none, varices- none           Lung-  Wheeze-none, clear, unlabored,   cough -None , dullness-none, rub- none           Chest wall-  Abd-  Br/ Gen/  Rectal- Not done, not indicated Extrem- cyanosis- none, clubbing, none, atrophy- none, strength- nl Neuro- grossly intact to observation

## 2022-10-10 ENCOUNTER — Ambulatory Visit (INDEPENDENT_AMBULATORY_CARE_PROVIDER_SITE_OTHER): Payer: 59 | Admitting: Internal Medicine

## 2022-10-10 ENCOUNTER — Encounter: Payer: Self-pay | Admitting: Internal Medicine

## 2022-10-10 ENCOUNTER — Ambulatory Visit (INDEPENDENT_AMBULATORY_CARE_PROVIDER_SITE_OTHER): Payer: 59

## 2022-10-10 VITALS — BP 138/90 | HR 87 | Temp 98.4°F | Ht 70.0 in | Wt 291.2 lb

## 2022-10-10 DIAGNOSIS — E273 Drug-induced adrenocortical insufficiency: Secondary | ICD-10-CM | POA: Diagnosis not present

## 2022-10-10 DIAGNOSIS — J4489 Other specified chronic obstructive pulmonary disease: Secondary | ICD-10-CM

## 2022-10-10 DIAGNOSIS — J45901 Unspecified asthma with (acute) exacerbation: Secondary | ICD-10-CM

## 2022-10-10 DIAGNOSIS — T380X5A Adverse effect of glucocorticoids and synthetic analogues, initial encounter: Secondary | ICD-10-CM

## 2022-10-10 MED ORDER — AZITHROMYCIN 250 MG PO TABS: ORAL_TABLET | ORAL | 0 refills | Status: DC

## 2022-10-10 MED ORDER — PREDNISONE 10 MG PO TABS: ORAL_TABLET | ORAL | 0 refills | Status: DC

## 2022-10-10 NOTE — Telephone Encounter (Signed)
Refill sent for [facility] to [place]: [phone]   Dose: 30 mg sq every 8 weeks  Last OV: 10/10/22 Provider: Dr. Annamaria Boots  Next OV: 04/17/23  Knox Saliva, PharmD, MPH, BCPS Clinical Pharmacist (Rheumatology and Pulmonology)

## 2022-10-10 NOTE — Assessment & Plan Note (Signed)
He continues low-dose maintenance prednisone and understands we want to minimize exposure. Plan-continue maintenance prednisone 5 mg.  [place] prednisone prescription to be held for possible need when traveling.

## 2022-10-10 NOTE — Patient Instructions (Addendum)
Order- CXR  dx acute exacerbation asthmatic bronchitis  Script sent for Zpak  Script sent for prednisone taper to hold, for use if you fail to improve in next few days, or feel you are getting worse.  Stay well hydrated to loosen mucus.  Mucinex is ok.   Also ok to use an otc cough syrup like Delsym.

## 2022-10-10 NOTE — Assessment & Plan Note (Signed)
Acute exacerbation.  Pollen may contribute but I suspect this is a viral.  Discussed options.  We want to minimize systemic steroids but I will give him a prescription to hold for prednisone taper. Plan-chest x-ray, Z-Pak, prednisone taper to hold

## 2022-10-15 ENCOUNTER — Telehealth: Payer: Self-pay | Admitting: Internal Medicine

## 2022-10-15 MED ORDER — AMOXICILLIN-POT CLAVULANATE 875-125 MG PO TABS: 1.0000 | ORAL_TABLET | Freq: Two times a day (BID) | ORAL | 0 refills | Status: DC

## 2022-10-15 MED ORDER — BENZONATATE 200 MG PO CAPS: 200.0000 mg | ORAL_CAPSULE | Freq: Three times a day (TID) | ORAL | 1 refills | Status: DC | PRN

## 2022-10-15 NOTE — Telephone Encounter (Signed)
Informed pt I sent in Augmentin 875 mg and Benzonatate 200 mg to [facility] for pt. Pt verbalized understanding. Nothing further needed.

## 2022-10-15 NOTE — Telephone Encounter (Signed)
Pt. Calling saying he is still sick and want to know if dr. Annamaria Boots can call In another zpak he has finished what was giving to him

## 2022-10-15 NOTE — Telephone Encounter (Signed)
Augmentin 875 mg, # 14,  Benzonatate 200 mg, # 40, 1 every 8 hours as needed for cough

## 2022-10-15 NOTE — Telephone Encounter (Signed)
Spoke to pt and he states he has not been sleeping and has been wheezing and still congested and coughing. Mucus is a darker yellow and has been doing his nebulizer treatments, taking mucinex finished the z-pac, prednisone and taking his Symbicort inhaler. But not helping much for his sleeping. Also he is going out of town for 2 week tomorrow on vacation. I advised elevating head of bed and taking delsym. But pt states he thinks he needs another round of antibiotic. I informed pt I will send Dr Annamaria Boots a message and give pt a call back.

## 2022-10-16 ENCOUNTER — Other Ambulatory Visit: Payer: Self-pay

## 2022-10-16 DIAGNOSIS — J45901 Unspecified asthma with (acute) exacerbation: Secondary | ICD-10-CM

## 2022-10-18 ENCOUNTER — Ambulatory Visit: Payer: 59 | Admitting: Internal Medicine

## 2022-11-27 ENCOUNTER — Telehealth: Payer: Self-pay | Admitting: Orthopedic Surgery

## 2022-11-27 MED ORDER — MELOXICAM 7.5 MG PO TABS: 7.5000 mg | ORAL_TABLET | Freq: Every day | ORAL | 2 refills | Status: DC

## 2022-11-27 NOTE — Telephone Encounter (Signed)
Patient called needing Rx refilled Meloxicam. Patient said he uses [facility] at [address]. The number to contact patient is [phone]

## 2022-12-03 ENCOUNTER — Other Ambulatory Visit: Payer: Self-pay | Admitting: Pulmonary Disease

## 2022-12-03 ENCOUNTER — Telehealth: Payer: Self-pay | Admitting: Internal Medicine

## 2022-12-03 MED ORDER — CLORAZEPATE DIPOTASSIUM 7.5 MG PO TABS: ORAL_TABLET | ORAL | 1 refills | Status: DC

## 2022-12-03 NOTE — Telephone Encounter (Signed)
Prescription sent to [facility]

## 2022-12-03 NOTE — Telephone Encounter (Signed)
Received a call from pt's spouse stating that there has been a Sport and exercise psychologist of the clorazepate and even [facility] is out of the medication.  Tresa Endo stated [facility] [place] off of [address] currently has 100 pills of the clorazepate. With it being a local pharmacy, they said that only a 30-day supply of this is able to be [facility] at [facility].  Vladimir Faster aware that Dr. Maple Hudson was out of the office until Thursday  5/16 but she was wanting to know if there was another provider that might be able to fill this in Dr. Roxy Cedar place due to the shortage of the Rx. She stated that they have been trying to get a refill of this since April but nobody has this medication.  Dr. Val Eagle, with there being some avail at [facility] and since there might not be any left by the time Dr. Maple Hudson returns to the office, please advise if you are able to send a 30-day supply of pt's [facility]azepate to [facility] for [facility].

## 2022-12-03 NOTE — Telephone Encounter (Signed)
I called and spoke with the pt and notified of response per Dr Val Eagle  He verbalized understanding  Nothing further needed

## 2022-12-17 ENCOUNTER — Other Ambulatory Visit: Payer: Self-pay | Admitting: Internal Medicine

## 2022-12-17 NOTE — Telephone Encounter (Signed)
Prednisone scripts refilled

## 2022-12-24 ENCOUNTER — Other Ambulatory Visit: Payer: Self-pay

## 2022-12-24 ENCOUNTER — Ambulatory Visit (INDEPENDENT_AMBULATORY_CARE_PROVIDER_SITE_OTHER): Payer: 59 | Admitting: Sports Medicine

## 2022-12-24 ENCOUNTER — Encounter: Payer: Self-pay | Admitting: Sports Medicine

## 2022-12-24 DIAGNOSIS — M25552 Pain in left hip: Secondary | ICD-10-CM

## 2022-12-24 DIAGNOSIS — M1612 Unilateral primary osteoarthritis, left hip: Secondary | ICD-10-CM

## 2022-12-24 MED ORDER — LIDOCAINE HCL 1 % IJ SOLN
4.0000 mL | INTRAMUSCULAR | Status: AC | PRN
  Administered 2022-12-24: 4 mL

## 2022-12-24 MED ORDER — METHYLPREDNISOLONE ACETATE 40 MG/ML IJ SUSP
80.0000 mg | INTRAMUSCULAR | Status: AC | PRN
Start: 2022-12-24 — End: 2022-12-24
  Administered 2022-12-24: 80 mg via INTRA_ARTICULAR

## 2022-12-24 NOTE — Progress Notes (Signed)
   Office Note & Procedure Note  Patient: [name] [name]             Date of Birth: 23-Mar-1966           MRN: [id number]             Visit Date: 12/24/2022  HPI: [name] [name] is a pleasant 57 year-old male who presents for acute on chronic left hip pain with known osteoarthritis. Last had ultrasound-guided intra-articular hip injection back on 08/29/2022.  This did give him rather good relief for for few months, but he recently over the last few weeks he started to notice the hip pain bothering again.  Some pain in the groin but also in the posterior aspect of the hip as well.  He is taking meloxicam 7.5 mg daily.  States Dr. Christell Constant wanted to hold off on this lower dose while he is still taking prednisone 5 mg for his lungs.  PE: Evaluation of the left hip shows no redness or swelling.  There is about 10 degrees loss of internal rotation with logroll.  Negative FABER, positive FADIR test.  Visit Diagnoses:  1. Osteoarthritis of left hip, unspecified osteoarthritis type   2. Pain in left hip    Procedures:  Large Joint Inj: L hip joint on 12/24/2022 9:33 AM Indications: pain Details: 22 G 3.5 in needle, ultrasound-guided anterior approach Medications: 4 mL lidocaine 1 %; 80 mg methylPREDNISolone acetate 40 MG/ML Outcome: tolerated well, no immediate complications  Procedure: US-guided intra-articular hip injection, left After discussion on risks/benefits/indications and informed verbal consent was obtained, a timeout was performed. Patient was lying supine on exam table. The hip was cleaned with betadine and alcohol swabs. Then utilizing ultrasound guidance, the patient's femoral head and neck junction was identified and subsequently injected with 4:2 lidocaine:depomedrol via an in-plane approach with ultrasound visualization of the injectate administered into the hip joint. Patient tolerated procedure well without immediate complications.  Procedure, treatment alternatives, risks and benefits  explained, specific risks discussed. Consent was given by the patient. Immediately prior to procedure a time out was called to verify the correct patient, procedure, equipment, support staff and site/side marked as required. Patient was prepped and draped in the usual sterile fashion.     Plan: -Through shared decision-making, elected to proceed with ultrasound-guided intra-articular hip injection.  Patient tolerated well and had excellent relief of his pain even 5-10 minutes after the injection -He will continue his meloxicam 7.5 mg daily, he will work to wean off of the 5 mg of prednisone and will likely be able to increase his meloxicam to 15 mg daily -Discussed with him healthy weight loss offload the joint; discussed formalized physical therapy versus home rehab today, he will hold off for now but we will keep this in mind for the future -He may follow-up with me or Dr. Christell Constant as needed  Madelyn Brunner, DO Primary Care Sports Medicine Physician  [facility] - Orthopedics  This note was dictated using Dragon naturally speaking software and may contain errors in syntax, spelling, or content which have not been identified prior to signing this note.

## 2022-12-25 ENCOUNTER — Telehealth: Payer: Self-pay | Admitting: Internal Medicine

## 2022-12-25 NOTE — Telephone Encounter (Signed)
[name] [name] wife states patient needs refill for Clorazepate. Needs 3 month supply. [facility] [address] [address] [place]. Kelly phone number is [phone].

## 2022-12-26 NOTE — Telephone Encounter (Signed)
Dr. Maple Hudson patient is requesting a 3 month supply for clorazepate.   Please send if able. Thanks!

## 2022-12-27 MED ORDER — CLORAZEPATE DIPOTASSIUM 7.5 MG PO TABS: ORAL_TABLET | ORAL | 1 refills | Status: DC

## 2022-12-27 NOTE — Telephone Encounter (Signed)
[facility]ate refilled [facility] [address] [place]

## 2022-12-27 NOTE — Telephone Encounter (Signed)
Spoke with patient. Advised Clorazepate has been sent to pharmacy. NFN

## 2023-01-14 ENCOUNTER — Encounter: Payer: Self-pay | Admitting: Orthopedic Surgery

## 2023-01-15 MED ORDER — MELOXICAM 7.5 MG PO TABS: 7.5000 mg | ORAL_TABLET | Freq: Two times a day (BID) | ORAL | 2 refills | Status: DC

## 2023-01-17 ENCOUNTER — Encounter: Payer: Self-pay | Admitting: Internal Medicine

## 2023-01-17 ENCOUNTER — Ambulatory Visit (INDEPENDENT_AMBULATORY_CARE_PROVIDER_SITE_OTHER): Payer: 59 | Admitting: Internal Medicine

## 2023-01-17 VITALS — BP 136/82 | HR 82 | Ht 70.0 in | Wt 293.0 lb

## 2023-01-17 DIAGNOSIS — E2749 Other adrenocortical insufficiency: Secondary | ICD-10-CM | POA: Diagnosis not present

## 2023-01-17 DIAGNOSIS — M2550 Pain in unspecified joint: Secondary | ICD-10-CM

## 2023-01-17 MED ORDER — HYDROCORTISONE 5 MG PO TABS
ORAL_TABLET | ORAL | 2 refills | Status: DC
Start: 2023-01-17 — End: 2023-10-24

## 2023-01-17 NOTE — Progress Notes (Unsigned)
Name: [name] [name]  MRN/ DOB: [id number], Jul 22, 1966    Age/ Sex: 57 y.o., male    PCP: Olive Bass, FNP   Reason for Endocrinology Evaluation: Adrenal insufficieny      Date of Initial Endocrinology Evaluation: 01/17/2023     HPI: [name] [name] is a 57 y.o. male with a past medical history of Asthma . The patient presented for initial endocrinology clinic visit on 01/17/2023 for consultative assistance with his adrenal insufficiency.   Pt has been referred here for further evaluation of joint pain and concerns about adrenal insufficiency  Pt received left hip injection 6/4/204  Pt with Asthma and eosinophilia , has been on glucocorticoids for years Has noted easy bruising    Has been having  pain in the hips and other joint aches and pains  Has noted weight gain  Denies nausea or diarrhea  Denies constipation  Energy is stable  Has been limited on exercise due to pain  Has noted left parotid swelling intermittently    Started tapering the dose a year ago Prednisone 5 mg alternating 2.5 mg   He is also on Meloxicam   He is a Marine scientist    HISTORY:  Past Medical History:  Past Medical History:  Diagnosis Date   Allergic rhinitis    Allergy    seasonal allergies   Angio-edema    Cancer (HCC)    basal cell-nose   Complication of anesthesia    difficulty waking up from lithotripsy and nasal surgery   Glaucoma    has occasional pressure issues in eyes   History of chronic bronchitis    History of kidney stones    Left ureteral stone    Mild obstructive sleep apnea    per study 02-09-2015  recommended oral appliance / cpap-- pt tried intolerant   Moderate persistent asthma    pulmologist-  dr young   Pneumonia 1994   Urticaria    Past Surgical History:  Past Surgical History:  Procedure Laterality Date   BIOPSY  07/18/2021   Procedure: BIOPSY;  Surgeon: Lemar Lofty., MD;  Location: [facility];  Service:  Gastroenterology;;   COLONOSCOPY  12/07/2019   polyps   COLONOSCOPY WITH PROPOFOL N/A 05/08/2020   Procedure: COLONOSCOPY WITH PROPOFOL;  Surgeon: Lemar Lofty., MD;  Location: [facility];  Service: Gastroenterology;  Laterality: N/A;   COLONOSCOPY WITH PROPOFOL N/A 07/18/2021   Procedure: COLONOSCOPY WITH PROPOFOL;  Surgeon: Meridee Score Netty Starring., MD;  Location: [facility];  Service: Gastroenterology;  Laterality: N/A;   CYSTO/  LEFT URETERAL STENT PLACEMENT  03-10-2002   CYSTOSCOPY W/ URETERAL STENT PLACEMENT Left 08/07/2015   Procedure: CYSTOSCOPY, Left Retrograde Pyelogram, Left Ureteral Stent Placement;  Surgeon: Bjorn Pippin, MD;  Location: [facility];  Service: Urology;  Laterality: Left;   CYSTOSCOPY/URETEROSCOPY/HOLMIUM LASER/STENT PLACEMENT Left 08/17/2015   Procedure: CYSTOSCOPY / LEFT URETEROSCOPY / HOLMIUM LASER LITHOTRIPSY;  Surgeon: Bjorn Pippin, MD;  Location: [facility];  Service: Urology;  Laterality: Left;   ENDOSCOPIC MUCOSAL RESECTION N/A 05/08/2020   Procedure: ENDOSCOPIC MUCOSAL RESECTION;  Surgeon: Meridee Score Netty Starring., MD;  Location: [facility]COPY;  Service: Gastroenterology;  Laterality: N/A;   ENDOSCOPIC MUCOSAL RESECTION N/A 07/18/2021   Procedure: ENDOSCOPIC MUCOSAL RESECTION;  Surgeon: Meridee Score Netty Starring., MD;  Location: [facility];  Service: Gastroenterology;  Laterality: N/A;   EXTRACORPOREAL SHOCK WAVE LITHOTRIPSY  2006   HEMOSTASIS CLIP PLACEMENT  05/08/2020   Procedure: HEMOSTASIS CLIP PLACEMENT;  Surgeon:  Mansouraty, Netty Starring., MD;  Location: [facility] END[facility];  Service: Gastroenterology;;   POLYPECTOMY  05/08/2020   Procedure: POLYPECTOMY;  Surgeon: Lemar Lofty., MD;  Location: [facility] ENDOSCOPY;  Service: Gastroenterology;;   POLYPECTOMY  07/18/2021   Procedure: POLYPECTOMY;  Surgeon: Lemar Lofty., MD;  Location: [facility];  Service: Gastroenterology;;   SEPTOPLASTY WITH ETHMOIDECTOMY, AND MAXILLARY ANTROSTOMY   09-24-2010   SINOSCOPY     STONE EXTRACTION WITH BASKET Left 08/17/2015   Procedure: STONE EXTRACTION WITH BASKET;  Surgeon: Bjorn Pippin, MD;  Location: [facility];  Service: Urology;  Laterality: Left;   SUBMUCOSAL LIFTING INJECTION  05/08/2020   Procedure: SUBMUCOSAL LIFTING INJECTION;  Surgeon: Meridee Score Netty Starring., MD;  Location: [facility] ENDOSCOPY;  Service: Gastroenterology;;   WISDOM TOOTH EXTRACTION  1994    Social History:  reports that he has never smoked. He has never used smokeless tobacco. He reports current alcohol use. He reports that he does not use drugs. Family History: family history includes Allergic rhinitis in his father; Asthma in his father; Diabetes (age of onset: 89) in his maternal grandfather; Other in his father.   HOME MEDICATIONS: Allergies as of 01/17/2023   No Known Allergies      Medication List        Accurate as of January 17, 2023  2:16 PM. If you have any questions, ask your nurse or doctor.          AeroChamber MV inhaler Use as instructed   amoxicillin-clavulanate 875-125 MG tablet Commonly known as: AUGMENTIN Take 1 tablet by mouth 2 (two) times daily.   azithromycin 250 MG tablet Commonly known as: ZITHROMAX 2 today then one daily   benzonatate 200 MG capsule Commonly known as: TESSALON Take 1 capsule (200 mg total) by mouth 3 (three) times daily as needed for cough.   budesonide-formoterol 160-4.5 MCG/ACT inhaler Commonly known as: Symbicort USE 2 INHALATIONS BY MOUTH TWICE DAILY ; RINSE MOUTH AFTER USE   clonazePAM 1 MG tablet Commonly known as: KLONOPIN 1 OR 2 TABLETS BEFORE BED FOR SLEEP   clorazepate 7.5 MG tablet Commonly known as: TRANXENE TAKE 1 TO 2 TABLETS BY MOUTH AT  NIGHT AS NEEDED FOR SLEEP   Compressor Nebulizer Misc 1 Device by Does not apply route 4 (four) times daily as needed.   Fasenra Pen 30 MG/ML prefilled autoinjector Generic drug: benralizumab INJECT 30MG  SUBCUTANEOUSLY EVERY 8  WEEKS   fexofenadine 180 MG tablet Commonly known as: ALLEGRA Take 90 mg by mouth daily.   Fish Oil 1200 MG Caps Take 1,200 mg by mouth daily.   fluticasone 50 MCG/ACT nasal spray Commonly known as: FLONASE USE 2 SPRAYS IN BOTH NOSTRILS  DAILY   ipratropium-albuterol 0.5-2.5 (3) MG/3ML Soln Commonly known as: DUONEB USE 1 VIAL VIA NEBULIZER 4 TIMES DAILY AS NEEDED   meloxicam 7.5 MG tablet Commonly known as: Mobic Take 1 tablet (7.5 mg total) by mouth 2 (two) times daily.   montelukast 10 MG tablet Commonly known as: SINGULAIR TAKE 1 TABLET BY MOUTH DAILY   predniSONE 5 MG tablet Commonly known as: DELTASONE TAKE 1 TABLET BY MOUTH DAILY OR  AS DIRECTED What changed: Another medication with the same name was removed. Continue taking this medication, and follow the directions you see here. Changed by: Scarlette Shorts, MD   Proventil HFA 108 (90 Base) MCG/ACT inhaler Generic drug: albuterol Inhale 1-2 puffs into the lungs every 6 (six) hours as needed for wheezing or shortness of breath.  QC TUMERIC COMPLEX PO Take by mouth.   sildenafil 100 MG tablet Commonly known as: Viagra Take 1 tablet (100 mg total) by mouth daily as needed for erectile dysfunction.   sildenafil 100 MG tablet Commonly known as: Viagra Take 0.5-1 tablets (50-100 mg total) by mouth daily as needed for erectile dysfunction.          REVIEW OF SYSTEMS: A comprehensive ROS was conducted with the patient and is negative except as per HPI     OBJECTIVE:  VS: BP 136/82 (BP Location: Left Arm, Patient Position: Sitting, Cuff Size: Large)   Pulse 82   Ht 5\' 10"  (1.778 m)   Wt 293 lb (132.9 kg)   SpO2 96%   BMI 42.04 kg/m    Wt Readings from Last 3 Encounters:  01/17/23 293 lb (132.9 kg)  10/10/22 291 lb 3.2 oz (132.1 kg)  05/21/22 283 lb (128.4 kg)   Body surface area is 2.56 meters squared.   EXAM: General: Pt appears well and is in NAD  Eyes: External eye exam normal without  stare, lid lag or exophthalmos.  EOM intact.  PERRL.  Neck: General: Supple without adenopathy. Thyroid: Thyroid size normal.  No goiter or nodules appreciated. No thyroid bruit.  Lungs: Clear with good BS bilat   Heart: Auscultation: RRR.  Abdomen: Soft, nontender  Extremities:  BL LE: No pretibial edema   Mental Status: Judgment, insight: Intact Orientation: Oriented to time, place, and person Mood and affect: No depression, anxiety, or agitation     DATA REVIEWED: ***    ASSESSMENT/PLAN/RECOMMENDATIONS:   Adrenal Insufficiency :     Medications :  Signed electronically by: Lyndle Herrlich, MD  [facility]  [facility] [address] [place] [place], [place] [address] Phone: [phone] FAX: [phone]   CC: Olive Bass, FNP [address] [place] [address] Phone: [phone] Fax: [phone]   Return to Endocrinology clinic as below: Future Appointments  Date Time Provider Department Center  04/17/2023 10:30 AM Waymon Budge, MD [facility] None

## 2023-01-17 NOTE — Patient Instructions (Signed)
Stop Prednisone  Start Hydrocortisone 5 mg, 3 tablets with Breakfast and 1 tablet between 2-4 pm

## 2023-01-31 LAB — CORTISOL: Cortisol, Plasma: 4.1 ug/dL

## 2023-01-31 LAB — VITAMIN D 25 HYDROXY (VIT D DEFICIENCY, FRACTURES): Vit D, 25-Hydroxy: 30 ng/mL (ref 30–100)

## 2023-01-31 LAB — EXTRA SPECIMEN

## 2023-01-31 LAB — TSH: TSH: 0.92 mIU/L (ref 0.40–4.50)

## 2023-01-31 LAB — ACTH

## 2023-01-31 LAB — T4, FREE: Free T4: 1 ng/dL (ref 0.8–1.8)

## 2023-01-31 LAB — VITAMIN B12: Vitamin B-12: 417 pg/mL (ref 200–1100)

## 2023-02-21 ENCOUNTER — Telehealth: Payer: Self-pay | Admitting: Internal Medicine

## 2023-02-21 MED ORDER — NIRMATRELVIR/RITONAVIR (PAXLOVID)TABLET: 3.0000 | ORAL_TABLET | Freq: Two times a day (BID) | ORAL | 0 refills | Status: DC

## 2023-02-21 NOTE — Telephone Encounter (Signed)
pt.  has  a [facility]. to [facility] and wants to get  a script for paxlovid please advise

## 2023-02-21 NOTE — Telephone Encounter (Signed)
Spoke with patient and he states he was exposed to COVID by his nephew and son, they tested positive Wed/Thu. He was in a car with his son for on Tuesday. He is concerned about developing symptoms over the weekend. He is requesting Paxlovid just in case. Patient advised this is not a preventive medication. Patient denies any current s/s.   Please advise, thank you!

## 2023-02-21 NOTE — Telephone Encounter (Signed)
Called and spoke with patient. He is aware to only pick up the prescription if he tests positive. [facility] [address] RX has been sent.   Nothing further needed at time of call.

## 2023-02-21 NOTE — Telephone Encounter (Signed)
Ok to send script for paxlovid. Do not take if not sick.

## 2023-02-21 NOTE — Telephone Encounter (Signed)
Pt. Calling back for the 2 time waiting on response from Dr.

## 2023-02-22 MED ORDER — NIRMATRELVIR/RITONAVIR (PAXLOVID)TABLET: 3.0000 | ORAL_TABLET | Freq: Two times a day (BID) | ORAL | 0 refills | Status: AC

## 2023-02-22 NOTE — Addendum Note (Signed)
Addended by: Luciano Cutter on: 02/22/2023 08:40 PM   Modules accepted: Orders

## 2023-02-22 NOTE — Telephone Encounter (Signed)
Paxlovid sent to [facility] on [address]. Nothing further needed at time of call.

## 2023-03-03 ENCOUNTER — Other Ambulatory Visit: Payer: Self-pay | Admitting: Internal Medicine

## 2023-03-03 DIAGNOSIS — J4550 Severe persistent asthma, uncomplicated: Secondary | ICD-10-CM

## 2023-03-03 NOTE — Telephone Encounter (Signed)
Fasenra refilled through [facility]

## 2023-03-06 ENCOUNTER — Other Ambulatory Visit: Payer: Self-pay | Admitting: Internal Medicine

## 2023-03-07 NOTE — Telephone Encounter (Signed)
Tranxene refilled

## 2023-03-15 ENCOUNTER — Other Ambulatory Visit: Payer: Self-pay | Admitting: Orthopedic Surgery

## 2023-03-27 ENCOUNTER — Other Ambulatory Visit: Payer: Self-pay | Admitting: Internal Medicine

## 2023-04-02 ENCOUNTER — Ambulatory Visit (INDEPENDENT_AMBULATORY_CARE_PROVIDER_SITE_OTHER): Payer: 59 | Admitting: Orthopedic Surgery

## 2023-04-02 VITALS — Wt 287.8 lb

## 2023-04-02 DIAGNOSIS — M1612 Unilateral primary osteoarthritis, left hip: Secondary | ICD-10-CM | POA: Diagnosis not present

## 2023-04-02 MED ORDER — MELOXICAM 7.5 MG PO TABS: 7.5000 mg | ORAL_TABLET | Freq: Two times a day (BID) | ORAL | 1 refills | Status: AC

## 2023-04-02 NOTE — Progress Notes (Signed)
Orthopedic Spine Surgery Office Note   Assessment: Patient is a 57 y.o. male with chronic, progressively worsening left hip pain. XR shows degenerative changes in the joint. Diagnosis: Left hip OA     Plan: -Patient has tried activity modification, Tylenol, steroid injection  -Mobic was prescribed to him today since he finds that it takes the edge off in terms of pain -Told him that at this point his pain is affecting him on a daily basis and his daily activities and conservative treatments are not working, so I told him that he should consider hip arthroplasty -A referral was provided to my arthroplasty colleagues -Encouraged him to continue to work on weight loss to reduce his risk for complication with hip surgery -Patient should return to office on an as-needed basis     Patient expressed understanding of the plan and all questions were answered to the patient's satisfaction.    ___________________________________________________________________________   History: Patient is a 57 y.o. male who has been previously seen in the office for symptoms of left hip and buttock pain.  Pain has gotten progressively worse since the last time I saw him.  Injections are no longer providing him with significant or lasting relief.  He said he got some relief for about 2 weeks after his last injection with Dr. Shon Baton.  Of note, he has now tapered completely off of prednisone.  His endocrinologist also recommended against further steroid injections.  Pain is felt on a daily basis.  It is located in the groin and on the left side.  He is walking with a limp and has used a cane to help with the pain.  He is having difficulty even just walking around his house because of the pain.   Previous treatments: activity modification, Tylenol, steroid injection     Physical Exam:   General: no acute distress, appears stated age Neurologic: alert, answering questions appropriately, following commands Respiratory:  unlabored breathing on room air, symmetric chest rise Psychiatric: appropriate affect, normal cadence to speech     MSK (spine):   -Strength exam                                                   Left                  Right EHL                              5/5                  5/5 TA                                 5/5                  5/5 GSC                             5/5                  5/5 Knee extension            5/5  5/5 Hip flexion                    5/5                  5/5   -Sensory exam                           Sensation intact to light touch in L3-S1 nerve distributions of bilateral lower extremities   -Left hip exam: Positive FADIR, negative FABER, pain with internal rotation at the hip with decreased internal rotation when compared to contralateral side, positive Stinchfield    Imaging: XR of the bilateral hips spine from 09/25/2022 was previously independently reviewed and interpreted, showing joint space narrowing and subchondral sclerosis in the left hip.  Early joint space narrowing and subchondral sclerosis in the right hip.  No fracture or dislocation.     Patient name: [name] [name] Patient MRN: [id number] Date of visit: 04/02/23

## 2023-04-03 ENCOUNTER — Ambulatory Visit (INDEPENDENT_AMBULATORY_CARE_PROVIDER_SITE_OTHER): Payer: 59 | Admitting: Orthopaedic Surgery

## 2023-04-03 ENCOUNTER — Encounter: Payer: Self-pay | Admitting: Orthopaedic Surgery

## 2023-04-03 VITALS — Ht 70.0 in | Wt 287.0 lb

## 2023-04-03 DIAGNOSIS — M1612 Unilateral primary osteoarthritis, left hip: Secondary | ICD-10-CM

## 2023-04-03 NOTE — Progress Notes (Signed)
Office Visit Note   Patient: [name] [name]           Date of Birth: 03-30-1966           MRN: [id number] Visit Date: 04/03/2023              Requested by: London Sheer, MD [address] [place],  [place] [address] PCP: Olive Bass, FNP   Assessment & Plan: Visit Diagnoses:  1. Primary osteoarthritis of left hip     Plan: Right knee is a very pleasant 57 year old gentleman with advanced left hip DJD with bone-on-bone joint space narrowing.  Findings reviewed with the patient and based on the findings I have recommended a left total hip replacement.  Risk benefits prognosis reviewed.  Since [name] [name] has been taken off of the prednisone [name] [name] has been able to make progress towards losing weight.  His goal weight is 278 pounds.  [name] [name] would like to look at early December for the surgery.  Eunice Blase will call the patient to confirm surgery time.  In the meantime we will need clearance from PCP.  Impression is severe left hip degenerative joint disease secondary to Osteoarthritis.  Imaging shows bone on bone joint space narrowing.  At this point, conservative treatments fail to provide any significant relief and the pain is severely affecting ADLs and quality of life.  Based on treatment options, the patient has elected to move forward with a hip replacement.  We have discussed the surgical risks that include but are not limited to infection, DVT, leg length discrepancy, numbness, tingling, incomplete relief of pain.  Recovery and prognosis were also reviewed.    Current anticoagulants: No antithrombotic Postop anticoagulation: Aspirin 81 mg Diabetic: No  Prior DVT/PE: No Tobacco use: No Clearances needed for surgery: Ria Clock PCP Anticipate discharge dispo: home   Follow-Up Instructions: No follow-ups on file.   Orders:  No orders of the defined types were placed in this encounter.  No orders of the defined types were placed in this encounter.     Procedures: No procedures  performed   Clinical Data: No additional findings.   Subjective: Chief Complaint  Patient presents with   Left Hip - Pain    HPI [name] [name] is a very pleasant 57 year old gentleman referral from Dr. Christell Constant for surgical consultation for left total hip replacement.  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  [name] [name] has experienced years of chronic left hip and groin pain that has gotten much worse in the last year.  This is causing problems with mobility and ADLs.  Has pain at night.  [name] [name] was on chronic prednisone for years due to bronchitis but now [name] [name] is off of prednisone but on hydrocortisone.  [name] [name] lives with his wife and son.  Review of Systems  Constitutional: Negative.   HENT: Negative.    Eyes: Negative.   Respiratory: Negative.    Cardiovascular: Negative.   Gastrointestinal: Negative.   Endocrine: Negative.   Genitourinary: Negative.   Skin: Negative.   Allergic/Immunologic: Negative.   Neurological: Negative.   Hematological: Negative.   Psychiatric/Behavioral: Negative.    All other systems reviewed and are negative.    Objective: Vital Signs: Ht 5\' 10"  (1.778 m)   Wt 287 lb (130.2 kg)   BMI 41.18 kg/m   Physical Exam Vitals and nursing note reviewed.  Constitutional:      Appearance: [name] [name] is well-developed.  HENT:     Head: Normocephalic and  atraumatic.  Eyes:     Pupils: Pupils are equal, round, and reactive to light.  Pulmonary:     Effort: Pulmonary effort is normal.  Abdominal:     Palpations: Abdomen is soft.  Musculoskeletal:        General: Normal range of motion.     Cervical back: Neck supple.  Skin:    General: Skin is warm.  Neurological:     Mental Status: [name] [name] is alert and oriented to person, place, and time.  Psychiatric:        Behavior: Behavior normal.        Thought Content: Thought content normal.        Judgment: Judgment normal.     Ortho Exam Examination of the left hip shows no trochanteric tenderness.   [name] [name] has pain with logroll and FADIR and positive Stinchfield sign.  No sciatic tension signs. Specialty Comments:  No specialty comments available.  Imaging: No results found.   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) 04/22/2022   Sialadenitis 06/11/2021   Hx of adenomatous colonic polyps 04/06/2021   History of colon polyps 03/01/2020   Abnormal colonoscopy 03/01/2020   Recurrent urticaria/pruritus 09/21/2019   History of nasal polyposis 09/21/2019   Contact dermatitis 07/01/2019   Acute kidney injury (HCC) 08/08/2015   Hyperkalemia 08/08/2015   Acute pyelonephritis    Left ureteral stone 08/07/2015   Pyonephrosis 08/07/2015   Acute renal insufficiency 08/07/2015   Nonallopathic lesion of thoracic region 03/15/2015   Lateral epicondylitis of right elbow 02/22/2015   Obstructive sleep apnea 01/28/2015   Triceps tendinitis 01/11/2015   Back pain 12/21/2014   Anxiety state 02/06/2014   Thrush of mouth and esophagus (HCC) 06/20/2011   Sinusitis, maxillary, chronic 11/18/2010   ALLERGIC CONJUNCTIVITIS 08/25/2007   Perennial allergic rhinitis 08/25/2007   Asthma-chronic obstructive pulmonary disease overlap syndrome 08/25/2007   Past Medical History:  Diagnosis Date   Allergic rhinitis    Allergy    seasonal allergies   Angio-edema    Cancer (HCC)    basal cell-nose   Complication of anesthesia    difficulty waking up from lithotripsy and nasal surgery   Glaucoma    has occasional pressure issues in eyes   History of chronic bronchitis    History of kidney stones    Left ureteral stone    Mild obstructive sleep apnea    per study 02-09-2015  recommended oral appliance / cpap-- pt tried intolerant   Moderate persistent asthma    pulmologist-  dr young   Pneumonia 1994   Urticaria     Family History   Problem Relation Age of Onset   Asthma Father    Other Father        bronchitis   Allergic rhinitis Father    Diabetes Maternal Grandfather 47       Heavy smoker   Eczema Neg Hx    Immunodeficiency Neg Hx    Urticaria Neg Hx    Colon polyps Neg Hx    Colon cancer Neg Hx    Esophageal cancer Neg Hx    Rectal cancer Neg Hx    Stomach cancer Neg Hx    Inflammatory bowel disease Neg Hx    Liver disease Neg Hx    Pancreatic cancer Neg Hx     Past Surgical History:  Procedure Laterality  Date   BIOPSY  07/18/2021   Procedure: BIOPSY;  Surgeon: Lemar Lofty., MD;  Location: [facility];  Service: Gastroenterology;;   COLONOSCOPY  12/07/2019   polyps   COLONOSCOPY WITH PROPOFOL N/A 05/08/2020   Procedure: COLONOSCOPY WITH PROPOFOL;  Surgeon: Lemar Lofty., MD;  Location: [facility] END[facility]COPY;  Service: Gastroenterology;  Laterality: N/A;   COLONOSCOPY WITH PROPOFOL N/A 07/18/2021   Procedure: COLONOSCOPY WITH PROPOFOL;  Surgeon: Meridee Score Netty Starring., MD;  Location: [facility];  Service: Gastroenterology;  Laterality: N/A;   CYSTO/  LEFT URETERAL STENT PLACEMENT  03-10-2002   CYSTOSCOPY W/ URETERAL STENT PLACEMENT Left 08/07/2015   Procedure: CYSTOSCOPY, Left Retrograde Pyelogram, Left Ureteral Stent Placement;  Surgeon: Bjorn Pippin, MD;  Location: [facility];  Service: Urology;  Laterality: Left;   CYSTOSCOPY/URETEROSCOPY/HOLMIUM LASER/STENT PLACEMENT Left 08/17/2015   Procedure: CYSTOSCOPY / LEFT URETEROSCOPY / HOLMIUM LASER LITHOTRIPSY;  Surgeon: Bjorn Pippin, MD;  Location: [facility];  Service: Urology;  Laterality: Left;   ENDOSCOPIC MUCOSAL RESECTION N/A 05/08/2020   Procedure: ENDOSCOPIC MUCOSAL RESECTION;  Surgeon: Meridee Score Netty Starring., MD;  Location: [facility];  Service: Gastroenterology;  Laterality: N/A;   ENDOSCOPIC MUCOSAL RESECTION N/A 07/18/2021   Procedure: ENDOSCOPIC MUCOSAL RESECTION;  Surgeon: Meridee Score Netty Starring., MD;  Location: [facility]  ENDOSCOPY;  Service: Gastroenterology;  Laterality: N/A;   EXTRACORPOREAL SHOCK WAVE LITHOTRIPSY  2006   HEMOSTASIS CLIP PLACEMENT  05/08/2020   Procedure: HEMOSTASIS CLIP PLACEMENT;  Surgeon: Lemar Lofty., MD;  Location[facility];  Service: Gastroenterology;;   POLYPECTOMY  05/08/2020   Procedure: POLYPECTOMY;  Surgeon: Lemar Lofty., MD;  Location: [facility] ENDOSCOPY;  Service: Gastroenterology;;   POLYPECTOMY  07/18/2021   Procedure: POLYPECTOMY;  Surgeon: Lemar Lofty., MD;  Location: [facility];  Service: Gastroenterology;;   SEPTOPLASTY WITH ETHMOIDECTOMY, AND MAXILLARY ANTROSTOMY  09-24-2010   SINOSCOPY     STONE EXTRACTION WITH BASKET Left 08/17/2015   Procedure: STONE EXTRACTION WITH BASKET;  Surgeon: Bjorn Pippin, MD;  Location: [facility];  Service: Urology;  Laterality: Left;   SUBMUCOSAL LIFTING INJECTION  05/08/2020   Procedure: SUBMUCOSAL LIFTING INJECTION;  Surgeon: Meridee Score Netty Starring., MD;  Location: [facility] ENDOSCOPY;  Service: Gastroenterology;;   WISDOM TOOTH EXTRACTION  1994   Social History   Occupational History   Not on file  Tobacco Use   Smoking status: Never   Smokeless tobacco: Never  Vaping Use   Vaping status: Never Used  Substance and Sexual Activity   Alcohol use: Yes    Comment: socially    Drug use: Never   Sexual activity: Yes

## 2023-04-07 ENCOUNTER — Telehealth: Payer: Self-pay | Admitting: Orthopedic Surgery

## 2023-04-07 ENCOUNTER — Telehealth: Payer: Self-pay | Admitting: Orthopaedic Surgery

## 2023-04-07 MED ORDER — TRAMADOL HCL 50 MG PO TABS: 50.0000 mg | ORAL_TABLET | Freq: Every day | ORAL | 0 refills | Status: DC | PRN

## 2023-04-07 NOTE — Telephone Encounter (Signed)
Sounds like he aggravated the hip arthritis.  Does he need a work note for some restrictions?

## 2023-04-07 NOTE — Addendum Note (Signed)
Addended by: Mayra Reel on: 04/07/2023 06:12 PM   Modules accepted: Orders

## 2023-04-07 NOTE — Telephone Encounter (Signed)
Pt called in stating he is having extreme pain in hip and medication he has now is not helping he was trying to left a casket at work and twisted his hip please advise

## 2023-04-07 NOTE — Telephone Encounter (Signed)
Spoke with patient. He has tried taking Tylenol and Meloxicam, which are not helping. Is there something else he can take? Also, patient is trying to reach [facility] about scheduling surgery.  Pharmacy- [facility] on [address]

## 2023-04-07 NOTE — Telephone Encounter (Signed)
I sent tramadol

## 2023-04-08 ENCOUNTER — Encounter: Payer: Self-pay | Admitting: Orthopaedic Surgery

## 2023-04-08 ENCOUNTER — Telehealth: Payer: Self-pay | Admitting: Orthopaedic Surgery

## 2023-04-08 NOTE — Telephone Encounter (Signed)
Patient called stating he spoke with the pharmacy and was told they can not fill a 10 day supply of Tramadol. Only a 7 day supply of the medication  can be approved. Patient asked if the Rx can be sent in for a 7 day supply of Tramadol?  The number to contact patient is [phone]

## 2023-04-09 ENCOUNTER — Other Ambulatory Visit: Payer: Self-pay | Admitting: Physician Assistant

## 2023-04-09 MED ORDER — TRAMADOL HCL 50 MG PO TABS: 50.0000 mg | ORAL_TABLET | Freq: Every day | ORAL | 0 refills | Status: DC | PRN

## 2023-04-09 NOTE — Telephone Encounter (Signed)
Called and advised.

## 2023-04-09 NOTE — Telephone Encounter (Signed)
fixed

## 2023-04-11 NOTE — Telephone Encounter (Signed)
He will need OV to do surgical clearance for hip; last seen here 04/2022; schedule at his convenience please.

## 2023-04-14 NOTE — Telephone Encounter (Signed)
Pt scheduled w/ you on 05/20/23.

## 2023-04-16 NOTE — Progress Notes (Unsigned)
Patient ID: [name] [name], male    DOB: 02-Jul-1966, 57 y.o.   MRN: [id number]  HPI male never smoker followed for chronic obstructive asthma, OSA/quit CPAP, rhinitis/chronic sinusitis Unattended Home Sleep Test 02/09/15- mild OSA/ AHI 9.3/ hr, desat to 85%, weight 276 lbs PFT 03/06/2007 PFT 01/29/2011 Office Spirometry 02/03/2014-severe obstructive airways disease with low FVC.  FVC 3.13/62%, FEV1 2.01/50%, ratio 0.64,  FEF 25-75% 0.87/22% CT sinus 11/23/2009-moderate chronic appearing pansinusitis. Surgery 09/24/2010-polypoid chronic sinusitis CBC with differential 12/28/2015-eosinophils 7.3% CBC w diff 09/28/2018- steroid pattern EOS 0.2 k/ul FENO 10/02/16- 33 H Office spirometry 10/02/16-moderate restriction and obstruction. FVC 2.66/53%, FEV1 2.0/51%, ratio 0.75, FEF 25-75% 1.60/46% IgE 10/02/2016-295 PFT 01/29/2019- Mod obst, mild restrction, slight response to dilator, normal Diffusion Allergy Skin Testing 09/21/19- Dr Nunzio Cobbs- + dust mites --------------------------------------------------------------------------------------   10/10/22- 57 year old male never smoker followed for chronic obstructive asthma  (Asthma- COPD Overlap Syndrome), OSA/quit CPAP, rhinitis/chronic polypoid sinusitis, Urticaria, Colon polyps, -Prednisone 5 mg daily maint, singulair, xyzal, neb Duoneb, Allegra, Ventolin hfa, Symbicort 160, Fasenra, clonazepam,  Covid vax 3 Phizer Flu vax-today standard Body weight today-291 lbs -----Deep cough, congestion x 1 week  Had Mohs surgery for basal cell cancer on his left ear. Dealing with arthritis left hip and may need total hip replacement. Uses prednisone 5 mg daily.  Waiting for endocrinology appointment this summer. Had a "bad cold" in January which resolved. Taking Mucinex.  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  He continues Harrington Challenger which has helped.  04/17/23-  56 year old male never smoker followed for chronic obstructive asthma  (Asthma- COPD Overlap Syndrome), OSA/quit CPAP, rhinitis/chronic polypoid sinusitis, Urticaria, Colon polyps, -Prednisone 5 mg daily maint, singulair, xyzal, neb Duoneb, Allegra, Ventolin hfa, Symbicort 160, Fasenra, clonazepam,  Body weight today- Sent Paxlovid in August. 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 Now pending left hip surgery.  I suggested seasonal vaccinations and his understanding was the surgeon advised him to wait even though procedure is not planned before November.  I suggested he contact the surgeons office and verify that they want him to stay off vaccinations for now. He is seen endocrinology for adrenal suppression by steroids.  They have changed him to hydrocortisone as expected and are trying for gradual withdrawal. Chest x-ray in March was abnormal and we will repeat today for comparison.  He has little cough or wheeze currently. I noted anisocoria-right pupil larger than the left.  He says this comes and goes and his ophthalmologist is aware of it. CXR 10/11/22-  IMPRESSION: Reticulonodular opacities with bronchial wall thickening, potentially bronchitis or other atypical infection.  Review of Systems-See HPI       + = positive Constitutional:    No- night sweats ,fevers, chills, fatigue, lassitude. HEENT:   No - headaches,  Difficulty swallowing, Tooth/dental problems, Sore throat,                No sneezing, itching, ear ache, CV:  No chest pain,  Orthopnea, PND, swelling in lower extremities, anasarca, dizziness, palpitations GI  No heartburn, indigestion, abdominal pain, nausea, vomiting,  Resp:.See HPI    +productive cough   No coughing up of blood.  change in color of mucus,  + wheeze Skin: + HPI GU: . MS:  No joint pain or swelling.  [name] Psych:   change in mood or affect.  depression  and  anxiety.  No memory loss.   Objective:   Physical  Exam General- Alert, Oriented, Affect-appropriate, Distress- none acute, + Obese / thick neck Skin- + birthmark R hand Lymphadenopathy- none Head- atraumatic            Eyes- + anisocoria R>L            Ears- Hearing, canals normal            Nose- Clear, no-Septal dev, mucus, polyps, erosion, perforation             Throat- Mallampati  III- IV , mucosa+ geographic, drainage- none, tonsils- atrophic. , Neck- flexible , trachea midline, no stridor , thyroid nl, carotid no bruit.  No obvious tenderness or mass. Chest - symmetrical excursion , unlabored           Heart/CV- RRR , no murmur , no gallop  , no rub, nl s1 s2                           - JVD- none , edema- none, stasis changes- none, varices- none           Lung-  + wheeze and squeaks diffusely,  cough+, dullness-none, rub- none           Chest wall-  Abd-  Br/ Gen/ Rectal- Not done, not indicated Extrem- cyanosis- none, clubbing, none, atrophy- none, strength- nl Neuro- grossly intact to observation

## 2023-04-17 ENCOUNTER — Ambulatory Visit: Payer: 59 | Admitting: Internal Medicine

## 2023-04-17 ENCOUNTER — Encounter: Payer: Self-pay | Admitting: Internal Medicine

## 2023-04-17 ENCOUNTER — Ambulatory Visit: Payer: 59

## 2023-04-17 VITALS — BP 118/82 | HR 77 | Temp 97.4°F | Ht 70.0 in | Wt 283.0 lb

## 2023-04-17 DIAGNOSIS — J42 Unspecified chronic bronchitis: Secondary | ICD-10-CM

## 2023-04-17 DIAGNOSIS — E273 Drug-induced adrenocortical insufficiency: Secondary | ICD-10-CM

## 2023-04-17 DIAGNOSIS — J4489 Other specified chronic obstructive pulmonary disease: Secondary | ICD-10-CM | POA: Diagnosis not present

## 2023-04-17 NOTE — Assessment & Plan Note (Signed)
Now on hydrocortisone.  Endocrinology is working with him.

## 2023-04-17 NOTE — Patient Instructions (Signed)
We can continue current meds  Order- CXR   dx chronic bronchitis

## 2023-04-17 NOTE — Assessment & Plan Note (Addendum)
[name] [name] has been a big help. Endocrinology is working to try to wean him off of steroids and resume adrenal function. Plan-update chest x-ray for comparison with March study.

## 2023-04-24 ENCOUNTER — Ambulatory Visit: Payer: 59 | Admitting: Internal Medicine

## 2023-04-24 ENCOUNTER — Encounter: Payer: Self-pay | Admitting: Internal Medicine

## 2023-04-24 VITALS — BP 140/86 | HR 82 | Ht 70.0 in | Wt 281.0 lb

## 2023-04-24 DIAGNOSIS — R7303 Prediabetes: Secondary | ICD-10-CM

## 2023-04-24 DIAGNOSIS — E2749 Other adrenocortical insufficiency: Secondary | ICD-10-CM

## 2023-04-24 LAB — BASIC METABOLIC PANEL
BUN: 29 mg/dL — ABNORMAL HIGH (ref 6–23)
CO2: 28 meq/L (ref 19–32)
Calcium: 9.6 mg/dL (ref 8.4–10.5)
Chloride: 106 meq/L (ref 96–112)
Creatinine, Ser: 1.1 mg/dL (ref 0.40–1.50)
GFR: 74.7 mL/min (ref >60.00)
Glucose, Bld: 103 mg/dL — ABNORMAL HIGH (ref 70–99)
Potassium: 4.9 meq/L (ref 3.5–5.1)
Sodium: 143 meq/L (ref 135–145)

## 2023-04-24 LAB — HEMOGLOBIN A1C: Hgb A1c MFr Bld: 5.8 % (ref 4.6–6.5)

## 2023-04-24 LAB — VITAMIN D 25 HYDROXY (VIT D DEFICIENCY, FRACTURES): VITD: 38.34 ng/mL (ref 30.00–100.00)

## 2023-04-24 LAB — CORTISOL: Cortisol, Plasma: 4.2 ug/dL

## 2023-04-24 MED ORDER — SEMAGLUTIDE(0.25 OR 0.5MG/DOS) 2 MG/3ML ~~LOC~~ SOPN: 0.5000 mg | PEN_INJECTOR | SUBCUTANEOUS | Status: DC

## 2023-04-24 NOTE — Patient Instructions (Addendum)
Start Ozempic temporarily to help with weight loss before the surgery ( stop it 2 weeks before the surgery )  Ozempic 0.25 mg weekly for 6 weeks  Continue Hydrocortisone 5 mg, 3 tablets with breakfast and 1 tablet in the afternoon   ADRENAL INSUFFICIENCY SICK DAY RULES:  Should you face an extreme emotional or physical stress such as trauma, surgery or acute illness, this will require extra steroid coverage so that the body can meet that stress.   Without increasing the steroid dose you may experience severe weakness, headache, dizziness, nausea and vomiting and possibly a more serious deterioration in health.  Typically the dose of steroids will only need to be increased for a couple of days if you have an illness that is transient and managed in the community.   If you are unable to take/absorb an increased dose of steroids orally because of vomiting or diarrhea, you will urgently require steroid injections and should present to an Emergency Department.  The general advice for any serious illness is as follows: Double the normal daily steroid dose for up to 3 days if you have a temperature of more than 37.50C (99.1F) with signs of sickness, or severe emotional or physical distress Contact your primary care doctor and Endocrinologist if the illness worsens or it lasts for more than 3 days.  In cases of severe illness, urgent medical assistance should be promptly sought. If you experience vomiting/diarrhea or are unable to take steroids by mouth, please administer the Hydrocortisone injection kit and seek urgent medical help.

## 2023-04-24 NOTE — Progress Notes (Signed)
Name: [name] [name]  MRN/ DOB: [id number], Nov 26, 1965    Age/ Sex: 57 y.o., male    PCP: Olive Bass, FNP   Reason for Endocrinology Evaluation: Adrenal insufficieny      Date of Initial Endocrinology Evaluation: 01/17/2023    HPI: [name] is a 57 y.o. male with a past medical history of Asthma . The patient presented for initial endocrinology clinic visit on 04/24/2023 for consultative assistance with his adrenal insufficiency.   Pt has been referred here for further evaluation of joint pain and concerns about adrenal insufficiency  Pt received left hip injection 6/4/204  Pt with Asthma and eosinophilia , has been on glucocorticoids for years Has noted easy bruising   On his initial visit to our clinic he was on prednisone 5 mg alternating 2.5 mg , I switched him to hydrocortisone  He is a Marine scientist  SUBJECTIVE:    Today (04/24/23):[name] [name] is here for follow-up on adrenal insufficiency  He is s/p Mohs surgery for basal cell carcinoma of his left ear  He is scheduled for left hip arthroplasty 06/23/2023  The patient has been noted with weight loss since his last visit to our clinic  Denies nausea or vomiting  Has neck pain and joint popping  Denies diarrhea but has minimal constipation  Twisted his leg 3 weeks ago  Denies insomnia except for hip pain    He is not on meloxican due to lack of effectiveness  Continue hydrocal  HC 5 mg, 3 tabs with Breakfast and 1 tab between 2-4 pm daily   Vitamin D 3 1000 international unit daily   HISTORY:  Past Medical History:  Past Medical History:  Diagnosis Date   Allergic rhinitis    Allergy    seasonal allergies   Angio-edema    Cancer (HCC)    basal cell-nose   Complication of anesthesia    difficulty waking up from lithotripsy and nasal surgery   Glaucoma    has occasional pressure issues in eyes   History of chronic bronchitis    History of kidney stones    Left  ureteral stone    Mild obstructive sleep apnea    per study 02-09-2015  recommended oral appliance / cpap-- pt tried intolerant   Moderate persistent asthma    pulmologist-  dr young   Pneumonia 1994   Urticaria    Past Surgical History:  Past Surgical History:  Procedure Laterality Date   BIOPSY  07/18/2021   Procedure: BIOPSY;  Surgeon: Lemar Lofty., MD;  Location: [facility];  Service: Gastroenterology;;   COLONOSCOPY  12/07/2019   polyps   COLONOSCOPY WITH PROPOFOL N/A 05/08/2020   Procedure: COLONOSCOPY WITH PROPOFOL;  Surgeon: Lemar Lofty., MD;  Location: [facility] ENDOSCOPY;  Service: Gastroenterology;  Laterality: N/A;   COLONOSCOPY WITH PROPOFOL N/A 07/18/2021   Procedure: COLONOSCOPY WITH PROPOFOL;  Surgeon: Meridee Score Netty Starring., MD;  Location: [facility];  Service: Gastroenterology;  Laterality: N/A;   CYSTO/  LEFT URETERAL STENT PLACEMENT  03-10-2002   CYSTOSCOPY W/ URETERAL STENT PLACEMENT Left 08/07/2015   Procedure: CYSTOSCOPY, Left Retrograde Pyelogram, Left Ureteral Stent Placement;  Surgeon: Bjorn Pippin, MD;  Location: [facility];  Service: Urology;  Laterality: Left;   CYSTOSCOPY/URETEROSCOPY/HOLMIUM LASER/STENT PLACEMENT Left 08/17/2015   Procedure: CYSTOSCOPY / LEFT URETEROSCOPY / HOLMIUM LASER LITHOTRIPSY;  Surgeon: Bjorn Pippin, MD;  Location: [facility];  Service: Urology;  Laterality: Left;   ENDOSCOPIC MUCOSAL RESECTION  N/A 05/08/2020   Procedure: ENDOSCOPIC MUCOSAL RESECTION;  Surgeon: Meridee Score Netty Starring., MD;  Location: [facility] ENDOSCOPY;  Service: Gastroenterology;  Laterality: N/A;   ENDOSCOPIC MUCOSAL RESECTION N/A 07/18/2021   Procedure: ENDOSCOPIC MUCOSAL RESECTION;  Surgeon: Meridee Score Netty Starring., MD;  Location: [facility];  Service: Gastroenterology;  Laterality: N/A;   EXTRACORPOREAL SHOCK WAVE LITHOTRIPSY  2006   HEMOSTASIS CLIP PLACEMENT  05/08/2020   Procedure: HEMOSTASIS CLIP PLACEMENT;  Surgeon: Lemar Lofty., MD;  Location: [facility] ENDOSCOPY;  Service: Gastroenterology;;   POLYPECTOMY  05/08/2020   Procedure: POLYPECTOMY;  Surgeon: Lemar Lofty., MD;  Location: [facility] ENDOSCOPY;  Service: Gastroenterology;;   POLYPECTOMY  07/18/2021   Procedure: POLYPECTOMY;  Surgeon: Lemar Lofty., MD;  Location: [facility];  Service: Gastroenterology;;   SEPTOPLASTY WITH ETHMOIDECTOMY, AND MAXILLARY ANTROSTOMY  09-24-2010   SINOSCOPY     STONE EXTRACTION WITH BASKET Left 08/17/2015   Procedure: STONE EXTRACTION WITH BASKET;  Surgeon: Bjorn Pippin, MD;  Location: [facility];  Service: Urology;  Laterality: Left;   SUBMUCOSAL LIFTING INJECTION  05/08/2020   Procedure: SUBMUCOSAL LIFTING INJECTION;  Surgeon: Meridee Score Netty Starring., MD;  Location: [facility] ENDOSCOPY;  Service: Gastroenterology;;   WISDOM TOOTH EXTRACTION  1994    Social History:  reports that he has never smoked. He has never used smokeless tobacco. He reports current alcohol use. He reports that he does not use drugs. Family History: family history includes Allergic rhinitis in his father; Asthma in his father; Diabetes (age of onset: 78) in his maternal grandfather; Other in his father.   HOME MEDICATIONS: Allergies as of 04/24/2023   No Known Allergies      Medication List        Accurate as of April 24, 2023 12:02 PM. If you have any questions, ask your nurse or doctor.          AeroChamber MV inhaler Use as instructed   amoxicillin-clavulanate 875-125 MG tablet Commonly known as: AUGMENTIN Take 1 tablet by mouth 2 (two) times daily.   azithromycin 250 MG tablet Commonly known as: ZITHROMAX 2 today then one daily   benzonatate 200 MG capsule Commonly known as: TESSALON Take 1 capsule (200 mg total) by mouth 3 (three) times daily as needed for cough.   budesonide-formoterol 160-4.5 MCG/ACT inhaler Commonly known as: Symbicort USE 2 INHALATIONS BY MOUTH TWICE DAILY RINSE MOUTH AFTER USE    clonazePAM 1 MG tablet Commonly known as: KLONOPIN 1 OR 2 TABLETS BEFORE BED FOR SLEEP   clorazepate 7.5 MG tablet Commonly known as: TRANXENE TAKE 1 TO 2 TABLETS BY MOUTH AT  NIGHT AS NEEDED FOR SLEEP   Compressor Nebulizer Misc 1 Device by Does not apply route 4 (four) times daily as needed.   Fasenra Pen 30 MG/ML prefilled autoinjector Generic drug: benralizumab INJECT 30MG  SUBCUTANEOUSLY EVERY 8 WEEKS   fexofenadine 180 MG tablet Commonly known as: ALLEGRA Take 90 mg by mouth daily.   Fish Oil 1200 MG Caps Take 1,200 mg by mouth daily.   fluticasone 50 MCG/ACT nasal spray Commonly known as: FLONASE USE 2 SPRAYS IN BOTH NOSTRILS  DAILY   hydrocortisone 5 MG tablet Commonly known as: CORTEF Take 3 tablets (15 mg total) by mouth daily with breakfast AND 1 tablet (5 mg total) daily with supper.   ipratropium-albuterol 0.5-2.5 (3) MG/3ML Soln Commonly known as: DUONEB USE 1 VIAL VIA NEBULIZER 4 TIMES DAILY AS NEEDED   meloxicam 7.5 MG tablet Commonly known as: MOBIC Take 1  tablet (7.5 mg total) by mouth 2 (two) times daily.   montelukast 10 MG tablet Commonly known as: SINGULAIR TAKE 1 TABLET BY MOUTH DAILY   Proventil HFA 108 (90 Base) MCG/ACT inhaler Generic drug: albuterol Inhale 1-2 puffs into the lungs every 6 (six) hours as needed for wheezing or shortness of breath.   QC TUMERIC COMPLEX PO Take by mouth.   sildenafil 100 MG tablet Commonly known as: Viagra Take 1 tablet (100 mg total) by mouth daily as needed for erectile dysfunction.   sildenafil 100 MG tablet Commonly known as: Viagra Take 0.5-1 tablets (50-100 mg total) by mouth daily as needed for erectile dysfunction.   traMADol 50 MG tablet Commonly known as: ULTRAM Take 1-2 tablets (50-100 mg total) by mouth daily as needed.          REVIEW OF SYSTEMS: A comprehensive ROS was conducted with the patient and is negative except as per HPI     OBJECTIVE:  VS: BP 138/84 (BP Location:  Left Arm, Patient Position: Sitting, Cuff Size: Large)   Pulse 82   Ht 5\' 10"  (1.778 m)   Wt 281 lb (127.5 kg)   SpO2 95%   BMI 40.32 kg/m    Wt Readings from Last 3 Encounters:  04/24/23 281 lb (127.5 kg)  04/17/23 283 lb (128.4 kg)  04/03/23 287 lb (130.2 kg)   Body surface area is 2.51 meters squared.    EXAM: General: Pt appears well and is in NAD  Neck: General: Supple without adenopathy. Thyroid: Thyroid size normal.  No goiter or nodules appreciated.  Lungs: Clear with good BS bilat   Heart: Auscultation: RRR.  Extremities:  BL LE: No pretibial edema   Mental Status: Judgment, insight: Intact Orientation: Oriented to time, place, and person Mood and affect: No depression, anxiety, or agitation     DATA REVIEWED:   Latest Reference Range & Units 04/24/23 12:36  Sodium 135 - 145 mEq/L 143  Potassium 3.5 - 5.1 mEq/L 4.9  Chloride 96 - 112 mEq/L 106  CO2 19 - 32 mEq/L 28  Glucose 70 - 99 mg/dL 161 (H)  BUN 6 - 23 mg/dL 29 (H)  Creatinine 0.96 - 1.50 mg/dL 0.45  Calcium 8.4 - 40.9 mg/dL 9.6  GFR >81.19 mL/min 74.70    Latest Reference Range & Units 04/24/23 12:36  VITD 30.00 - 100.00 ng/mL 38.34    Latest Reference Range & Units 04/24/23 12:36  Cortisol, Plasma ug/dL 4.2  Glucose 70 - 99 mg/dL 147 (H)  Hemoglobin W2N 4.6 - 6.5 % 5.8  (H): Data is abnormally high     ASSESSMENT/PLAN/RECOMMENDATIONS:   Adrenal Insufficiency :  - This is most likely secondary in nature due to long term glucocorticoid use  -He has been off prednisone and on hydrocortisone since 12/2022 -I will proceed with ACTH checkup, unfortunately on his last visit this was canceled due to an error -I did explain to the patient that we would not start the process of weaning him off glucocorticoids until after his surgery, since surgery is considered a stressful condition, he will need stress dose steroids perioperatively and in the immediate postoperative period per anesthesiology  protocol  Medications : Continue  HC 5 mg, 3 tabs with Breakfast and 1 tab between 2-4 pm daily    2. Pre-Diabetes :  -I have placed the patient on 12 pound weight loss over the past 3 months, the patient has been on a low-carb diet -He is scheduled to have  left hip surgery in December, his surgeon would like for him to lose more weight, I did offer Ozempic sample to be taken for the next 6 weeks to facilitate the weight loss journey -He was advised to stop it 2 weeks before the surgery -A1c 5.8%, a prescription for Ozempic will be sent to his pharmacy to see if this will be covered.  Patient was cautioned against GI side effects  Medication Ozempic 0.5 mg weekly  Follow-up in 4 months  Signed electronically by: Lyndle Herrlich, MD  [facility] Endocrinology  [facility] [address] [place], [place] [address] Phone: [phone] FAX: [phone]   CC: Olive Bass, FNP [address] [place] [address] Phone: [phone] Fax: [phone]   Return to Endocrinology clinic as below: Future Appointments  Date Time Provider Department Center  05/20/2023 10:20 AM Olive Bass, FNP [id number]  07/08/2023  1:00 PM Cristie Hem, PA-C [facility] None  09/29/2023 11:00 AM Waymon Budge, MD [facility] None

## 2023-04-25 DIAGNOSIS — R7303 Prediabetes: Secondary | ICD-10-CM | POA: Insufficient documentation

## 2023-04-25 MED ORDER — SEMAGLUTIDE(0.25 OR 0.5MG/DOS) 2 MG/3ML ~~LOC~~ SOPN: 0.5000 mg | PEN_INJECTOR | SUBCUTANEOUS | 3 refills | Status: DC

## 2023-04-29 LAB — ACTH: C206 ACTH: <5 pg/mL — ABNORMAL LOW (ref 6–50)

## 2023-05-15 ENCOUNTER — Encounter: Payer: Self-pay | Admitting: Family

## 2023-05-15 ENCOUNTER — Encounter: Payer: Self-pay | Admitting: Orthopaedic Surgery

## 2023-05-20 ENCOUNTER — Ambulatory Visit (INDEPENDENT_AMBULATORY_CARE_PROVIDER_SITE_OTHER): Payer: 59 | Admitting: Family

## 2023-05-20 ENCOUNTER — Encounter: Payer: Self-pay | Admitting: Family

## 2023-05-20 ENCOUNTER — Ambulatory Visit: Payer: 59 | Admitting: Family

## 2023-05-20 VITALS — BP 134/86 | HR 96 | Resp 18 | Ht 70.0 in | Wt 272.0 lb

## 2023-05-20 DIAGNOSIS — J4489 Other specified chronic obstructive pulmonary disease: Secondary | ICD-10-CM

## 2023-05-20 DIAGNOSIS — Z01818 Encounter for other preprocedural examination: Secondary | ICD-10-CM | POA: Diagnosis not present

## 2023-05-20 DIAGNOSIS — R7303 Prediabetes: Secondary | ICD-10-CM

## 2023-05-20 DIAGNOSIS — Z23 Encounter for immunization: Secondary | ICD-10-CM

## 2023-05-20 LAB — CBC WITH DIFFERENTIAL/PLATELET
Basophils Absolute: 0 10*3/uL (ref 0.0–0.1)
Basophils Relative: 0.1 % (ref 0.0–3.0)
Eosinophils Absolute: 0 10*3/uL (ref 0.0–0.7)
Eosinophils Relative: 0 % (ref 0.0–5.0)
HCT: 49.2 % (ref 39.0–52.0)
Hemoglobin: 15.7 g/dL (ref 13.0–17.0)
Lymphocytes Relative: 10.5 % — ABNORMAL LOW (ref 12.0–46.0)
Lymphs Abs: 1 10*3/uL (ref 0.7–4.0)
MCHC: 31.9 g/dL (ref 30.0–36.0)
MCV: 92.6 fL (ref 78.0–100.0)
Monocytes Absolute: 0.7 10*3/uL (ref 0.1–1.0)
Monocytes Relative: 7.8 % (ref 3.0–12.0)
Neutro Abs: 7.7 10*3/uL (ref 1.4–7.7)
Neutrophils Relative %: 81.6 % — ABNORMAL HIGH (ref 43.0–77.0)
Platelets: 259 10*3/uL (ref 150.0–400.0)
RBC: 5.31 Mil/uL (ref 4.22–5.81)
RDW: 14.1 % (ref 11.5–15.5)
WBC: 9.4 10*3/uL (ref 4.0–10.5)

## 2023-05-20 LAB — COMPREHENSIVE METABOLIC PANEL
ALT: 29 U/L (ref 0–53)
AST: 20 U/L (ref 0–37)
Albumin: 4.3 g/dL (ref 3.5–5.2)
Alkaline Phosphatase: 82 U/L (ref 39–117)
BUN: 23 mg/dL (ref 6–23)
CO2: 27 meq/L (ref 19–32)
Calcium: 9.4 mg/dL (ref 8.4–10.5)
Chloride: 105 meq/L (ref 96–112)
Creatinine, Ser: 1.13 mg/dL (ref 0.40–1.50)
GFR: 72.29 mL/min (ref >60.00)
Glucose, Bld: 98 mg/dL (ref 70–99)
Potassium: 4.5 meq/L (ref 3.5–5.1)
Sodium: 141 meq/L (ref 135–145)
Total Bilirubin: 0.5 mg/dL (ref 0.2–1.2)
Total Protein: 6.9 g/dL (ref 6.0–8.3)

## 2023-05-20 NOTE — Addendum Note (Signed)
Addended by: Judieth Keens on: 05/20/2023 11:25 AM   Modules accepted: Orders

## 2023-05-20 NOTE — Telephone Encounter (Signed)
Noted  

## 2023-05-20 NOTE — Progress Notes (Signed)
[name] [name] is a 57 y.o. 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) 04/22/2022   Sialadenitis 06/11/2021   Hx of adenomatous colonic polyps 04/06/2021   History of colon polyps 03/01/2020   Abnormal colonoscopy 03/01/2020   Recurrent urticaria/pruritus 09/21/2019   History of nasal polyposis 09/21/2019   Contact dermatitis 07/01/2019   Acute kidney injury (HCC) 08/08/2015   Hyperkalemia 08/08/2015   Acute pyelonephritis    Left ureteral stone 08/07/2015   Pyonephrosis 08/07/2015   Acute renal insufficiency 08/07/2015   Nonallopathic lesion of thoracic region 03/15/2015   Lateral epicondylitis of right elbow 02/22/2015   Obstructive sleep apnea 01/28/2015   Triceps tendinitis 01/11/2015   Back pain 12/21/2014   Anxiety state 02/06/2014   Thrush of mouth and esophagus (HCC) 06/20/2011   Sinusitis, maxillary, chronic 11/18/2010   ALLERGIC CONJUNCTIVITIS 08/25/2007   Perennial allergic rhinitis 08/25/2007   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     fluticasone (FLONASE) 50 MCG/ACT nasal spray USE 2 SPRAYS IN BOTH NOSTRILS  DAILY 48 g 3   hydrocortisone  (CORTEF) 5 MG tablet Take 3 tablets (15 mg total) by mouth daily with breakfast AND 1 tablet (5 mg total) daily with supper. 360 tablet 2   ipratropium-albuterol (DUONEB) 0.5-2.5 (3) MG/3ML SOLN USE 1 VIAL VIA NEBULIZER 4 TIMES DAILY AS NEEDED 1080 mL 5   meloxicam (MOBIC) 7.5 MG tablet Take 1 tablet (7.5 mg total) by mouth 2 (two) times daily. 60 tablet 1   montelukast (SINGULAIR) 10 MG tablet TAKE 1 TABLET BY MOUTH DAILY 90 tablet 3   Nebulizers (COMPRESSOR NEBULIZER) MISC 1 Device by Does not apply route 4 (four) times daily as needed. 1 each 0   Omega-3 Fatty Acids (FISH OIL) 1200 MG CAPS Take 1,200 mg by mouth daily.     PROVENTIL HFA 108 (90 Base) MCG/ACT inhaler Inhale 1-2 puffs into the lungs every 6 (six) hours as needed for wheezing or shortness of breath. 3 each 3   Semaglutide,0.25 or 0.5MG /DOS, 2 MG/3ML SOPN Inject 0.5 mg into the skin once a week.     Semaglutide,0.25 or 0.5MG /DOS, 2 MG/3ML SOPN Inject 0.5 mg into the skin once a week. 9 mL 3   sildenafil (VIAGRA) 100 MG tablet Take 1 tablet (100 mg total) by mouth daily as needed for erectile dysfunction. 15 tablet 3   sildenafil (VIAGRA) 100 MG tablet Take 0.5-1 tablets (50-100 mg total) by mouth daily as needed for erectile dysfunction. 5 tablet 2   Spacer/[name]-Holding Chambers (AEROCHAMBER MV) inhaler Use as instructed 1 each 0   traMADol (ULTRAM) 50 MG tablet Take 1-2 tablets (50-100 mg total) by mouth daily as  needed. 14 tablet 0   Turmeric (QC TUMERIC COMPLEX PO) Take by mouth.     No current facility-administered medications for this visit.    Allergies: Patient has no known allergies.  Past Medical History:  Diagnosis Date   Allergic rhinitis    Allergy    seasonal allergies   Angio-edema    Cancer (HCC)    basal cell-nose   Complication of anesthesia    difficulty waking up from lithotripsy and nasal surgery   Glaucoma    has occasional pressure issues in eyes   History of chronic bronchitis    History of kidney  stones    Left ureteral stone    Mild obstructive sleep apnea    per study 02-09-2015  recommended oral appliance / cpap-- pt tried intolerant   Moderate persistent asthma    pulmologist-  dr young   Pneumonia 1994   Urticaria     Past Surgical History:  Procedure Laterality Date   BIOPSY  07/18/2021   Procedure: BIOPSY;  Surgeon: Lemar Lofty., MD;  Location: [facility];  Service: Gastroenterology;;   COLONOSCOPY  12/07/2019   polyps   COLONOSCOPY WITH PROPOFOL N/A 05/08/2020   Procedure: COLONOSCOPY WITH PROPOFOL;  Surgeon: Lemar Lofty., MD;  Location: [facility]COPY;  Service: Gastroenterology;  Laterality: N/A;   COLONOSCOPY WITH PROPOFOL N/A 07/18/2021   Procedure: COLONOSCOPY WITH PROPOFOL;  Surgeon: Meridee Score Netty Starring., MD;  Location: [facility];  Service: Gastroenterology;  Laterality: N/A;   CYSTO/  LEFT URETERAL STENT PLACEMENT  03-10-2002   CYSTOSCOPY W/ URETERAL STENT PLACEMENT Left 08/07/2015   Procedure: CYSTOSCOPY, Left Retrograde Pyelogram, Left Ureteral Stent Placement;  Surgeon: Bjorn Pippin, MD;  Location: [facility];  Service: Urology;  Laterality: Left;   CYSTOSCOPY/URETEROSCOPY/HOLMIUM LASER/STENT PLACEMENT Left 08/17/2015   Procedure: CYSTOSCOPY / LEFT URETEROSCOPY / HOLMIUM LASER LITHOTRIPSY;  Surgeon: Bjorn Pippin, MD;  Location: [facility];  Service: Urology;  Laterality: Left;   ENDOSCOPIC MUCOSAL RESECTION N/A 05/08/2020   Procedure: ENDOSCOPIC MUCOSAL RESECTION;  Surgeon: Meridee Score Netty Starring., MD;  Location: [facility] ENDOSCOPY;  Service: Gastroenterology;  Laterality: N/A;   ENDOSCOPIC MUCOSAL RESECTION N/A 07/18/2021   Procedure: ENDOSCOPIC MUCOSAL RESECTION;  Surgeon: Meridee Score Netty Starring., MD;  Location: [facility];  Service: Gastroenterology;  Laterality: N/A;   EXTRACORPOREAL SHOCK WAVE LITHOTRIPSY  2006   HEMOSTASIS CLIP PLACEMENT  05/08/2020   Procedure: HEMOSTASIS CLIP PLACEMENT;  Surgeon: Lemar Lofty.,  MD;  Location: [facility] ENDOSCOPY;  Service: Gastroenterology;;   POLYPECTOMY  05/08/2020   Procedure: POLYPECTOMY;  Surgeon: Lemar Lofty., MD;  Location: [facility] ENDOSCOPY;  Service: Gastroenterology;;   POLYPECTOMY  07/18/2021   Procedure: POLYPECTOMY;  Surgeon: Lemar Lofty., MD;  Location: [facility];  Service: Gastroenterology;;   SEPTOPLASTY WITH ETHMOIDECTOMY, AND MAXILLARY ANTROSTOMY  09-24-2010   SINOSCOPY     STONE EXTRACTION WITH BASKET Left 08/17/2015   Procedure: STONE EXTRACTION WITH BASKET;  Surgeon: Bjorn Pippin, MD;  Location: [facility];  Service: Urology;  Laterality: Left;   SUBMUCOSAL LIFTING INJECTION  05/08/2020   Procedure: SUBMUCOSAL LIFTING INJECTION;  Surgeon: Meridee Score Netty Starring., MD;  Location: [facility] ENDOSCOPY;  Service: Gastroenterology;;   WISDOM TOOTH EXTRACTION  1994    Family History  Problem Relation Age of Onset   Asthma Father    Other Father        bronchitis   Allergic rhinitis Father    Diabetes Maternal Grandfather 21       Heavy smoker  Eczema Neg Hx    Immunodeficiency Neg Hx    Urticaria Neg Hx    Colon polyps Neg Hx    Colon cancer Neg Hx    Esophageal cancer Neg Hx    Rectal cancer Neg Hx    Stomach cancer Neg Hx    Inflammatory bowel disease Neg Hx    Liver disease Neg Hx    Pancreatic cancer Neg Hx     Social History   Tobacco Use   Smoking status: Never   Smokeless tobacco: Never  Substance Use Topics   Alcohol use: Yes    Comment: socially     Subjective:  Will be having left hip replacement in early December 2024; is now working with endocrinology to manage adrenal insufficiency and is off prednisone/ now on hydrocortisone; endocrine started on Ozempic to help with loss;  Doing well on Fasenra and Symbicort- under care of pulmonology;  Excited for the quality of life improvement that surgery will be bringing;    Objective:  Vitals:   05/20/23 1019  BP: 134/86  Pulse: 96  Resp: 18  SpO2:  96%  Weight: 272 lb (123.4 kg)  Height: 5\' 10"  (1.778 m)    General: Well developed, well nourished, in no acute distress  Skin : Warm and dry.  Head: Normocephalic and atraumatic  Eyes: Sclera and conjunctiva clear; pupils round and reactive to light; extraocular movements intact  Ears: External normal; canals clear; tympanic membranes normal  Oropharynx: Pink, supple. No suspicious lesions  Neck: Supple without thyromegaly, adenopathy  Lungs: Respirations unlabored; clear to auscultation bilaterally without wheeze, rales, rhonchi  CVS exam: normal rate and regular rhythm.  Abdomen: Soft; nontender; nondistended; normoactive bowel sounds; no masses or hepatosplenomegaly  Musculoskeletal: No deformities; no active joint inflammation  Extremities: No edema, cyanosis, clubbing  Vessels: Symmetric bilaterally  Neurologic: Alert and oriented; speech intact; face symmetrical; moves all extremities well; CNII-XII intact without focal deficit   Assessment:  1. Pre-op exam   2. Prediabetes   3. 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  Orders Placed This Encounter  Procedures   CBC with Differential/Platelet   Comp Met (CMET)   EKG 12-Lead    Requested Prescriptions    No prescriptions requested or ordered in this encounter

## 2023-05-28 ENCOUNTER — Other Ambulatory Visit: Payer: Self-pay | Admitting: Physician Assistant

## 2023-05-31 ENCOUNTER — Encounter: Payer: Self-pay | Admitting: Orthopaedic Surgery

## 2023-06-04 ENCOUNTER — Encounter: Payer: Self-pay | Admitting: Orthopedic Surgery

## 2023-06-05 NOTE — Telephone Encounter (Signed)
Do you guys know anything about this?

## 2023-06-13 ENCOUNTER — Other Ambulatory Visit: Payer: Self-pay | Admitting: Internal Medicine

## 2023-06-13 NOTE — Pre-Procedure Instructions (Signed)
Surgical Instructions   Your procedure is scheduled on June 23, 2023. Report to [facility] Main Entrance "A" at 5:30 A.M., then check in with the Admitting office. Any questions or running late day of surgery: call [phone]  Questions prior to your surgery date: call [phone], Monday-Friday, 8am-4pm. If you experience any cold or flu symptoms such as cough, fever, chills, shortness of breath, etc. between now and your scheduled surgery, please notify us at the above number.     Remember:  Do not eat after midnight the night before your surgery  You may drink clear liquids until 4:15 AM the morning of your surgery.   Clear liquids allowed are: Water, Non-Citrus Juices (without pulp), Carbonated Beverages, Clear Tea (no milk, honey, etc.), Black Coffee Only (NO MILK, CREAM OR POWDERED CREAMER of any kind), and Gatorade.  Patient Instructions  The night before surgery:  No food after midnight. ONLY clear liquids after midnight  The day of surgery (if you do NOT have diabetes):  Drink ONE (1) Pre-Surgery Clear Ensure by 4:15 AM the morning of surgery. Drink in one sitting. Do not sip.  This drink was given to you during your hospital  pre-op appointment visit.  Nothing else to drink after completing the  Pre-Surgery Clear Ensure.         If you have questions, please contact your surgeon's office.    Take these medicines the morning of surgery with A SIP OF WATER: budesonide-formoterol (SYMBICORT) inhaler  fexofenadine (ALLEGRA)  fluticasone (FLONASE) nasal spray  hydrocortisone (CORTEF)  ipratropium-albuterol (DUONEB) nebulizer  montelukast (SINGULAIR)    May take these medicines IF NEEDED: PROVENTIL HFA 108 (90 Base) inhaler - please bring inhaler with you morning of surgery traMADol (ULTRAM)  Xylometazoline HCl (4-WAY NASAL SPRAY NA)    STOP taking your Semaglutide one week prior to surgery. DO NOT take any doses after November 24th.   One week prior to  surgery, STOP taking any Aspirin (unless otherwise instructed by your surgeon) Aleve, Naproxen, Ibuprofen, Motrin, Advil, Goody's, BC's, all herbal medications, fish oil, and non-prescription vitamins. This includes your medication: meloxicam (MOBIC)                      Do NOT Smoke (Tobacco/Vaping) for 24 hours prior to your procedure.  If you use a CPAP at night, you may bring your mask/headgear for your overnight stay.   You will be asked to remove any contacts, glasses, piercing's, hearing aid's, dentures/partials prior to surgery. Please bring cases for these items if needed.    Patients discharged the day of surgery will not be allowed to drive home, and someone needs to stay with them for 24 hours.  SURGICAL WAITING ROOM VISITATION Patients may have no more than 2 support people in the waiting area - these visitors may rotate.   Pre-op nurse will coordinate an appropriate time for 1 ADULT support person, who may not rotate, to accompany patient in pre-op.  Children under the age of 17 must have an adult with them who is not the patient and must remain in the main waiting area with an adult.  If the patient needs to stay at the hospital during part of their recovery, the visitor guidelines for inpatient rooms apply.  Please refer to the [facility] website for the visitor guidelines for any additional information.   If you received a COVID test during your pre-op visit  it is requested that you wear a mask when  out in public, stay away from anyone that may not be feeling well and notify your surgeon if you develop symptoms. If you have been in contact with anyone that has tested positive in the last 10 days please notify you surgeon.      Pre-operative 5 CHG Bathing Instructions   You can play a key role in reducing the risk of infection after surgery. Your skin needs to be as free of germs as possible. You can reduce the number of germs on your skin by washing with CHG  (chlorhexidine gluconate) soap before surgery. CHG is an antiseptic soap that kills germs and continues to kill germs even after washing.   DO NOT use if you have an allergy to chlorhexidine/CHG or antibacterial soaps. If your skin becomes reddened or irritated, stop using the CHG and notify one of our RNs at [phone].   Please shower with the CHG soap starting 4 days before surgery using the following schedule:     Please keep in mind the following:  DO NOT shave, including legs and underarms, starting the day of your first shower.   You may shave your face at any point before/day of surgery.  Place clean sheets on your bed the day you start using CHG soap. Use a clean washcloth (not used since being washed) for each shower. DO NOT sleep with pets once you start using the CHG.   CHG Shower Instructions:  Wash your face and private area with normal soap. If you choose to wash your hair, wash first with your normal shampoo.  After you use shampoo/soap, rinse your hair and body thoroughly to remove shampoo/soap residue.  Turn the water OFF and apply about 3 tablespoons (45 ml) of CHG soap to a CLEAN washcloth.  Apply CHG soap ONLY FROM YOUR NECK DOWN TO YOUR TOES (washing for 3-5 minutes)  DO NOT use CHG soap on face, private areas, open wounds, or sores.  Pay special attention to the area where your surgery is being performed.  If you are having back surgery, having someone wash your back for you may be helpful. Wait 2 minutes after CHG soap is applied, then you may rinse off the CHG soap.  Pat dry with a clean towel  Put on clean clothes/pajamas   If you choose to wear lotion, please use ONLY the CHG-compatible lotions on the back of this paper.   Additional instructions for the day of surgery: DO NOT APPLY any lotions, deodorants, cologne, or perfumes.   Do not bring valuables to the hospital. [facility] is not responsible for any belongings/valuables. Do not wear nail polish,  gel polish, artificial nails, or any other type of covering on natural nails (fingers and toes) Do not wear jewelry or makeup Put on clean/comfortable clothes.  Please brush your teeth.  Ask your nurse before applying any prescription medications to the skin.     CHG Compatible Lotions   Aveeno Moisturizing lotion  Cetaphil Moisturizing Cream  Cetaphil Moisturizing Lotion  Clairol Herbal Essence Moisturizing Lotion, Dry Skin  Clairol Herbal Essence Moisturizing Lotion, Extra Dry Skin  Clairol Herbal Essence Moisturizing Lotion, Normal Skin  Curel Age Defying Therapeutic Moisturizing Lotion with Alpha Hydroxy  Curel Extreme Care Body Lotion  Curel Soothing Hands Moisturizing Hand Lotion  Curel Therapeutic Moisturizing Cream, Fragrance-Free  Curel Therapeutic Moisturizing Lotion, Fragrance-Free  Curel Therapeutic Moisturizing Lotion, Original Formula  Eucerin Daily Replenishing Lotion  Eucerin Dry Skin Therapy Plus Alpha Hydroxy Crme  Eucerin Dry Skin  Therapy Plus Alpha Hydroxy Lotion  Eucerin Original Crme  Eucerin Original Lotion  Eucerin Plus Crme Eucerin Plus Lotion  Eucerin TriLipid Replenishing Lotion  Keri Anti-Bacterial Hand Lotion  Keri Deep Conditioning Original Lotion Dry Skin Formula Softly Scented  Keri Deep Conditioning Original Lotion, Fragrance Free Sensitive Skin Formula  Keri Lotion Fast Absorbing Fragrance Free Sensitive Skin Formula  Keri Lotion Fast Absorbing Softly Scented Dry Skin Formula  Keri Original Lotion  Keri Skin Renewal Lotion Keri Silky Smooth Lotion  Keri Silky Smooth Sensitive Skin Lotion  Nivea Body Creamy Conditioning Oil  Nivea Body Extra Enriched Lotion  Nivea Body Original Lotion  Nivea Body Sheer Moisturizing Lotion Nivea Crme  Nivea Skin Firming Lotion  NutraDerm 30 Skin Lotion  NutraDerm Skin Lotion  NutraDerm Therapeutic Skin Cream  NutraDerm Therapeutic Skin Lotion  ProShield Protective Hand Cream  Provon moisturizing  lotion  Please read over the following fact sheets that you were given.

## 2023-06-16 ENCOUNTER — Other Ambulatory Visit: Payer: Self-pay | Admitting: Orthopaedic Surgery

## 2023-06-16 ENCOUNTER — Other Ambulatory Visit: Payer: Self-pay

## 2023-06-16 ENCOUNTER — Encounter (HOSPITAL_COMMUNITY)
Admission: RE | Admit: 2023-06-16 | Discharge: 2023-06-16 | Disposition: A | Discharge status: Home / Self Care | Payer: 59 | Source: Ambulatory Visit

## 2023-06-16 ENCOUNTER — Encounter (HOSPITAL_COMMUNITY): Payer: Self-pay

## 2023-06-16 VITALS — BP 131/87 | HR 78 | Temp 98.9°F | Resp 20 | Ht 70.0 in | Wt 268.7 lb

## 2023-06-16 DIAGNOSIS — Z01812 Encounter for preprocedural laboratory examination: Secondary | ICD-10-CM | POA: Diagnosis not present

## 2023-06-16 DIAGNOSIS — Z01818 Encounter for other preprocedural examination: Secondary | ICD-10-CM | POA: Diagnosis present

## 2023-06-16 DIAGNOSIS — M1612 Unilateral primary osteoarthritis, left hip: Secondary | ICD-10-CM | POA: Insufficient documentation

## 2023-06-16 HISTORY — DX: Unspecified osteoarthritis, unspecified site: M19.90

## 2023-06-16 HISTORY — DX: Prediabetes: R73.03

## 2023-06-16 LAB — BASIC METABOLIC PANEL
Anion gap: 7 (ref 5–15)
BUN: 25 mg/dL — ABNORMAL HIGH (ref 6–20)
CO2: 25 mmol/L (ref 22–32)
Calcium: 9.2 mg/dL (ref 8.9–10.3)
Chloride: 105 mmol/L (ref 98–111)
Creatinine, Ser: 1.23 mg/dL (ref 0.61–1.24)
GFR, Estimated: >60 mL/min (ref >60)
Glucose, Bld: 99 mg/dL (ref 70–99)
Potassium: 4.3 mmol/L (ref 3.5–5.1)
Sodium: 137 mmol/L (ref 135–145)

## 2023-06-16 LAB — CBC
HCT: 48.1 % (ref 39.0–52.0)
Hemoglobin: 15.1 g/dL (ref 13.0–17.0)
MCH: 28.5 pg (ref 26.0–34.0)
MCHC: 31.4 g/dL (ref 30.0–36.0)
MCV: 90.8 fL (ref 80.0–100.0)
Platelets: 245 10*3/uL (ref 150–400)
RBC: 5.3 MIL/uL (ref 4.22–5.81)
RDW: 14.1 % (ref 11.5–15.5)
WBC: 7.7 10*3/uL (ref 4.0–10.5)
nRBC: 0 % (ref 0.0–0.2)

## 2023-06-16 LAB — SURGICAL PCR SCREEN
MRSA, PCR: NEGATIVE
Staphylococcus aureus: NEGATIVE

## 2023-06-16 LAB — TYPE AND SCREEN
ABO/RH(D): O POS
Antibody Screen: NEGATIVE

## 2023-06-16 NOTE — Progress Notes (Signed)
PCP - Olive Bass, FNP Cardiologist - Denies Endocrinologist - Dr. Myrene Buddy - for Adrenal Insufficiency and chronic steroid use Pulmonologist - Dr. Jetty Duhamel  PPM/ICD - Denies Device Orders - n/a Rep Notified - n/a  Chest x-ray - 04/17/2023 EKG - 05/20/2023 Stress Test - Denies ECHO - Denies Cardiac Cath - Denies  Sleep Study - +OSA but pt unable to tolerate wearing CPAP  Pt has hx of Pre-DM  Last dose of GLP1 agonist- Last dose of Ozempic 11/14. Pt only took 6 doses (as prescribed) to assist with weight loss. No additional prescriptions given. GLP1 instructions: Instructed pt to not take any dose until after surgery, if prescribed again.  Blood Thinner Instructions: n/a Aspirin Instructions: n/a  ERAS Protcol - Clear liquids until 0415 morning of surgery PRE-SURGERY Ensure or G2- Ensure given to pt with instructions  COVID TEST- n/a   Anesthesia review: Yes. Surgical clearance from PCP. Chronic steroid use for adrenal insufficiency. Pre-DM with last A1c 5.8   Patient denies shortness of breath, fever, cough and chest pain at PAT appointment. Pt denies any respiratory illness/infection in the last two months.    All instructions explained to the patient, with a verbal understanding of the material. Patient agrees to go over the instructions while at home for a better understanding. Patient also instructed to self quarantine after being tested for COVID-19. The opportunity to ask questions was provided.

## 2023-06-18 ENCOUNTER — Telehealth: Payer: Self-pay | Admitting: Orthopaedic Surgery

## 2023-06-18 MED ORDER — TRAMADOL HCL 50 MG PO TABS: 50.0000 mg | ORAL_TABLET | Freq: Every day | ORAL | 0 refills | Status: DC | PRN

## 2023-06-18 NOTE — Telephone Encounter (Signed)
done

## 2023-06-18 NOTE — Addendum Note (Signed)
Addended by: Mayra Reel on: 06/18/2023 12:30 PM   Modules accepted: Orders

## 2023-06-18 NOTE — Telephone Encounter (Signed)
Patient requesting refill on tramadol

## 2023-06-20 ENCOUNTER — Other Ambulatory Visit: Payer: Self-pay | Admitting: Internal Medicine

## 2023-06-20 MED ORDER — TRANEXAMIC ACID 1000 MG/10ML IV SOLN
2000.0000 mg | INTRAVENOUS | Status: AC
  Filled 2023-06-20: qty 20

## 2023-06-22 DIAGNOSIS — M1612 Unilateral primary osteoarthritis, left hip: Secondary | ICD-10-CM | POA: Insufficient documentation

## 2023-06-22 NOTE — Anesthesia Preprocedure Evaluation (Signed)
Anesthesia Evaluation  Patient identified by MRN, date of birth, ID band Patient awake    Reviewed: Allergy & Precautions, NPO status , Patient's Chart, lab work & pertinent test results, reviewed documented beta blocker date and time   History of Anesthesia Complications (+) PROLONGED EMERGENCE and history of anesthetic complications  Airway Mallampati: II  TM Distance: >3 FB     Dental  (+) Teeth Intact, Caps, Dental Advisory Given   Pulmonary asthma , sleep apnea and Continuous Positive Airway Pressure Ventilation , pneumonia, resolved, COPD,  COPD inhaler Hx/o chronic bronchitis COPD /Asthma overlap   Pulmonary exam normal breath sounds clear to auscultation       Cardiovascular negative cardio ROS Normal cardiovascular exam Rhythm:Regular Rate:Normal  EKG 05/20/23 NSR, Normal   Neuro/Psych   Anxiety     negative neurological ROS     GI/Hepatic Neg liver ROS,,,Hx/o adenomatous colon polyps   Endo/Other    Class 3 obesityObesity GLP-1 RA therapy- last dose  Renal/GU Renal diseaseHx/o renal calculi with pyelonephritis and AKI  negative genitourinary   Musculoskeletal  (+) Arthritis , Osteoarthritis,  Left hip OA   Abdominal  (+) + obese  Peds  Hematology negative hematology ROS (+)   Anesthesia Other Findings   Reproductive/Obstetrics                             Anesthesia Physical Anesthesia Plan  ASA: 3  Anesthesia Plan: Spinal   Post-op Pain Management: Minimal or no pain anticipated   Induction: Intravenous  PONV Risk Score and Plan: Treatment may vary due to age or medical condition and Propofol infusion  Airway Management Planned: Natural Airway and Simple Face Mask  Additional Equipment: Spinal Drain  Intra-op Plan:   Post-operative Plan:   Informed Consent: I have reviewed the patients History and Physical, chart, labs and discussed the procedure including the  risks, benefits and alternatives for the proposed anesthesia with the patient or authorized representative who has indicated his/her understanding and acceptance.     Dental advisory given  Plan Discussed with: CRNA and Anesthesiologist  Anesthesia Plan Comments:         Anesthesia Quick Evaluation

## 2023-06-23 ENCOUNTER — Other Ambulatory Visit: Payer: Self-pay

## 2023-06-23 ENCOUNTER — Observation Stay (HOSPITAL_COMMUNITY): Payer: 59

## 2023-06-23 ENCOUNTER — Ambulatory Visit (HOSPITAL_COMMUNITY): Payer: 59 | Admitting: Physician Assistant

## 2023-06-23 ENCOUNTER — Ambulatory Visit (HOSPITAL_COMMUNITY): Payer: 59 | Admitting: Anesthesiology

## 2023-06-23 ENCOUNTER — Observation Stay (HOSPITAL_COMMUNITY)
Admission: RE | Admit: 2023-06-23 | Discharge: 2023-06-24 | Disposition: A | Discharge status: Home / Self Care | Payer: 59 | Attending: Orthopaedic Surgery | Admitting: Orthopaedic Surgery

## 2023-06-23 ENCOUNTER — Encounter (HOSPITAL_COMMUNITY)
Admission: RE | Disposition: A | Discharge status: Home / Self Care | Payer: Self-pay | Source: Home / Self Care | Attending: Orthopaedic Surgery

## 2023-06-23 ENCOUNTER — Encounter (HOSPITAL_COMMUNITY): Payer: Self-pay | Admitting: Orthopaedic Surgery

## 2023-06-23 DIAGNOSIS — Z96642 Presence of left artificial hip joint: Secondary | ICD-10-CM

## 2023-06-23 DIAGNOSIS — Z85828 Personal history of other malignant neoplasm of skin: Secondary | ICD-10-CM | POA: Insufficient documentation

## 2023-06-23 DIAGNOSIS — M1612 Unilateral primary osteoarthritis, left hip: Principal | ICD-10-CM | POA: Insufficient documentation

## 2023-06-23 DIAGNOSIS — Z79899 Other long term (current) drug therapy: Secondary | ICD-10-CM | POA: Insufficient documentation

## 2023-06-23 DIAGNOSIS — J449 Chronic obstructive pulmonary disease, unspecified: Secondary | ICD-10-CM | POA: Insufficient documentation

## 2023-06-23 HISTORY — PX: TOTAL HIP ARTHROPLASTY: SHX124

## 2023-06-23 LAB — ABO/RH: ABO/RH(D): O POS

## 2023-06-23 LAB — GLUCOSE, CAPILLARY: Glucose-Capillary: 150 mg/dL — ABNORMAL HIGH (ref 70–99)

## 2023-06-23 SURGERY — ARTHROPLASTY, HIP, TOTAL, ANTERIOR APPROACH
Anesthesia: Spinal | Site: Hip | Laterality: Left

## 2023-06-23 MED ORDER — LACTATED RINGERS IV SOLN: INTRAVENOUS | Status: DC

## 2023-06-23 MED ORDER — OXYCODONE HCL 5 MG PO TABS
5.0000 mg | ORAL_TABLET | ORAL | Status: DC | PRN
  Administered 2023-06-23: 5 mg via ORAL
  Filled 2023-06-23: qty 1

## 2023-06-23 MED ORDER — DOCUSATE SODIUM 100 MG PO CAPS
100.0000 mg | ORAL_CAPSULE | Freq: Two times a day (BID) | ORAL | Status: DC
  Administered 2023-06-23 – 2023-06-24 (×2): 100 mg via ORAL
  Filled 2023-06-23 (×2): qty 1

## 2023-06-23 MED ORDER — BUPIVACAINE HCL (PF) 0.75 % IJ SOLN
INTRAMUSCULAR | Status: DC | PRN
Start: 2023-06-23 — End: 2023-06-23
  Administered 2023-06-23: 2 mL

## 2023-06-23 MED ORDER — DOXYCYCLINE HYCLATE 100 MG PO CAPS: 100.0000 mg | ORAL_CAPSULE | Freq: Two times a day (BID) | ORAL | 0 refills | Status: DC

## 2023-06-23 MED ORDER — DEXAMETHASONE SODIUM PHOSPHATE 10 MG/ML IJ SOLN
INTRAMUSCULAR | Status: AC
Start: 2023-06-23 — End: ?
  Filled 2023-06-23: qty 1

## 2023-06-23 MED ORDER — IPRATROPIUM-ALBUTEROL 0.5-2.5 (3) MG/3ML IN SOLN
3.0000 mL | Freq: Four times a day (QID) | RESPIRATORY_TRACT | Status: DC | PRN
  Administered 2023-06-23: 3 mL via RESPIRATORY_TRACT
  Filled 2023-06-23: qty 3

## 2023-06-23 MED ORDER — PHENYLEPHRINE 80 MCG/ML (10ML) SYRINGE FOR IV PUSH (FOR BLOOD PRESSURE SUPPORT)
PREFILLED_SYRINGE | INTRAVENOUS | Status: AC
  Filled 2023-06-23: qty 10

## 2023-06-23 MED ORDER — HYDROMORPHONE HCL 1 MG/ML IJ SOLN
0.5000 mg | INTRAMUSCULAR | Status: DC | PRN
  Administered 2023-06-23: 1 mg via INTRAVENOUS
  Filled 2023-06-23: qty 1

## 2023-06-23 MED ORDER — VANCOMYCIN HCL 1000 MG IV SOLR
INTRAVENOUS | Status: AC
  Filled 2023-06-23: qty 20

## 2023-06-23 MED ORDER — FENTANYL CITRATE (PF) 250 MCG/5ML IJ SOLN
INTRAMUSCULAR | Status: AC
  Filled 2023-06-23: qty 5

## 2023-06-23 MED ORDER — OXYCODONE HCL 5 MG PO TABS: 5.0000 mg | ORAL_TABLET | Freq: Four times a day (QID) | ORAL | 0 refills | Status: DC | PRN

## 2023-06-23 MED ORDER — TRANEXAMIC ACID-NACL 1000-0.7 MG/100ML-% IV SOLN
1000.0000 mg | INTRAVENOUS | Status: AC
  Administered 2023-06-23: 1000 mg via INTRAVENOUS
  Filled 2023-06-23: qty 100

## 2023-06-23 MED ORDER — FENTANYL CITRATE (PF) 250 MCG/5ML IJ SOLN
INTRAMUSCULAR | Status: DC | PRN
  Administered 2023-06-23 (×2): 50 ug via INTRAVENOUS

## 2023-06-23 MED ORDER — PHENYLEPHRINE HCL-NACL 20-0.9 MG/250ML-% IV SOLN
INTRAVENOUS | Status: DC | PRN
  Administered 2023-06-23: 15 ug/min via INTRAVENOUS

## 2023-06-23 MED ORDER — SODIUM CHLORIDE 0.9 % IR SOLN
Status: DC | PRN
  Administered 2023-06-23: 1000 mL

## 2023-06-23 MED ORDER — MENTHOL 3 MG MT LOZG: 1.0000 | LOZENGE | OROMUCOSAL | Status: DC | PRN

## 2023-06-23 MED ORDER — VANCOMYCIN HCL 1 G IV SOLR
INTRAVENOUS | Status: DC | PRN
  Administered 2023-06-23: 1000 mg

## 2023-06-23 MED ORDER — DEXMEDETOMIDINE HCL IN NACL 80 MCG/20ML IV SOLN
INTRAVENOUS | Status: AC
  Filled 2023-06-23: qty 20

## 2023-06-23 MED ORDER — PRONTOSAN WOUND IRRIGATION OPTIME
TOPICAL | Status: DC | PRN
  Administered 2023-06-23: 1

## 2023-06-23 MED ORDER — SORBITOL 70 % SOLN: 30.0000 mL | Freq: Every day | Status: DC | PRN

## 2023-06-23 MED ORDER — TRANEXAMIC ACID-NACL 1000-0.7 MG/100ML-% IV SOLN
1000.0000 mg | Freq: Once | INTRAVENOUS | Status: AC
Start: 2023-06-23 — End: 2023-06-23
  Administered 2023-06-23: 1000 mg via INTRAVENOUS
  Filled 2023-06-23: qty 100

## 2023-06-23 MED ORDER — FERROUS SULFATE 325 (65 FE) MG PO TABS
325.0000 mg | ORAL_TABLET | Freq: Three times a day (TID) | ORAL | Status: DC
  Administered 2023-06-23 – 2023-06-24 (×3): 325 mg via ORAL
  Filled 2023-06-23 (×3): qty 1

## 2023-06-23 MED ORDER — CEFAZOLIN IN SODIUM CHLORIDE 3-0.9 GM/100ML-% IV SOLN
3.0000 g | INTRAVENOUS | Status: AC
  Administered 2023-06-23: 3 g via INTRAVENOUS
  Filled 2023-06-23: qty 100

## 2023-06-23 MED ORDER — PROPOFOL 1000 MG/100ML IV EMUL
INTRAVENOUS | Status: AC
  Filled 2023-06-23: qty 100

## 2023-06-23 MED ORDER — TRANEXAMIC ACID 1000 MG/10ML IV SOLN
INTRAVENOUS | Status: DC | PRN
  Administered 2023-06-23: 2000 mg via TOPICAL

## 2023-06-23 MED ORDER — ALUM & MAG HYDROXIDE-SIMETH 200-200-20 MG/5ML PO SUSP: 30.0000 mL | ORAL | Status: DC | PRN

## 2023-06-23 MED ORDER — OXYCODONE HCL 5 MG/5ML PO SOLN: 5.0000 mg | Freq: Once | ORAL | Status: DC | PRN

## 2023-06-23 MED ORDER — ONDANSETRON HCL 4 MG PO TABS: 4.0000 mg | ORAL_TABLET | Freq: Four times a day (QID) | ORAL | 0 refills | Status: DC | PRN

## 2023-06-23 MED ORDER — FENTANYL CITRATE (PF) 100 MCG/2ML IJ SOLN
25.0000 ug | INTRAMUSCULAR | Status: DC | PRN
Start: 2023-06-23 — End: 2023-06-23
  Administered 2023-06-23: 50 ug via INTRAVENOUS

## 2023-06-23 MED ORDER — METHOCARBAMOL 500 MG PO TABS: 750.0000 mg | ORAL_TABLET | Freq: Four times a day (QID) | ORAL | 2 refills | Status: DC | PRN

## 2023-06-23 MED ORDER — CEFAZOLIN SODIUM-DEXTROSE 2-4 GM/100ML-% IV SOLN
2.0000 g | Freq: Four times a day (QID) | INTRAVENOUS | Status: AC
  Administered 2023-06-23 (×2): 2 g via INTRAVENOUS
  Filled 2023-06-23 (×2): qty 100

## 2023-06-23 MED ORDER — MIDAZOLAM HCL 2 MG/2ML IJ SOLN
INTRAMUSCULAR | Status: AC
  Filled 2023-06-23: qty 2

## 2023-06-23 MED ORDER — DEXMEDETOMIDINE HCL IN NACL 200 MCG/50ML IV SOLN
INTRAVENOUS | Status: DC | PRN
  Administered 2023-06-23: 8 ug via INTRAVENOUS

## 2023-06-23 MED ORDER — CHLORHEXIDINE GLUCONATE 0.12 % MT SOLN
15.0000 mL | Freq: Once | OROMUCOSAL | Status: AC
  Administered 2023-06-23: 15 mL via OROMUCOSAL
  Filled 2023-06-23: qty 15

## 2023-06-23 MED ORDER — ONDANSETRON HCL 4 MG/2ML IJ SOLN
INTRAMUSCULAR | Status: AC
  Filled 2023-06-23: qty 2

## 2023-06-23 MED ORDER — MAGNESIUM CITRATE PO SOLN: 1.0000 | Freq: Once | ORAL | Status: DC | PRN

## 2023-06-23 MED ORDER — ACETAMINOPHEN 325 MG PO TABS
325.0000 mg | ORAL_TABLET | Freq: Four times a day (QID) | ORAL | Status: DC | PRN
  Administered 2023-06-24: 650 mg via ORAL
  Filled 2023-06-23: qty 2

## 2023-06-23 MED ORDER — 0.9 % SODIUM CHLORIDE (POUR BTL) OPTIME
TOPICAL | Status: DC | PRN
  Administered 2023-06-23: 1000 mL

## 2023-06-23 MED ORDER — ONDANSETRON HCL 4 MG PO TABS: 4.0000 mg | ORAL_TABLET | Freq: Four times a day (QID) | ORAL | Status: DC | PRN

## 2023-06-23 MED ORDER — DOCUSATE SODIUM 100 MG PO CAPS: 100.0000 mg | ORAL_CAPSULE | Freq: Two times a day (BID) | ORAL | 0 refills | Status: DC

## 2023-06-23 MED ORDER — ONDANSETRON HCL 4 MG/2ML IJ SOLN
4.0000 mg | Freq: Four times a day (QID) | INTRAMUSCULAR | Status: DC | PRN
  Administered 2023-06-23: 4 mg via INTRAVENOUS
  Filled 2023-06-23: qty 2

## 2023-06-23 MED ORDER — METOCLOPRAMIDE HCL 5 MG PO TABS: 5.0000 mg | ORAL_TABLET | Freq: Three times a day (TID) | ORAL | Status: DC | PRN

## 2023-06-23 MED ORDER — FENTANYL CITRATE (PF) 100 MCG/2ML IJ SOLN
INTRAMUSCULAR | Status: AC
  Administered 2023-06-23: 50 ug via INTRAVENOUS
  Filled 2023-06-23: qty 2

## 2023-06-23 MED ORDER — METHOCARBAMOL 500 MG PO TABS
500.0000 mg | ORAL_TABLET | Freq: Four times a day (QID) | ORAL | Status: DC | PRN
  Administered 2023-06-23 – 2023-06-24 (×2): 500 mg via ORAL
  Filled 2023-06-23 (×2): qty 1

## 2023-06-23 MED ORDER — ONDANSETRON HCL 4 MG/2ML IJ SOLN: 4.0000 mg | Freq: Once | INTRAMUSCULAR | Status: DC | PRN

## 2023-06-23 MED ORDER — BUPIVACAINE-MELOXICAM ER 400-12 MG/14ML IJ SOLN
INTRAMUSCULAR | Status: AC
  Filled 2023-06-23: qty 1

## 2023-06-23 MED ORDER — OXYCODONE HCL 5 MG PO TABS: 5.0000 mg | ORAL_TABLET | Freq: Once | ORAL | Status: DC | PRN

## 2023-06-23 MED ORDER — PANTOPRAZOLE SODIUM 40 MG PO TBEC
40.0000 mg | DELAYED_RELEASE_TABLET | Freq: Every day | ORAL | Status: DC
  Administered 2023-06-23 – 2023-06-24 (×2): 40 mg via ORAL
  Filled 2023-06-23 (×2): qty 1

## 2023-06-23 MED ORDER — SODIUM CHLORIDE 0.9 % IV SOLN: Freq: Once | INTRAVENOUS | Status: AC

## 2023-06-23 MED ORDER — METHOCARBAMOL 1000 MG/10ML IJ SOLN: 500.0000 mg | Freq: Four times a day (QID) | INTRAMUSCULAR | Status: DC | PRN

## 2023-06-23 MED ORDER — ORAL CARE MOUTH RINSE: 15.0000 mL | Freq: Once | OROMUCOSAL | Status: AC

## 2023-06-23 MED ORDER — PROPOFOL 500 MG/50ML IV EMUL
INTRAVENOUS | Status: DC | PRN
  Administered 2023-06-23: 100 ug/kg/min via INTRAVENOUS

## 2023-06-23 MED ORDER — OXYCODONE HCL ER 10 MG PO T12A
10.0000 mg | EXTENDED_RELEASE_TABLET | Freq: Two times a day (BID) | ORAL | Status: DC
  Administered 2023-06-23 – 2023-06-24 (×3): 10 mg via ORAL
  Filled 2023-06-23 (×3): qty 1

## 2023-06-23 MED ORDER — ACETAMINOPHEN 500 MG PO TABS
1000.0000 mg | ORAL_TABLET | Freq: Four times a day (QID) | ORAL | Status: AC
  Administered 2023-06-23 – 2023-06-24 (×3): 1000 mg via ORAL
  Filled 2023-06-23 (×3): qty 2

## 2023-06-23 MED ORDER — PROPOFOL 10 MG/ML IV BOLUS
INTRAVENOUS | Status: DC | PRN
  Administered 2023-06-23: 20 mg via INTRAVENOUS
  Administered 2023-06-23 (×2): 30 mg via INTRAVENOUS

## 2023-06-23 MED ORDER — PHENOL 1.4 % MT LIQD: 1.0000 | OROMUCOSAL | Status: DC | PRN

## 2023-06-23 MED ORDER — DEXAMETHASONE SODIUM PHOSPHATE 10 MG/ML IJ SOLN
INTRAMUSCULAR | Status: DC | PRN
  Administered 2023-06-23: 10 mg via INTRAVENOUS

## 2023-06-23 MED ORDER — PROPOFOL 10 MG/ML IV BOLUS
INTRAVENOUS | Status: AC
  Filled 2023-06-23: qty 20

## 2023-06-23 MED ORDER — ASPIRIN 81 MG PO CHEW
81.0000 mg | CHEWABLE_TABLET | Freq: Two times a day (BID) | ORAL | Status: DC
  Administered 2023-06-23 – 2023-06-24 (×2): 81 mg via ORAL
  Filled 2023-06-23 (×2): qty 1

## 2023-06-23 MED ORDER — OXYCODONE HCL 5 MG PO TABS
10.0000 mg | ORAL_TABLET | ORAL | Status: DC | PRN
  Administered 2023-06-23: 15 mg via ORAL
  Filled 2023-06-23: qty 3

## 2023-06-23 MED ORDER — POLYETHYLENE GLYCOL 3350 17 G PO PACK
17.0000 g | PACK | Freq: Every day | ORAL | Status: DC
  Administered 2023-06-24: 17 g via ORAL
  Filled 2023-06-23 (×2): qty 1

## 2023-06-23 MED ORDER — ONDANSETRON HCL 4 MG/2ML IJ SOLN
INTRAMUSCULAR | Status: DC | PRN
  Administered 2023-06-23: 4 mg via INTRAVENOUS

## 2023-06-23 MED ORDER — DEXAMETHASONE SODIUM PHOSPHATE 10 MG/ML IJ SOLN
10.0000 mg | Freq: Once | INTRAMUSCULAR | Status: AC
Start: 2023-06-24 — End: 2023-06-24
  Administered 2023-06-24: 10 mg via INTRAVENOUS
  Filled 2023-06-23: qty 1

## 2023-06-23 MED ORDER — MIDAZOLAM HCL 2 MG/2ML IJ SOLN
INTRAMUSCULAR | Status: DC | PRN
  Administered 2023-06-23 (×4): 1 mg via INTRAVENOUS

## 2023-06-23 MED ORDER — METOCLOPRAMIDE HCL 5 MG/ML IJ SOLN: 5.0000 mg | Freq: Three times a day (TID) | INTRAMUSCULAR | Status: DC | PRN

## 2023-06-23 MED ORDER — ASPIRIN 81 MG PO CHEW: 81.0000 mg | CHEWABLE_TABLET | Freq: Two times a day (BID) | ORAL | 0 refills | Status: DC

## 2023-06-23 MED ORDER — POVIDONE-IODINE 10 % EX SWAB: 2.0000 | Freq: Once | CUTANEOUS | Status: DC

## 2023-06-23 MED ORDER — BUPIVACAINE-MELOXICAM ER 400-12 MG/14ML IJ SOLN
INTRAMUSCULAR | Status: DC | PRN
  Administered 2023-06-23: 400 mg

## 2023-06-23 MED ORDER — DIPHENHYDRAMINE HCL 12.5 MG/5ML PO ELIX: 25.0000 mg | ORAL_SOLUTION | ORAL | Status: DC | PRN

## 2023-06-23 SURGICAL SUPPLY — 60 items
BAG DECANTER FOR FLEXI CONT (MISCELLANEOUS) ×1 IMPLANT
BLADE SAG 18X100X1.27 (BLADE) ×1 IMPLANT
CANISTER WOUNDNEG PRESSURE 500 (CANNISTER) IMPLANT
COOLER ICEMAN CLASSIC (MISCELLANEOUS) IMPLANT
COVER PERINEAL POST (MISCELLANEOUS) ×1 IMPLANT
COVER SURGICAL LIGHT HANDLE (MISCELLANEOUS) ×1 IMPLANT
CUP ACET PNNCL SECTR W/GRIP 56 (Hips) IMPLANT
DRAPE C-ARM 42X72 X-RAY (DRAPES) ×1 IMPLANT
DRAPE POUCH INSTRU U-SHP 10X18 (DRAPES) ×1 IMPLANT
DRAPE STERI IOBAN 125X83 (DRAPES) ×1 IMPLANT
DRAPE U-SHAPE 47X51 STRL (DRAPES) ×2 IMPLANT
DRESSING PEEL AND PLAC PRVNA20 (GAUZE/BANDAGES/DRESSINGS) IMPLANT
DRSG AQUACEL AG ADV 3.5X10 (GAUZE/BANDAGES/DRESSINGS) ×1 IMPLANT
DRSG PEEL AND PLACE PREVENA 20 (GAUZE/BANDAGES/DRESSINGS) ×1
DURAPREP 26ML APPLICATOR (WOUND CARE) ×2 IMPLANT
ELECT BLADE 4.0 EZ CLEAN MEGAD (MISCELLANEOUS) ×1
ELECT REM PT RETURN 9FT ADLT (ELECTROSURGICAL) ×1
ELECTRODE BLDE 4.0 EZ CLN MEGD (MISCELLANEOUS) ×1 IMPLANT
ELECTRODE REM PT RTRN 9FT ADLT (ELECTROSURGICAL) ×1 IMPLANT
GLOVE BIOGEL PI IND STRL 7.0 (GLOVE) ×2 IMPLANT
GLOVE BIOGEL PI IND STRL 7.5 (GLOVE) ×5 IMPLANT
GLOVE ECLIPSE 7.0 STRL STRAW (GLOVE) ×2 IMPLANT
GLOVE SKINSENSE STRL SZ7.5 (GLOVE) ×1 IMPLANT
GLOVE SURG SYN 7.5 E (GLOVE) ×2 IMPLANT
GLOVE SURG SYN 7.5 PF PI (GLOVE) ×2 IMPLANT
GLOVE SURG UNDER POLY LF SZ7 (GLOVE) ×3 IMPLANT
GLOVE SURG UNDER POLY LF SZ7.5 (GLOVE) ×2 IMPLANT
GOWN STRL REUS W/ TWL LRG LVL3 (GOWN DISPOSABLE) IMPLANT
GOWN STRL REUS W/ TWL XL LVL3 (GOWN DISPOSABLE) ×1 IMPLANT
GOWN STRL SURGICAL XL XLNG (GOWN DISPOSABLE) ×1 IMPLANT
GOWN TOGA ZIPPER T7+ PEEL AWAY (MISCELLANEOUS) ×2 IMPLANT
HEAD FEM CER ARTIC +5 40 12/14 (Head) IMPLANT
HOOD PEEL AWAY T7 (MISCELLANEOUS) ×1 IMPLANT
IV NS IRRIG 3000ML ARTHROMATIC (IV SOLUTION) ×1 IMPLANT
KIT BASIN OR (CUSTOM PROCEDURE TRAY) ×1 IMPLANT
KIT DRSG PREVENA PLUS 7DAY 125 (MISCELLANEOUS) IMPLANT
LINER NEUT HIP ALTRX 40 56 +4 (Liner) ×1 IMPLANT
LINER NEUTRAL HIP ALTRX 40 56 (Liner) IMPLANT
MARKER SKIN DUAL TIP RULER LAB (MISCELLANEOUS) ×1 IMPLANT
NDL SPNL 18GX3.5 QUINCKE PK (NEEDLE) ×1 IMPLANT
NEEDLE SPNL 18GX3.5 QUINCKE PK (NEEDLE) ×1 IMPLANT
PACK TOTAL JOINT (CUSTOM PROCEDURE TRAY) ×1 IMPLANT
PACK UNIVERSAL I (CUSTOM PROCEDURE TRAY) ×1 IMPLANT
PAD COLD SHLDR WRAP-ON (PAD) IMPLANT
PINN SECTOR W/GRIP ACE CUP 56 (Hips) ×1 IMPLANT
SET HNDPC FAN SPRY TIP SCT (DISPOSABLE) ×1 IMPLANT
SOLUTION PRONTOSAN WOUND 350ML (IRRIGATION / IRRIGATOR) ×1 IMPLANT
STAPLER VISISTAT 35W (STAPLE) IMPLANT
STEM FEMORAL SZ6 HIGH ACTIS (Stem) IMPLANT
SUT ETHIBOND 2 V 37 (SUTURE) ×1 IMPLANT
SUT ETHILON 2 0 FS 18 (SUTURE) IMPLANT
SUT VIC AB 0 CT1 27XBRD ANBCTR (SUTURE) ×1 IMPLANT
SUT VIC AB 1 CTX36XBRD ANBCTR (SUTURE) ×1 IMPLANT
SUT VIC AB 2-0 CT1 (SUTURE) IMPLANT
SUT VIC AB 2-0 CT1 TAPERPNT 27 (SUTURE) ×2 IMPLANT
SYR 50ML LL SCALE MARK (SYRINGE) ×1 IMPLANT
TOWEL GREEN STERILE (TOWEL DISPOSABLE) ×1 IMPLANT
TRAY FOLEY W/BAG SLVR 16FR ST (SET/KITS/TRAYS/PACK) IMPLANT
TUBE SUCT ARGYLE STRL (TUBING) ×1 IMPLANT
YANKAUER SUCT BULB TIP NO VENT (SUCTIONS) ×1 IMPLANT

## 2023-06-23 NOTE — TOC Initial Note (Signed)
Transition of Care [facility]) - Initial/Assessment Note    Patient Details  Name: [name] [name] MRN: [id number] Date of Birth: [birth year]  Transition of Care [facility]) CM/SW Contact:    Ronny Bacon, RN Phone Number: 06/23/2023, 12:10 PM  Clinical Narrative:   Patient had surgery today for Osteoarthritis of left hip. Orders for [facility] PT and DME equipment noted. BSC/3:1 and RW ordered through [facility] with [employer] to be delivered close to discharge date. Spoke with Angie ([facility]) to see if able to accept pt for [facility] PT with current insurance coverage. Awaiting response.               Expected Discharge Plan: Home w Home Health Services (If able to find one in network with insurance coverage) Barriers to Discharge: Continued Medical Work up   Patient Goals and CMS Choice            Expected Discharge Plan and Services       Living arrangements for the past 2 months: Single Family Home                 DME Arranged: 3-N-1, Walker rolling DME Agency: [employer] Date DME Agency Contacted: 06/23/23 Time DME Agency Contacted: 1204 Representative spoke with at DME Agency: Vaughan Basta HH Arranged: PT HH Agency:  (Message sent to Angie with [facility], awaiting response) Date HH Agency Contacted: 06/23/23 Time HH Agency Contacted: 1203 (Awaiting response) Representative spoke with at [facility] Agency: Angie  Prior Living Arrangements/Services Living arrangements for the past 2 months: Single Family Home Lives with:: Spouse Patient language and need for interpreter reviewed:: Yes Do you feel safe going back to the place where you live?: Yes      Need for Family Participation in Patient Care: Yes (Comment) Care giver support system in place?: Yes (comment)   Criminal Activity/Legal Involvement Pertinent to Current Situation/Hospitalization: No - Comment as needed  Activities of Daily Living   ADL Screening (condition at time of admission) Independently performs  ADLs?: Yes (appropriate for developmental age) Is the patient deaf or have difficulty hearing?: No Does the patient have difficulty seeing, even when wearing glasses/contacts?: No Does the patient have difficulty concentrating, remembering, or making decisions?: No  Permission Sought/Granted                  Emotional Assessment         Alcohol / Substance Use: Not Applicable Psych Involvement: No (comment)  Admission diagnosis:  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] Patient Active Problem List   Diagnosis Date Noted   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   Primary osteoarthritis of left hip 06/22/2023   Prediabetes 04/25/2023   Arthralgia 01/17/2023   Secondary adrenal insufficiency (HCC) 01/17/2023   Adrenal insufficiency due to corticosteroid withdrawal (HCC) 04/22/2022   Sialadenitis 06/11/2021   Hx of adenomatous colonic polyps 04/06/2021   History of colon polyps 03/01/2020   Abnormal colonoscopy 03/01/2020   Recurrent urticaria/pruritus 09/21/2019   History of nasal polyposis 09/21/2019   Contact dermatitis 07/01/2019   Acute kidney injury (HCC) 08/08/2015   Hyperkalemia 08/08/2015   Acute pyelonephritis    Left ureteral stone 08/07/2015   Pyonephrosis 08/07/2015   Acute renal insufficiency 08/07/2015   Nonallopathic lesion of thoracic region 03/15/2015   Lateral epicondylitis of right elbow 02/22/2015   Obstructive sleep apnea 01/28/2015   Triceps tendinitis 01/11/2015   Back pain 12/21/2014   Anxiety state 02/06/2014   Thrush of mouth  and esophagus (HCC) 06/20/2011   Sinusitis, maxillary, chronic 11/18/2010   ALLERGIC CONJUNCTIVITIS 08/25/2007   Perennial allergic rhinitis 08/25/2007   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) 08/25/2007   PCP:  Olive Bass, FNP Pharmacy:   [facility] Delivery - [place], [place] - [phone] [address] [place]  [address] Phone: [phone] Fax: [phone]  [id number] - [place], [place] - [address] AT [facility] [address] [place] [address] Phone: [phone] Fax: [phone]     Social Determinants of Health (SDOH) Social History: SDOH Screenings   Food Insecurity: No Food Insecurity (06/23/2023)  Housing: Low Risk  (06/23/2023)  Transportation Needs: No Transportation Needs (06/23/2023)  Utilities: Not At Risk (06/23/2023)  Depression (PHQ2-9): Low Risk  (05/20/2023)  Financial Resource Strain: Low Risk  (05/13/2023)  Physical Activity: Insufficiently Active (05/13/2023)  Social Connections: Socially Integrated (05/13/2023)  Stress: No Stress Concern Present (05/13/2023)  Tobacco Use: Low Risk  (06/23/2023)   SDOH Interventions:     Readmission Risk Interventions     No data to display

## 2023-06-23 NOTE — TOC Progression Note (Signed)
Transition of Care [facility]) - Progression Note    Patient Details  Name: [name] [name] MRN: [id number] Date of Birth: [birth year]  Transition of [facility]) CM/SW Contact  Kermit Balo, RN Phone Number: 06/23/2023, 3:39 PM  Clinical Narrative:     [name] has accepted for home health services. Information on the AVS.  Expected Discharge Plan: Home w Home Health Services (If able to find one in network with insurance coverage) Barriers to Discharge: Continued Medical Work up  Expected Discharge Plan and Services       Living arrangements for the past 2 months: Single Family Home                 DME Arranged: 3-N-1, Walker rolling DME Agency: [employer] Date DME Agency Contacted: 06/23/23 Time DME Agency Contacted: 1204 Representative spoke with at DME Agency: Vaughan Basta HH Arranged: PT HH Agency:  (Message sent to Angie with [facility], awaiting response) Date HH Agency Contacted: 06/23/23 Time HH Agency Contacted: 1203 (Awaiting response) Representative spoke with at [facility] Agency: Angie   Social Determinants of Health (SDOH) Interventions SDOH Screenings   Food Insecurity: No Food Insecurity (06/23/2023)  Housing: Low Risk  (06/23/2023)  Transportation Needs: No Transportation Needs (06/23/2023)  Utilities: Not At Risk (06/23/2023)  Depression (PHQ2-9): Low Risk  (05/20/2023)  Financial Resource Strain: Low Risk  (05/13/2023)  Physical Activity: Insufficiently Active (05/13/2023)  Social Connections: Socially Integrated (05/13/2023)  Stress: No Stress Concern Present (05/13/2023)  Tobacco Use: Low Risk  (06/23/2023)    Readmission Risk Interventions     No data to display

## 2023-06-23 NOTE — H&P (Signed)
PREOPERATIVE H&P  Chief Complaint: left hip osteoarthritis  HPI: [name] [name] is a 57 y.o. male who presents for surgical treatment of left hip osteoarthritis.  He denies any changes in medical history.  Past Surgical History:  Procedure Laterality Date   BIOPSY  07/18/2021   Procedure: BIOPSY;  Surgeon: Meridee Score Netty Starring., MD;  Location: [facility];  Service: Gastroenterology;;   COLONOSCOPY  12/07/2019   polyps   COLONOSCOPY WITH PROPOFOL N/A 05/08/2020   Procedure: COLONOSCOPY WITH PROPOFOL;  Surgeon: Lemar Lofty., MD;  Location: [facility] ENDOSCOPY;  Service: Gastroenterology;  Laterality: N/A;   COLONOSCOPY WITH PROPOFOL N/A 07/18/2021   Procedure: COLONOSCOPY WITH PROPOFOL;  Surgeon: Meridee Score Netty Starring., MD;  Location: [facility]OSCOPY;  Service: Gastroenterology;  Laterality: N/A;   CYSTO/  LEFT URETERAL STENT PLACEMENT  03/10/2002   CYSTOSCOPY W/ URETERAL STENT PLACEMENT Left 08/07/2015   Procedure: CYSTOSCOPY, Left Retrograde Pyelogram, Left Ureteral Stent Placement;  Surgeon: Bjorn Pippin, MD;  Location: [facility];  Service: Urology;  Laterality: Left;   CYSTOSCOPY/URETEROSCOPY/HOLMIUM LASER/STENT PLACEMENT Left 08/17/2015   Procedure: CYSTOSCOPY / LEFT URETEROSCOPY / HOLMIUM LASER LITHOTRIPSY;  Surgeon: Bjorn Pippin, MD;  Location: [facility];  Service: Urology;  Laterality: Left;   ENDOSCOPIC MUCOSAL RESECTION N/A 05/08/2020   Procedure: ENDOSCOPIC MUCOSAL RESECTION;  Surgeon: Meridee Score Netty Starring., MD;  Location: [facility] ENDOSCOPY;  Service: Gastroenterology;  Laterality: N/A;   ENDOSCOPIC MUCOSAL RESECTION N/A 07/18/2021   Procedure: ENDOSCOPIC MUCOSAL RESECTION;  Surgeon: Meridee Score Netty Starring., MD;  Location: [facility];  Service: Gastroenterology;  Laterality: N/A;   EXTRACORPOREAL SHOCK WAVE LITHOTRIPSY  2006   HEMOSTASIS CLIP PLACEMENT  05/08/2020   Procedure: HEMOSTASIS CLIP PLACEMENT;  Surgeon: Lemar Lofty., MD;  Location: [facility]  ENDOSCOPY;  Service: Gastroenterology;;   POLYPECTOMY  05/08/2020   Procedure: POLYPECTOMY;  Surgeon: Lemar Lofty., MD;  Location: [facility];  Service: Gastroenterology;;   POLYPECTOMY  07/18/2021   Procedure: POLYPECTOMY;  Surgeon: Lemar Lofty., MD;  Location: [facility];  Service: Gastroenterology;;   SEPTOPLASTY WITH ETHMOIDECTOMY, AND MAXILLARY ANTROSTOMY  09/24/2010   SINOSCOPY     STONE EXTRACTION WITH BASKET Left 08/17/2015   Procedure: STONE EXTRACTION WITH BASKET;  Surgeon: Bjorn Pippin, MD;  Location: [facility];  Service: Urology;  Laterality: Left;   SUBMUCOSAL LIFTING INJECTION  05/08/2020   Procedure: SUBMUCOSAL LIFTING INJECTION;  Surgeon: Meridee Score Netty Starring., MD;  Location: [facility] ENDOSCOPY;  Service: Gastroenterology;;   WISDOM TOOTH EXTRACTION  1984   Social History   Socioeconomic History   Marital status: Married    Spouse name: Not on file   Number of children: Not on file   Years of education: Not on file   Highest education level: Associate degree: occupational, Scientist, product/process development, or vocational program  Occupational History   Not on file  Tobacco Use   Smoking status: Never   Smokeless tobacco: Never  Vaping Use   Vaping status: Never Used  Substance and Sexual Activity   Alcohol use: Not Currently   Drug use: Never   Sexual activity: Yes  Other Topics Concern   Not on file  Social History Narrative   Not on file   Social Determinants of Health   Financial Resource Strain: Low Risk  (05/13/2023)   Overall Financial Resource Strain (CARDIA)    Difficulty of Paying Living Expenses: Not very hard  Food Insecurity: No Food Insecurity (05/13/2023)   Hunger Vital Sign    Worried About Running Out  of Food in the Last Year: Never true    Ran Out of Food in the Last Year: Never true  Transportation Needs: No Transportation Needs (05/13/2023)   PRAPARE - Administrator, Civil Service (Medical): No    Lack of  Transportation (Non-Medical): No  Physical Activity: Insufficiently Active (05/13/2023)   Exercise Vital Sign    Days of Exercise per Week: 1 day    Minutes of Exercise per Session: 10 min  Stress: No Stress Concern Present (05/13/2023)   [employer] of Occupational Health - Occupational Stress Questionnaire    Feeling of Stress : Only a little  Social Connections: Socially Integrated (05/13/2023)   Social Connection and Isolation Panel [NHANES]    Frequency of Communication with Friends and Family: More than three times a week    Frequency of Social Gatherings with Friends and Family: Once a week    Attends Religious Services: More than 4 times per year    Active Member of [employer] or Organizations: Yes    Attends Engineer, structural: More than 4 times per year    Marital Status: Married   Family History  Problem Relation Age of Onset   Asthma Father    Other Father        bronchitis   Allergic rhinitis Father    Diabetes Maternal Grandfather 60       Heavy smoker   Eczema Neg Hx    Immunodeficiency Neg Hx    Urticaria Neg Hx    Colon polyps Neg Hx    Colon cancer Neg Hx    Esophageal cancer Neg Hx    Rectal cancer Neg Hx    Stomach cancer Neg Hx    Inflammatory bowel disease Neg Hx    Liver disease Neg Hx    Pancreatic cancer Neg Hx    No Known Allergies Prior to Admission medications   Medication Sig Start Date End Date Taking? Authorizing Provider  benralizumab [facility] PEN) 30 MG/ML prefilled autoinjector INJECT 30MG  SUBCUTANEOUSLY EVERY 8 WEEKS 03/03/23  Yes Young, Clinton D, MD  bisacodyl (DULCOLAX) 5 MG EC tablet Take 5 mg by mouth daily as needed for moderate constipation.   Yes [provider]  Black Pepper-Turmeric (TURMERIC COMPLEX/BLACK PEPPER PO) Take 1 tablet by mouth daily.   Yes [provider]  budesonide-formoterol (SYMBICORT) 160-4.5 MCG/ACT inhaler USE 2 INHALATIONS BY MOUTH TWICE DAILY RINSE MOUTH AFTER USE 03/28/23  Yes  Young, Joni Fears D, MD  cholecalciferol (VITAMIN D3) 25 MCG (1000 UNIT) tablet Take 1,000 Units by mouth daily.   Yes [provider]  clorazepate (TRANXENE) 7.5 MG tablet TAKE 1 TO 2 TABLETS BY MOUTH AT  NIGHT AS NEEDED FOR SLEEP 03/07/23  Yes Young, Clinton D, MD  fexofenadine (ALLEGRA) 180 MG tablet Take 180 mg by mouth daily.   Yes [provider]  fluticasone (FLONASE) 50 MCG/ACT nasal spray USE 2 SPRAYS IN BOTH NOSTRILS  DAILY 07/23/22  Yes Young, Joni Fears D, MD  hydrocortisone (CORTEF) 5 MG tablet Take 3 tablets (15 mg total) by mouth daily with breakfast AND 1 tablet (5 mg total) daily with supper. 01/17/23  Yes Shamleffer, Konrad Dolores, MD  ipratropium-albuterol (DUONEB) 0.5-2.5 (3) MG/3ML SOLN USE 1 VIAL VIA NEBULIZER 4 TIMES DAILY AS NEEDED 06/20/23  Yes Olalere, Adewale A, MD  meloxicam (MOBIC) 7.5 MG tablet Take 7.5 mg by mouth 2 (two) times daily. 05/18/23  Yes [provider]  montelukast (SINGULAIR) 10 MG tablet TAKE 1  TABLET BY MOUTH DAILY 05/20/22  Yes Young, Joni Fears D, MD  Multiple Vitamin (MULTIVITAMIN WITH MINERALS) TABS tablet Take 1 tablet by mouth daily.   Yes [provider]  PROVENTIL HFA 108 (90 Base) MCG/ACT inhaler Inhale 1-2 puffs into the lungs every 6 (six) hours as needed for wheezing or shortness of breath. 03/04/22  Yes Young, Joni Fears D, MD  sildenafil (VIAGRA) 100 MG tablet Take 0.5-1 tablets (50-100 mg total) by mouth daily as needed for erectile dysfunction. 05/16/22  Yes Olive Bass, FNP  traMADol (ULTRAM) 50 MG tablet Take 1-2 tablets (50-100 mg total) by mouth daily as needed. 06/18/23  Yes Tarry Kos, MD  Xylometazoline HCl (4-WAY NASAL SPRAY NA) Place 1-2 sprays into the nose daily as needed (congestion).   Yes [provider]  Nebulizers (COMPRESSOR NEBULIZER) MISC 1 Device by Does not apply route 4 (four) times daily as needed. 10/02/16   Waymon Budge, MD  Semaglutide,0.25 or 0.5MG /DOS, 2 MG/3ML SOPN  Inject 0.5 mg into the skin once a week. Patient taking differently: Inject 0.25 mg into the skin once a week. 04/25/23   Shamleffer, Konrad Dolores, MD  Spacer/[name]-Holding Chambers (AEROCHAMBER MV) inhaler Use as instructed 05/28/18   Jetty Duhamel D, MD     Positive ROS: All other systems have been reviewed and were otherwise negative with the exception of those mentioned in the HPI and as above.  Physical Exam: General: Alert, no acute distress Cardiovascular: No pedal edema Respiratory: No cyanosis, no use of accessory musculature GI: abdomen soft Skin: No lesions in the area of chief complaint Neurologic: Sensation intact distally Psychiatric: Patient is competent for consent with normal mood and affect Lymphatic: no lymphedema  MUSCULOSKELETAL: exam stable  Assessment: left hip osteoarthritis  Plan: Plan for Procedure(s): LEFT TOTAL HIP ARTHROPLASTY ANTERIOR APPROACH  The risks benefits and alternatives were discussed with the patient including but not limited to the risks of nonoperative treatment, versus surgical intervention including infection, bleeding, nerve injury,  blood clots, cardiopulmonary complications, morbidity, mortality, among others, and they were willing to proceed.   Glee Arvin, MD 06/23/2023 6:44 AM

## 2023-06-23 NOTE — Transfer of Care (Signed)
Immediate Anesthesia Transfer of Care Note  Patient: [name] [name]  Procedure(s) Performed: LEFT TOTAL HIP ARTHROPLASTY ANTERIOR APPROACH (Left: Hip)  Patient Location: PACU  Anesthesia Type:Spinal  Level of Consciousness: awake, alert , oriented, and patient cooperative  Airway & Oxygen Therapy: Patient Spontanous Breathing  Post-op Assessment: Report given to RN and Post -op Vital signs reviewed and stable  Post vital signs: Reviewed and stable  Last Vitals:  Vitals Value Taken Time  BP 135/75 06/23/23 0933  Temp    Pulse 84 06/23/23 0937  Resp 11 06/23/23 0937  SpO2 93 % 06/23/23 0937  Vitals shown include unfiled device data.  Last Pain:  Vitals:   06/23/23 3474  PainSc: 0-No pain      Patients Stated Pain Goal: 0 (06/23/23 2595)  Complications: No notable events documented.

## 2023-06-23 NOTE — Anesthesia Procedure Notes (Signed)
Date/Time: 06/23/2023 7:32 AM  Performed by: Ammie Dalton, CRNAOxygen Delivery Method: Simple face mask

## 2023-06-23 NOTE — Op Note (Addendum)
LEFT TOTAL HIP ARTHROPLASTY ANTERIOR APPROACH  Procedure Note [name] [name]   [id number]  Pre-op Diagnosis: left hip osteoarthritis     Post-op Diagnosis: same  Operative Findings Complete loss of articular cartilage and joint space   Operative Procedures  1. Total hip replacement; Left hip; uncemented [id number]  2. Application of incisional VAC left hip cpt [id number]  Surgeon: Gershon Mussel, M.D.  Assist: Oneal Grout, PA-C   Anesthesia: spinal  Prosthesis: Depuy Acetabulum: Pinnacle 56 mm Femur: Actis 6 HO Head: 40 mm size: +5 Liner: +4 Bearing Type: ceramic/poly  Total Hip Arthroplasty (Anterior Approach) Op Note:  After informed consent was obtained and the operative extremity marked in the holding area, the patient was brought back to the operating room and placed supine on the HANA table. Next, the operative extremity was prepped and draped in normal sterile fashion. Surgical timeout occurred verifying patient identification, surgical site, surgical procedure and administration of antibiotics.  A 10 cm longitudinal incision was made starting from 2 fingerbreadths lateral and inferior to the ASIS towards the lateral aspect of the patella.  A Hueter approach to the hip was performed, using the interval between tensor fascia lata and sartorius.  Dissection was carried bluntly down onto the anterior hip capsule. The lateral femoral circumflex vessels were identified and coagulated. A capsulotomy was performed and the capsular flaps tagged for later repair.  The neck osteotomy was performed. The femoral head was removed which showed severe wear, the acetabular rim was cleared of soft tissue and osteophytes and attention was turned to reaming the acetabulum.  Sequential reaming was performed under fluoroscopic guidance down to the floor of the cotyloid fossa. We reamed to a size 55 mm, and then impacted the acetabular shell.  The liner was then placed after irrigation and  attention turned to the femur.  After placing the femoral hook, the leg was taken to externally rotated, extended and adducted position taking care to perform soft tissue releases to allow for adequate mobilization of the femur. Soft tissue was cleared from the shoulder of the greater trochanter and the hook elevator used to improve exposure of the proximal femur. Sequential broaching performed up to a size 6. Trial neck and head were placed. The leg was brought back up to neutral and the construct reduced.  The position and sizing of components, offset and leg lengths were checked using fluoroscopy. Stability of the construct was checked in 45 degrees of hip extension and 90 degrees of external rotation without any subluxation, shuck or impingement of prosthesis. His leg was a tad short with a +1.5 head ball so we decided to use +5 head ball especially since his right hip was also arthritic and basically bone on bone.  We dislocated the prosthesis, dropped the leg back into position, removed trial components, and irrigated copiously. The final stem and head was then placed, the leg brought back up, the system reduced and fluoroscopy used to verify positioning.  Antibiotic irrigation was placed in the surgical wound.   We irrigated, obtained hemostasis and closed the capsule using #2 ethibond suture.  A topical mixture of 0.25% bupivacaine and meloxicam was placed deep to the fascia.  One gram of vancomycin powder was placed in the surgical bed.   One gram of topical tranexamic acid was injected into the joint.  The fascia was closed with #1 vicryl plus, the deep fat layer was closed with 0 vicryl, the subcutaneous layers closed with 2.0 Vicryl Plus and the  skin closed with 2.0 nylon and incisional VAC to protect the incision due to large overhanging abdominal pannus. A sterile dressing was applied. The patient was awakened in the operating room and taken to recovery in stable condition.  All sponge, needle, and  instrument counts were correct at the end of the case.   Tessa Lerner, my PA, was a medical necessity for opening, closing, limb positioning, retracting, exposing, and overall facilitation and timely completion of the surgery.  Position: supine  Complications: see description of procedure.  Time Out: performed   Drains/Packing: none  Estimated blood loss: see anesthesia record  Returned to Recovery Room: in good condition.   Antibiotics: yes   Mechanical VTE (DVT) Prophylaxis: sequential compression devices, TED thigh-high  Chemical VTE (DVT) Prophylaxis: aspirin 81 mg BID x 6 weeks   Fluid Replacement: see anesthesia record  Specimens Removed: 1 to pathology   Sponge and Instrument Count Correct? yes   PACU: portable radiograph - low AP   Plan/RTC: Return in 2 weeks for staple removal. Weight Bearing/Load Lower Extremity: full  Hip precautions: none Suture Removal: 2 weeks   N. Glee Arvin, MD [facility] 8:51 AM   Implant Name Type Inv. Item Serial No. Manufacturer Lot No. LRB No. Used Action  PINN SECTOR W/GRIP ACE CUP 56 - [id number] Hips PINN SECTOR W/GRIP ACE CUP 56  [employer] [id number] Left 1 Implanted  LINER NEUT HIP ALTRX 40 56 +4 - [id number] Liner LINER NEUT HIP ALTRX 40 56 +4  [employer]PUY [employer]TH[employer] [id number] Left 1 Implanted  STEM FEMORAL SZ6 HIGH ACTIS - [id number] Stem STEM FEMORAL SZ6 HIGH ACTIS  DEPUY ORTHOPA[employer] [id number] Left 1 Implanted  Ceramic head ball     [id number] Left 1 Implanted

## 2023-06-23 NOTE — Progress Notes (Addendum)
2007 Pt sweating & c/o feeling lightheaded & like he is going to pass-out while sitting on edge of bed. Assisted back to bed, now verbalized feeling much better. 2015 Reported feeling much better while resting in bed at this time. 2048 Denied SOB but reported he needs his breathing treatment as his body is accustomed to taking it. RT to be informed.

## 2023-06-23 NOTE — Anesthesia Procedure Notes (Signed)
Spinal  Patient location during procedure: OR Start time: 06/23/2023 7:28 AM End time: 06/23/2023 7:32 AM Reason for block: surgical anesthesia Staffing Performed: anesthesiologist  Anesthesiologist: [place] Nation, MD Performed by: [place] Nation, MD Authorized by: [place] Nation, MD   Preanesthetic Checklist Completed: patient identified, IV checked, site marked, risks and benefits discussed, surgical consent, monitors and equipment checked, pre-op evaluation and timeout performed Spinal Block Patient position: sitting Prep: DuraPrep Patient monitoring: heart rate, cardiac monitor, continuous pulse ox and blood pressure Approach: midline Location: L3-4 Injection technique: single-shot Needle Needle type: Sprotte  Needle gauge: 24 G Needle length: 9 cm Assessment Sensory level: T4 Events: CSF return

## 2023-06-23 NOTE — Anesthesia Postprocedure Evaluation (Signed)
Anesthesia Post Note  Patient: [name] [name]  Procedure(s) Performed: LEFT TOTAL HIP ARTHROPLASTY ANTERIOR APPROACH (Left: Hip)     Patient location during evaluation: PACU Anesthesia Type: Spinal Level of consciousness: oriented and awake and alert Pain management: pain level controlled Vital Signs Assessment: post-procedure vital signs reviewed and stable Respiratory status: spontaneous breathing, respiratory function stable and nonlabored ventilation Cardiovascular status: blood pressure returned to baseline and stable Postop Assessment: no headache, no backache, no apparent nausea or vomiting, spinal receding and patient able to bend at knees Anesthetic complications: no   No notable events documented.  Last Vitals:  Vitals:   06/23/23 1015 06/23/23 1030  BP: 130/88 127/84  Pulse: 76 78  Resp: 14 14  Temp:    SpO2: 92% 93%    Last Pain:  Vitals:   06/23/23 1030  PainSc: 1     LLE Motor Response: Purposeful movement (06/23/23 1030) LLE Sensation: Numbness (06/23/23 1030) RLE Motor Response: Purposeful movement (06/23/23 1030) RLE Sensation: Numbness (06/23/23 1030) L Sensory Level: S3-Medial thigh (06/23/23 1030) R Sensory Level: S3-Medial thigh (06/23/23 1030)  [name]

## 2023-06-23 NOTE — Evaluation (Signed)
Physical Therapy Evaluation Patient Details Name: [name] [name] MRN: [id number] DOB: [birth year] Today's Date: 06/23/2023  History of Present Illness  Pt is a 57 y.o. male who presents to [facility] hospital for elective L THA. PMH includes COPD, prediabetes.  Clinical Impression  Pt received in supine and agreeable to session. The pt presents to PT with deficits in LLE ROM, strength, and activity tolerance. The pt was able to ambulate household distances with the use of a RW and CGA. The pt had increased lightheadedness, nausea, and diaphoretic with hypotension following ambulation. Pt required assistance with elevating LLE onto the bed during sit to supine. The next treatment will focus on progressing gait training. PT reviewed exercise packet with pt, but recommends reviewing with pt again before discharge. The pt will continue to benefit from skilled PT to address remaining functional deficits.      If plan is discharge home, recommend the following: A little help with walking and/or transfers;A little help with bathing/dressing/bathroom;Assistance with cooking/housework;Assist for transportation   Can travel by private vehicle        Equipment Recommendations None recommended by PT  Recommendations for Other Services       Functional Status Assessment Patient has had a recent decline in their functional status and demonstrates the ability to make significant improvements in function in a reasonable and predictable amount of time.     Precautions / Restrictions Precautions Precautions: Fall Restrictions Weight Bearing Restrictions: Yes LLE Weight Bearing: Weight bearing as tolerated      Mobility  Bed Mobility Overal bed mobility: Needs Assistance Bed Mobility: Supine to Sit, Sit to Supine     Supine to sit: Modified independent (Device/Increase time), HOB elevated, Used rails (required increased time and bed rails) Sit to supine: Min assist, HOB elevated (Put required  assistance with elevating LLE onto bed)        Transfers Overall transfer level: Needs assistance Equipment used: Rolling walker (2 wheels) Transfers: Sit to/from Stand Sit to Stand: Contact guard assist                Ambulation/Gait Ambulation/Gait assistance: Contact guard assist Gait Distance (Feet): 75 Feet Assistive device: Rolling walker (2 wheels) Gait Pattern/deviations: Step-to pattern Gait velocity: decreased Gait velocity interpretation: <1.31 ft/sec, indicative of household Public librarian Bed    Modified Rankin (Stroke Patients Only)       Balance Overall balance assessment: Needs assistance Sitting-balance support: No upper extremity supported, Feet supported Sitting balance-Leahy Scale: Normal     Standing balance support: Bilateral upper extremity supported, During functional activity, Reliant on assistive device for balance Standing balance-Leahy Scale: Poor                               Pertinent Vitals/Pain Pain Assessment Pain Assessment: 0-10 Pain Score: 6  Pain Location: L hip Pain Descriptors / Indicators: Dull, Grimacing Pain Intervention(s): Monitored during session    Home Living Family/patient expects to be discharged to:: Private residence Living Arrangements: Spouse/significant other Available Help at Discharge: Family;Available 24 hours/day Type of Home: House Home Access: Stairs to enter Entrance Stairs-Rails: None Entrance Stairs-Number of Steps: 1   Home Layout: One level Home Equipment: Agricultural consultant (2 wheels)      Prior Function Prior Level of Function : Independent/Modified Independent  Mobility Comments: Pt ambulated without an AD prior to hospitalization ADLs Comments: independent with all ADLs     Extremity/Trunk Assessment   Upper Extremity Assessment Upper Extremity Assessment: Overall WFL for tasks assessed     Lower Extremity Assessment Lower Extremity Assessment: LLE deficits/detail LLE Deficits / Details: Generalized weakness as expected post-op day 0 LLE: Unable to fully assess due to pain    Cervical / Trunk Assessment Cervical / Trunk Assessment: Normal  Communication   Communication Communication: No apparent difficulties Cueing Techniques: Verbal cues;Visual cues  Cognition Arousal: Alert Behavior During Therapy: WFL for tasks assessed/performed Overall Cognitive Status: Within Functional Limits for tasks assessed                                          General Comments General comments (skin integrity, edema, etc.): Pt has a wound vac    Exercises     Assessment/Plan    PT Assessment Patient needs continued PT services  PT Problem List Decreased strength;Decreased range of motion;Decreased activity tolerance;Decreased balance;Decreased mobility       PT Treatment Interventions Gait training;DME instruction;Stair training;Functional mobility training;Therapeutic activities;Therapeutic exercise;Balance training;Neuromuscular re-education    PT Goals (Current goals can be found in the Care Plan section)  Acute Rehab PT Goals Patient Stated Goal: to go home PT Goal Formulation: With patient Time For Goal Achievement: 06/26/23 Potential to Achieve Goals: Good    Frequency Min 1X/week     Co-evaluation               AM-PAC PT "6 Clicks" Mobility  Outcome Measure Help needed turning from your back to your side while in a flat bed without using bedrails?: A Little Help needed moving from lying on your back to sitting on the side of a flat bed without using bedrails?: A Little Help needed moving to and from a bed to a chair (including a wheelchair)?: A Little Help needed standing up from a chair using your arms (e.g., wheelchair or bedside chair)?: A Little Help needed to walk in hospital room?: A Little Help needed climbing 3-5 steps with a  railing? : A Little 6 Click Score: 18    End of Session Equipment Utilized During Treatment: Gait belt Activity Tolerance: Patient limited by pain Patient left: in bed;with call bell/phone within reach;with family/visitor present Nurse Communication: Mobility status PT Visit Diagnosis: Other abnormalities of gait and mobility (R26.89);Muscle weakness (generalized) (M62.81);Pain Pain - Right/Left: Left Pain - part of body: Hip    Time: 2130-8657 PT Time Calculation (min) (ACUTE ONLY): 30 min   Charges:   PT Evaluation $PT Eval Low Complexity: 1 Low   PT General Charges $$ ACUTE PT VISIT: 1 Visit       Caryl Comes, SPT Acute Rehabilitation Office Phone [phone]   Caryl Comes 06/23/2023, 3:29 PM

## 2023-06-23 NOTE — Discharge Instructions (Signed)
INSTRUCTIONS AFTER JOINT REPLACEMENT   Remove items at home which could result in a fall. This includes throw rugs or furniture in walking pathways ICE to the affected joint every three hours while awake for 30 minutes at a time, for at least the first 3-5 days, and then as needed for pain and swelling.  Continue to use ice for pain and swelling. You may notice swelling that will progress down to the foot and ankle.  This is normal after surgery.  Elevate your leg when you are not up walking on it.   Continue to use the breathing machine you got in the hospital (incentive spirometer) which will help keep your temperature down.  It is common for your temperature to cycle up and down following surgery, especially at night when you are not up moving around and exerting yourself.  The breathing machine keeps your lungs expanded and your temperature down.   DIET:  As you were doing prior to hospitalization, we recommend a well-balanced diet.  DRESSING / WOUND CARE / SHOWERING  Do not get wound vac wet.  Follow up one week after surgery to have this removed and a new bandage applied    ACTIVITY  Increase activity slowly as tolerated, but follow the weight bearing instructions below.   No driving for 6 weeks or until further direction given by your physician.  You cannot drive while taking narcotics.  No lifting or carrying greater than 10 lbs. until further directed by your surgeon. Avoid periods of inactivity such as sitting longer than an hour when not asleep. This helps prevent blood clots.  You may return to work once you are authorized by your doctor.     WEIGHT BEARING   Weight bearing as tolerated with assist device (walker, cane, etc) as directed, use it as long as suggested by your surgeon or therapist, typically at least 4-6 weeks.   EXERCISES  Results after joint replacement surgery are often greatly improved when you follow the exercise, range of motion and muscle strengthening  exercises prescribed by your doctor. Safety measures are also important to protect the joint from further injury. Any time any of these exercises cause you to have increased pain or swelling, decrease what you are doing until you are comfortable again and then slowly increase them. If you have problems or questions, call your caregiver or physical therapist for advice.   Rehabilitation is important following a joint replacement. After just a few days of immobilization, the muscles of the leg can become weakened and shrink (atrophy).  These exercises are designed to build up the tone and strength of the thigh and leg muscles and to improve motion. Often times heat used for twenty to thirty minutes before working out will loosen up your tissues and help with improving the range of motion but do not use heat for the first two weeks following surgery (sometimes heat can increase post-operative swelling).   These exercises can be done on a training (exercise) mat, on the floor, on a table or on a bed. Use whatever works the best and is most comfortable for you.    Use music or television while you are exercising so that the exercises are a pleasant break in your day. This will make your life better with the exercises acting as a break in your routine that you can look forward to.   Perform all exercises about fifteen times, three times per day or as directed.  You should exercise both the  operative leg and the other leg as well.  Exercises include:   Quad Sets - Tighten up the muscle on the front of the thigh (Quad) and hold for 5-10 seconds.   Straight Leg Raises - With your knee straight (if you were given a brace, keep it on), lift the leg to 60 degrees, hold for 3 seconds, and slowly lower the leg.  Perform this exercise against resistance later as your leg gets stronger.  Leg Slides: Lying on your back, slowly slide your foot toward your buttocks, bending your knee up off the floor (only go as far as is  comfortable). Then slowly slide your foot back down until your leg is flat on the floor again.  Angel Wings: Lying on your back spread your legs to the side as far apart as you can without causing discomfort.  Hamstring Strength:  Lying on your back, push your heel against the floor with your leg straight by tightening up the muscles of your buttocks.  Repeat, but this time bend your knee to a comfortable angle, and push your heel against the floor.  You may put a pillow under the heel to make it more comfortable if necessary.   A rehabilitation program following joint replacement surgery can speed recovery and prevent re-injury in the future due to weakened muscles. Contact your doctor or a physical therapist for more information on knee rehabilitation.    CONSTIPATION  Constipation is defined medically as fewer than three stools per week and severe constipation as less than one stool per week.  Even if you have a regular bowel pattern at home, your normal regimen is likely to be disrupted due to multiple reasons following surgery.  Combination of anesthesia, postoperative narcotics, change in appetite and fluid intake all can affect your bowels.   YOU MUST use at least one of the following options; they are listed in order of increasing strength to get the job done.  They are all available over the counter, and you may need to use some, POSSIBLY even all of these options:    Drink plenty of fluids (prune juice may be helpful) and high fiber foods Colace 100 mg by mouth twice a day  Senokot for constipation as directed and as needed Dulcolax (bisacodyl), take with full glass of water  Miralax (polyethylene glycol) once or twice a day as needed.  If you have tried all these things and are unable to have a bowel movement in the first 3-4 days after surgery call either your surgeon or your primary doctor.    If you experience loose stools or diarrhea, hold the medications until you stool forms back  up.  If your symptoms do not get better within 1 week or if they get worse, check with your doctor.  If you experience "the worst abdominal pain ever" or develop nausea or vomiting, please contact the office immediately for further recommendations for treatment.   ITCHING:  If you experience itching with your medications, try taking only a single pain pill, or even half a pain pill at a time.  You can also use Benadryl over the counter for itching or also to help with sleep.   TED HOSE STOCKINGS:  Use stockings on both legs until for at least 2 weeks or as directed by physician office. They may be removed at night for sleeping.  MEDICATIONS:  See your medication summary on the "After Visit Summary" that nursing will review with you.  You may have some  home medications which will be placed on hold until you complete the course of blood thinner medication.  It is important for you to complete the blood thinner medication as prescribed.  PRECAUTIONS:  If you experience chest pain or shortness of breath - call 911 immediately for transfer to the hospital emergency department.   If you develop a fever greater that 101 F, purulent drainage from wound, increased redness or drainage from wound, foul odor from the wound/dressing, or calf pain - CONTACT YOUR SURGEON.                                                   FOLLOW-UP APPOINTMENTS:  If you do not already have a post-op appointment, please call the office for an appointment to be seen by your surgeon.  Guidelines for how soon to be seen are listed in your "After Visit Summary", but are typically between 1-4 weeks after surgery.  OTHER INSTRUCTIONS:   Knee Replacement:  Do not place pillow under knee, focus on keeping the knee straight while resting. CPM instructions: 0-90 degrees, 2 hours in the morning, 2 hours in the afternoon, and 2 hours in the evening. Place foam block, curve side up under heel at all times except when in CPM or when walking.  DO  NOT modify, tear, cut, or change the foam block in any way.  POST-OPERATIVE OPIOID TAPER INSTRUCTIONS: It is important to wean off of your opioid medication as soon as possible. If you do not need pain medication after your surgery it is ok to stop day one. Opioids include: Codeine, Hydrocodone(Norco, Vicodin), Oxycodone(Percocet, oxycontin) and hydromorphone amongst others.  Long term and even short term use of opiods can cause: Increased pain response Dependence Constipation Depression Respiratory depression And more.  Withdrawal symptoms can include Flu like symptoms Nausea, vomiting And more Techniques to manage these symptoms Hydrate well Eat regular healthy meals Stay active Use relaxation techniques(deep breathing, meditating, yoga) Do Not substitute Alcohol to help with tapering If you have been on opioids for less than two weeks and do not have pain than it is ok to stop all together.  Plan to wean off of opioids This plan should start within one week post op of your joint replacement. Maintain the same interval or time between taking each dose and first decrease the dose.  Cut the total daily intake of opioids by one tablet each day Next start to increase the time between doses. The last dose that should be eliminated is the evening dose.   Per [facility] clinic policy, our goal is ensure optimal postoperative pain control with a multimodal pain management strategy. For all [facility] patients, our goal is to wean post-operative narcotic medications by 6 weeks post-operatively. If this is not possible due to utilization of pain medication prior to surgery, your [facility] doctor will support your acute post-operative pain control for the first 6 weeks postoperatively, with a plan to transition you back to your primary pain team following that. Cyndia Skeeters will work to ensure a Therapist, occupational.   MAKE SURE YOU:  Understand these instructions.  Get help right away if you are not  doing well or get worse.    Thank you for letting us be a part of your medical care team.  It is a privilege we respect greatly.  We hope these instructions  will help you stay on track for a fast and full recovery!

## 2023-06-24 ENCOUNTER — Encounter (HOSPITAL_COMMUNITY): Payer: Self-pay | Admitting: Orthopaedic Surgery

## 2023-06-24 DIAGNOSIS — M1612 Unilateral primary osteoarthritis, left hip: Secondary | ICD-10-CM | POA: Diagnosis not present

## 2023-06-24 MED ORDER — MOMETASONE FURO-FORMOTEROL FUM 200-5 MCG/ACT IN AERO
2.0000 | INHALATION_SPRAY | Freq: Two times a day (BID) | RESPIRATORY_TRACT | Status: DC
  Administered 2023-06-24: 2 via RESPIRATORY_TRACT
  Filled 2023-06-24: qty 8.8

## 2023-06-24 NOTE — Plan of Care (Signed)

## 2023-06-24 NOTE — Discharge Summary (Signed)
Patient ID: [name] [name] MRN: [id number] DOB/AGE: 10/01/1965 57 y.o.  Admit date: 06/23/2023 Discharge date: 06/24/2023  Admission Diagnoses:  Principal Problem:   Primary osteoarthritis of left hip Active Problems:   Status post total replacement of left hip   Discharge Diagnoses:  Same  Past Medical History:  Diagnosis Date   Allergic rhinitis    Allergy    seasonal allergies   Angio-edema    Arthritis    Cancer (HCC)    basal cell-nose and left ear   Complication of anesthesia    difficulty waking up from lithotripsy in 2013. No issues since then.   COPD (chronic obstructive pulmonary disease) (HCC)    Glaucoma    has occasional pressure issues in eyes   History of chronic bronchitis    History of kidney stones    Left ureteral stone    Mild obstructive sleep apnea    per study 02-09-2015  recommended oral appliance / cpap-- pt tried intolerant   Moderate persistent asthma    pulmologist-  dr young   Pneumonia 1994   Pre-diabetes    Urticaria     Surgeries: Procedure(s): LEFT TOTAL HIP ARTHROPLASTY ANTERIOR APPROACH on 06/23/2023   Consultants:   Discharged Condition: Improved  Hospital Course: [name] [name] is an 57 y.o. male who was admitted 06/23/2023 for operative treatment ofPrimary osteoarthritis of left hip. Patient has severe unremitting pain that affects sleep, daily activities, and work/hobbies. After pre-op clearance the patient was taken to the operating room on 06/23/2023 and underwent  Procedure(s): LEFT TOTAL HIP ARTHROPLASTY ANTERIOR APPROACH.    Patient was given perioperative antibiotics:  Anti-infectives (From admission, onward)    Start     Dose/Rate Route Frequency Ordered Stop   06/23/23 1300  ceFAZolin (ANCEF) IVPB 2g/100 mL premix        2 g 200 mL/hr over 30 Minutes Intravenous Every 6 hours 06/23/23 1104 06/23/23 2300   06/23/23 0845  vancomycin (VANCOCIN) powder  Status:  Discontinued          As needed 06/23/23 0908  06/23/23 0930   06/23/23 0700  ceFAZolin (ANCEF) IVPB 3g/100 mL premix        3 g 200 mL/hr over 30 Minutes Intravenous On call to O.R. 06/23/23 0545 06/23/23 0727   06/23/23 0000  doxycycline (VIBRAMYCIN) 100 MG capsule        100 mg Oral 2 times daily 06/23/23 1636          Patient was given sequential compression devices, early ambulation, and chemoprophylaxis to prevent DVT.  Patient benefited maximally from hospital stay and there were no complications.    Recent vital signs: Patient Vitals for the past 24 hrs:  BP Temp Temp src Pulse Resp SpO2  06/24/23 0444 130/74 97.9 F (36.6 C) Oral 72 18 97 %  06/23/23 2346 133/70 97.6 F (36.4 C) Oral 71 18 95 %  06/23/23 2200 126/73 97.9 F (36.6 C) Oral 81 18 95 %  06/23/23 2120 -- -- -- 82 16 95 %  06/23/23 2007 100/64 98.5 F (36.9 C) Oral 77 -- 95 %  06/23/23 1945 127/78 98.2 F (36.8 C) Oral 84 18 96 %  06/23/23 1445 137/88 98 F (36.7 C) Oral 98 18 99 %  06/23/23 1421 102/63 -- -- 73 -- 94 %  06/23/23 1052 131/81 97.7 F (36.5 C) Oral 74 16 94 %  06/23/23 1030 127/84 -- -- 78 14 93 %  06/23/23 1015 130/88 -- --  76 14 92 %  06/23/23 1000 116/74 -- -- 79 17 93 %  06/23/23 0945 95/80 -- -- 83 20 92 %  06/23/23 0938 135/75 97.9 F (36.6 C) -- 82 12 95 %     Recent laboratory studies: No results for input(s): "WBC", "HGB", "HCT", "PLT", "NA", "K", "CL", "CO2", "BUN", "CREATININE", "GLUCOSE", "INR", "CALCIUM" in the last 72 hours.  Invalid input(s): "PT", "2"   Discharge Medications:   Allergies as of 06/24/2023   No Known Allergies      Medication List     STOP taking these medications    meloxicam 7.5 MG tablet Commonly known as: MOBIC   traMADol 50 MG tablet Commonly known as: ULTRAM       TAKE these medications    4-WAY NASAL SPRAY NA Place 1-2 sprays into the nose daily as needed (congestion).   AeroChamber MV inhaler Use as instructed   aspirin 81 MG chewable tablet Chew 1 tablet (81 mg  total) by mouth 2 (two) times daily. To be taken after surgery to prevent blood clots   bisacodyl 5 MG EC tablet Commonly known as: DULCOLAX Take 5 mg by mouth daily as needed for moderate constipation.   budesonide-formoterol 160-4.5 MCG/ACT inhaler Commonly known as: Symbicort USE 2 INHALATIONS BY MOUTH TWICE DAILY RINSE MOUTH AFTER USE   cholecalciferol 25 MCG (1000 UNIT) tablet Commonly known as: VITAMIN D3 Take 1,000 Units by mouth daily.   clorazepate 7.5 MG tablet Commonly known as: TRANXENE TAKE 1 TO 2 TABLETS BY MOUTH AT  NIGHT AS NEEDED FOR SLEEP   Compressor Nebulizer Misc 1 Device by Does not apply route 4 (four) times daily as needed.   docusate sodium 100 MG capsule Commonly known as: COLACE Take 1 capsule (100 mg total) by mouth 2 (two) times daily.   doxycycline 100 MG capsule Commonly known as: Vibramycin Take 1 capsule (100 mg total) by mouth 2 (two) times daily.   Fasenra Pen 30 MG/ML prefilled autoinjector Generic drug: benralizumab INJECT 30MG  SUBCUTANEOUSLY EVERY 8 WEEKS   fexofenadine 180 MG tablet Commonly known as: ALLEGRA Take 180 mg by mouth daily.   fluticasone 50 MCG/ACT nasal spray Commonly known as: FLONASE USE 2 SPRAYS IN BOTH NOSTRILS  DAILY   hydrocortisone 5 MG tablet Commonly known as: CORTEF Take 3 tablets (15 mg total) by mouth daily with breakfast AND 1 tablet (5 mg total) daily with supper.   ipratropium-albuterol 0.5-2.5 (3) MG/3ML Soln Commonly known as: DUONEB USE 1 VIAL VIA NEBULIZER 4 TIMES DAILY AS NEEDED   methocarbamol 500 MG tablet Commonly known as: ROBAXIN Take 1.5 tablets (750 mg total) by mouth every 6 (six) hours as needed for muscle spasms.   montelukast 10 MG tablet Commonly known as: SINGULAIR TAKE 1 TABLET BY MOUTH DAILY   multivitamin with minerals Tabs tablet Take 1 tablet by mouth daily.   ondansetron 4 MG tablet Commonly known as: ZOFRAN Take 1 tablet (4 mg total) by mouth every 6 (six) hours  as needed for nausea.   oxyCODONE 5 MG immediate release tablet Commonly known as: Oxy IR/ROXICODONE Take 1-2 tablets (5-10 mg total) by mouth every 6 (six) hours as needed for moderate pain (pain score 4-6) (pain score 4-6).   Proventil HFA 108 (90 Base) MCG/ACT inhaler Generic drug: albuterol Inhale 1-2 puffs into the lungs every 6 (six) hours as needed for wheezing or shortness of breath.   Semaglutide(0.25 or 0.5MG /DOS) 2 MG/3ML Sopn Inject 0.5 mg into the skin  once a week. What changed: how much to take   sildenafil 100 MG tablet Commonly known as: Viagra Take 0.5-1 tablets (50-100 mg total) by mouth daily as needed for erectile dysfunction.   TURMERIC COMPLEX/BLACK PEPPER PO Take 1 tablet by mouth daily.               Durable Medical Equipment  (From admission, onward)           Start     Ordered   06/23/23 1104  DME Walker rolling  Once       Question:  Patient needs a walker to treat with the following condition  Answer:  History of hip replacement   06/23/23 1103   06/23/23 1104  DME 3 n 1  Once        06/23/23 1103   06/23/23 1104  DME Bedside commode  Once       Question:  Patient needs a bedside commode to treat with the following condition  Answer:  History of hip replacement   06/23/23 1103            Diagnostic Studies: DG Pelvis Portable  Result Date: 06/23/2023 CLINICAL DATA:  Postop left hip arthroplasty. EXAM: PORTABLE PELVIS 1-2 VIEWS COMPARISON:  None Available. FINDINGS: Left hip arthroplasty in expected alignment. No periprosthetic lucency or fracture. Recent postsurgical change includes air and edema in the soft tissues. IMPRESSION: Left hip arthroplasty without immediate postoperative complication. Electronically Signed   By: Narda Rutherford M.D.   On: 06/23/2023 11:27   DG HIP UNILAT WITH PELVIS 1V LEFT  Result Date: 06/23/2023 CLINICAL DATA:  Elective surgery. EXAM: DG HIP (WITH OR WITHOUT PELVIS) 1V*L* COMPARISON:  None Available.  FINDINGS: Four fluoroscopic spot views of the pelvis and left hip obtained in the operating room. Sequential images during hip arthroplasty. Fluoroscopy time 37 seconds. Dose 8.81 mGy. IMPRESSION: Intraoperative fluoroscopy during left hip arthroplasty. Electronically Signed   By: Narda Rutherford M.D.   On: 06/23/2023 11:26   DG C-Arm 1-60 Min-No Report  Result Date: 06/23/2023 Fluoroscopy was utilized by the requesting physician.  No radiographic interpretation.   DG C-Arm 1-60 Min-No Report  Result Date: 06/23/2023 Fluoroscopy was utilized by the requesting physician.  No radiographic interpretation.    Disposition: Discharge disposition: 01-Home or Self Care          Follow-up Information     Cristie Hem, PA-C. Schedule an appointment as soon as possible for a visit in 1 week(s).   Specialty: Orthopedic Surgery Contact information: [address] [place] [address] [phone]         Arsenio Loader, [facility] Follow up.   Why: the home health agency will contact you for the first home visit. Contact information: [address] [place] [address] [phone]                  Signed: Cristie Hem 06/24/2023, 8:26 AM

## 2023-06-24 NOTE — Progress Notes (Signed)
Physical Therapy Treatment Patient Details Name: [name] [name] MRN: [id number] DOB: March 09, 1966 Today's Date: 06/24/2023   History of Present Illness Pt is a 57 y.o. male who presents to [facility] hospital for elective L THA. PMH includes COPD, prediabetes.    PT Comments  Pt received in supine, pleasantly agreeable to therapy session and with good participation and tolerance for transfer, gait and curb step training. Pt with good RW management and initial BP drop upon standing but after 5 min standing BP reading, BP stable and pt without symptoms of hypotension throughout. Good tolerance for exercises (handout to reinforce), transfer and gait training. Pt instructed on use of iceman and car transfer, pt receptive. Pt continues to benefit from PT services to progress toward functional mobility goals. Anticipate pt safe to DC home from a functional mobility standpoint once medically cleared.   Vital Signs  Patient Position (if appropriate) Orthostatic Vitals  Orthostatic Lying   BP- Lying 141/76  Pulse- Lying 77  Orthostatic Sitting  BP- Sitting 141/77  Pulse- Sitting 84  Orthostatic Standing at 0 minutes  BP- Standing at 0 minutes 115/85 (asymptomatic)  Orthostatic Standing at 3 minutes  BP- Standing at 3 minutes 136/78     If plan is discharge home, recommend the following: A little help with walking and/or transfers;A little help with bathing/dressing/bathroom;Assistance with cooking/housework;Assist for transportation   Can travel by private vehicle        Equipment Recommendations  None recommended by PT    Recommendations for Other Services       Precautions / Restrictions Precautions Precautions: Fall Precaution Comments: watch BP if symptoms Restrictions Weight Bearing Restrictions: Yes LLE Weight Bearing: Weight bearing as tolerated     Mobility  Bed Mobility Overal bed mobility: Needs Assistance Bed Mobility: Supine to Sit     Supine to sit: Modified independent  (Device/Increase time) (gait belt as leg lifter PRN)          Transfers Overall transfer level: Needs assistance Equipment used: Rolling walker (2 wheels) Transfers: Sit to/from Stand Sit to Stand: Supervision           General transfer comment: From EOB and to chair heights, cues for technique for decreased pain    Ambulation/Gait Ambulation/Gait assistance: Contact guard assist, Supervision Gait Distance (Feet): 75 Feet Assistive device: Rolling walker (2 wheels) Gait Pattern/deviations: Step-to pattern, Step-through pattern, Antalgic Gait velocity: decreased     General Gait Details: mildly antalgic gait pattern, pt with good use of RW for offloading PRN for pain, no LOB or buckling, no orthostatic symptoms   Stairs Stairs: Yes Stairs assistance: Contact guard assist Stair Management: No rails, Step to pattern, Backwards, Forwards, With walker Number of Stairs: 2 General stair comments: single curb step x2 reps, one forward with RW and one backward with RW, no buckling or LOB, CGA for safety only, pt managing RW on his own   Wheelchair Mobility     Tilt Bed    Modified Rankin (Stroke Patients Only)       Balance Overall balance assessment: Needs assistance Sitting-balance support: No upper extremity supported, Feet supported Sitting balance-Leahy Scale: Normal     Standing balance support: Bilateral upper extremity supported, During functional activity, Reliant on assistive device for balance Standing balance-Leahy Scale: Poor                              Cognition Arousal: Alert Behavior During Therapy: [facility] for  tasks assessed/performed Overall Cognitive Status: Within Functional Limits for tasks assessed                                          Exercises Total Joint Exercises Ankle Circles/Pumps: AROM, Both, 15 reps, Supine Quad Sets: AROM, Both, 10 reps, Supine Towel Squeeze: AROM, Both, 10 reps, Supine Heel  Slides: AROM, Left, 10 reps, Supine Hip ABduction/ADduction: AAROM, Left, 5 reps, Supine Long Arc Quad: AROM, Left, 5 reps, Seated Standing Hip Extension:  (defer due to pain/fatigue, pt wanting to eat lunch; pt has handout to perform with HHPT)    General Comments General comments (skin integrity, edema, etc.): wound vac appears c/d/i; new wound vac placed on charger so it will be ready for him for DC      Pertinent Vitals/Pain Pain Assessment Pain Assessment: 0-10 Pain Score: 3  Pain Location: L hip Pain Descriptors / Indicators: Dull, Grimacing, Operative site guarding Pain Intervention(s): Limited activity within patient's tolerance, Monitored during session, Repositioned, Premedicated before session, Ice applied    Home Living                          Prior Function            PT Goals (current goals can now be found in the care plan section) Acute Rehab PT Goals Patient Stated Goal: to go home PT Goal Formulation: With patient Time For Goal Achievement: 06/26/23 Progress towards PT goals: Progressing toward goals    Frequency    Min 1X/week      PT Plan      Co-evaluation              AM-PAC PT "6 Clicks" Mobility   Outcome Measure  Help needed turning from your back to your side while in a flat bed without using bedrails?: A Little Help needed moving from lying on your back to sitting on the side of a flat bed without using bedrails?: A Little Help needed moving to and from a bed to a chair (including a wheelchair)?: A Little Help needed standing up from a chair using your arms (e.g., wheelchair or bedside chair)?: A Little Help needed to walk in hospital room?: A Little Help needed climbing 3-5 steps with a railing? : A Little 6 Click Score: 18    End of Session Equipment Utilized During Treatment: Gait belt Activity Tolerance: Patient tolerated treatment well Patient left: with call bell/phone within reach;in chair;with chair alarm set  (iceman over his L hip) Nurse Communication: Mobility status;Other (comment) (pt asking about DME, HH services) PT Visit Diagnosis: Other abnormalities of gait and mobility (R26.89);Muscle weakness (generalized) (M62.81);Pain Pain - Right/Left: Left Pain - part of body: Hip     Time: 1100-1146 PT Time Calculation (min) (ACUTE ONLY): 46 min  Charges:    $Gait Training: 8-22 mins $Therapeutic Exercise: 8-22 mins $Therapeutic Activity: 8-22 mins PT General Charges $$ ACUTE PT VISIT: 1 Visit                     Jerran Tappan P., PTA Acute Rehabilitation Services Secure Chat Preferred 9a-5:30pm Office: [phone]    Dorathy [facility] 06/24/2023, 12:14 PM

## 2023-06-24 NOTE — Progress Notes (Signed)
Subjective: 1 Day Post-Op Procedure(s) (LRB): LEFT TOTAL HIP ARTHROPLASTY ANTERIOR APPROACH (Left) Patient reports pain as mild.    Objective: Vital signs in last 24 hours: Temp:  [97.6 F (36.4 C)-98.5 F (36.9 C)] 97.9 F (36.6 C) (12/03 0444) Pulse Rate:  [71-98] 72 (12/03 0444) Resp:  [12-20] 18 (12/03 0444) BP: (95-137)/(63-88) 130/74 (12/03 0444) SpO2:  [92 %-99 %] 97 % (12/03 0444)  Intake/Output from previous day: 12/02 0701 - 12/03 0700 In: 1395.8 [P.O.:200; I.V.:1095.8; IV Piggyback:100] Out: 1600 [Urine:1400; Blood:200] Intake/Output this shift: No intake/output data recorded.  No results for input(s): "HGB" in the last 72 hours. No results for input(s): "WBC", "RBC", "HCT", "PLT" in the last 72 hours. No results for input(s): "NA", "K", "CL", "CO2", "BUN", "CREATININE", "GLUCOSE", "CALCIUM" in the last 72 hours. No results for input(s): "LABPT", "INR" in the last 72 hours.  Neurologically intact Neurovascular intact Sensation intact distally Intact pulses distally Dorsiflexion/Plantar flexion intact Incision: dressing C/D/I No cellulitis present Compartment soft   Assessment/Plan: 1 Day Post-Op Procedure(s) (LRB): LEFT TOTAL HIP ARTHROPLASTY ANTERIOR APPROACH (Left) Advance diet Up with therapy D/C IV fluids Discharge home with home health as long as no more near syncopal episodes, able to urinate on his own and is cleared by PT WBAT LLE      Cristie Hem 06/24/2023, 8:22 AM

## 2023-06-30 ENCOUNTER — Telehealth: Payer: Self-pay | Admitting: Physician Assistant

## 2023-06-30 NOTE — Telephone Encounter (Signed)
Pt called requesting a call back. Pt complains of below the incision pains. He also complains of swelling. Pt states it is not hot to the touch but having severe pains. Pt has an appt tomorrow but need a call back sometime today. Please call pt at [phone].

## 2023-06-30 NOTE — Telephone Encounter (Signed)
Spoke with patient and relayed what Dr.Xu said.

## 2023-06-30 NOTE — Telephone Encounter (Signed)
Sounds like normal postop stuff.  Keep doing what he's doing.

## 2023-07-01 ENCOUNTER — Ambulatory Visit (INDEPENDENT_AMBULATORY_CARE_PROVIDER_SITE_OTHER): Payer: 59 | Admitting: Physician Assistant

## 2023-07-01 DIAGNOSIS — Z96642 Presence of left artificial hip joint: Secondary | ICD-10-CM

## 2023-07-01 MED ORDER — OXYCODONE-ACETAMINOPHEN 7.5-325 MG PO TABS: 1.0000 | ORAL_TABLET | Freq: Four times a day (QID) | ORAL | 0 refills | Status: DC | PRN

## 2023-07-01 NOTE — Progress Notes (Signed)
Post-Op Visit Note   Patient: [name] [name]           Date of Birth: Oct 07, 1965           MRN: [id number] Visit Date: 07/01/2023 PCP: Olive Bass, FNP   Assessment & Plan:  Chief Complaint:  Chief Complaint  Patient presents with   Left Hip - Follow-up    Left total hip arthroplasty 06/23/2023   Visit Diagnoses:  1. Status post total replacement of left hip     Plan: Patient is a pleasant 57 year old gentleman who comes in today 1 week status post left total hip replacement 06-23-23.  He is here to have his IVAC removed and Aquacel applied.  He has been in significant amount of pain since Saturday.  He has been getting home health physical therapy and is ambulating with a walker.  He is taking oxycodone and Robaxin without long-lasting relief.  He has been compliant taking a baby aspirin twice daily for DVT prophylaxis.  Examination of the left hip reveals a well healing surgical incision with nylon sutures in place.  No evidence of infection or cellulitis.  Calf is soft nontender.  He is neurovascularly intact distally.  Today, Aquacel bandage was applied.  I have recommended that he stop physical therapy and just focus on walking.  I have refilled his Percocet but has increased it to 7.5.  I have also told him that he may increase the frequency of the Robaxin.  He will follow-up with [place] next week for suture removal.  Call with concerns or questions.  Follow-Up Instructions: Return in about 1 week (around 07/08/2023).   Orders:  No orders of the defined types were placed in this encounter.  Meds ordered this encounter  Medications   oxyCODONE-acetaminophen (PERCOCET) 7.5-325 MG tablet    Sig: Take 1-2 tablets by mouth every 6 (six) hours as needed for severe pain (pain score 7-10).    Dispense:  40 tablet    Refill:  0    Imaging: No new imaging  PMFS History: Patient Active Problem List   Diagnosis Date Noted   Status post total replacement of left hip  06/23/2023   Primary osteoarthritis of left hip 06/22/2023   Prediabetes 04/25/2023   Arthralgia 01/17/2023   Secondary adrenal insufficiency (HCC) 01/17/2023   Adrenal insufficiency due to corticosteroid withdrawal (HCC) 04/22/2022   Sialadenitis 06/11/2021   Hx of adenomatous colonic polyps 04/06/2021   History of colon polyps 03/01/2020   Abnormal colonoscopy 03/01/2020   Recurrent urticaria/pruritus 09/21/2019   History of nasal polyposis 09/21/2019   Contact dermatitis 07/01/2019   Acute kidney injury (HCC) 08/08/2015   Hyperkalemia 08/08/2015   Acute pyelonephritis    Left ureteral stone 08/07/2015   Pyonephrosis 08/07/2015   Acute renal insufficiency 08/07/2015   Nonallopathic lesion of thoracic region 03/15/2015   Lateral epicondylitis of right elbow 02/22/2015   Obstructive sleep apnea 01/28/2015   Triceps tendinitis 01/11/2015   Back pain 12/21/2014   Anxiety state 02/06/2014   Thrush of mouth and esophagus (HCC) 06/20/2011   Sinusitis, maxillary, chronic 11/18/2010   ALLERGIC CONJUNCTIVITIS 08/25/2007   Perennial allergic rhinitis 08/25/2007   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) 08/25/2007   Past Medical History:  Diagnosis Date   Allergic rhinitis    Allergy    seasonal allergies   Angio-edema    Arthritis    Cancer (HCC)    basal cell-nose and left ear   Complication of anesthesia  difficulty waking up from lithotripsy in 2013. No issues since then.   COPD (chronic obstructive pulmonary disease) (HCC)    Glaucoma    has occasional pressure issues in eyes   History of chronic bronchitis    History of kidney stones    Left ureteral stone    Mild obstructive sleep apnea    per study 02-09-2015  recommended oral appliance / cpap-- pt tried intolerant   Moderate persistent asthma    pulmologist-  dr young   Pneumonia 1994   Pre-diabetes    Urticaria     Family History  Problem Relation Age of Onset   Asthma Father     Other Father        bronchitis   Allergic rhinitis Father    Diabetes Maternal Grandfather 24       Heavy smoker   Eczema Neg Hx    Immunodeficiency Neg Hx    Urticaria Neg Hx    Colon polyps Neg Hx    Colon cancer Neg Hx    Esophageal cancer Neg Hx    Rectal cancer Neg Hx    Stomach cancer Neg Hx    Inflammatory bowel disease Neg Hx    Liver disease Neg Hx    Pancreatic cancer Neg Hx     Past Surgical History:  Procedure Laterality Date   BIOPSY  07/18/2021   Procedure: BIOPSY;  Surgeon: Lemar Lofty., MD;  Location: [facility];  Service: Gastroenterology;;   COLONOSCOPY  12/07/2019   polyps   COLONOSCOPY WITH PROPOFOL N/A 05/08/2020   Procedure: COLONOSCOPY WITH PROPOFOL;  Surgeon: Lemar Lofty., MD;  Location: [facility] ENDOSCOPY;  Service: Gastroenterology;  Laterality: N/A;   COLONOSCOPY WITH PROPOFOL N/A 07/18/2021   Procedure: COLONOSCOPY WITH PROPOFOL;  Surgeon: Meridee Score Netty Starring., MD;  Location: [facility];  Service: Gastroenterology;  Laterality: N/A;   CYSTO/  LEFT URETERAL STENT PLACEMENT  03/10/2002   CYSTOSCOPY W/ URETERAL STENT PLACEMENT Left 08/07/2015   Procedure: CYSTOSCOPY, Left Retrograde Pyelogram, Left Ureteral Stent Placement;  Surgeon: Bjorn Pippin, MD;  Location: [facility];  Service: Urology;  Laterality: Left;   CYSTOSCOPY/URETEROSCOPY/HOLMIUM LASER/STENT PLACEMENT Left 08/17/2015   Procedure: CYSTOSCOPY / LEFT URETEROSCOPY / HOLMIUM LASER LITHOTRIPSY;  Surgeon: Bjorn Pippin, MD;  Location: [facility];  Service: Urology;  Laterality: Left;   ENDOSCOPIC MUCOSAL RESECTION N/A 05/08/2020   Procedure: ENDOSCOPIC MUCOSAL RESECTION;  Surgeon: Meridee Score Netty Starring., MD;  Location: [facility] ENDOSCOPY;  Service: Gastroenterology;  Laterality: N/A;   ENDOSCOPIC MUCOSAL RESECTION N/A 07/18/2021   Procedure: ENDOSCOPIC MUCOSAL RESECTION;  Surgeon: Meridee Score Netty Starring., MD;  Location: [facility];  Service: Gastroenterology;  Laterality:  N/A;   EXTRACORPOREAL SHOCK WAVE LITHOTRIPSY  2006   HEMOSTASIS CLIP PLACEMENT  05/08/2020   Procedure: HEMOSTASIS CLIP PLACEMENT;  Surgeon: Lemar Lofty., MD;  Location: [facility] ENDOSCOPY;  Service: Gastroenterology;;   POLYPECTOMY  05/08/2020   Procedure: POLYPECTOMY;  Surgeon: Lemar Lofty., MD;  Location: [name] ENDOSCOPY;  Service: Gastroenterology;;   POLYPECTOMY  07/18/2021   Procedure: POLYPECTOMY;  Surgeon: Lemar Lofty., MD;  Location: [facility];  Service: Gastroenterology;;   SEPTOPLASTY WITH ETHMOIDECTOMY, AND MAXILLARY ANTROSTOMY  09/24/2010   SINOSCOPY     STONE EXTRACTION WITH BASKET Left 08/17/2015   Procedure: STONE EXTRACTION WITH BASKET;  Surgeon: Bjorn Pippin, MD;  Location: [facility];  Service: Urology;  Laterality: Left;   SUBMUCOSAL LIFTING INJECTION  05/08/2020   Procedure: SUBMUCOSAL LIFTING INJECTION;  Surgeon: Corliss Parish  Montez Hageman., MD;  Location: [facility] ENDOSCOPY;  Service: Gastroenterology;;   TOTAL HIP ARTHROPLASTY Left 06/23/2023   Procedure: LEFT TOTAL HIP ARTHROPLASTY ANTERIOR APPROACH;  Surgeon: Tarry Kos, MD;  Location: [facility] OR;  Service: Orthopedics;  Laterality: Left;  3-C   WISDOM TOOTH EXTRACTION  1984   Social History   Occupational History   Not on file  Tobacco Use   Smoking status: Never   Smokeless tobacco: Never  Vaping Use   Vaping status: Never Used  Substance and Sexual Activity   Alcohol use: Not Currently   Drug use: Never   Sexual activity: Yes

## 2023-07-07 ENCOUNTER — Other Ambulatory Visit: Payer: Self-pay | Admitting: Physician Assistant

## 2023-07-08 ENCOUNTER — Ambulatory Visit: Payer: 59 | Admitting: Physician Assistant

## 2023-07-08 ENCOUNTER — Encounter: Payer: Self-pay | Admitting: Physician Assistant

## 2023-07-08 DIAGNOSIS — Z96642 Presence of left artificial hip joint: Secondary | ICD-10-CM

## 2023-07-08 MED ORDER — BISACODYL 5 MG PO TBEC: 5.0000 mg | DELAYED_RELEASE_TABLET | Freq: Every day | ORAL | 2 refills | Status: DC | PRN

## 2023-07-08 MED ORDER — METHOCARBAMOL 500 MG PO TABS: 750.0000 mg | ORAL_TABLET | Freq: Four times a day (QID) | ORAL | 2 refills | Status: DC | PRN

## 2023-07-08 MED ORDER — OXYCODONE-ACETAMINOPHEN 7.5-325 MG PO TABS: 1.0000 | ORAL_TABLET | Freq: Four times a day (QID) | ORAL | 0 refills | Status: DC | PRN

## 2023-07-08 MED ORDER — DOCUSATE SODIUM 100 MG PO CAPS: 100.0000 mg | ORAL_CAPSULE | Freq: Two times a day (BID) | ORAL | 0 refills | Status: DC

## 2023-07-08 NOTE — Progress Notes (Signed)
Post-Op Visit Note   Patient: [name] [name]           Date of Birth: Oct 18, 1965           MRN: [id number] Visit Date: 07/08/2023 PCP: Olive Bass, FNP   Assessment & Plan:  Chief Complaint:  Chief Complaint  Patient presents with   Left Hip - Follow-up    Left total hip arthroplasty 06/23/2023   Visit Diagnoses:  1. Status post total replacement of left hip     Plan: Patient is a pleasant 57 year old gentleman who comes in today 2 weeks status post left total hip replacement 06-23-23.  He has been in a fair amount of pain since surgery.  He has been taking 2 oxycodones and 1 Robaxin every 6 hours.  He has been compliant taking baby aspirin twice daily for DVT prophylaxis.  Currently ambulating with a walker.  Examination of the left hip reveals a well healing surgical incision with nylon sutures in place.  No evidence of infection or cellulitis.  His calf is soft and nontender.  Mild swelling.  He is neurovascularly intact distally.  Today, sutures were removed and Steri-Strips applied.  Continue to focus on ambulating for now.  Continue taking his baby aspirin twice daily for DVT prophylaxis.  Have recommended continuing with ice.  He will follow-up with [place] in 4 weeks for repeat evaluation and AP pelvis x-rays.  Call with concerns or questions.  Follow-Up Instructions: Return in about 4 weeks (around 08/05/2023).   Orders:  No orders of the defined types were placed in this encounter.  Meds ordered this encounter  Medications   bisacodyl (DULCOLAX) 5 MG EC tablet    Sig: Take 1 tablet (5 mg total) by mouth daily as needed for moderate constipation.    Dispense:  30 tablet    Refill:  2   docusate sodium (COLACE) 100 MG capsule    Sig: Take 1 capsule (100 mg total) by mouth 2 (two) times daily.    Dispense:  10 capsule    Refill:  0   methocarbamol (ROBAXIN) 500 MG tablet    Sig: Take 1.5 tablets (750 mg total) by mouth every 6 (six) hours as needed for muscle  spasms.    Dispense:  20 tablet    Refill:  2   oxyCODONE-acetaminophen (PERCOCET) 7.5-325 MG tablet    Sig: Take 1-2 tablets by mouth every 6 (six) hours as needed for severe pain (pain score 7-10).    Dispense:  40 tablet    Refill:  0    Imaging: No new imaging  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) 04/22/2022   Sialadenitis 06/11/2021   Hx of adenomatous colonic polyps 04/06/2021   History of colon polyps 03/01/2020   Abnormal colonoscopy 03/01/2020   Recurrent urticaria/pruritus 09/21/2019   History of nasal polyposis 09/21/2019   Contact dermatitis 07/01/2019   Acute kidney injury (HCC) 08/08/2015   Hyperkalemia 08/08/2015   Acute pyelonephritis    Left ureteral stone 08/07/2015   Pyonephrosis 08/07/2015   Acute renal insufficiency 08/07/2015   Nonallopathic lesion of thoracic region 03/15/2015   Lateral epicondylitis of right elbow 02/22/2015   Obstructive sleep apnea 01/28/2015   Triceps  tendinitis 01/11/2015   Back pain 12/21/2014   Anxiety state 02/06/2014   Thrush of mouth and esophagus (HCC) 06/20/2011   Sinusitis, maxillary, chronic 11/18/2010   ALLERGIC CONJUNCTIVITIS 08/25/2007   Perennial allergic rhinitis 08/25/2007   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 No issues since then.   COPD (chronic obstructive pulmonary disease) (HCC)    Glaucoma    has occasional pressure  issues in eyes   History of chronic bronchitis    History of kidney stones    Left ureteral stone    Mild obstructive sleep apnea    per study 02-09-2015  recommended oral appliance / cpap-- pt tried intolerant   Moderate persistent asthma    pulmologist-  dr young   Pneumonia 1994   Pre-diabetes    Urticaria     Family History  Problem Relation Age of Onset   Asthma Father    Other Father        bronchitis   Allergic rhinitis Father    Diabetes Maternal Grandfather 72       Heavy smoker   Eczema Neg Hx    Immunodeficiency Neg Hx    Urticaria Neg Hx    Colon polyps Neg Hx    Colon cancer Neg Hx    Esophageal cancer Neg Hx    Rectal cancer Neg Hx    Stomach cancer Neg Hx    Inflammatory bowel disease Neg Hx    Liver disease Neg Hx    Pancreatic cancer Neg Hx     Past Surgical History:  Procedure Laterality Date   BIOPSY  07/18/2021   Procedure: BIOPSY;  Surgeon: Lemar Lofty., MD;  Location: [facility];  Service: Gastroenterology;;   COLONOSCOPY  12/07/2019   polyps   COLONOSCOPY WITH PROPOFOL N/A 05/08/2020   Procedure: COLONOSCOPY WITH PROPOFOL;  Surgeon: Lemar Lofty., MD;  Location: [facility] END[facility]COPY;  Service: Gastroenterology;  Laterality: N/A;   COLONOSCOPY WITH PROPOFOL N/A 07/18/2021   Procedure: COLONOSCOPY WITH PROPOFOL;  Surgeon: Meridee Score Netty Starring., MD;  Location: [facility];  Service: Gastroenterology;  Laterality: N/A;   CYSTO/  LEFT URETERAL STENT PLACEMENT  03/10/2002   CYSTOSCOPY W/ URETERAL STENT PLACEMENT Left 08/07/2015   Procedure: CYSTOSCOPY, Left Retrograde Pyelogram, Left Ureteral Stent Placement;  Surgeon: Bjorn Pippin, MD;  Location: [facility];  Service: Urology;  Laterality: Left;   CYSTOSCOPY/URETEROSCOPY/HOLMIUM LASER/STENT PLACEMENT Left 08/17/2015   Procedure: CYSTOSCOPY / LEFT URETEROSCOPY / HOLMIUM LASER LITHOTRIPSY;  Surgeon: Bjorn Pippin, MD;  Location: [facility];  Service: Urology;  Laterality: Left;    ENDOSCOPIC MUCOSAL RESECTION N/A 05/08/2020   Procedure: ENDOSCOPIC MUCOSAL RESECTION;  Surgeon: Meridee Score Netty Starring., MD;  Location: [facility] ENDOSCOPY;  Service: Gastroenterology;  Laterality: N/A;   ENDOSCOPIC MUCOSAL RESECTION N/A 07/18/2021   Procedure: ENDOSCOPIC MUCOSAL RESECTION;  Surgeon: Meridee Score Netty Starring., MD;  Location: [facility];  Service: Gastroenterology;  Laterality: N/A;   EXTRACORPOREAL SHOCK WAVE LITHOTRIPSY  2006   HEMOSTASIS CLIP PLACEMENT  05/08/2020   Procedure: HEMOSTASIS CLIP PLACEMENT;  Surgeon: Lemar Lofty., MD;  Location: [facility] ENDOSCOPY;  Service: Gastroenterology;;   POLYPECTOMY  05/08/2020   Procedure: POLYPECTOMY;  Surgeon:  Mansouraty, Netty Starring., MD;  Location: [facility] ENDOSCOPY;  Service: Gastroenterology;;   POLYPECTOMY  07/18/2021   Procedure: POLYPECTOMY;  Surgeon: Lemar Lofty., MD;  Location: [facility];  Service: Gastroenterology;;   SEPTOPLASTY WITH ETHMOIDECTOMY, AND MAXILLARY ANTROSTOMY  09/24/2010   SINOSCOPY     STONE EXTRACTION WITH BASKET Left 08/17/2015   Procedure: STONE EXTRACTION WITH BASKET;  Surgeon: Bjorn Pippin, MD;  Location: [facility];  Service: Urology;  Laterality: Left;   SUBMUCOSAL LIFTING INJECTION  05/08/2020   Procedure: SUBMUCOSAL LIFTING INJECTION;  Surgeon: Lemar Lofty., MD;  Location: [facility] ENDOSCOPY;  Service: Gastroenterology;;   TOTAL HIP ARTHROPLASTY Left 06/23/2023   Procedure: LEFT TOTAL HIP ARTHROPLASTY ANTERIOR APPROACH;  Surgeon: Tarry Kos, MD;  Location: [facility];  Service: Orthopedics;  Laterality: Left;  3-C   WISDOM TOOTH EXTRACTION  1984   Social History   Occupational History   Not on file  Tobacco Use   Smoking status: Never   Smokeless tobacco: Never  Vaping Use   Vaping status: Never Used  Substance and Sexual Activity   Alcohol use: Not Currently   Drug use: Never   Sexual activity: Yes

## 2023-07-17 ENCOUNTER — Telehealth: Payer: Self-pay | Admitting: Orthopedic Surgery

## 2023-07-17 NOTE — Telephone Encounter (Signed)
Called patient. Can stand without walker. Some pain in lower back with standing.  Is able to get in and out of tub/shower with assistance.  Leg quivers if he stretches upon getting up. Stiff feeling.  Just over 3 weeks out from surgery for THA.  Trying to limit his pain medication, and only when he needs it. Taking the muscle relaxers.  Should he be trying stairs?  HHPT only lasted for 2 weeks.  Should he do some outpatient PT? He has been walking.  All this normal? Patient aware that Mardella Layman will not be here until next week.

## 2023-07-17 NOTE — Telephone Encounter (Signed)
Sounds like he's quite well and right on track

## 2023-07-17 NOTE — Telephone Encounter (Signed)
[name] [name] called in requesting a return call from [place].  He would like to discuss with her some questions he has about his recovery progress from left total hip arthroplasty.  How well does she feel he is doing?

## 2023-07-18 MED ORDER — HYDROCODONE-ACETAMINOPHEN 5-325 MG PO TABS: 1.0000 | ORAL_TABLET | Freq: Every day | ORAL | 0 refills | Status: DC | PRN

## 2023-07-18 MED ORDER — DOCUSATE SODIUM 100 MG PO CAPS: 100.0000 mg | ORAL_CAPSULE | Freq: Two times a day (BID) | ORAL | 0 refills | Status: DC

## 2023-07-18 NOTE — Telephone Encounter (Signed)
Spoke with patient. He wants a refill of pain medicine and stool softner sent to [facility] on [address].

## 2023-07-18 NOTE — Addendum Note (Signed)
Addended by: Mayra Reel on: 07/18/2023 01:51 PM   Modules accepted: Orders

## 2023-07-18 NOTE — Telephone Encounter (Signed)
Done

## 2023-07-24 ENCOUNTER — Other Ambulatory Visit: Payer: Self-pay | Admitting: Internal Medicine

## 2023-07-25 NOTE — Telephone Encounter (Signed)
Tranxene refilled

## 2023-07-29 ENCOUNTER — Encounter: Payer: Self-pay | Admitting: Orthopaedic Surgery

## 2023-07-29 ENCOUNTER — Telehealth: Payer: Self-pay | Admitting: Physician Assistant

## 2023-07-29 ENCOUNTER — Telehealth: Payer: Self-pay | Admitting: Orthopaedic Surgery

## 2023-07-29 NOTE — Telephone Encounter (Signed)
 Yes

## 2023-07-29 NOTE — Telephone Encounter (Signed)
 Spoke with patient and provided letter. Faxed to number provided via MyChart message.

## 2023-07-29 NOTE — Telephone Encounter (Signed)
 Responded via MyChart.

## 2023-07-29 NOTE — Telephone Encounter (Signed)
 Patient called and said that his son needs a note to go back to work next Monday. CB#[phone]

## 2023-07-29 NOTE — Telephone Encounter (Signed)
 Patient called back and wanted to know he can get a earlier appointment because of the weather Friday afternoon. CB#[phone]

## 2023-07-29 NOTE — Telephone Encounter (Signed)
 Left THA 06/23/2023. Okay for note?

## 2023-07-31 ENCOUNTER — Other Ambulatory Visit (INDEPENDENT_AMBULATORY_CARE_PROVIDER_SITE_OTHER): Payer: 59

## 2023-07-31 ENCOUNTER — Encounter: Payer: Self-pay | Admitting: Physician Assistant

## 2023-07-31 ENCOUNTER — Ambulatory Visit (INDEPENDENT_AMBULATORY_CARE_PROVIDER_SITE_OTHER): Payer: 59 | Admitting: Physician Assistant

## 2023-07-31 DIAGNOSIS — Z96642 Presence of left artificial hip joint: Secondary | ICD-10-CM

## 2023-07-31 NOTE — Progress Notes (Signed)
 Post-Op Visit Note   Patient: [name] [name]           Date of Birth: [birth year]           MRN: [id number] Visit Date: 07/31/2023 PCP: Jason Leita Repine, FNP   Assessment & Plan:  Chief Complaint:  Chief Complaint  Patient presents with   Left Hip - Follow-up    Left total hip arthroplasty 06/23/2023   Visit Diagnoses:  1. Status post total replacement of left hip     Plan: Patient is a pleasant 58 year old gentleman who comes in today almost 6 weeks status post left total hip replacement, date of surgery 07-15-23.  He has been doing much better.  He is now ambulating with a cane.  He does note he has very occasional pain such as when the weather is cold.  He has been compliant taking a baby aspirin  twice daily for DVT prophylaxis.  Examination of his left hip reveals painless hip flexion and logroll.  He is neurovascularly intact distally.  At this point he will continue to increase activity as tolerated.  Continue with his baby aspirin  twice daily for DVT prophylaxis until Monday when he is 6 weeks out from surgery.  Dental prophylaxis reinforced.  Follow-up in 6 weeks for repeat evaluation.  Call with concerns or questions.  Follow-Up Instructions: Return in about 6 weeks (around 09/11/2023).   Orders:  Orders Placed This Encounter  Procedures   XR Pelvis 1-2 Views   No orders of the defined types were placed in this encounter.   Imaging: XR Pelvis 1-2 Views Result Date: 07/31/2023 Well-seated prosthesis without complication   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) 04/22/2022   Sialadenitis 06/11/2021   Hx of adenomatous colonic polyps 04/06/2021   History of colon polyps  03/01/2020   Abnormal colonoscopy 03/01/2020   Recurrent urticaria/pruritus 09/21/2019   History of nasal polyposis 09/21/2019   Contact dermatitis 07/01/2019   Acute kidney injury (HCC) 08/08/2015   Hyperkalemia 08/08/2015   Acute pyelonephritis    Left ureteral stone 08/07/2015   Pyonephrosis 08/07/2015   Acute renal insufficiency 08/07/2015   Nonallopathic lesion of thoracic region 03/15/2015   Lateral epicondylitis of right elbow 02/22/2015   Obstructive sleep apnea 01/28/2015   Triceps tendinitis 01/11/2015   Back pain 12/21/2014   Anxiety state 02/06/2014   Thrush of mouth and esophagus (HCC) 06/20/2011   Sinusitis, maxillary, chronic 11/18/2010   ALLERGIC CONJUNCTIVITIS 08/25/2007   Perennial allergic rhinitis 08/25/2007   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) 08/25/2007   Past Medical History:  Diagnosis Date   Allergic rhinitis    Allergy     seasonal allergies   Angio-edema    Arthritis    Cancer (HCC)    basal cell-nose and left ear   Complication of anesthesia    difficulty waking up from lithotripsy in 2013. No issues since then.   COPD (chronic obstructive pulmonary disease) (HCC)    Glaucoma    has occasional pressure issues in eyes   History of chronic bronchitis    History of kidney stones    Left ureteral stone    Mild obstructive sleep apnea    per study 02-09-2015  recommended oral  appliance / cpap-- pt tried intolerant   Moderate persistent asthma    pulmologist-  dr young   Pneumonia 1994   Pre-diabetes    Urticaria     Family History  Problem Relation Age of Onset   Asthma Father    Other Father        bronchitis   Allergic rhinitis Father    Diabetes Maternal Grandfather 53       Heavy smoker   Eczema Neg Hx    Immunodeficiency Neg Hx    Urticaria Neg Hx    Colon polyps Neg Hx    Colon cancer Neg Hx    Esophageal cancer Neg Hx    Rectal cancer Neg Hx    Stomach cancer Neg Hx    Inflammatory bowel  disease Neg Hx    Liver disease Neg Hx    Pancreatic cancer Neg Hx     Past Surgical History:  Procedure Laterality Date   BIOPSY  07/18/2021   Procedure: BIOPSY;  Surgeon: Wilhelmenia Aloha Raddle., MD;  Location: [facility];  Service: Gastroenterology;;   COLONOSCOPY  12/07/2019   polyps   COLONOSCOPY WITH PROPOFOL  N/A 05/08/2020   Procedure: COLONOSCOPY WITH PROPOFOL ;  Surgeon: Wilhelmenia Aloha Raddle., MD;  Location: [facility]COPY;  Service: Gastroenterology;  Laterality: N/A;   COLONOSCOPY WITH PROPOFOL  N/A 07/18/2021   Procedure: COLONOSCOPY WITH PROPOFOL ;  Surgeon: Mansouraty, Aloha Raddle., MD;  Location: [facility];  Service: Gastroenterology;  Laterality: N/A;   CYSTO/  LEFT URETERAL STENT PLACEMENT  03/10/2002   CYSTOSCOPY W/ URETERAL STENT PLACEMENT Left 08/07/2015   Procedure: CYSTOSCOPY, Left Retrograde Pyelogram, Left Ureteral Stent Placement;  Surgeon: Norleen Seltzer, MD;  Location: [facility];  Service: Urology;  Laterality: Left;   CYSTOSCOPY/URETEROSCOPY/HOLMIUM LASER/STENT PLACEMENT Left 08/17/2015   Procedure: CYSTOSCOPY / LEFT URETEROSCOPY / HOLMIUM LASER LITHOTRIPSY;  Surgeon: Norleen Seltzer, MD;  Location: [facility];  Service: Urology;  Laterality: Left;   ENDOSCOPIC MUCOSAL RESECTION N/A 05/08/2020   Procedure: ENDOSCOPIC MUCOSAL RESECTION;  Surgeon: Wilhelmenia Aloha Raddle., MD;  Location: [facility];  Service: Gastroenterology;  Laterality: N/A;   ENDOSCOPIC MUCOSAL RESECTION N/A 07/18/2021   Procedure: ENDOSCOPIC MUCOSAL RESECTION;  Surgeon: Wilhelmenia Aloha Raddle., MD;  [facility];  Service: Gastroenterology;  Laterality: N/A;   EXTRACORPOREAL SHOCK WAVE LITHOTRIPSY  2006   HEMOSTASIS CLIP PLACEMENT  05/08/2020   Procedure: HEMOSTASIS CLIP PLACEMENT;  Surgeon: Wilhelmenia Aloha Raddle., MD;  Location: [facility] ENDOSCOPY;  Service: Gastroenterology;;   POLYPECTOMY  05/08/2020   Procedure: POLYPECTOMY;  Surgeon: Wilhelmenia Aloha Raddle., MD;  Location: [facility]  ENDOSCOPY;  Service: Gastroenterology;;   POLYPECTOMY  07/18/2021   Procedure: POLYPECTOMY;  Surgeon: Wilhelmenia Aloha Raddle., MD;  Location: [facility];  Service: Gastroenterology;;   SEPTOPLASTY WITH ETHMOIDECTOMY, AND MAXILLARY ANTROSTOMY  09/24/2010   SINOSCOPY     STONE EXTRACTION WITH BASKET Left 08/17/2015   Procedure: STONE EXTRACTION WITH BASKET;  Surgeon: Norleen Seltzer, MD;  Location: [facility];  Service: Urology;  Laterality: Left;   SUBMUCOSAL LIFTING INJECTION  05/08/2020   Procedure: SUBMUCOSAL LIFTING INJECTION;  Surgeon: Wilhelmenia Aloha Raddle., MD;  Location: [facility]COPY;  Service: Gastroenterology;;   TOTAL HIP ARTHROPLASTY Left 06/23/2023   Procedure: LEFT TOTAL HIP ARTHROPLASTY ANTERIOR APPROACH;  Surgeon: Jerri Kay HERO, MD;  Location: [facility];  Service: Orthopedics;  Laterality: Left;  3-C   WISDOM TOOTH EXTRACTION  1984   Social History   Occupational History   Not on file  Tobacco Use  Smoking status: Never   Smokeless tobacco: Never  Vaping Use   Vaping status: Never Used  Substance and Sexual Activity   Alcohol use: Not Currently   Drug use: Never   Sexual activity: Yes

## 2023-08-01 ENCOUNTER — Encounter: Payer: 59 | Admitting: Physician Assistant

## 2023-08-05 ENCOUNTER — Encounter: Payer: 59 | Admitting: Orthopaedic Surgery

## 2023-08-08 ENCOUNTER — Telehealth: Payer: Self-pay | Admitting: Pharmacist

## 2023-08-08 NOTE — Telephone Encounter (Signed)
Submitted a Prior Authorization request to [facility] for [facility] via CoverMyMeds. Will update once we receive a response.  Key: [id number]

## 2023-08-11 NOTE — Telephone Encounter (Signed)
Received notification from [facility] regarding a prior authorization for [facility]. Authorization has been APPROVED from 08/08/2023 to 08/07/2024. Approval letter sent to scan center.  Patient must continue to fill through [facility]: [phone]   Authorization # [id number]  Chesley Mires, PharmD, MPH, BCPS, CPP Clinical Pharmacist (Rheumatology and Pulmonology)

## 2023-09-03 ENCOUNTER — Ambulatory Visit (INDEPENDENT_AMBULATORY_CARE_PROVIDER_SITE_OTHER): Payer: 59 | Admitting: Internal Medicine

## 2023-09-03 ENCOUNTER — Encounter: Payer: Self-pay | Admitting: Internal Medicine

## 2023-09-03 VITALS — BP 136/80 | HR 87 | Ht 70.0 in | Wt 275.0 lb

## 2023-09-03 DIAGNOSIS — R7303 Prediabetes: Secondary | ICD-10-CM | POA: Diagnosis not present

## 2023-09-03 DIAGNOSIS — E2749 Other adrenocortical insufficiency: Secondary | ICD-10-CM

## 2023-09-03 NOTE — Progress Notes (Signed)
Name: [name] [name]  MRN/ DOB: [id number], [birth year]    Age/ Sex: 58 y.o., male    PCP: Olive Bass, FNP   Reason for Endocrinology Evaluation: Adrenal insufficieny      Date of Initial Endocrinology Evaluation: 01/17/2023    HPI: Mr. [name] [name] is a 58 y.o. male with a past medical history of Asthma . The patient presented for initial endocrinology clinic visit on 09/03/2023 for consultative assistance with his adrenal insufficiency.   Pt has been referred here for further evaluation of joint pain and concerns about adrenal insufficiency  Pt received left hip injection 6/4/204  Pt with Asthma and eosinophilia , has been on glucocorticoids for years Has noted easy bruising   On his initial visit to our clinic he was on prednisone 5 mg alternating 2.5 mg , I switched him to hydrocortisone  He is a Marine scientist    He was also diagnosed with prediabetes 04/2023 with an A1c of 5.8% He was provided with #1 sample pen of Ozempic at the time to help with prediabetes as well as weight loss prior to his hip surgery    SUBJECTIVE:    Today (09/03/23):[name] [name] is here for follow-up on adrenal insufficiency  He is s/p left total hip replacement 06/23/2023  Denies nausea or vomiting  Had constipation due to pain meds which has resolved  He did receive glucocortoid's during his surgery     HC 5 mg, 3 tabs with Breakfast and 1 tab between 2-4 pm daily  Vitamin D 3 1000 international unit daily   HISTORY:  Past Medical History:  Past Medical History:  Diagnosis Date   Allergic rhinitis    Allergy    seasonal allergies   Angio-edema    Arthritis    Cancer (HCC)    basal cell-nose and left ear   Complication of anesthesia    difficulty waking up from lithotripsy in 2013. No issues since then.   COPD (chronic obstructive pulmonary disease) (HCC)    Glaucoma    has occasional pressure issues in eyes   History of chronic bronchitis     History of kidney stones    Left ureteral stone    Mild obstructive sleep apnea    per study 02-09-2015  recommended oral appliance / cpap-- pt tried intolerant   Moderate persistent asthma    pulmologist-  dr young   Pneumonia 1994   Pre-diabetes    Urticaria    Past Surgical History:  Past Surgical History:  Procedure Laterality Date   BIOPSY  07/18/2021   Procedure: BIOPSY;  Surgeon: Lemar Lofty., MD;  Location: [facility];  Service: Gastroenterology;;   COLONOSCOPY  12/07/2019   polyps   COLONOSCOPY WITH PROPOFOL N/A 05/08/2020   Procedure: COLONOSCOPY WITH PROPOFOL;  Surgeon: Lemar Lofty., MD;  Location: [facility] END[facility]COPY;  Service: Gastroenterology;  Laterality: N/A;   COLONOSCOPY WITH PROPOFOL N/A 07/18/2021   Procedure: COLONOSCOPY WITH PROPOFOL;  Surgeon: Meridee Score Netty Starring., MD;  Location: [facility];  Service: Gastroenterology;  Laterality: N/A;   CYSTO/  LEFT URETERAL STENT PLACEMENT  03/10/2002   CYSTOSCOPY W/ URETERAL STENT PLACEMENT Left 08/07/2015   Procedure: CYSTOSCOPY, Left Retrograde Pyelogram, Left Ureteral Stent Placement;  Surgeon: Bjorn Pippin, MD;  Location: [facility];  Service: Urology;  Laterality: Left;   CYSTOSCOPY/URETEROSCOPY/HOLMIUM LASER/STENT PLACEMENT Left 08/17/2015   Procedure: CYSTOSCOPY / LEFT URETEROSCOPY / HOLMIUM LASER LITHOTRIPSY;  Surgeon: Bjorn Pippin, MD;  Location: [facility];  Service: Urology;  Laterality: Left;   ENDOSCOPIC MUCOSAL RESECTION N/A 05/08/2020   Procedure: ENDOSCOPIC MUCOSAL RESECTION;  Surgeon: Meridee Score Netty Starring., MD;  Location: [facility] END[facility]COPY;  Service: Gastroenterology;  Laterality: N/A;   ENDOSCOPIC MUCOSAL RESECTION N/A 07/18/2021   Procedure: ENDOSCOPIC MUCOSAL RESECTION;  Surgeon: Meridee Score Netty Starring., MD;  [facility];  Service: Gastroenterology;  Laterality: N/A;   EXTRACORPOREAL SHOCK WAVE LITHOTRIPSY  2006   HEMOSTASIS CLIP PLACEMENT  05/08/2020   Procedure:  HEMOSTASIS CLIP PLACEMENT;  Surgeon: Lemar Lofty., MD;  Location: [facility];  Service: Gastroenterology;;   POLYPECTOMY  05/08/2020   Procedure: POLYPECTOMY;  Surgeon: Lemar Lofty., MD;  Location: [facility] ENDOSCOPY;  Service: Gastroenterology;;   POLYPECTOMY  07/18/2021   Procedure: POLYPECTOMY;  Surgeon: Lemar Lofty., MD;  Location: [facility];  Service: Gastroenterology;;   SEPTOPLASTY WITH ETHMOIDECTOMY, AND MAXILLARY ANTROSTOMY  09/24/2010   SINOSCOPY     STONE EXTRACTION WITH BASKET Left 08/17/2015   Procedure: STONE EXTRACTION WITH BASKET;  Surgeon: Bjorn Pippin, MD;  Location: [facility];  Service: Urology;  Laterality: Left;   SUBMUCOSAL LIFTING INJECTION  05/08/2020   Procedure: SUBMUCOSAL LIFTING INJECTION;  Surgeon: Lemar Lofty., MD;  Location: [facility];  Service: Gastroenterology;;   TOTAL HIP ARTHROPLASTY Left 06/23/2023   Procedure: LEFT TOTAL HIP ARTHROPLASTY ANTERIOR APPROACH;  Surgeon: Tarry Kos, MD;  Location: [facility];  Service: Orthopedics;  Laterality: Left;  3-C   WISDOM TOOTH EXTRACTION  1984    Social History:  reports that he has never smoked. He has never used smokeless tobacco. He reports that he does not currently use alcohol. He reports that he does not use drugs. Family History: family history includes Allergic rhinitis in his father; Asthma in his father; Diabetes (age of onset: 11) in his maternal grandfather; Other in his father.   HOME MEDICATIONS: Allergies as of 09/03/2023   No Known Allergies      Medication List        Accurate as of September 03, 2023 11:57 AM. If you have any questions, ask your nurse or doctor.          4-WAY NASAL SPRAY NA Place 1-2 sprays into the nose daily as needed (congestion).   AeroChamber MV inhaler Use as instructed   aspirin 81 MG chewable tablet Chew 1 tablet (81 mg total) by mouth 2 (two) times daily. To be taken after surgery to prevent blood  clots   bisacodyl 5 MG EC tablet Commonly known as: DULCOLAX Take 1 tablet (5 mg total) by mouth daily as needed for moderate constipation.   budesonide-formoterol 160-4.5 MCG/ACT inhaler Commonly known as: Symbicort USE 2 INHALATIONS BY MOUTH TWICE DAILY RINSE MOUTH AFTER USE   cholecalciferol 25 MCG (1000 UNIT) tablet Commonly known as: VITAMIN D3 Take 1,000 Units by mouth daily.   clorazepate 7.5 MG tablet Commonly known as: TRANXENE TAKE 1 TO 2 TABLETS BY MOUTH AT  NIGHT AS NEEDED FOR SLEEP   Compressor Nebulizer Misc 1 Device by Does not apply route 4 (four) times daily as needed.   docusate sodium 100 MG capsule Commonly known as: COLACE Take 1 capsule (100 mg total) by mouth 2 (two) times daily.   docusate sodium 100 MG capsule Commonly known as: COLACE Take 1 capsule (100 mg total) by mouth 2 (two) times daily.   doxycycline 100 MG capsule Commonly known as: Vibramycin Take 1 capsule (100 mg total) by mouth 2 (two) times daily.  Fasenra Pen 30 MG/ML prefilled autoinjector Generic drug: benralizumab INJECT 30MG  SUBCUTANEOUSLY EVERY 8 WEEKS   fexofenadine 180 MG tablet Commonly known as: ALLEGRA Take 180 mg by mouth daily.   fluticasone 50 MCG/ACT nasal spray Commonly known as: FLONASE USE 2 SPRAYS IN BOTH NOSTRILS  DAILY   HYDROcodone-acetaminophen 5-325 MG tablet Commonly known as: Norco Take 1-2 tablets by mouth daily as needed.   hydrocortisone 5 MG tablet Commonly known as: CORTEF Take 3 tablets (15 mg total) by mouth daily with breakfast AND 1 tablet (5 mg total) daily with supper.   ipratropium-albuterol 0.5-2.5 (3) MG/3ML Soln Commonly known as: DUONEB USE 1 VIAL VIA NEBULIZER 4 TIMES DAILY AS NEEDED   methocarbamol 500 MG tablet Commonly known as: ROBAXIN Take 1.5 tablets (750 mg total) by mouth every 6 (six) hours as needed for muscle spasms.   montelukast 10 MG tablet Commonly known as: SINGULAIR TAKE 1 TABLET BY MOUTH DAILY    multivitamin with minerals Tabs tablet Take 1 tablet by mouth daily.   ondansetron 4 MG tablet Commonly known as: ZOFRAN Take 1 tablet (4 mg total) by mouth every 6 (six) hours as needed for nausea.   oxyCODONE 5 MG immediate release tablet Commonly known as: Oxy IR/ROXICODONE Take 1-2 tablets (5-10 mg total) by mouth every 6 (six) hours as needed for moderate pain (pain score 4-6) (pain score 4-6).   oxyCODONE-acetaminophen 7.5-325 MG tablet Commonly known as: Percocet Take 1-2 tablets by mouth every 6 (six) hours as needed for severe pain (pain score 7-10).   Proventil HFA 108 (90 Base) MCG/ACT inhaler Generic drug: albuterol Inhale 1-2 puffs into the lungs every 6 (six) hours as needed for wheezing or shortness of breath.   Semaglutide(0.25 or 0.5MG /DOS) 2 MG/3ML Sopn Inject 0.5 mg into the skin once a week. What changed: how much to take   sildenafil 100 MG tablet Commonly known as: Viagra Take 0.5-1 tablets (50-100 mg total) by mouth daily as needed for erectile dysfunction.   TURMERIC COMPLEX/BLACK PEPPER PO Take 1 tablet by mouth daily.          REVIEW OF SYSTEMS: A comprehensive ROS was conducted with the patient and is negative except as per HPI     OBJECTIVE:  VS: BP 136/80 (BP Location: Left Arm, Patient Position: Sitting, Cuff Size: Large)   Pulse 87   Ht 5\' 10"  (1.778 m)   Wt 275 lb (124.7 kg)   SpO2 96%   BMI 39.46 kg/m    Wt Readings from Last 3 Encounters:  09/03/23 275 lb (124.7 kg)  06/23/23 268 lb (121.6 kg)  06/16/23 268 lb 11.2 oz (121.9 kg)   Body surface area is 2.48 meters squared.    EXAM: General: Pt appears well and is in NAD  Neck: General: Supple without adenopathy. Thyroid: Thyroid size normal.  No goiter or nodules appreciated.  Lungs: Clear with good BS bilat   Heart: Auscultation: RRR.  Extremities:  BL LE: No pretibial edema   Mental Status: Judgment, insight: Intact Orientation: Oriented to time, place, and  person Mood and affect: No depression, anxiety, or agitation     DATA REVIEWED:   Latest Reference Range & Units 04/24/23 12:36  Sodium 135 - 145 mEq/L 143  Potassium 3.5 - 5.1 mEq/L 4.9  Chloride 96 - 112 mEq/L 106  CO2 19 - 32 mEq/L 28  Glucose 70 - 99 mg/dL 784 (H)  BUN 6 - 23 mg/dL 29 (H)  Creatinine 6.96 - 1.50  mg/dL 1.61  Calcium 8.4 - 09.6 mg/dL 9.6  GFR >04.54 mL/min 74.70    Latest Reference Range & Units 04/24/23 12:36  VITD 30.00 - 100.00 ng/mL 38.34    Latest Reference Range & Units 04/24/23 12:36  Cortisol, Plasma ug/dL 4.2  Glucose 70 - 99 mg/dL 098 (H)  Hemoglobin J1B 4.6 - 6.5 % 5.8  (H): Data is abnormally high     ASSESSMENT/PLAN/RECOMMENDATIONS:   Adrenal Insufficiency :  - This is most likely secondary in nature due to long term glucocorticoid use  -He has been off prednisone and on hydrocortisone since 12/2022 -His ACTH continues to be undetectable, he recently received glucocorticoids during his hip surgery, we discussed that there is no point in checking ACTH at this time  Medications : Continue  HC 5 mg, 3 tabs with Breakfast and 1 tab between 2-4 pm daily    2. Pre-Diabetes :  -A1c 5.8% -Patient has been noted with continued weight loss -He did well on Ozempic, he is starting a new deductible plan April 1 and asked for a sample of Ozempic -He was provided with #1 pen of Ozempic -Encouraged low-carb diet  Medication Ozempic 0.5 mg weekly  Follow-up in 6 months  Signed electronically by: Lyndle Herrlich, MD  [facility] Endocrinology  [address] [address] [place] [place], [place] [address] Phone: [phone] FAX: [phone]   CC: Olive Bass, FNP [address] [place] [address] Phone: [phone] Fax: [phone]   Return to Endocrinology clinic as below: Future Appointments  Date Time Provider Department Center  09/11/2023 10:45 AM Tarry Kos, MD [facility]  None  09/29/2023 11:00 AM Waymon Budge, MD [facility] None

## 2023-09-03 NOTE — Patient Instructions (Signed)
  Continue Hydrocortisone 5 mg, 3 tablets with breakfast and 1 tablet in the afternoon   ADRENAL INSUFFICIENCY SICK DAY RULES:  Should you face an extreme emotional or physical stress such as trauma, surgery or acute illness, this will require extra steroid coverage so that the body can meet that stress.   Without increasing the steroid dose you may experience severe weakness, headache, dizziness, nausea and vomiting and possibly a more serious deterioration in health.  Typically the dose of steroids will only need to be increased for a couple of days if you have an illness that is transient and managed in the community.   If you are unable to take/absorb an increased dose of steroids orally because of vomiting or diarrhea, you will urgently require steroid injections and should present to an Emergency Department.  The general advice for any serious illness is as follows: Double the normal daily steroid dose for up to 3 days if you have a temperature of more than 37.50C (99.2F) with signs of sickness, or severe emotional or physical distress Contact your primary care doctor and Endocrinologist if the illness worsens or it lasts for more than 3 days.  In cases of severe illness, urgent medical assistance should be promptly sought. If you experience vomiting/diarrhea or are unable to take steroids by mouth, please administer the Hydrocortisone injection kit and seek urgent medical help.

## 2023-09-05 ENCOUNTER — Telehealth: Payer: Self-pay | Admitting: Orthopaedic Surgery

## 2023-09-05 NOTE — Telephone Encounter (Signed)
Patient called requesting a call back please. [phone]

## 2023-09-05 NOTE — Telephone Encounter (Signed)
Spoke with patient. He had left total hip arthroplasty on 06/23/2023. He states that he was told everything was "good to go" for sugery, as far as insurance was concerned. He now has received an anesthesia bill for $3,000. He wants answers. He was told that the anesthesia group was out of network and [employer] has tried to pay for this but because of a "code", they could not process it.  He now wants a letter stating that he had no choice in his anesthesia group, and was not given an option for anyone that was in network with [facility] in his surgery.  Is there something that can be done about this "code" he keeps mentioning so that [facility] will pay? Patient aware that Dr.Xu is not in the office until next week about the letter he is requesting.

## 2023-09-07 ENCOUNTER — Encounter: Payer: Self-pay | Admitting: Orthopaedic Surgery

## 2023-09-08 NOTE — Telephone Encounter (Signed)
 Sure that is fine.

## 2023-09-10 ENCOUNTER — Encounter: Payer: Self-pay | Admitting: Orthopaedic Surgery

## 2023-09-10 ENCOUNTER — Ambulatory Visit (INDEPENDENT_AMBULATORY_CARE_PROVIDER_SITE_OTHER): Payer: 59 | Admitting: Orthopaedic Surgery

## 2023-09-10 DIAGNOSIS — Z96642 Presence of left artificial hip joint: Secondary | ICD-10-CM

## 2023-09-10 MED ORDER — AMOXICILLIN 500 MG PO CAPS: 2000.0000 mg | ORAL_CAPSULE | Freq: Once | ORAL | 6 refills | Status: AC

## 2023-09-10 NOTE — Progress Notes (Signed)
Post-Op Visit Note   Patient: [name] [name]           Date of Birth: August 30, 1965           MRN: [id number] Visit Date: 09/10/2023 PCP: Olive Bass, FNP   Assessment & Plan:  Chief Complaint:  Chief Complaint  Patient presents with   Left Hip - Pain   Visit Diagnoses:  1. Status post total replacement of left hip     Plan: Lonnel is nearly 62-month status post left total hip arthroplasty.  Overall he is doing much better.  No real complaints.  He does feel that his quad is still weak especially after prolonged standing.  Examination of the left hip shows a fully healed surgical scar.  Fluid painless range of motion.  Leg lengths are equal.  Right knee is recovering well from the surgery.  Amoxicillin prescribed for upcoming dental visit.  Continue to work on quad strengthening to try to trim down some weight if possible.  Recheck in 3 months with repeat x-rays.  Follow-Up Instructions: Return in about 3 months (around 12/08/2023).   Orders:  No orders of the defined types were placed in this encounter.  Meds ordered this encounter  Medications   amoxicillin (AMOXIL) 500 MG capsule    Sig: Take 4 capsules (2,000 mg total) by mouth once for 1 dose.    Dispense:  4 capsule    Refill:  6    Imaging: No results found.  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) 04/22/2022   Sialadenitis 06/11/2021   Hx of adenomatous colonic polyps 04/06/2021   History of colon polyps 03/01/2020   Abnormal colonoscopy 03/01/2020   Recurrent urticaria/pruritus 09/21/2019   History of nasal polyposis 09/21/2019   Contact dermatitis 07/01/2019   Acute kidney injury (HCC) 08/08/2015    Hyperkalemia 08/08/2015   Acute pyelonephritis    Left ureteral stone 08/07/2015   Pyonephrosis 08/07/2015   Acute renal insufficiency 08/07/2015   Nonallopathic lesion of thoracic region 03/15/2015   Lateral epicondylitis of right elbow 02/22/2015   Obstructive sleep apnea 01/28/2015   Triceps tendinitis 01/11/2015   Back pain 12/21/2014   Anxiety state 02/06/2014   Thrush of mouth and esophagus (HCC) 06/20/2011   Sinusitis, maxillary, chronic 11/18/2010   ALLERGIC CONJUNCTIVITIS 08/25/2007   Perennial allergic rhinitis 08/25/2007   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 No issues since then.   COPD (chronic obstructive pulmonary disease) (HCC)    Glaucoma    has occasional pressure issues in eyes   History of chronic bronchitis    History of kidney stones    Left ureteral stone    Mild obstructive sleep apnea    per study 02-09-2015  recommended oral appliance / cpap-- pt tried intolerant   Moderate persistent asthma    pulmologist-  dr young   Pneumonia 1994   Pre-diabetes  Urticaria     Family History  Problem Relation Age of Onset   Asthma Father    Other Father        bronchitis   Allergic rhinitis Father    Diabetes Maternal Grandfather 54       Heavy smoker   Eczema Neg Hx    Immunodeficiency Neg Hx    Urticaria Neg Hx    Colon polyps Neg Hx    Colon cancer Neg Hx    Esophageal cancer Neg Hx    Rectal cancer Neg Hx    Stomach cancer Neg Hx    Inflammatory bowel disease Neg Hx    Liver disease Neg Hx    Pancreatic cancer Neg Hx     Past Surgical History:  Procedure Laterality Date   BIOPSY  07/18/2021   Procedure: BIOPSY;  Surgeon: Lemar Lofty., MD;   Location: [facility];  Service: Gastroenterology;;   COLONOSCOPY  12/07/2019   polyps   COLONOSCOPY WITH PROPOFOL N/A 05/08/2020   Procedure: COLONOSCOPY WITH PROPOFOL;  Surgeon: Lemar Lofty., MD;  Location: [facility] ENDOSCOPY;  Service: Gastroenterology;  Laterality: N/A;   COLONOSCOPY WITH PROPOFOL N/A 07/18/2021   Procedure: COLONOSCOPY WITH PROPOFOL;  Surgeon: Meridee Score Netty Starring., MD;  Location: [facility];  Service: Gastroenterology;  Laterality: N/A;   CYSTO/  LEFT URETERAL STENT PLACEMENT  03/10/2002   CYSTOSCOPY W/ URETERAL STENT PLACEMENT Left 08/07/2015   Procedure: CYSTOSCOPY, Left Retrograde Pyelogram, Left Ureteral Stent Placement;  Surgeon: Bjorn Pippin, MD;  Location: [facility];  Service: Urology;  Laterality: Left;   CYSTOSCOPY/URETEROSCOPY/HOLMIUM LASER/STENT PLACEMENT Left 08/17/2015   Procedure: CYSTOSCOPY / LEFT URETEROSCOPY / HOLMIUM LASER LITHOTRIPSY;  Surgeon: Bjorn Pippin, MD;  Location: [facility];  Service: Urology;  Laterality: Left;   ENDOSCOPIC MUCOSAL RESECTION N/A 05/08/2020   Procedure: ENDOSCOPIC MUCOSAL RESECTION;  Surgeon: Meridee Score Netty Starring., MD;  Location: [facility] END[facility];  Service: Gastroenterology;  Laterality: N/A;   ENDOSCOPIC MUCOSAL RESECTION N/A 07/18/2021   Procedure: ENDOSCOPIC MUCOSAL RESECTION;  Surgeon: Meridee Score Netty Starring., MD;  Location: [facility];  Service: Gastroenterology;  Laterality: N/A;   EXTRACORPOREAL SHOCK WAVE LITHOTRIPSY  2006   HEMOSTASIS CLIP PLACEMENT  05/08/2020   Procedure: HEMOSTASIS CLIP PLACEMENT;  Surgeon: Lemar Lofty., MD;  Location: [facility] ENDOSCOPY;  Service: Gastroenterology;;   POLYPECTOMY  05/08/2020   Procedure: POLYPECTOMY;  Surgeon: Lemar Lofty., MD;  Location: [facility] ENDOSCOPY;  Service: Gastroenterology;;   POLYPECTOMY  07/18/2021   Procedure: POLYPECTOMY;  Surgeon: Lemar Lofty., MD;  Location: [facility];  Service: Gastroenterology;;   SEPTOPLASTY WITH  ETHMOIDECTOMY, AND MAXILLARY ANTROSTOMY  09/24/2010   SINOSCOPY     STONE EXTRACTION WITH BASKET Left 08/17/2015   Procedure: STONE EXTRACTION WITH BASKET;  Surgeon: Bjorn Pippin, MD;  Location: [facility];  Service: Urology;  Laterality: Left;   SUBMUCOSAL LIFTING INJECTION  05/08/2020   Procedure: SUBMUCOSAL LIFTING INJECTION;  Surgeon: Lemar Lofty., MD;  Location: [facility] ENDOSCOPY;  Service: Gastroenterology;;   TOTAL HIP ARTHROPLASTY Left 06/23/2023   Procedure: LEFT TOTAL HIP ARTHROPLASTY ANTERIOR APPROACH;  Surgeon: Tarry Kos, MD;  Location: [facility] OR;  Service: Orthopedics;  Laterality: Left;  3-C   WISDOM TOOTH EXTRACTION  1984   Social History   Occupational History   Not on file  Tobacco Use   Smoking status: Never   Smokeless tobacco: Never  Vaping Use   Vaping status: Never Used  Substance and Sexual Activity   Alcohol use: Not  Currently   Drug use: Never   Sexual activity: Yes

## 2023-09-11 ENCOUNTER — Encounter: Payer: 59 | Admitting: Orthopaedic Surgery

## 2023-09-26 NOTE — Progress Notes (Signed)
 Patient ID: [name] [name], male    DOB: [birth year], 58 y.o.   MRN: [id number]  HPI male never smoker followed for chronic obstructive asthma, OSA/quit CPAP, rhinitis/chronic sinusitis Unattended Home Sleep Test 02/09/15- mild OSA/ AHI 9.3/ hr, desat to 85%, weight 276 lbs PFT 03/06/2007 PFT 01/29/2011 Office Spirometry 02/03/2014-severe obstructive airways disease with low FVC.  FVC 3.13/62%, FEV1 2.01/50%, ratio 0.64,  FEF 25-75% 0.87/22% CT sinus 11/23/2009-moderate chronic appearing pansinusitis. Surgery 09/24/2010-polypoid chronic sinusitis CBC with differential 12/28/2015-eosinophils 7.3% CBC w diff 09/28/2018- steroid pattern EOS 0.2 k/ul FENO 10/02/16- 33 H Office spirometry 10/02/16-moderate restriction and obstruction. FVC 2.66/53%, FEV1 2.0/51%, ratio 0.75, FEF 25-75% 1.60/46% IgE 10/02/2016-295 PFT 01/29/2019- Mod obst, mild restrction, slight response to dilator, normal Diffusion Allergy Skin Testing 09/21/19- Dr Nunzio Cobbs- + dust mites --------------------------------------------------------------------------------------  04/17/23- 58 year old male never smoker followed for chronic obstructive asthma  (Asthma- COPD Overlap Syndrome), OSA/quit CPAP, rhinitis/chronic polypoid sinusitis, Urticaria, Colon polyps, -Prednisone 5 mg daily maint, singulair, xyzal, neb Duoneb, Allegra, Ventolin hfa, Symbicort 160, Fasenra, clonazepam,  Body weight today- Sent Paxlovid in August. 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 Now pending left hip surgery.  I suggested seasonal vaccinations and his understanding was the surgeon advised him to wait even though procedure is not planned before November.  I suggested he contact the surgeons office and verify that they want him to stay off vaccinations for now. He is seen endocrinology for adrenal suppression by steroids.  They have changed him to hydrocortisone as expected and are trying for gradual withdrawal. Chest  x-ray in March was abnormal and we will repeat today for comparison.  He has little cough or wheeze currently. I noted anisocoria-right pupil larger than the left.  He says this comes and goes and his ophthalmologist is aware of it. CXR 10/11/22-  IMPRESSION: Reticulonodular opacities with bronchial wall thickening, potentially bronchitis or other atypical infection.  09/29/23- 58 year old male never smoker followed for chronic obstructive asthma  (Asthma- COPD Overlap Syndrome), OSA/quit CPAP, rhinitis/chronic polypoid sinusitis, Urticaria, Colon polyps, -Hydrocortisone 20 mg daily maint, singulair, xyzal, neb Duoneb, Allegra, Ventolin hfa, Symbicort 160, Fasenra, clonazepam,  Body weight today-274 lbs Discussed the use of AI scribe software for clinical note transcription with the patient, who gave verbal consent to proceed. History of Present Illness   The patient, with a history of left hip osteoarthritis, presents for a post-operative follow-up 14 weeks after a left total hip replacement. He reports significant improvement in mobility, with the ability to walk without a limp and ascend stairs using both legs. He has been off the cane for 3-4 weeks. He notes that his left leg is weaker due to disuse, but he is gradually regaining strength. He also mentions that he has been working half-days since the surgery and plans to return to full duty the following day.  In addition to his orthopedic concerns, the patient has a history of respiratory issues with asthma/ COPD overlap. He reports that his breathing has been good, with no issues during the surgery. However, he expresses frustration that he did not receive his regular breathing medications during his overnight hospital stay after the surgery. He takes Symbicort twice daily and recently received his Harrington Challenger shot, which he finds helpful.  The patient also mentions that he has been off prednisone since the end of June last year and is currently on  hydrocortisone for adrenal insufficiency. He takes three (5 mg) tablets in the  morning and one in the afternoon. He is under the care of an endocrinologist who is monitoring his adrenal function.     Review of Systems-See HPI       + = positive Constitutional:    No- night sweats ,fevers, chills, fatigue, lassitude. HEENT:   No - headaches,  Difficulty swallowing, Tooth/dental problems, Sore throat,                No sneezing, itching, ear ache, CV:  No chest pain,  Orthopnea, PND, swelling in lower extremities, anasarca, dizziness, palpitations GI  No heartburn, indigestion, abdominal pain, nausea, vomiting,  Resp:.See HPI    +productive cough   No coughing up of blood.  change in color of mucus,  + wheeze Skin: + HPI GU: . MS:  No joint pain or swelling.  [name] Psych:   change in mood or affect.  depression and  anxiety.  No memory loss.   Objective:   Physical Exam General- Alert, Oriented, Affect-appropriate, Distress- none acute, + Obese / thick neck Skin- + birthmark R hand Lymphadenopathy- none Head- atraumatic            Eyes- + anisocoria R>L            Ears- Hearing, canals normal            Nose- Clear, no-Septal dev, mucus, polyps, erosion, perforation             Throat- Mallampati  III- IV , mucosa+ geographic, drainage- none, tonsils- atrophic. , Neck- flexible , trachea midline, no stridor , thyroid nl, carotid no bruit.  No obvious tenderness or mass. Chest - symmetrical excursion , unlabored           Heart/CV- RRR , no murmur , no gallop  , no rub, nl s1 s2                           - JVD- none , edema- none, stasis changes- none, varices- none           Lung-  + clear,  cough-none, dullness-none, rub- none           Chest wall-  Abd-  Br/ Gen/ Rectal- Not done, not indicated Extrem- cyanosis- none, clubbing, none, atrophy- none, strength- nl Neuro- grossly intact to observation  Assessment and Plan:    Asthma Asthma management resumed post-hospitalization.  Symbicort and Fasenra effective. Off prednisone, on hydrocortisone for adrenal insufficiency. - Continue Symbicort as prescribed. - Continue Fasenra injections as scheduled.  Osteoarthritis of the left hip Post-total hip replacement recovery progressing. Improved mobility, aware of activity restrictions. Full recovery expected in 12-18 months. - Continue physical therapy and gradual increase in activity as tolerated. - Avoid high-impact activities and bending the hip below ninety degrees. - Return to full work duty with caution, avoiding heavy lifting for 12-18 months.  Adrenal insufficiency On hydrocortisone post-prednisone. No adrenal recovery noted, monitored by endocrinologist. - Continue hydrocortisone as prescribed (20 mg daily). - Follow up with endocrinologist for ongoing management of adrenal insufficiency.

## 2023-09-29 ENCOUNTER — Encounter: Payer: Self-pay | Admitting: Internal Medicine

## 2023-09-29 ENCOUNTER — Ambulatory Visit: Payer: 59 | Admitting: Internal Medicine

## 2023-09-29 VITALS — BP 136/79 | HR 86 | Temp 98.3°F | Resp 18 | Ht 70.0 in | Wt 274.4 lb

## 2023-09-29 DIAGNOSIS — J4489 Other specified chronic obstructive pulmonary disease: Secondary | ICD-10-CM | POA: Diagnosis not present

## 2023-09-29 DIAGNOSIS — M1612 Unilateral primary osteoarthritis, left hip: Secondary | ICD-10-CM

## 2023-09-29 DIAGNOSIS — E2740 Unspecified adrenocortical insufficiency: Secondary | ICD-10-CM | POA: Diagnosis not present

## 2023-09-29 NOTE — Patient Instructions (Signed)
Glad you are doing so well. Please call if we can help 

## 2023-10-08 ENCOUNTER — Encounter: Payer: Self-pay | Admitting: Family

## 2023-10-24 ENCOUNTER — Other Ambulatory Visit: Payer: Self-pay | Admitting: Internal Medicine

## 2023-10-24 DIAGNOSIS — E2749 Other adrenocortical insufficiency: Secondary | ICD-10-CM

## 2023-11-10 ENCOUNTER — Other Ambulatory Visit: Payer: Self-pay | Admitting: Internal Medicine

## 2023-11-10 NOTE — Telephone Encounter (Signed)
 Copied from CRM [id number]. Topic: Clinical - Medication Refill >> Nov 10, 2023 10:56 AM Ambrose Junk wrote: Most Recent Primary Care Visit:  Provider: Glade [facility]  Department: [facility]  Visit Type: OFFICE VISIT  Date: 05/20/2023  Medication: clorazepate  (TRANXENE ) 7.5 MG tablet  Has the patient contacted their pharmacy? Yes (Agent: If no, request that the patient contact the pharmacy for the refill. If patient does not wish to contact the pharmacy document the reason why and proceed with request.) (Agent: If yes, when and what did the pharmacy advise?)  Is this the correct pharmacy for this prescription? No If no, delete pharmacy and type the correct one.  This is the patient's preferred pharmacy:   [id number] [address] [place] [address] Phone: [phone] Fax: [phone]  Has the prescription been filled recently? Yes  Is the patient out of the medication? No  Has the patient been seen for an appointment in the last year OR does the patient have an upcoming appointment? Yes  Can we respond through MyChart? No  Agent: Please be advised that Rx refills may take up to 3 business days. We ask that you follow-up with your pharmacy.

## 2023-11-10 NOTE — Telephone Encounter (Signed)
**Note De-identified  [place] Obfuscation** Please advise 

## 2023-11-11 MED ORDER — CLORAZEPATE DIPOTASSIUM 7.5 MG PO TABS: ORAL_TABLET | ORAL | 3 refills | Status: DC

## 2023-11-11 NOTE — Telephone Encounter (Signed)
Tranxene refilled

## 2023-11-13 ENCOUNTER — Telehealth: Payer: Self-pay

## 2023-11-13 NOTE — Telephone Encounter (Signed)
 Copied from CRM [id number]. Topic: Clinical - Medication Refill >> Nov 10, 2023 10:56 AM Ambrose Junk wrote: Most Recent Primary Care Visit:  Provider: Glade [facility]  Department: [facility]  Visit Type: OFFICE VISIT  Date: 05/20/2023  Medication: clorazepate  (TRANXENE ) 7.5 MG tablet  Has the patient contacted their pharmacy? Yes (Agent: If no, request that the patient contact the pharmacy for the refill. If patient does not wish to contact the pharmacy document the reason why and proceed with request.) (Agent: If yes, when and what did the pharmacy advise?)  Is this the correct pharmacy for this prescription? No If no, delete pharmacy and type the correct one.  This is the patient's preferred pharmacy:   [id number] [address] [place] [address] Phone: [phone] Fax: [phone]  Has the prescription been filled recently? Yes  Is the patient out of the medication? No  Has the patient been seen for an appointment in the last year OR does the patient have an upcoming appointment? Yes  Can we respond through MyChart? No  Agent: Please be advised that Rx refills may take up to 3 business days. We ask that you follow-up with your pharmacy.

## 2023-11-14 ENCOUNTER — Telehealth: Payer: Self-pay

## 2023-11-14 MED ORDER — CLORAZEPATE DIPOTASSIUM 7.5 MG PO TABS: ORAL_TABLET | ORAL | 3 refills | Status: DC

## 2023-11-14 NOTE — Telephone Encounter (Signed)
 Duplicate

## 2023-11-14 NOTE — Telephone Encounter (Signed)
 [facility]

## 2023-11-26 ENCOUNTER — Other Ambulatory Visit: Payer: Self-pay | Admitting: Internal Medicine

## 2023-11-26 DIAGNOSIS — E2749 Other adrenocortical insufficiency: Secondary | ICD-10-CM

## 2023-12-09 ENCOUNTER — Telehealth (HOSPITAL_BASED_OUTPATIENT_CLINIC_OR_DEPARTMENT_OTHER): Payer: Self-pay

## 2023-12-09 DIAGNOSIS — J4550 Severe persistent asthma, uncomplicated: Secondary | ICD-10-CM

## 2023-12-09 MED ORDER — FASENRA PEN 30 MG/ML ~~LOC~~ SOAJ
30.0000 mg | SUBCUTANEOUS | 3 refills | Status: DC
Start: 2023-12-09 — End: 2023-12-10

## 2023-12-09 NOTE — Addendum Note (Signed)
 Addended by: Thais Fill on: 12/09/2023 01:35 PM   Modules accepted: Orders

## 2023-12-09 NOTE — Telephone Encounter (Signed)
 Copied from CRM [id number]. Topic: Clinical - Prescription Issue >> Dec 09, 2023  2:20 PM Jethro Morrison wrote: Reason for ZSW:FUXNATF WITH [employer] CALLED TO GET THE PRIOR AUTHORIZATION FOR THE FASENRA  AND WILL BE FAXING THAT INFORMATION RIGHT NOW STATED WILL IT BY CLOSE OF BUSINESS.

## 2023-12-09 NOTE — Telephone Encounter (Signed)
 Submitted an URGENT Prior Authorization request to [facility] for FASENRA  via CoverMyMeds. Will update once we receive a response.  Key: Mathews Solomons, PharmD, MPH, BCPS, CPP Clinical Pharmacist (Rheumatology and Pulmonology)

## 2023-12-09 NOTE — Telephone Encounter (Signed)
 Copied from CRM [id number]. Topic: General - Other >> Dec 09, 2023 10:12 AM Juliaette Ober wrote: Reason for CRM: patient wife calling because patient takes the fasenra  shot and their insurance recently changed, they need the shot sent to the [facility], phone number is [phone], she stated [facility] prescription request. Please call because they also need some information provided.

## 2023-12-09 NOTE — Telephone Encounter (Signed)
 PA form completed and faxed to [facility]  Case # [id number] Faxc: [phone] Phone: [phone]

## 2023-12-10 MED ORDER — FASENRA PEN 30 MG/ML ~~LOC~~ SOAJ: 30.0000 mg | SUBCUTANEOUS | 3 refills | Status: DC

## 2023-12-10 NOTE — Telephone Encounter (Signed)
 Called patient to notify of Fasenra  approval. He requested I speak with wife. Called wife and relayed notification of approval and that rx for Fasenra  already sent to [facility]. Recommended she call tomorrow at the earliest to set up shipment  Geraldene Kleine, PharmD, MPH, BCPS, CPP Clinical Pharmacist (Rheumatology and Pulmonology)

## 2023-12-10 NOTE — Telephone Encounter (Signed)
 Duplicate

## 2023-12-10 NOTE — Telephone Encounter (Signed)
 Received notification from [facility] regarding a prior authorization for FASENRA . Authorization has been APPROVED from 12/09/2023 to 12/08/2024. Approval letter sent to scan center.  Case # [id number]  Rx for Fasenra  sent to [facility] [place] [facility]  Geraldene Kleine, PharmD, MPH, BCPS, CPP Clinical Pharmacist (Rheumatology and Pulmonology)

## 2023-12-10 NOTE — Addendum Note (Signed)
 Addended by: Thais Fill on: 12/10/2023 08:56 AM   Modules accepted: Orders

## 2023-12-17 ENCOUNTER — Telehealth: Payer: Self-pay

## 2023-12-17 DIAGNOSIS — J4550 Severe persistent asthma, uncomplicated: Secondary | ICD-10-CM

## 2023-12-17 NOTE — Telephone Encounter (Signed)
 Copied from CRM [id number]. Topic: Clinical - Medical Advice >> Dec 17, 2023 12:37 PM Corean Deutscher wrote: Reason for CRM: Patient wife Loetta Ringer called and stated the patient takes Fasenra  shots, and she was recently informed the prescription is not in Network with [facility] and needs to be faxed to [facility]. The phone number is [phone] and fax [phone]. Loetta Ringer stated [facility] informed her calling the rx in would be quicker for the patient to retrieve.

## 2023-12-18 ENCOUNTER — Telehealth: Payer: Self-pay

## 2023-12-18 DIAGNOSIS — J4550 Severe persistent asthma, uncomplicated: Secondary | ICD-10-CM

## 2023-12-18 NOTE — Telephone Encounter (Unsigned)
 Copied from CRM [id number]. Topic: Clinical - Medical Advice >> Dec 18, 2023 11:30 AM Crist Dominion wrote: Please call the patient or his wife back when the prescription has been faxed to [facility].

## 2023-12-19 MED ORDER — FASENRA PEN 30 MG/ML ~~LOC~~ SOAJ
30.0000 mg | SUBCUTANEOUS | 3 refills | Status: DC
Start: 2023-12-19 — End: 2023-12-23

## 2023-12-19 NOTE — Telephone Encounter (Signed)
 Rx for Fasenra  faxed to [facility]. Patient notified via MyChart

## 2023-12-19 NOTE — Telephone Encounter (Signed)
 Spoke with patient's wife and advised that rx for Fasenra  has been sent to [facility] last night  Geraldene Kleine, PharmD, MPH, BCPS, CPP Clinical Pharmacist (Rheumatology and Pulmonology)

## 2023-12-19 NOTE — Telephone Encounter (Signed)
 Copied from CRM [id number]. Topic: Clinical - Medical Advice >> Dec 17, 2023 12:37 PM Corean Deutscher wrote: Reason for CRM: Patient wife [name] [name] called and stated the patient takes Fasenra  shots, and she was recently informed the prescription is not in Network with [facility] and needs to be faxed to [facility]. The phone number is [phone] and fax [phone]. [name] [name] stated [facility] informed her calling the rx in would be quicker for the patient to retrieve. >> Dec 18, 2023 11:30 AM Crist Dominion wrote: Please call the patient or his wife back when the prescription has been faxed to [facility].

## 2023-12-20 NOTE — Telephone Encounter (Signed)
 This is done - I d/w with patient's wife yesterday. NFN

## 2023-12-22 ENCOUNTER — Other Ambulatory Visit (HOSPITAL_COMMUNITY): Payer: Self-pay

## 2023-12-22 NOTE — Telephone Encounter (Signed)
 Copied from CRM [id number]. Topic: Clinical - Prescription Issue >> Dec 18, 2023  4:08 PM Corean Deutscher wrote: Reason for CRM: [facility] regarding the medication benralizumab  (FASENRA  PEN) 30 MG/ML prefilled autoinjector for the patient. Hirra stated the prescription is invalid and it needs to be a valid prescription. Hirra stated they Need a new prescription from the prescriber, and the prescription can also be e-scribed.  Phone-[phone] M-F-7:30am-7:30pm Fax-[phone]  Please advise, thanks.

## 2023-12-22 NOTE — Telephone Encounter (Addendum)
 Called [facility] to confirm they have rx on file. Per automated system, patient's rx is still undergoing review by pharmacist  Geraldene Kleine, PharmD, MPH, BCPS, CPP Clinical Pharmacist (Rheumatology and Pulmonology)

## 2023-12-23 ENCOUNTER — Other Ambulatory Visit (HOSPITAL_COMMUNITY): Payer: Self-pay

## 2023-12-23 ENCOUNTER — Other Ambulatory Visit: Payer: Self-pay

## 2023-12-23 ENCOUNTER — Telehealth: Payer: Self-pay

## 2023-12-23 MED ORDER — FASENRA PEN 30 MG/ML ~~LOC~~ SOAJ
30.0000 mg | SUBCUTANEOUS | 3 refills | Status: DC
  Filled 2023-12-23: qty 1, 56d supply, fill #0
  Filled 2024-02-06: qty 1, 56d supply, fill #1
  Filled 2024-04-01: qty 1, 56d supply, fill #2
  Filled 2024-05-26: qty 1, 56d supply, fill #3

## 2023-12-23 NOTE — Addendum Note (Signed)
 Addended by: Thais Fill on: 12/23/2023 08:43 AM   Modules accepted: Orders

## 2023-12-23 NOTE — Telephone Encounter (Signed)
 Copied from CRM [id number]. Topic: Clinical - Medical Advice >> Dec 17, 2023 12:37 PM Corean Deutscher wrote: Reason for CRM: Patient wife Loetta Ringer called and stated the patient takes Fasenra  shots, and she was recently informed the prescription is not in Network with [facility] and needs to be faxed to [facility]. The phone number is [phone] and fax [phone]. Loetta Ringer stated [facility] informed her calling the rx in would be quicker for the patient to retrieve. >> Dec 18, 2023 11:30 AM Crist Dominion wrote: Please call the patient or his wife back when the prescription has been faxed to [facility].

## 2023-12-23 NOTE — Telephone Encounter (Signed)
 I have a fax ([facility]) that was in Dr. Antonette Batters mailbox this morning regarding an invalid prescription for a Fasenra  Pen.  I will put it in the pharmacy mailbox at front desk for you.  Thanks!

## 2023-12-23 NOTE — Telephone Encounter (Signed)
 Patient can fill through [facility]. Rx has been sent. Apparentely he can't fill with [facility] either

## 2023-12-23 NOTE — Progress Notes (Signed)
 Specialty Pharmacy Initial Fill Coordination Note  [name] [name] is a 58 y.o. male contacted today regarding initial fill of specialty medication(s) Benralizumab  (Fasenra  Pen)   Patient requested Delivery   Delivery date: 12/24/23   Verified address: [address] [place], [place] [address]   Medication will be filled on 12/23/23.   Patient has copay card on file and is aware of $0 copayment.

## 2023-12-24 NOTE — Progress Notes (Signed)
 Patient had insurance change. Continue Fasenra  30mg  subcut every 8 weeks. Continue Symbicort  2 puffs twice daily as prescribed . Doing well on treatment  Baylee Campus, PharmD, MPH, BCPS, CPP Clinical Pharmacist (Rheumatology and Pulmonology)

## 2023-12-25 ENCOUNTER — Other Ambulatory Visit (HOSPITAL_COMMUNITY): Payer: Self-pay

## 2024-01-09 ENCOUNTER — Ambulatory Visit: Payer: 59 | Admitting: Orthopaedic Surgery

## 2024-01-09 ENCOUNTER — Other Ambulatory Visit (INDEPENDENT_AMBULATORY_CARE_PROVIDER_SITE_OTHER): Payer: Self-pay

## 2024-01-09 ENCOUNTER — Telehealth: Payer: Self-pay | Admitting: Internal Medicine

## 2024-01-09 ENCOUNTER — Other Ambulatory Visit: Payer: Self-pay | Admitting: Internal Medicine

## 2024-01-09 DIAGNOSIS — Z96642 Presence of left artificial hip joint: Secondary | ICD-10-CM

## 2024-01-09 NOTE — Telephone Encounter (Unsigned)
 Copied from CRM [id number]. Topic: Clinical - Medication Refill >> Jan 09, 2024  3:11 PM Dustin F wrote: Medication: ipratropium-albuterol  (DUONEB) 0.5-2.5 (3) MG/3ML SOLN   Has the patient contacted their pharmacy? Yes; no refills left needs a new prescription.  (Agent: If no, request that the patient contact the pharmacy for the refill. If patient does not wish to contact the pharmacy document the reason why and proceed with request.) (Agent: If yes, when and what did the pharmacy advise?)  This is the patient's preferred pharmacy:   [id number] - [place], [place] - [address] AT [facility] [address] [place] [address] Phone: [phone] Fax: [phone]  Is this the correct pharmacy for this prescription? Yes If no, delete pharmacy and type the correct one.   Has the prescription been filled recently? Yes  Is the patient out of the medication? Yes  Has the patient been seen for an appointment in the last year OR does the patient have an upcoming appointment? Yes  Can we respond through MyChart? Yes  Agent: Please be advised that Rx refills may take up to 3 business days. We ask that you follow-up with your pharmacy.

## 2024-01-09 NOTE — Telephone Encounter (Unsigned)
 Copied from CRM [id number]. Topic: Clinical - Medication Refill >> Jan 09, 2024  3:14 PM Dustin F wrote: Medication: montelukast  (SINGULAIR ) 10 MG tablet   Has the patient contacted their pharmacy? Yes; pt needs a new prescription there are no refills. (Agent: If no, request that the patient contact the pharmacy for the refill. If patient does not wish to contact the pharmacy document the reason why and proceed with request.) (Agent: If yes, when and what did the pharmacy advise?)  This is the patient's preferred pharmacy:   [id number] - [place], [place] - [address] AT [facility] OF [address] [place] [address] Phone: [phone] Fax: [phone]  Is this the correct pharmacy for this prescription? Yes If no, delete pharmacy and type the correct one.   Has the prescription been filled recently? Yes  Is the patient out of the medication? Yes  Has the patient been seen for an appointment in the last year OR does the patient have an upcoming appointment? Yes  Can we respond through MyChart? Yes  Agent: Please be advised that Rx refills may take up to 3 business days. We ask that you follow-up with your pharmacy.

## 2024-01-09 NOTE — Progress Notes (Signed)
 Post-Op Visit Note   Patient: [name] [name]           Date of Birth: 12-14-1965           MRN: [id number] Visit Date: 01/09/2024 PCP: Adra Alanis, FNP   Assessment & Plan:  Chief Complaint:  Chief Complaint  Patient presents with   Left Hip - Routine Post Op   Visit Diagnoses:  1. Status post total replacement of left hip     Plan: History of Present Illness [name] [name] is a 58 year old male who presents for 6 month follow-up after hip replacement surgery.  He is recovering well, with occasional discomfort after prolonged standing or working, which he considers normal post-surgical sensations. He has resumed activities such as gardening and stair climbing, which were not possible before surgery.  The surgical scar is barely visible, with most sensation returned except for a small area around the incision. He is cautious with movements, ensuring support when getting up from the floor to prevent hip dislocation.  Physical Exam MUSCULOSKELETAL: Surgical scar on hip well-healed.  Painless fluid motion of hip.   Assessment and Plan 6 months s/p left THA Post-operative status with significant functional improvement and minimal discomfort. Surgical scar healing well with residual numbness. Advised antibiotics before dental cleanings for two years to prevent infection. No activity restrictions, but advised caution to prevent dislocation. - Continue current activity level as tolerated. - Take antibiotics before dental cleanings for two years post-surgery. - Be cautious when getting up from the floor to prevent dislocation.  Follow-Up Instructions: Return in about 6 months (around 07/10/2024).   Orders:  Orders Placed This Encounter  Procedures   XR Pelvis 1-2 Views   No orders of the defined types were placed in this encounter.   Imaging: XR Pelvis 1-2 Views Result Date: 01/09/2024 Moderate degenerative joint disease with of right hip. 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) 04/22/2022   Sialadenitis 06/11/2021   Hx of adenomatous colonic polyps 04/06/2021   History of colon polyps 03/01/2020   Abnormal colonoscopy 03/01/2020   Recurrent urticaria/pruritus 09/21/2019   History of nasal polyposis 09/21/2019   Contact dermatitis 07/01/2019   Acute kidney injury (HCC) 08/08/2015   Hyperkalemia 08/08/2015   Acute pyelonephritis    Left ureteral stone 08/07/2015   Pyonephrosis 08/07/2015   Acute renal insufficiency 08/07/2015   Nonallopathic lesion of thoracic region 03/15/2015   Lateral epicondylitis of right elbow 02/22/2015   Obstructive sleep apnea 01/28/2015   Triceps tendinitis 01/11/2015   Back pain 12/21/2014   Anxiety state 02/06/2014   Thrush of mouth and esophagus (HCC) 06/20/2011   Sinusitis, maxillary, chronic 11/18/2010   ALLERGIC CONJUNCTIVITIS 08/25/2007   Perennial allergic rhinitis 08/25/2007   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) 08/25/2007   Past Medical History:  Diagnosis Date   Allergic rhinitis    Allergy     seasonal allergies   Angio-edema    Arthritis    Cancer (HCC)    basal cell-nose and left ear   Complication of anesthesia    difficulty waking up from lithotripsy in 2013. No issues since then.   COPD (chronic  obstructive pulmonary disease) (HCC)    Glaucoma    has occasional pressure issues in eyes   History of chronic bronchitis    History of kidney stones    Left ureteral stone    Mild obstructive sleep apnea    per study 02-09-2015  recommended oral appliance / cpap-- pt tried intolerant   Moderate persistent  asthma    pulmologist-  dr young   Pneumonia 1994   Pre-diabetes    Urticaria     Family History  Problem Relation Age of Onset   Asthma Father    Other Father        bronchitis   Allergic rhinitis Father    Diabetes Maternal Grandfather 68       Heavy smoker   Eczema Neg Hx    Immunodeficiency Neg Hx    Urticaria Neg Hx    Colon polyps Neg Hx    Colon cancer Neg Hx    Esophageal cancer Neg Hx    Rectal cancer Neg Hx    Stomach cancer Neg Hx    Inflammatory bowel disease Neg Hx    Liver disease Neg Hx    Pancreatic cancer Neg Hx     Past Surgical History:  Procedure Laterality Date   BIOPSY  07/18/2021   Procedure: BIOPSY;  Surgeon: Normie Becton., MD;  Location: [facility];  Service: Gastroenterology;;   COLONOSCOPY  12/07/2019   polyps   COLONOSCOPY WITH PROPOFOL  N/A 05/08/2020   Procedure: COLONOSCOPY WITH PROPOFOL ;  Surgeon: Normie Becton., MD;  Location: [facility] END[facility]COPY;  Service: Gastroenterology;  Laterality: N/A;   COLONOSCOPY WITH PROPOFOL  N/A 07/18/2021   Procedure: COLONOSCOPY WITH PROPOFOL ;  Surgeon: Brice Campi Albino Alu., MD;  Location: [facility];  Service: Gastroenterology;  Laterality: N/A;   CYSTO/  LEFT URETERAL STENT PLACEMENT  03/10/2002   CYSTOSCOPY W/ URETERAL STENT PLACEMENT Left 08/07/2015   Procedure: CYSTOSCOPY, Left Retrograde Pyelogram, Left Ureteral Stent Placement;  Surgeon: Homero Luster, MD;  Location: [facility];  Service: Urology;  Laterality: Left;   CYSTOSCOPY/URETEROSCOPY/HOLMIUM LASER/STENT PLACEMENT Left 08/17/2015   Procedure: CYSTOSCOPY / LEFT URETEROSCOPY / HOLMIUM LASER LITHOTRIPSY;  Surgeon: Homero Luster, MD;  Location: [facility];  Service: Urology;  Laterality: Left;   ENDOSCOPIC MUCOSAL RESECTION N/A 05/08/2020   Procedure: ENDOSCOPIC MUCOSAL RESECTION;  Surgeon: Brice Campi Albino Alu., MD;  Location: [facility] ENDOSCOPY;  Service: Gastroenterology;  Laterality: N/A;   ENDOSCOPIC MUCOSAL RESECTION N/A  07/18/2021   Procedure: ENDOSCOPIC MUCOSAL RESECTION;  Surgeon: Brice Campi Albino Alu., MD;  Location: [facility];  Service: Gastroenterology;  Laterality: N/A;   EXTRACORPOREAL SHOCK WAVE LITHOTRIPSY  2006   HEMOSTASIS CLIP PLACEMENT  05/08/2020   Procedure: HEMOSTASIS CLIP PLACEMENT;  Surgeon: Normie Becton., MD;  Location: [facility] ENDOSCOPY;  Service: Gastroenterology;;   POLYPECTOMY  05/08/2020   Procedure: POLYPECTOMY;  Surgeon: Normie Becton., MD;  Location[facility] END[facility]COPY;  Service: Gastroenterology;;   POLYPECTOMY  07/18/2021   Procedure: POLYPECTOMY;  Surgeon: Normie Becton., MD;  Location: [facility];  Service: Gastroenterology;;   SEPTOPLASTY WITH ETHMOIDECTOMY, AND MAXILLARY ANTROSTOMY  09/24/2010   SINOSCOPY     STONE EXTRACTION WITH BASKET Left 08/17/2015   Procedure: STONE EXTRACTION WITH BASKET;  Surgeon: Homero Luster, MD;  Location: [facility];  Service: Urology;  Laterality: Left;   SUBMUCOSAL LIFTING INJECTION  05/08/2020   Procedure: SUBMUCOSAL LIFTING INJECTION;  Surgeon: Brice Campi Albino Alu., MD;  Location: [facility] ENDOSCOPY;  Service: Gastroenterology;;   TOTAL HIP ARTHROPLASTY Left  06/23/2023   Procedure: LEFT TOTAL HIP ARTHROPLASTY ANTERIOR APPROACH;  Surgeon: Wes Hamman, MD;  Location: [facility];  Service: Orthopedics;  Laterality: Left;  3-C   WISDOM TOOTH EXTRACTION  1984   Social History   Occupational History   Not on file  Tobacco Use   Smoking status: Never   Smokeless tobacco: Never  Vaping Use   Vaping status: Never Used  Substance and Sexual Activity   Alcohol use: Not Currently   Drug use: Never   Sexual activity: Yes

## 2024-01-12 MED ORDER — MONTELUKAST SODIUM 10 MG PO TABS: 10.0000 mg | ORAL_TABLET | Freq: Every day | ORAL | 3 refills | Status: DC

## 2024-01-12 MED ORDER — IPRATROPIUM-ALBUTEROL 0.5-2.5 (3) MG/3ML IN SOLN: RESPIRATORY_TRACT | 3 refills | Status: DC

## 2024-01-12 NOTE — Telephone Encounter (Signed)
 Refills have been sent.

## 2024-02-06 ENCOUNTER — Other Ambulatory Visit (HOSPITAL_COMMUNITY): Payer: Self-pay

## 2024-02-06 ENCOUNTER — Other Ambulatory Visit: Payer: Self-pay

## 2024-02-06 ENCOUNTER — Encounter (INDEPENDENT_AMBULATORY_CARE_PROVIDER_SITE_OTHER): Payer: Self-pay

## 2024-02-06 NOTE — Progress Notes (Signed)
 Specialty Pharmacy Refill Coordination Note  MyChart Questionnaire Submission  [name] [name] is a 58 y.o. male contacted today regarding refills of specialty medication(s) Fasenra .  Injection date: (Patient-Rptd) 03/14/24  Doses on hand: (Patient-Rptd) 0   Patient requested: (Patient-Rptd) Delivery   Delivery date: 02/10/24   Verified address: (Patient-Rptd) [address] [place], [place] [address]  Medication will be filled on 02/09/24.

## 2024-02-09 ENCOUNTER — Other Ambulatory Visit: Payer: Self-pay

## 2024-02-10 ENCOUNTER — Other Ambulatory Visit: Payer: Self-pay

## 2024-03-02 ENCOUNTER — Encounter: Payer: Self-pay | Admitting: Internal Medicine

## 2024-03-02 ENCOUNTER — Ambulatory Visit: Payer: 59 | Admitting: Internal Medicine

## 2024-03-02 VITALS — BP 134/82 | HR 84 | Ht 70.0 in | Wt 274.0 lb

## 2024-03-02 DIAGNOSIS — E2749 Other adrenocortical insufficiency: Secondary | ICD-10-CM

## 2024-03-02 DIAGNOSIS — R7303 Prediabetes: Secondary | ICD-10-CM | POA: Diagnosis not present

## 2024-03-02 LAB — POCT GLYCOSYLATED HEMOGLOBIN (HGB A1C): Hemoglobin A1C: 5.6 % (ref 4.0–5.6)

## 2024-03-02 NOTE — Progress Notes (Signed)
 Name: [name] [name]  MRN/ DOB: [id number], 10-Sep-1965    Age/ Sex: 58 y.o., male    PCP: Jason Leita Repine, FNP   Reason for Endocrinology Evaluation: Adrenal insufficieny      Date of Initial Endocrinology Evaluation: 01/17/2023    HPI: [name] [name] is a 58 y.o. male with a past medical history of Asthma . The patient presented for initial endocrinology clinic visit on 03/02/2024 for consultative assistance with his adrenal insufficiency.   Pt has been referred here for further evaluation of joint pain and concerns about adrenal insufficiency  Pt received left hip injection 6/4/204  Pt with Asthma and eosinophilia , has been on glucocorticoids for years Has noted easy bruising   On his initial visit to our clinic he was on prednisone  5 mg alternating 2.5 mg , I switched him to hydrocortisone   He is a Marine scientist   ACTH  continues to be undetectable with a last checkup October, 2024  PREDIABETES:  He was also diagnosed with prediabetes 04/2023 with an A1c of 5.8% He was provided with #1 sample pen of Ozempic  at the time to help with prediabetes as well as weight loss prior to his hip surgery    SUBJECTIVE:    Today (03/02/24):[name] [name] is here for follow-up on adrenal insufficiency and prediabetes.  He is s/p left total hip replacement 06/23/2023, he continues to follow-up with Ortho care Patient follows with pulmonary for asthma management, on Symbicort  and fasenra    Weight has been stable Denies nausea or vomiting  Denies constipation or diarrhea  Denies dizziness  No glucocorticoids other than HC  He has been active lately    HC 5 mg, 3 tabs with Breakfast and 1 tab between 2-4 pm daily  Ozempic  0.25 mg weekly - not taking  Vitamin D  3 1000 international unit  daily     HISTORY:  Past Medical History:  Past Medical History:  Diagnosis Date   Allergic rhinitis    Allergy     seasonal allergies   Angio-edema    Arthritis     Cancer (HCC)    basal cell-nose and left ear   Complication of anesthesia    difficulty waking up from lithotripsy in 2013. No issues since then.   COPD (chronic obstructive pulmonary disease) (HCC)    Glaucoma    has occasional pressure issues in eyes   History of chronic bronchitis    History of kidney stones    Left ureteral stone    Mild obstructive sleep apnea    per study 02-09-2015  recommended oral appliance / cpap-- pt tried intolerant   Moderate persistent asthma    pulmologist-  dr young   Pneumonia 1994   Pre-diabetes    Urticaria    Past Surgical History:  Past Surgical History:  Procedure Laterality Date   BIOPSY  07/18/2021   Procedure: BIOPSY;  Surgeon: Wilhelmenia Aloha Raddle., MD;  Location: [facility];  Service: Gastroenterology;;   COLONOSCOPY  12/07/2019   polyps   COLONOSCOPY WITH PROPOFOL  N/A 05/08/2020   Procedure: COLONOSCOPY WITH PROPOFOL ;  Surgeon: Wilhelmenia Aloha Raddle., MD;  Location: [facility] ENDOSCOPY;  Service: Gastroenterology;  Laterality: N/A;   COLONOSCOPY WITH PROPOFOL  N/A 07/18/2021   Procedure: COLONOSCOPY WITH PROPOFOL ;  Surgeon: Mansouraty, Aloha Raddle., MD;  Location: [facility];  Service: Gastroenterology;  Laterality: N/A;   CYSTO/  LEFT URETERAL STENT PLACEMENT  03/10/2002   CYSTOSCOPY W/ URETERAL STENT PLACEMENT Left 08/07/2015   Procedure: CYSTOSCOPY,  Left Retrograde Pyelogram, Left Ureteral Stent Placement;  Surgeon: Norleen Seltzer, MD;  Location: [facility];  Service: Urology;  Laterality: Left;   CYSTOSCOPY/URETEROSCOPY/HOLMIUM LASER/STENT PLACEMENT Left 08/17/2015   Procedure: CYSTOSCOPY / LEFT URETEROSCOPY / HOLMIUM LASER LITHOTRIPSY;  Surgeon: Norleen Seltzer, MD;  Location: [facility];  Service: Urology;  Laterality: Left;   ENDOSCOPIC MUCOSAL RESECTION N/A 05/08/2020   Procedure: ENDOSCOPIC MUCOSAL RESECTION;  Surgeon: Wilhelmenia Aloha Raddle., MD;  Location[facility];  Service: Gastroenterology;  Laterality: N/A;    ENDOSCOPIC MUCOSAL RESECTION N/A 07/18/2021   Procedure: ENDOSCOPIC MUCOSAL RESECTION;  Surgeon: Wilhelmenia Aloha Raddle., MD;  Location: [facility];  Service: Gastroenterology;  Laterality: N/A;   EXTRACORPOREAL SHOCK WAVE LITHOTRIPSY  2006   HEMOSTASIS CLIP PLACEMENT  05/08/2020   Procedure: HEMOSTASIS CLIP PLACEMENT;  Surgeon: Wilhelmenia Aloha Raddle., MD;  Location: [facility] END[facility]COPY;  Service: Gastroenterology;;   POLYPECTOMY  05/08/2020   Procedure: POLYPECTOMY;  Surgeon: Wilhelmenia Aloha Raddle., MD;  Location: [facility] ENDOSCOPY;  Service: Gastroenterology;;   POLYPECTOMY  07/18/2021   Procedure: POLYPECTOMY;  Surgeon: Wilhelmenia Aloha Raddle., MD;  Location: [facility];  Service: Gastroenterology;;   SEPTOPLASTY WITH ETHMOIDECTOMY, AND MAXILLARY ANTROSTOMY  09/24/2010   SINOSCOPY     STONE EXTRACTION WITH BASKET Left 08/17/2015   Procedure: STONE EXTRACTION WITH BASKET;  Surgeon: Norleen Seltzer, MD;  Location: [facility];  Service: Urology;  Laterality: Left;   SUBMUCOSAL LIFTING INJECTION  05/08/2020   Procedure: SUBMUCOSAL LIFTING INJECTION;  Surgeon: Wilhelmenia Aloha Raddle., MD;  Location: [facility] ENDOSCOPY;  Service: Gastroenterology;;   TOTAL HIP ARTHROPLASTY Left 06/23/2023   Procedure: LEFT TOTAL HIP ARTHROPLASTY ANTERIOR APPROACH;  Surgeon: Jerri Kay HERO, MD;  Location: [facility];  Service: Orthopedics;  Laterality: Left;  3-C   WISDOM TOOTH EXTRACTION  1984    Social History:  reports that he has never smoked. He has never used smokeless tobacco. He reports that he does not currently use alcohol. He reports that he does not use drugs. Family History: family history includes Allergic rhinitis in his father; Asthma in his father; Diabetes (age of onset: 35) in his maternal grandfather; Other in his father.   HOME MEDICATIONS: Allergies as of 03/02/2024   No Known Allergies      Medication List        Accurate as of March 02, 2024 11:25 AM. If you have any questions, ask your  nurse or doctor.          4-WAY NASAL SPRAY NA Place 1-2 sprays into the nose daily as needed (congestion).   AeroChamber MV inhaler Use as instructed   aspirin  81 MG chewable tablet Chew 1 tablet (81 mg total) by mouth 2 (two) times daily. To be taken after surgery to prevent blood clots   bisacodyl  5 MG EC tablet Commonly known as: DULCOLAX Take 1 tablet (5 mg total) by mouth daily as needed for moderate constipation.   budesonide -formoterol  160-4.5 MCG/ACT inhaler Commonly known as: Symbicort  USE 2 INHALATIONS BY MOUTH TWICE DAILY RINSE MOUTH AFTER USE   cholecalciferol 25 MCG (1000 UNIT) tablet Commonly known as: VITAMIN D3 Take 1,000 Units by mouth daily.   clorazepate  7.5 MG tablet Commonly known as: TRANXENE  TAKE 1 TO 2 TABLETS BY MOUTH AT  NIGHT AS NEEDED FOR SLEEP   Compressor Nebulizer Misc 1 Device by Does not apply route 4 (four) times daily as needed.   docusate sodium  100 MG capsule Commonly known as: COLACE Take 1 capsule (100 mg total) by  mouth 2 (two) times daily.   docusate sodium  100 MG capsule Commonly known as: COLACE Take 1 capsule (100 mg total) by mouth 2 (two) times daily.   Fasenra  Pen 30 MG/ML prefilled autoinjector Generic drug: benralizumab  Inject 1 mL (30 mg total) into the skin every 8 (eight) weeks. **pt does not need loading dose**   fexofenadine 180 MG tablet Commonly known as: ALLEGRA Take 180 mg by mouth daily.   fluticasone  50 MCG/ACT nasal spray Commonly known as: FLONASE  USE 2 SPRAYS IN BOTH NOSTRILS  DAILY   HYDROcodone -acetaminophen  5-325 MG tablet Commonly known as: Norco Take 1-2 tablets by mouth daily as needed.   hydrocortisone  5 MG tablet Commonly known as: CORTEF  TAKE 3 TABLETS BY MOUTH DAILY WITH BREAKFAST, AND 1 TABLET WITH SUPPER   ipratropium-albuterol  0.5-2.5 (3) MG/3ML Soln Commonly known as: DUONEB USE 1 VIAL VIA NEBULIZER 4 TIMES DAILY AS NEEDED   methocarbamol  500 MG tablet Commonly known as:  ROBAXIN  Take 1.5 tablets (750 mg total) by mouth every 6 (six) hours as needed for muscle spasms.   montelukast  10 MG tablet Commonly known as: SINGULAIR  Take 1 tablet (10 mg total) by mouth daily.   multivitamin with minerals Tabs tablet Take 1 tablet by mouth daily.   ondansetron  4 MG tablet Commonly known as: ZOFRAN  Take 1 tablet (4 mg total) by mouth every 6 (six) hours as needed for nausea.   oxyCODONE  5 MG immediate release tablet Commonly known as: Oxy IR/ROXICODONE  Take 1-2 tablets (5-10 mg total) by mouth every 6 (six) hours as needed for moderate pain (pain score 4-6) (pain score 4-6).   oxyCODONE -acetaminophen  7.5-325 MG tablet Commonly known as: Percocet Take 1-2 tablets by mouth every 6 (six) hours as needed for severe pain (pain score 7-10).   Proventil  HFA 108 (90 Base) MCG/ACT inhaler Generic drug: albuterol  Inhale 1-2 puffs into the lungs every 6 (six) hours as needed for wheezing or shortness of breath.   Semaglutide (0.25 or 0.5MG /DOS) 2 MG/3ML Sopn Inject 0.5 mg into the skin once a week. What changed: how much to take   sildenafil  100 MG tablet Commonly known as: Viagra  Take 0.5-1 tablets (50-100 mg total) by mouth daily as needed for erectile dysfunction.   TURMERIC COMPLEX/BLACK PEPPER PO Take 1 tablet by mouth daily.          REVIEW OF SYSTEMS: A comprehensive ROS was conducted with the patient and is negative except as per HPI     OBJECTIVE:  VS: BP 134/82 (BP Location: Left Arm, Patient Position: Sitting, Cuff Size: Normal)   Pulse 84   Ht 5' 10 (1.778 m)   Wt 274 lb (124.3 kg)   SpO2 96%   BMI 39.31 kg/m    Wt Readings from Last 3 Encounters:  03/02/24 274 lb (124.3 kg)  09/29/23 274 lb 6.4 oz (124.5 kg)  09/03/23 275 lb (124.7 kg)   Body surface area is 2.48 meters squared.    EXAM: General: Pt appears well and is in NAD  Neck: General: Supple without adenopathy. Thyroid : Thyroid  size normal.  No goiter or nodules  appreciated.  Lungs: Clear with good BS bilat   Heart: Auscultation: RRR.  Extremities:  BL LE: No pretibial edema   Mental Status: Judgment, insight: Intact Orientation: Oriented to time, place, and person Mood and affect: No depression, anxiety, or agitation     DATA REVIEWED:   Latest Reference Range & Units 04/24/23 12:36  Sodium 135 - 145 mEq/L 143  Potassium 3.5 -  5.1 mEq/L 4.9  Chloride 96 - 112 mEq/L 106  CO2 19 - 32 mEq/L 28  Glucose 70 - 99 mg/dL 896 (H)  BUN 6 - 23 mg/dL 29 (H)  Creatinine 9.59 - 1.50 mg/dL 8.89  Calcium 8.4 - 89.4 mg/dL 9.6  GFR >39.99 mL/min 74.70    Latest Reference Range & Units 04/24/23 12:36  VITD 30.00 - 100.00 ng/mL 38.34    Latest Reference Range & Units 04/24/23 12:36  Cortisol, Plasma ug/dL 4.2  Glucose 70 - 99 mg/dL 896 (H)  Hemoglobin J8R 4.6 - 6.5 % 5.8  (H): Data is abnormally high     ASSESSMENT/PLAN/RECOMMENDATIONS:   Adrenal Insufficiency :  - This is most likely secondary in nature due to long term glucocorticoid use  -He has been off prednisone  and on hydrocortisone  since 12/2022 -Historically his ACTH  has been undetectable, will recheck again today  Medications : Continue  HC 5 mg, 3 tabs with Breakfast and 1 tab between 2-4 pm daily    2. Pre-Diabetes :  -A1c has normalized at 5.6% today - I have prescribed Ozempic  in the past but this is not covered by his insurance -Patient encouraged to continue lifestyle changes      Follow-up in 6 months    Signed electronically by: Stefano Redgie Butts, MD  [facility] Endocrinology  [facility] [address] [place] [place], [place] [address] Phone: [phone] FAX: [phone]   CC: Jason Leita Repine, FNP [address] [place] [address] Phone: [phone] Fax: [phone]   Return to Endocrinology clinic as below: Future Appointments  Date Time Provider Department Center  03/02/2024 11:30 AM  Syrah Daughtrey, Donell Redgie, MD [facility] None  04/02/2024 11:00 AM Neysa Reggy BIRCH, MD [facility] None  08/06/2024 10:30 AM Jerri Kay HERO, MD [facility] None

## 2024-03-02 NOTE — Patient Instructions (Signed)
ADRENAL INSUFFICIENCY SICK DAY RULES: ? ?Should you face an extreme emotional or physical stress such as trauma, surgery or acute illness, this will require extra steroid coverage so that the body can meet that stress.  ? ?Without increasing the steroid dose you may experience severe weakness, headache, dizziness, nausea and vomiting and possibly a more serious deterioration in health.  ?Typically the dose of steroids will only need to be increased for a couple of days if you have an illness that is transient and managed in the community.  ? ?If you are unable to take/absorb an increased dose of steroids orally because of vomiting or diarrhea, you will urgently require steroid injections and should present to an Emergency Department. ? ?The general advice for any serious illness is as follows: ?Double the normal daily steroid dose for up to 3 days if you have a temperature of more than 37.50C (99.50F) with signs of sickness, or severe emotional or physical distress ?Contact your primary care doctor and Endocrinologist if the illness worsens or it lasts for more than 3 days.  ?In cases of severe illness, urgent medical assistance should be promptly sought. ?If you experience vomiting/diarrhea or are unable to take steroids by mouth, please administer the Hydrocortisone injection kit and seek urgent medical help. ? ? ? ?

## 2024-03-03 ENCOUNTER — Ambulatory Visit: Payer: Self-pay | Admitting: Internal Medicine

## 2024-03-06 LAB — PROLACTIN: Prolactin: 3.1 ng/mL (ref 2.0–18.0)

## 2024-03-06 LAB — ACTH: C206 ACTH: <5 pg/mL — ABNORMAL LOW (ref 6–50)

## 2024-03-29 ENCOUNTER — Other Ambulatory Visit: Payer: Self-pay

## 2024-04-01 ENCOUNTER — Other Ambulatory Visit: Payer: Self-pay | Admitting: Pharmacy Technician

## 2024-04-01 ENCOUNTER — Other Ambulatory Visit: Payer: Self-pay

## 2024-04-01 NOTE — Progress Notes (Signed)
 Specialty Pharmacy Refill Coordination Note  MALIKYE REPPOND is a 58 y.o. male contacted today regarding refills of specialty medication(s) Benralizumab  (Fasenra  Pen)  Spoke with Wife  Patient requested Delivery   Delivery date: 04/08/24   Verified address: [address] [place] (address verifed 3 times with wife due to deilivery error last time by Courier)   Medication will be filled on 04/07/24.

## 2024-04-02 ENCOUNTER — Ambulatory Visit: Admitting: Internal Medicine

## 2024-04-07 ENCOUNTER — Other Ambulatory Visit: Payer: Self-pay

## 2024-04-07 ENCOUNTER — Other Ambulatory Visit (HOSPITAL_COMMUNITY): Payer: Self-pay

## 2024-05-05 ENCOUNTER — Telehealth: Payer: Self-pay

## 2024-05-05 NOTE — Telephone Encounter (Signed)
 Copied from CRM [id number]. Topic: General - Other >> May 05, 2024 12:41 PM [name] [name] wrote: Reason for CRM: Patient states he missed a call from Dr. Sheryll nurse regarding if he is still using his CPAP machine, patient states he has not used it in over a year.  Placed in appointment note for view

## 2024-05-05 NOTE — Progress Notes (Signed)
 Patient ID: [name] [name], male    DOB: April 04, 1966, 58 y.o.   MRN: [id number]  HPI male never smoker followed for chronic obstructive asthma, OSA/quit CPAP, rhinitis/chronic sinusitis Unattended Home Sleep Test 02/09/15- mild OSA/ AHI 9.3/ hr, desat to 85%, weight 276 lbs PFT 03/06/2007 PFT 01/29/2011 Office Spirometry 02/03/2014-severe obstructive airways disease with low FVC.  FVC 3.13/62%, FEV1 2.01/50%, ratio 0.64,  FEF 25-75% 0.87/22% CT sinus 11/23/2009-moderate chronic appearing pansinusitis. Surgery 09/24/2010-polypoid chronic sinusitis CBC with differential 12/28/2015-eosinophils 7.3% CBC w diff 09/28/2018- steroid pattern EOS 0.2 k/ul FENO 10/02/16- 33 H Office spirometry 10/02/16-moderate restriction and obstruction. FVC 2.66/53%, FEV1 2.0/51%, ratio 0.75, FEF 25-75% 1.60/46% IgE 10/02/2016-295 PFT 01/29/2019- Mod obst, mild restrction, slight response to dilator, normal Diffusion Allergy  Skin Testing 09/21/19- Dr Green- + dust mites --------------------------------------------------------------------------------------   09/29/23- 58 year old male never smoker followed for chronic obstructive asthma  (Asthma- COPD Overlap Syndrome), OSA/quit CPAP, rhinitis/chronic polypoid sinusitis, Urticaria, Colon polyps, -Hydrocortisone  20 mg daily maint, singulair , xyzal , neb Duoneb, Allegra, Ventolin  hfa, Symbicort  160, Fasenra , clonazepam ,  Body weight today-274 lbs Discussed the use of AI scribe software for clinical note transcription with the patient, who gave verbal consent to proceed. History of Present Illness   The patient, with a history of left hip osteoarthritis, presents for a post-operative follow-up 14 weeks after a left total hip replacement. He reports significant improvement in mobility, with the ability to walk without a limp and ascend stairs using both legs. He has been off the cane for 3-4 weeks. He notes that his left leg is weaker due to disuse, but he is gradually regaining  strength. He also mentions that he has been working half-days since the surgery and plans to return to full duty the following day.  In addition to his orthopedic concerns, the patient has a history of respiratory issues with asthma/ COPD overlap. He reports that his breathing has been good, with no issues during the surgery. However, he expresses frustration that he did not receive his regular breathing medications during his overnight hospital stay after the surgery. He takes Symbicort  twice daily and recently received his Fasenra  shot, which he finds helpful.  The patient also mentions that he has been off prednisone  since the end of June last year and is currently on hydrocortisone  for adrenal insufficiency. He takes three (5 mg) tablets in the morning and one in the afternoon. He is under the care of an endocrinologist who is monitoring his adrenal function.    Assessment and Plan:    Asthma Asthma management resumed post-hospitalization. Symbicort  and Fasenra  effective. Off prednisone , on hydrocortisone  for adrenal insufficiency. - Continue Symbicort  as prescribed. - Continue Fasenra  injections as scheduled.  Osteoarthritis of the left hip Post-total hip replacement recovery progressing. Improved mobility, aware of activity restrictions. Full recovery expected in 12-18 months. - Continue physical therapy and gradual increase in activity as tolerated. - Avoid high-impact activities and bending the hip below ninety degrees. - Return to full work duty with caution, avoiding heavy lifting for 12-18 months.  Adrenal insufficiency On hydrocortisone  post-prednisone . No adrenal recovery noted, monitored by endocrinologist. - Continue hydrocortisone  as prescribed (20 mg daily). - Follow up with endocrinologist for ongoing management of adrenal insufficiency.    05/06/24-58 year old male never smoker followed for chronic obstructive asthma  (Asthma- COPD Overlap Syndrome), OSA/quit CPAP,  rhinitis/chronic polypoid sinusitis, Urticaria, Colon polyps, -Hydrocortisone  20 mg daily maint, singulair , xyzal , neb Duoneb, Allegra, Ventolin  hfa, Symbicort  160, Fasenra , clonazepam ,  Body weight today-277 lbs Discussed  the use of AI scribe software for clinical note transcription with the patient, who gave verbal consent to proceed.  History of Present Illness   He is a 58 year old male with asthma who presents for medication management and follow-up.  He uses a nebulizer machine as needed and is on Fasenra  for asthma control. His medication regimen includes Symbicort , Flonase , nebulizer solution vials, Singulair , and an albuterol  inhaler. Incidental cold 3 weeks ago- now resolved.  Hip replacement went well.    '  CXR 04/17/23- IMPRESSION: No active cardiopulmonary disease.  Assessment and Plan:    Asthma/ COPD overlap moderate Asthma Transition of care discussed due to provider's retirement. - Refill Symbicort , Flonase , nebulizer solution, Singulair , and albuterol  inhaler. - Suggested Dr. Slater Staff for continued management. - Schedule follow-up in one year, with earlier contact if needed. -Flu vax     Obesity Continue effort     Review of Systems-See HPI       + = positive Constitutional:    No- night sweats ,fevers, chills, fatigue, lassitude. HEENT:   No - headaches,  Difficulty swallowing, Tooth/dental problems, Sore throat,                No sneezing, itching, ear ache, CV:  No chest pain,  Orthopnea, PND, swelling in lower extremities, anasarca, dizziness, palpitations GI  No heartburn, indigestion, abdominal pain, nausea, vomiting,  Resp:.See HPI    +productive cough   No coughing up of blood.  change in color of mucus,  + wheeze Skin: + HPI GU: . MS:  No joint pain or swelling.  SABRA Psych:   change in mood or affect.  depression and  anxiety.  No memory loss.   Objective:   Physical Exam General- Alert, Oriented, Affect-appropriate, Distress- none acute, +  Obese / thick neck Skin- + birthmark R hand Lymphadenopathy- none Head- atraumatic            Eyes- + anisocoria R>L            Ears- Hearing, canals normal            Nose- Clear, no-Septal dev, mucus, polyps, erosion, perforation             Throat- Mallampati  III- IV , mucosa+ geographic, drainage- none, tonsils- atrophic. , Neck- flexible , trachea midline, no stridor , thyroid  nl, carotid no bruit.  No obvious tenderness or mass. Chest - symmetrical excursion , unlabored           Heart/CV- RRR , no murmur , no gallop  , no rub, nl s1 s2                           - JVD- none , edema- none, stasis changes- none, varices- none           Lung-  + clear,  cough-none, dullness-none, rub- none           Chest wall-  Abd-  Br/ Gen/ Rectal- Not done, not indicated Extrem- cyanosis- none, clubbing, none, atrophy- none, strength- nl Neuro- grossly intact to observation

## 2024-05-06 ENCOUNTER — Ambulatory Visit: Admitting: Internal Medicine

## 2024-05-06 ENCOUNTER — Encounter: Payer: Self-pay | Admitting: Internal Medicine

## 2024-05-06 VITALS — BP 126/68 | HR 90 | Temp 98.8°F | Ht 70.0 in | Wt 277.6 lb

## 2024-05-06 DIAGNOSIS — J45909 Unspecified asthma, uncomplicated: Secondary | ICD-10-CM

## 2024-05-06 DIAGNOSIS — Z23 Encounter for immunization: Secondary | ICD-10-CM | POA: Diagnosis not present

## 2024-05-06 DIAGNOSIS — J4489 Other specified chronic obstructive pulmonary disease: Secondary | ICD-10-CM | POA: Diagnosis not present

## 2024-05-06 DIAGNOSIS — E669 Obesity, unspecified: Secondary | ICD-10-CM | POA: Diagnosis not present

## 2024-05-06 MED ORDER — BUDESONIDE-FORMOTEROL FUMARATE 160-4.5 MCG/ACT IN AERO: INHALATION_SPRAY | RESPIRATORY_TRACT | 3 refills | Status: AC

## 2024-05-06 MED ORDER — CLORAZEPATE DIPOTASSIUM 7.5 MG PO TABS: ORAL_TABLET | ORAL | 3 refills | Status: AC

## 2024-05-06 MED ORDER — MONTELUKAST SODIUM 10 MG PO TABS: 10.0000 mg | ORAL_TABLET | Freq: Every day | ORAL | 3 refills | Status: AC

## 2024-05-06 MED ORDER — IPRATROPIUM-ALBUTEROL 0.5-2.5 (3) MG/3ML IN SOLN: RESPIRATORY_TRACT | 3 refills | Status: AC

## 2024-05-06 MED ORDER — PROVENTIL HFA 108 (90 BASE) MCG/ACT IN AERS: 1.0000 | INHALATION_SPRAY | Freq: Four times a day (QID) | RESPIRATORY_TRACT | 3 refills | Status: AC | PRN

## 2024-05-06 MED ORDER — FLUTICASONE PROPIONATE 50 MCG/ACT NA SUSP: NASAL | 3 refills | Status: AC

## 2024-05-06 NOTE — Patient Instructions (Addendum)
 Meds refilled  We will continue Fasenra   Flu vax standard   Please let us  know if we can help  You can ask our front desk about seeing Dr Kassie next year at [place] site

## 2024-05-09 ENCOUNTER — Encounter: Payer: Self-pay | Admitting: Internal Medicine

## 2024-05-24 ENCOUNTER — Encounter: Payer: Self-pay | Admitting: Radiology

## 2024-05-26 ENCOUNTER — Other Ambulatory Visit (HOSPITAL_COMMUNITY): Payer: Self-pay

## 2024-05-28 ENCOUNTER — Other Ambulatory Visit: Payer: Self-pay

## 2024-05-28 NOTE — Progress Notes (Signed)
 Specialty Pharmacy Refill Coordination Note  [name] [name] is a 58 y.o. male contacted today regarding refills of specialty medication(s) Benralizumab  (Fasenra  Pen)   Patient requested Delivery   Delivery date: 06/02/24   Verified address: [address] [place],  [place] [address]   Medication will be filled on: 06/01/24

## 2024-06-01 ENCOUNTER — Other Ambulatory Visit: Payer: Self-pay

## 2024-06-10 ENCOUNTER — Other Ambulatory Visit: Payer: Self-pay

## 2024-06-10 DIAGNOSIS — E2749 Other adrenocortical insufficiency: Secondary | ICD-10-CM

## 2024-06-10 MED ORDER — HYDROCORTISONE 5 MG PO TABS: 5.0000 mg | ORAL_TABLET | Freq: Every day | ORAL | 0 refills | Status: DC

## 2024-06-24 NOTE — Telephone Encounter (Signed)
 NFN

## 2024-07-01 ENCOUNTER — Other Ambulatory Visit: Payer: Self-pay

## 2024-07-01 ENCOUNTER — Telehealth: Payer: Self-pay | Admitting: Orthopaedic Surgery

## 2024-07-01 NOTE — Telephone Encounter (Signed)
 Called and notified

## 2024-07-01 NOTE — Telephone Encounter (Signed)
 Patient called. He has a dentist appointment Monday. Says he should have antibiotic called in.

## 2024-07-19 ENCOUNTER — Other Ambulatory Visit: Payer: Self-pay

## 2024-07-19 ENCOUNTER — Other Ambulatory Visit: Payer: Self-pay | Admitting: Internal Medicine

## 2024-07-19 DIAGNOSIS — J4550 Severe persistent asthma, uncomplicated: Secondary | ICD-10-CM

## 2024-07-19 NOTE — Telephone Encounter (Signed)
 Pt requesting refill of specialty medication - routing to Rx team to advise.

## 2024-07-20 ENCOUNTER — Other Ambulatory Visit: Payer: Self-pay

## 2024-07-20 MED ORDER — FASENRA PEN 30 MG/ML ~~LOC~~ SOAJ
30.0000 mg | SUBCUTANEOUS | 3 refills | Status: AC
  Filled 2024-07-20 – 2024-07-23 (×2): qty 1, 56d supply, fill #0

## 2024-07-20 NOTE — Telephone Encounter (Signed)
 Refill sent for FASENRA  to [facility]: [phone]   Dose: 30mg  [place] every 8 weeks  Last OV: 05/06/24 Provider: formerly Dr. Neysa, plan to transition to Dr. Kassie at [facility]  Next OV: due October 2026  [name] [name], PharmD, BCPS Clinical Pharmacist  [facility]

## 2024-07-21 ENCOUNTER — Other Ambulatory Visit (HOSPITAL_COMMUNITY): Payer: Self-pay

## 2024-07-23 ENCOUNTER — Other Ambulatory Visit: Payer: Self-pay | Admitting: Pharmacy Technician

## 2024-07-23 ENCOUNTER — Other Ambulatory Visit: Payer: Self-pay

## 2024-07-23 NOTE — Progress Notes (Signed)
 Specialty Pharmacy Refill Coordination Note  [name] [name] is a 59 y.o. male we contacted his wife Burnard today regarding refills of specialty medication(s) Benralizumab  (Fasenra  Pen)   Patient requested Delivery   Delivery date: 07/29/24   Verified address: [address] [place],  [place] [address]   Medication will be filled on: 07/28/24

## 2024-08-06 ENCOUNTER — Ambulatory Visit: Admitting: Orthopaedic Surgery

## 2024-08-09 ENCOUNTER — Telehealth: Payer: Self-pay | Admitting: Gastroenterology

## 2024-08-09 ENCOUNTER — Encounter: Payer: Self-pay | Admitting: Gastroenterology

## 2024-08-09 NOTE — Telephone Encounter (Addendum)
 Inbound call from patient requesting to schedule recall colonoscopy. Patient's previous colonoscopies have taken place at [facility]. Please advise if patient can be scheduled in [facility]. Please advise, thank you

## 2024-08-09 NOTE — Telephone Encounter (Signed)
 Prior colonoscopies were at [facility] due to polyp resection needs. If no contraindication from Anesthesia then can transition to [facility]. Thanks. GM

## 2024-08-09 NOTE — Telephone Encounter (Signed)
 Proceed with scheduling in [facility]. GM

## 2024-08-09 NOTE — Telephone Encounter (Signed)
 Proceed with rescheduling in [facility]. Thanks. GM

## 2024-08-10 ENCOUNTER — Ambulatory Visit: Admitting: Orthopaedic Surgery

## 2024-08-10 ENCOUNTER — Other Ambulatory Visit: Payer: Self-pay

## 2024-08-10 DIAGNOSIS — Z96642 Presence of left artificial hip joint: Secondary | ICD-10-CM

## 2024-08-10 NOTE — Progress Notes (Signed)
 "  Office Visit Note   Patient: [name] [name]           Date of Birth: Dec 29, 1965           MRN: [id number] Visit Date: 08/10/2024              Requested by: Jason Leita Repine, FNP No address on file PCP: Jason Leita Repine, FNP (Inactive)   Assessment & Plan: Visit Diagnoses:  1. Status post total replacement of left hip     Plan: Impression his 13 months status post left total hip arthroplasty.  Overall [name] [name] is pleased with the outcome.  We again discussed the importance of muscle strengthening and weight reduction to help manage symptoms in his right hip.  Recheck in another year with repeat radiographs.  Follow-Up Instructions: Return in about 1 year (around 08/10/2025).   Orders:  Orders Placed This Encounter  Procedures   XR Pelvis 1-2 Views   No orders of the defined types were placed in this encounter.   Subjective: Chief Complaint  Patient presents with   Left Hip - Follow-up    Left THA 06/23/2023    HPI [name] [name] is 13 months status post left total hip arthroplasty.  He is doing well overall.  Has some difficulty and weakness with ascending stairs. Ortho Exam Exam of the left hip shows fully healed surgical scar.  Normal gait pattern. Imaging: XR Pelvis 1-2 Views Result Date: 08/10/2024 Stable left total hip replacement without complications    PMFS History: Patient Active Problem List   Diagnosis Date Noted   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   Primary osteoarthritis of left hip 06/22/2023   Prediabetes 04/25/2023   Arthralgia 01/17/2023   Secondary adrenal insufficiency 01/17/2023   Adrenal insufficiency due to corticosteroid withdrawal 04/22/2022   Sialadenitis 06/11/2021   Hx of adenomatous colonic polyps 04/06/2021   History of colon polyps 03/01/2020   Abnormal colonoscopy 03/01/2020   Recurrent urticaria/pruritus 09/21/2019   History of nasal polyposis 09/21/2019   Contact dermatitis 07/01/2019   Acute kidney  injury 08/08/2015   Hyperkalemia 08/08/2015   Acute pyelonephritis    Left ureteral stone 08/07/2015   Pyonephrosis 08/07/2015   Acute renal insufficiency 08/07/2015   Nonallopathic lesion of thoracic region 03/15/2015   Lateral epicondylitis of right elbow 02/22/2015   Obstructive sleep apnea 01/28/2015   Triceps tendinitis 01/11/2015   Back pain 12/21/2014   Anxiety state 02/06/2014   Thrush of mouth and esophagus (HCC) 06/20/2011   Sinusitis, maxillary, chronic 11/18/2010   ALLERGIC CONJUNCTIVITIS 08/25/2007   Perennial allergic rhinitis 08/25/2007   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) 08/25/2007   Past Medical History:  Diagnosis Date   Allergic rhinitis    Allergy     seasonal allergies   Angio-edema    Arthritis    Cancer (HCC)    basal cell-nose and left ear   Complication of anesthesia    difficulty waking up from lithotripsy in 2013. No issues since then.   COPD (chronic obstructive pulmonary disease) (HCC)    Glaucoma    has occasional pressure issues in eyes   History of chronic bronchitis    History of kidney stones    Left ureteral stone    Mild obstructive sleep apnea    per study 02-09-2015  recommended oral appliance / cpap-- pt tried intolerant   Moderate persistent asthma    pulmologist-  dr young   Pneumonia 1994   Pre-diabetes  Urticaria     Family History  Problem Relation Age of Onset   Asthma Father    Other Father        bronchitis   Allergic rhinitis Father    Diabetes Maternal Grandfather 30       Heavy smoker   Eczema Neg Hx    Immunodeficiency Neg Hx    Urticaria Neg Hx    Colon polyps Neg Hx    Colon cancer Neg Hx    Esophageal cancer Neg Hx    Rectal cancer Neg Hx    Stomach cancer Neg Hx    Inflammatory bowel disease Neg Hx    Liver disease Neg Hx    Pancreatic cancer Neg Hx     Past Surgical History:  Procedure Laterality Date   BIOPSY  07/18/2021   Procedure: BIOPSY;  Surgeon:  Wilhelmenia Aloha Raddle., MD;  Location: [facility];  Service: Gastroenterology;;   COLONOSCOPY  12/07/2019   polyps   COLONOSCOPY WITH PROPOFOL  N/A 05/08/2020   Procedure: COLONOSCOPY WITH PROPOFOL ;  Surgeon: Wilhelmenia Aloha Raddle., MD;  Location: [facility] END[facility]COPY;  Service: Gastroenterology;  Laterality: N/A;   COLONOSCOPY WITH PROPOFOL  N/A 07/18/2021   Procedure: COLONOSCOPY WITH PROPOFOL ;  Surgeon: Mansouraty, Aloha Raddle., MD;  Location: [facility];  Service: Gastroenterology;  Laterality: N/A;   CYSTO/  LEFT URETERAL STENT PLACEMENT  03/10/2002   CYSTOSCOPY W/ URETERAL STENT PLACEMENT Left 08/07/2015   Procedure: CYSTOSCOPY, Left Retrograde Pyelogram, Left Ureteral Stent Placement;  Surgeon: Norleen Seltzer, MD;  Location: [facility];  Service: Urology;  Laterality: Left;   CYSTOSCOPY/URETEROSCOPY/HOLMIUM LASER/STENT PLACEMENT Left 08/17/2015   Procedure: CYSTOSCOPY / LEFT URETEROSCOPY / HOLMIUM LASER LITHOTRIPSY;  Surgeon: Norleen Seltzer, MD;  Location: [facility];  Service: Urology;  Laterality: Left;   ENDOSCOPIC MUCOSAL RESECTION N/A 05/08/2020   Procedure: ENDOSCOPIC MUCOSAL RESECTION;  Surgeon: Wilhelmenia Aloha Raddle., MD;  Location: [facility];  Service: Gastroenterology;  Laterality: N/A;   ENDOSCOPIC MUCOSAL RESECTION N/A 07/18/2021   Procedure: ENDOSCOPIC MUCOSAL RESECTION;  Surgeon: Wilhelmenia Aloha Raddle., MD;  Location: [facility];  Service: Gastroenterology;  Laterality: N/A;   EXTRACORPOREAL SHOCK WAVE LITHOTRIPSY  2006   HEMOSTASIS CLIP PLACEMENT  05/08/2020   Procedure: HEMOSTASIS CLIP PLACEMENT;  Surgeon: Wilhelmenia Aloha Raddle., MD;  Location: [facility] ENDOSCOPY;  Service: Gastroenterology;;   POLYPECTOMY  05/08/2020   Procedure: POLYPECTOMY;  Surgeon: Wilhelmenia Aloha Raddle., MD;  Location: [facility] ENDOSCOPY;  Service: Gastroenterology;;   POLYPECTOMY  07/18/2021   Procedure: POLYPECTOMY;  Surgeon: Wilhelmenia Aloha Raddle., MD;  Location: [facility];  Service:  Gastroenterology;;   SEPTOPLASTY WITH ETHMOIDECTOMY, AND MAXILLARY ANTROSTOMY  09/24/2010   SINOSCOPY     STONE EXTRACTION WITH BASKET Left 08/17/2015   Procedure: STONE EXTRACTION WITH BASKET;  Surgeon: Norleen Seltzer, MD;  Location[facility];  Service: Urology;  Laterality: Left;   SUBMUCOSAL LIFTING INJECTION  05/08/2020   Procedure: SUBMUCOSAL LIFTING INJECTION;  Surgeon: Wilhelmenia Aloha Raddle., MD;  Location: [facility] END[facility]COPY;  Service: Gastroenterology;;   TOTAL HIP ARTHROPLASTY Left 06/23/2023   Procedure: LEFT TOTAL HIP ARTHROPLASTY ANTERIOR APPROACH;  Surgeon: Jerri Kay HERO, MD;  Location: [facility];  Service: Orthopedics;  Laterality: Left;  3-C   WISDOM TOOTH EXTRACTION  1984   Social History   Occupational History   Not on file  Tobacco Use   Smoking status: Never   Smokeless tobacco: Never  Vaping Use   Vaping status: Never Used  Substance and Sexual Activity   Alcohol use: Not  Currently   Drug use: Never   Sexual activity: Yes        "

## 2024-08-11 ENCOUNTER — Other Ambulatory Visit (HOSPITAL_COMMUNITY): Payer: Self-pay

## 2024-08-12 ENCOUNTER — Other Ambulatory Visit: Payer: Self-pay | Admitting: Internal Medicine

## 2024-08-12 DIAGNOSIS — E2749 Other adrenocortical insufficiency: Secondary | ICD-10-CM

## 2024-08-18 ENCOUNTER — Encounter: Payer: Self-pay | Admitting: Gastroenterology

## 2024-08-26 ENCOUNTER — Encounter: Payer: Self-pay | Admitting: Internal Medicine

## 2024-08-26 ENCOUNTER — Other Ambulatory Visit

## 2024-08-26 ENCOUNTER — Ambulatory Visit: Admitting: Internal Medicine

## 2024-08-26 VITALS — BP 148/82 | Ht 70.0 in | Wt 284.0 lb

## 2024-08-26 DIAGNOSIS — R7303 Prediabetes: Secondary | ICD-10-CM

## 2024-08-26 DIAGNOSIS — E2749 Other adrenocortical insufficiency: Secondary | ICD-10-CM

## 2024-08-26 MED ORDER — HYDROCORTISONE 10 MG PO TABS: 30.0000 mg | ORAL_TABLET | Freq: Every day | ORAL | 3 refills | Status: AC

## 2024-08-26 NOTE — Progress Notes (Unsigned)
 "   Name: [name] [name]  MRN/ DOB: [id number], 1966/04/14    Age/ Sex: 59 y.o., male    PCP: Jason Leita Repine, FNP (Inactive)   Reason for Endocrinology Evaluation: Adrenal insufficieny      Date of Initial Endocrinology Evaluation: 01/17/2023    HPI: [name] [name] is a 59 y.o. male with a past medical history of Asthma . The patient presented for initial endocrinology clinic visit on 08/26/2024 for consultative assistance with his adrenal insufficiency.   Pt has been referred here for further evaluation of joint pain and concerns about adrenal insufficiency  Pt received left hip injection 6/4/204  Pt with Asthma and eosinophilia , has been on glucocorticoids for years   On his initial visit to our clinic he was on prednisone  5 mg alternating 2.5 mg , I switched him to hydrocortisone   He is a marine scientist   ACTH  continues to be undetectable with a last checkup 2025  PREDIABETES:  He was also diagnosed with prediabetes 04/2023 with an A1c of 5.8% He was provided with #1 sample pen of Ozempic  at the time to help with prediabetes as well as weight loss prior to his hip surgery    SUBJECTIVE:    Today (08/26/24):[name] [name] is here for follow-up on adrenal insufficiency and prediabetes.  He is s/p left total hip replacement 06/23/2023, he continues to follow-up with Ortho care  Patient has been noted weight gain since his last visit here No headaches  No vision changes  No nausea  No loose stools or diarrhea  He avoids sugar sweetened beverages  No glucocorticoids other than HC  Uses inhalers twice a day  He is on Fasenra  Q 2 months    HC 5 mg, 3 tabs with Breakfast and 1 tab between 2-4 pm daily      HISTORY:  Past Medical History:  Past Medical History:  Diagnosis Date   Allergic rhinitis    Allergy     seasonal allergies   Angio-edema    Arthritis    Cancer (HCC)    basal cell-nose and left ear   Complication of anesthesia     difficulty waking up from lithotripsy in 2013. No issues since then.   COPD (chronic obstructive pulmonary disease) (HCC)    Glaucoma    has occasional pressure issues in eyes   History of chronic bronchitis    History of kidney stones    Left ureteral stone    Mild obstructive sleep apnea    per study 02-09-2015  recommended oral appliance / cpap-- pt tried intolerant   Moderate persistent asthma    pulmologist-  dr young   Pneumonia 1994   Pre-diabetes    Urticaria    Past Surgical History:  Past Surgical History:  Procedure Laterality Date   BIOPSY  07/18/2021   Procedure: BIOPSY;  Surgeon: Wilhelmenia Aloha Raddle., MD;  Location: [facility];  Service: Gastroenterology;;   COLONOSCOPY  12/07/2019   polyps   COLONOSCOPY WITH PROPOFOL  N/A 05/08/2020   Procedure: COLONOSCOPY WITH PROPOFOL ;  Surgeon: Wilhelmenia Aloha Raddle., MD;  Location: [facility]COPY;  Service: Gastroenterology;  Laterality: N/A;   COLONOSCOPY WITH PROPOFOL  N/A 07/18/2021   Procedure: COLONOSCOPY WITH PROPOFOL ;  Surgeon: Mansouraty, Aloha Raddle., MD;  Location: [facility];  Service: Gastroenterology;  Laterality: N/A;   CYSTO/  LEFT URETERAL STENT PLACEMENT  03/10/2002   CYSTOSCOPY W/ URETERAL STENT PLACEMENT Left 08/07/2015   Procedure: CYSTOSCOPY, Left Retrograde Pyelogram, Left Ureteral Stent Placement;  Surgeon: Norleen Seltzer, MD;  Location: [facility];  Service: Urology;  Laterality: Left;   CYSTOSCOPY/URETEROSCOPY/HOLMIUM LASER/STENT PLACEMENT Left 08/17/2015   Procedure: CYSTOSCOPY / LEFT URETEROSCOPY / HOLMIUM LASER LITHOTRIPSY;  Surgeon: Norleen Seltzer, MD;  Location: [facility];  Service: Urology;  Laterality: Left;   ENDOSCOPIC MUCOSAL RESECTION N/A 05/08/2020   Procedure: ENDOSCOPIC MUCOSAL RESECTION;  Surgeon: Wilhelmenia Aloha Raddle., MD;  Location: [facility] END[facility]COPY;  Service: Gastroenterology;  Laterality: N/A;   ENDOSCOPIC MUCOSAL RESECTION N/A 07/18/2021   Procedure: ENDOSCOPIC MUCOSAL RESECTION;   Surgeon: Wilhelmenia Aloha Raddle., MD;  Location: [facility];  Service: Gastroenterology;  Laterality: N/A;   EXTRACORPOREAL SHOCK WAVE LITHOTRIPSY  2006   HEMOSTASIS CLIP PLACEMENT  05/08/2020   Procedure: HEMOSTASIS CLIP PLACEMENT;  Surgeon: Wilhelmenia Aloha Raddle., MD;  Location: [facility] ENDOSCOPY;  Service: Gastroenterology;;   POLYPECTOMY  05/08/2020   Procedure: POLYPECTOMY;  Surgeon: Wilhelmenia Aloha Raddle., MD;  Location: [facility] ENDOSCOPY;  Service: Gastroenterology;;   POLYPECTOMY  07/18/2021   Procedure: POLYPECTOMY;  Surgeon: Wilhelmenia Aloha Raddle., MD;  Location: [facility];  Service: Gastroenterology;;   SEPTOPLASTY WITH ETHMOIDECTOMY, AND MAXILLARY ANTROSTOMY  09/24/2010   SINOSCOPY     STONE EXTRACTION WITH BASKET Left 08/17/2015   Procedure: STONE EXTRACTION WITH BASKET;  Surgeon: Norleen Seltzer, MD;  Location: [facility];  Service: Urology;  Laterality: Left;   SUBMUCOSAL LIFTING INJECTION  05/08/2020   Procedure: SUBMUCOSAL LIFTING INJECTION;  Surgeon: Wilhelmenia Aloha Raddle., MD;  Location: [facility] ENDOSCOPY;  Service: Gastroenterology;;   TOTAL HIP ARTHROPLASTY Left 06/23/2023   Procedure: LEFT TOTAL HIP ARTHROPLASTY ANTERIOR APPROACH;  Surgeon: Jerri Kay HERO, MD;  Location: [facility];  Service: Orthopedics;  Laterality: Left;  3-C   WISDOM TOOTH EXTRACTION  1984    Social History:  reports that he has never smoked. He has never used smokeless tobacco. He reports that he does not currently use alcohol. He reports that he does not use drugs. Family History: family history includes Allergic rhinitis in his father; Asthma in his father; Diabetes (age of onset: 61) in his maternal grandfather; Other in his father.   HOME MEDICATIONS: Allergies as of 08/26/2024   No Known Allergies      Medication List        Accurate as of August 26, 2024 11:06 AM. If you have any questions, ask your nurse or doctor.          4-WAY NASAL SPRAY NA Place 1-2 sprays into the nose  daily as needed (congestion).   AeroChamber MV inhaler Use as instructed   amoxicillin  500 MG tablet Commonly known as: AMOXIL  Take 4 tablets by mouth 1 hour prior to dental procedure.   budesonide -formoterol  160-4.5 MCG/ACT inhaler Commonly known as: Symbicort  USE 2 INHALATIONS BY MOUTH TWICE DAILY RINSE MOUTH AFTER USE   cholecalciferol 25 MCG (1000 UNIT) tablet Commonly known as: VITAMIN D3 Take 1,000 Units by mouth daily.   clorazepate  7.5 MG tablet Commonly known as: TRANXENE  TAKE 1 TO 2 TABLETS BY MOUTH AT  NIGHT AS NEEDED FOR SLEEP   Compressor Nebulizer Misc 1 Device by Does not apply route 4 (four) times daily as needed.   Fasenra  Pen 30 MG/ML prefilled autoinjector Generic drug: benralizumab  Inject 1 mL (30 mg total) into the skin every 8 (eight) weeks.   fexofenadine 180 MG tablet Commonly known as: ALLEGRA Take 180 mg by mouth daily.   fluticasone  50 MCG/ACT nasal spray Commonly known as: FLONASE  USE 2 SPRAYS IN BOTH NOSTRILS  DAILY   hydrocortisone  5 MG tablet Commonly known as: CORTEF  TAKE 1 TABLET(5 MG) BY MOUTH DAILY   ipratropium-albuterol  0.5-2.5 (3) MG/3ML Soln Commonly known as: DUONEB USE 1 VIAL VIA NEBULIZER 4 TIMES DAILY AS NEEDED   montelukast  10 MG tablet Commonly known as: SINGULAIR  Take 1 tablet (10 mg total) by mouth daily.   multivitamin with minerals Tabs tablet Take 1 tablet by mouth daily.   Proventil  HFA 108 (90 Base) MCG/ACT inhaler Generic drug: albuterol  Inhale 1-2 puffs into the lungs every 6 (six) hours as needed for wheezing or shortness of breath.   sildenafil  100 MG tablet Commonly known as: Viagra  Take 0.5-1 tablets (50-100 mg total) by mouth daily as needed for erectile dysfunction.          REVIEW OF SYSTEMS: A comprehensive ROS was conducted with the patient and is negative except as per HPI     OBJECTIVE:  VS: BP (!) 148/82   Ht 5' 10 (1.778 m)   Wt 284 lb (128.8 kg)   BMI 40.75 kg/m    Wt  Readings from Last 3 Encounters:  08/26/24 284 lb (128.8 kg)  05/06/24 277 lb 9.6 oz (125.9 kg)  03/02/24 274 lb (124.3 kg)   Body surface area is 2.52 meters squared.    EXAM: General: Pt appears well and is in NAD  Neck: General: Supple without adenopathy. Thyroid : Thyroid  size normal.  No goiter or nodules appreciated.  Lungs: Clear with good BS bilat   Heart: Auscultation: RRR.  Extremities:  BL LE: No pretibial edema   Mental Status: Judgment, insight: Intact Orientation: Oriented to time, place, and person Mood and affect: No depression, anxiety, or agitation     DATA REVIEWED:     ASSESSMENT/PLAN/RECOMMENDATIONS:   Adrenal Insufficiency :  - This is most likely secondary in nature due to long term glucocorticoid use  -He has been off prednisone  and on hydrocortisone  since 12/2022 -Historically his ACTH  has been undetectable  Medications : Continue  HC 5 mg, 3 tabs with Breakfast and 1 tab between 2-4 pm daily    2. Pre-Diabetes :  -A1c has normalized at 5.6% today - I have prescribed Ozempic  in the past but this is not covered by his insurance -Patient encouraged to continue lifestyle changes      Follow-up in 6 months    Signed electronically by: Stefano Redgie Butts, MD  [facility]  [facility] [address] [place] [place], [place] [address] Phone: [phone] FAX: [phone]   CC: Jason Leita Repine, FNP (Inactive) No address on file Phone: None Fax: None   Return to Endocrinology clinic as below: Future Appointments  Date Time Provider Department Center  08/26/2024 11:10 AM Brianni Manthe, Donell Redgie, MD [facility] None  09/21/2024 10:00 AM [facility] PREVISIT [address] [facility]  09/21/2024  2:40 PM Landy Honora LITTIE DEVONNA [facility] [address]  10/06/2024  8:30 AM Mansouraty, Aloha Raddle., MD [facility]  08/10/2025 10:45 AM Jerri Kay HERO, MD [facility] None         "

## 2024-08-26 NOTE — Patient Instructions (Signed)
ADRENAL INSUFFICIENCY SICK DAY RULES: ? ?Should you face an extreme emotional or physical stress such as trauma, surgery or acute illness, this will require extra steroid coverage so that the body can meet that stress.  ? ?Without increasing the steroid dose you may experience severe weakness, headache, dizziness, nausea and vomiting and possibly a more serious deterioration in health.  ?Typically the dose of steroids will only need to be increased for a couple of days if you have an illness that is transient and managed in the community.  ? ?If you are unable to take/absorb an increased dose of steroids orally because of vomiting or diarrhea, you will urgently require steroid injections and should present to an Emergency Department. ? ?The general advice for any serious illness is as follows: ?Double the normal daily steroid dose for up to 3 days if you have a temperature of more than 37.50C (99.50F) with signs of sickness, or severe emotional or physical distress ?Contact your primary care doctor and Endocrinologist if the illness worsens or it lasts for more than 3 days.  ?In cases of severe illness, urgent medical assistance should be promptly sought. ?If you experience vomiting/diarrhea or are unable to take steroids by mouth, please administer the Hydrocortisone injection kit and seek urgent medical help. ? ? ? ?

## 2024-08-27 ENCOUNTER — Ambulatory Visit: Payer: Self-pay | Admitting: Internal Medicine

## 2024-08-27 LAB — T4, FREE: Free T4: 1.1 ng/dL (ref 0.8–1.8)

## 2024-08-27 LAB — BASIC METABOLIC PANEL WITH GFR
BUN: 22 mg/dL (ref 7–25)
CO2: 30 mmol/L (ref 20–32)
Calcium: 9.4 mg/dL (ref 8.6–10.3)
Chloride: 103 mmol/L (ref 98–110)
Creat: 1.12 mg/dL (ref 0.70–1.30)
Glucose, Bld: 90 mg/dL (ref 65–139)
Potassium: 4.3 mmol/L (ref 3.5–5.3)
Sodium: 140 mmol/L (ref 135–146)
eGFR: 76 mL/min/{1.73_m2}

## 2024-08-27 LAB — HEMOGLOBIN A1C
Hgb A1c MFr Bld: 5.9 % — ABNORMAL HIGH
Mean Plasma Glucose: 123 mg/dL
eAG (mmol/L): 6.8 mmol/L

## 2024-08-27 LAB — TSH: TSH: 1.05 m[IU]/L (ref 0.40–4.50)

## 2024-09-21 ENCOUNTER — Encounter: Admitting: Physician Assistant

## 2024-09-21 ENCOUNTER — Encounter

## 2024-10-05 ENCOUNTER — Encounter: Admitting: Gastroenterology

## 2024-10-06 ENCOUNTER — Encounter: Admitting: Gastroenterology

## 2025-08-10 ENCOUNTER — Ambulatory Visit: Admitting: Orthopaedic Surgery

## 2025-08-23 ENCOUNTER — Ambulatory Visit: Admitting: Internal Medicine
